# Patient Record
Sex: Male | Born: 1935 | Race: Black or African American | Hispanic: No | Marital: Married | Smoking: Never smoker
Health system: Southern US, Community
[De-identification: ages and names within clinical notes are randomized; demographics above are authoritative.]

## PROBLEM LIST (undated history)

## (undated) DIAGNOSIS — I10 Essential (primary) hypertension: Secondary | ICD-10-CM

## (undated) DIAGNOSIS — M109 Gout, unspecified: Secondary | ICD-10-CM

## (undated) DIAGNOSIS — Z789 Other specified health status: Secondary | ICD-10-CM

## (undated) DIAGNOSIS — E785 Hyperlipidemia, unspecified: Secondary | ICD-10-CM

## (undated) DIAGNOSIS — G4733 Obstructive sleep apnea (adult) (pediatric): Secondary | ICD-10-CM

## (undated) DIAGNOSIS — R7303 Prediabetes: Secondary | ICD-10-CM

## (undated) HISTORY — DX: Other specified health status: Z78.9

## (undated) HISTORY — DX: Gout, unspecified: M10.9

## (undated) HISTORY — PX: OTHER SURGICAL HISTORY: SHX169

## (undated) HISTORY — DX: Essential (primary) hypertension: I10

## (undated) HISTORY — DX: Prediabetes: R73.03

## (undated) HISTORY — DX: Hyperlipidemia, unspecified: E78.5

## (undated) HISTORY — PX: CATARACT EXTRACTION, BILATERAL: SHX1313

## (undated) HISTORY — DX: Obstructive sleep apnea (adult) (pediatric): G47.33

---

## 1998-03-02 HISTORY — PX: ROTATOR CUFF REPAIR: SHX139

## 1999-05-06 ENCOUNTER — Encounter: Admission: RE | Admit: 1999-05-06 | Discharge: 1999-05-21 | Payer: Self-pay | Admitting: Family Medicine

## 1999-09-09 ENCOUNTER — Encounter: Payer: Self-pay | Admitting: Surgery

## 1999-09-11 ENCOUNTER — Ambulatory Visit (HOSPITAL_COMMUNITY): Admission: RE | Admit: 1999-09-11 | Discharge: 1999-09-11 | Payer: Self-pay | Admitting: General Surgery

## 1999-09-11 ENCOUNTER — Encounter (INDEPENDENT_AMBULATORY_CARE_PROVIDER_SITE_OTHER): Payer: Self-pay | Admitting: Specialist

## 2005-03-02 HISTORY — PX: OTHER SURGICAL HISTORY: SHX169

## 2005-03-02 HISTORY — PX: CARPAL TUNNEL RELEASE: SHX101

## 2005-04-29 ENCOUNTER — Ambulatory Visit (HOSPITAL_COMMUNITY): Admission: RE | Admit: 2005-04-29 | Discharge: 2005-04-29 | Payer: Self-pay | Admitting: Internal Medicine

## 2005-06-16 ENCOUNTER — Ambulatory Visit (HOSPITAL_BASED_OUTPATIENT_CLINIC_OR_DEPARTMENT_OTHER): Admission: RE | Admit: 2005-06-16 | Discharge: 2005-06-16 | Payer: Self-pay | Admitting: Otolaryngology

## 2005-06-16 ENCOUNTER — Encounter (INDEPENDENT_AMBULATORY_CARE_PROVIDER_SITE_OTHER): Payer: Self-pay | Admitting: *Deleted

## 2005-10-09 ENCOUNTER — Ambulatory Visit (HOSPITAL_COMMUNITY): Admission: RE | Admit: 2005-10-09 | Discharge: 2005-10-09 | Payer: Self-pay | Admitting: Neurosurgery

## 2007-07-20 ENCOUNTER — Encounter: Admission: RE | Admit: 2007-07-20 | Discharge: 2007-07-20 | Payer: Self-pay | Admitting: Orthopedic Surgery

## 2007-09-05 ENCOUNTER — Ambulatory Visit: Payer: Self-pay | Admitting: Gastroenterology

## 2007-10-11 ENCOUNTER — Encounter: Payer: Self-pay | Admitting: Gastroenterology

## 2007-10-11 ENCOUNTER — Telehealth: Payer: Self-pay | Admitting: Gastroenterology

## 2007-10-12 ENCOUNTER — Telehealth: Payer: Self-pay | Admitting: Gastroenterology

## 2007-10-13 ENCOUNTER — Encounter: Payer: Self-pay | Admitting: Gastroenterology

## 2007-10-13 ENCOUNTER — Ambulatory Visit: Payer: Self-pay | Admitting: Gastroenterology

## 2007-10-13 LAB — HM COLONOSCOPY

## 2007-10-18 ENCOUNTER — Encounter: Payer: Self-pay | Admitting: Gastroenterology

## 2008-07-25 ENCOUNTER — Encounter: Admission: RE | Admit: 2008-07-25 | Discharge: 2008-07-25 | Payer: Self-pay | Admitting: Internal Medicine

## 2009-02-12 ENCOUNTER — Encounter: Admission: RE | Admit: 2009-02-12 | Discharge: 2009-02-12 | Payer: Self-pay | Admitting: Orthopedic Surgery

## 2010-07-18 NOTE — Op Note (Signed)
[facility]  Patient:    Jeremy Boone, Jeremy Boone                      MRN: [id number] Proc. Date: 09/11/99 Adm. Date:  [id number] Attending:  Sonda Primes CC:         Mardene Celeste. Lurene Shadow, M.D. (2)                           Operative Report  PREOPERATIVE DIAGNOSIS:  Lipoma, right flank.  POSTOPERATIVE DIAGNOSIS:  Lipoma, right flank.  OPERATION PERFORMED:  Excision, lipoma, right flank.  SURGEON:  Mardene Celeste. Lurene Shadow, M.D.  ASSISTANT:  Nurse.  ANESTHESIA:  General.  INDICATIONS FOR PROCEDURE:  The patient is a 76 year old with a large and enlarging lipoma just over the posterior iliac crest.  It has been enlarging. The patient has been unable to get his clothing fitting correctly.  On clinical measurement, the lipoma measured about 12 x 9 cm in greatest diameters.  He is brought to the operating room now for excision.  DESCRIPTION OF PROCEDURE:  Following the induction of anesthesia, the patient was positioned supinely and then moved to the lateral recumbent position with the right side up.  The right flank was prepped and draped to be included in the sterile operative field.  I made a transverse incision over the mass above the iliac crest, deepened this through the skin and subcutaneous tissue, carrying the dissection down to the capsule of the mass.  The mass was multilobulated and multiple interstices were dissected free, carrying the dissection down to the flank musculature.  All of this was removed in its entirety and forwarded for pathologic evaluation measuring approximately 15 x 12 cm.  Hemostasis was assured with electrocautery and clamps and ties of 2-0 silk.  Subcutaneous tissues were closed with interrupted 3-0 Vicryl sutures.  Skin closed with a 4-0 Monocryl suture and then reinforced with Steri-Strips.  A sterile dressing was applied.  Anesthetic reversed.  Patient removed from the operating room to the recovery room in stable  condition having tolerated the procedure well. DD:  09/11/99 TD:  09/11/99 Job: [id number]

## 2010-07-18 NOTE — Op Note (Signed)
NAMEJESSE, NOSBISCH             ACCOUNT NO.:  [id number]   MEDICAL RECORD NO.:  [id number]          PATIENT TYPE:  AMB   LOCATION:  SDS                          FACILITY:  [facility]   PHYSICIAN:  Danae Orleans. Venetia Maxon, M.D.  DATE OF BIRTH:  03/27/1934   DATE OF PROCEDURE:  10/09/2005  DATE OF DISCHARGE:                                 OPERATIVE REPORT   PREOPERATIVE DIAGNOSIS:  Left carpal tunnel syndrome.   POSTOPERATIVE DIAGNOSIS:  Left carpal tunnel syndrome.   PROCEDURE:  Left carpal tunnel release.   SURGEON:  Danae Orleans. Venetia Maxon, M.D.   ANESTHESIA:  IV sedation with local anesthetic.   ESTIMATED BLOOD LOSS:  Minimal.   COMPLICATIONS:  None.   DISPOSITION:  Recovery.   INDICATIONS:  Jeremy Boone is a 76 year old mason with cervical  radiculopathy also fairly profound left carpal tunnel syndrome documented by  electrodiagnostic studies.  It was elected to take him to surgery for left  carpal tunnel release.   PROCEDURE:  Mr. Fitzmaurice was brought to the operating room.  He was given  intravenous sedation, and then his left hand and forearm were then prepped  and draped in the usual sterile fashion with Betadine scrub and paint in a  sterile stockinette and extremity drape.  His skin was marked from the  distal wrist crease proximal to the palm approximately 3 cm, and this was  infiltrated with local lidocaine on line with the fourth ray.  Incision was  made, carried through subcutaneous fat to the flexor retinaculum which was  then incised.  The flexor red retinaculum was opened both distally and more  proximally along the volar wrist into the forearm.  There appeared to be  some hypertrophy of the ligament but also some suggestion of tenosynovitis  with tissue overlying the median nerve with some inflammatory tissue  overlying the nerve.  It was felt that the median nerve was well  decompressed at this point.  Hemostasis ensured with bipolar electrocautery.  Wound was irrigated  and then closed with interrupted 3-0 nylon vertical  mattress stitches.  Sterile occlusive dressing was placed with bacitracin,  Telfa, gauze fluff, Kerlix and  Kling wrap.  At the end of surgery, the patient felt that he had more  feeling in his fingers.  He was moving his hand well with good opposition.  He was taken to recovery in stable and satisfactory condition, having  tolerated the procedure well.  Counts were correct at the end of the case.      Danae Orleans. Venetia Maxon, M.D.  Electronically Signed     JDS/MEDQ  D:  10/09/2005  T:  10/09/2005  Job:  [id number]

## 2010-07-18 NOTE — Op Note (Signed)
NAMEDELMORE, SEAR             ACCOUNT NO.:  [id number]   MEDICAL RECORD NO.:  [id number]          PATIENT TYPE:  AMB   LOCATION:  DSC                          FACILITY:  [facility]   PHYSICIAN:  Christopher E. Ezzard Standing, M.D.DATE OF BIRTH:  1934-09-19   DATE OF PROCEDURE:  06/16/2005  DATE OF DISCHARGE:                                 OPERATIVE REPORT   PREOPERATIVE DIAGNOSIS:  Right tonsil mass.   POSTOPERATIVE DIAGNOSIS:  Right tonsil mass.   OPERATION:  Direct laryngoscopy with excision of right tonsil mass.   SURGEON:  Kristine Garbe. Ezzard Standing, M.D.   ANESTHESIA:  General endotracheal.   COMPLICATIONS:  None.   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  He was referred to the office for further evaluation of the base of tongue  mass and on examination patient has a polypoid mass arising from the medial  inferior aspect of the right tonsil.  He is taken to the operating room at  this time for direct laryngoscopy and excision of right tonsil mass.   DESCRIPTION OF PROCEDURE:  After adequate endotracheal anesthesia, a direct  laryngoscopy was performed.  There was a polypoid mass measuring  approximately 2 to 2.5 cm arising from the inferior medial aspect of the  right tonsil.  The left tonsil appeared benign, base of tongue was clear.  Vallecula, epiglottis were normal.  Vocal cords, A-E folds and piriform  sinuses were all normal to evaluation.  After direct laryngoscopy, a mouth  was used to expose the oropharynx.  The medial aspect of the right tonsil  was excised with cautery.  The stalk of the mass arose from the medial  aspect of the tonsil and this was sent to pathology.  The mass measured  approximately 2.5 cm in size, had a thin stalk attached to the medial aspect  of the right tonsil.  Hemostasis  was obtained with cautery.  This completed  the procedure.  Palacios was awoken from anesthesia and transferred to the  recovery room postop doing well.   DISPOSITION:  Mcgregor is discharged home later this morning on amoxicillin  suspension 400 mg b.i.d. for 1 week, Tylenol, Lortab Elixir 1 tbsp q.4h.  p.r.n. pain.  Will have him followup in my office in 1 week for recheck and  review pathology.           ______________________________  Kristine Garbe Ezzard Standing, M.D.     CEN/MEDQ  D:  06/16/2005  T:  06/16/2005  Job:  [id number]   cc:   [name], M.D.  Fax: [phone]

## 2013-03-06 DIAGNOSIS — E782 Mixed hyperlipidemia: Secondary | ICD-10-CM | POA: Insufficient documentation

## 2013-03-06 DIAGNOSIS — M109 Gout, unspecified: Secondary | ICD-10-CM | POA: Insufficient documentation

## 2013-03-06 DIAGNOSIS — M1000 Idiopathic gout, unspecified site: Secondary | ICD-10-CM | POA: Insufficient documentation

## 2013-03-06 DIAGNOSIS — R7309 Other abnormal glucose: Secondary | ICD-10-CM | POA: Insufficient documentation

## 2013-03-06 DIAGNOSIS — I10 Essential (primary) hypertension: Secondary | ICD-10-CM | POA: Insufficient documentation

## 2013-03-06 DIAGNOSIS — E785 Hyperlipidemia, unspecified: Secondary | ICD-10-CM

## 2013-03-07 ENCOUNTER — Ambulatory Visit (INDEPENDENT_AMBULATORY_CARE_PROVIDER_SITE_OTHER): Payer: Medicare Other | Admitting: Physician Assistant

## 2013-03-07 ENCOUNTER — Encounter: Payer: Self-pay | Admitting: Physician Assistant

## 2013-03-07 VITALS — BP 122/72 | HR 62 | Temp 98.1°F | Resp 16 | Wt 186.0 lb

## 2013-03-07 DIAGNOSIS — E785 Hyperlipidemia, unspecified: Secondary | ICD-10-CM

## 2013-03-07 DIAGNOSIS — I10 Essential (primary) hypertension: Secondary | ICD-10-CM

## 2013-03-07 DIAGNOSIS — Z79899 Other long term (current) drug therapy: Secondary | ICD-10-CM

## 2013-03-07 DIAGNOSIS — E782 Mixed hyperlipidemia: Secondary | ICD-10-CM

## 2013-03-07 DIAGNOSIS — Z23 Encounter for immunization: Secondary | ICD-10-CM

## 2013-03-07 DIAGNOSIS — M109 Gout, unspecified: Secondary | ICD-10-CM

## 2013-03-07 DIAGNOSIS — R7309 Other abnormal glucose: Secondary | ICD-10-CM

## 2013-03-07 DIAGNOSIS — R7303 Prediabetes: Secondary | ICD-10-CM

## 2013-03-07 MED ORDER — HYDROCHLOROTHIAZIDE 25 MG PO TABS: 25.0000 mg | ORAL_TABLET | Freq: Every day | ORAL | Status: DC

## 2013-03-07 MED ORDER — ATENOLOL 100 MG PO TABS: 100.0000 mg | ORAL_TABLET | Freq: Every day | ORAL | Status: DC

## 2013-03-07 NOTE — Patient Instructions (Signed)

## 2013-03-07 NOTE — Progress Notes (Signed)
HPI Patient presents for 3 month follow up with hypertension, hyperlipidemia, prediabetes and vitamin D. Patient's blood pressure has been controlled at home, today their BP is BP: 122/72 mmHg  Patient denies chest pain, shortness of breath, dizziness.  Patient's cholesterol is diet controlled. The cholesterol last visit was LDL 138, Trig 155.  The patient has been working on diet and exercise for prediabetes, and denies changes in vision, polys, and paresthesias. A1C 6.1.  Patient is on Vitamin D supplement.  Vitamin D 55.  Current Medications:  Current Outpatient Prescriptions on File Prior to Visit  Medication Sig Dispense Refill  . allopurinol (ZYLOPRIM) 300 MG tablet Take 300 mg by mouth daily. 1/2 tablet      . atenolol (TENORMIN) 100 MG tablet Take 100 mg by mouth daily.      . Cholecalciferol (VITAMIN D PO) Take 5,000 Int'l Units by mouth daily.      . clonazePAM (KLONOPIN) 2 MG tablet Take 2 mg by mouth daily. 1/2 to 1  tablet at bedtime      . Flaxseed, Linseed, (FLAX SEED OIL PO) Take by mouth daily.      . hydrochlorothiazide (HYDRODIURIL) 25 MG tablet Take 25 mg by mouth daily.      . Omega-3 Fatty Acids (FISH OIL PO) Take by mouth daily.      . Sildenafil Citrate (VIAGRA PO) Take by mouth as needed.       No current facility-administered medications on file prior to visit.   Medical History:  Past Medical History  Diagnosis Date  . Hyperlipidemia   . Hypertension   . Gout   . Prediabetes    Allergies:  Allergies  Allergen Reactions  . Ace Inhibitors Cough  . Lipitor [Atorvastatin]     Elevates CPK and Aldolase    ROS Constitutional: Denies fever, chills, headaches, insomnia, fatigue, night sweats Eyes: Denies redness, blurred vision, diplopia, discharge, itchy, watery eyes.  ENT: Denies congestion, post nasal drip, sore throat, earache, dental pain, Tinnitus, Vertigo, Sinus pain, snoring.  Cardio: Denies chest pain, palpitations, irregular heartbeat, dyspnea,  diaphoresis, orthopnea, PND, claudication, edema Respiratory: denies cough, shortness of breath, wheezing.  Gastrointestinal: + GERD worse with fatty foods and hot peppers Denies dysphagia, heartburn, AB pain/ cramps, N/V, diarrhea, constipation, hematemesis, melena, hematochezia,  hemorrhoids Genitourinary: Denies dysuria, frequency, urgency, nocturia, hesitancy, discharge, hematuria, flank pain Musculoskeletal: Denies myalgia, stiffness, pain, swelling and strain/sprain. Skin: Denies pruritis, rash, changing in skin lesion Neuro: Denies Weakness, tremor, incoordination, spasms, pain Psychiatric: Denies confusion, memory loss, sensory loss Endocrine: Denies change in weight, skin, hair change, nocturia Diabetic Polys, Denies visual blurring, hyper /hypo glycemic episodes, and paresthesia, Heme/Lymph: Denies Excessive bleeding, bruising, enlarged lymph nodes  Family history- Review and unchanged Social history- Review and unchanged Physical Exam: Filed Vitals:   03/07/13 1715  BP: 122/72  Pulse: 62  Temp: 98.1 F (36.7 C)  Resp: 16   Filed Weights   03/07/13 1715  Weight: 186 lb (84.369 kg)   General Appearance: Well nourished, in no apparent distress. Eyes: PERRLA, EOMs, conjunctiva no swelling or erythema Sinuses: No Frontal/maxillary tenderness ENT/Mouth: Ext aud canals clear, TMs without erythema, bulging. No erythema, swelling, or exudate on post pharynx.  Tonsils not swollen or erythematous. Hearing normal.  Neck: Supple, thyroid normal.  Respiratory: Respiratory effort normal, BS equal bilaterally without rales, rhonchi, wheezing or stridor.  Cardio: RRR with no MRGs. Brisk peripheral pulses without edema.  Abdomen: Soft, + BS.  Non tender, no guarding, rebound,  hernias, masses. Lymphatics: Non tender without lymphadenopathy.  Musculoskeletal: Full ROM, 5/5 strength, normal gait.  Skin: Warm, dry without rashes, lesions, ecchymosis.  Neuro: Cranial nerves intact. Normal  muscle tone, no cerebellar symptoms. Sensation intact.  Psych: Awake and oriented X 3, normal affect, Insight and Judgment appropriate.   Assessment and Plan:  Hypertension: Continue medication, monitor blood pressure at home.  Continue DASH diet. Cholesterol: Continue diet and exercise. Check cholesterol.  Pre-diabetes-Continue diet and exercise. Check A1C Vitamin D Def- check level and continue medications.  GERD - will give print out, if it gets worse get PPI/follow up in office.   Continue diet and meds as discussed. Further disposition pending results of labs.  Quentin Mullingollier, Stayce Delancy 5:16 PM

## 2013-03-08 LAB — HEMOGLOBIN A1C
Hgb A1c MFr Bld: 6.3 % — ABNORMAL HIGH (ref <5.7)
Mean Plasma Glucose: 134 mg/dL — ABNORMAL HIGH (ref <117)

## 2013-03-08 LAB — CBC WITH DIFFERENTIAL/PLATELET
BASOS ABS: 0.1 10*3/uL (ref 0.0–0.1)
BASOS PCT: 1 % (ref 0–1)
Eosinophils Absolute: 0.6 10*3/uL (ref 0.0–0.7)
Eosinophils Relative: 10 % — ABNORMAL HIGH (ref 0–5)
HEMATOCRIT: 44.1 % (ref 39.0–52.0)
HEMOGLOBIN: 15.1 g/dL (ref 13.0–17.0)
LYMPHS PCT: 45 % (ref 12–46)
Lymphs Abs: 2.5 10*3/uL (ref 0.7–4.0)
MCH: 30.6 pg (ref 26.0–34.0)
MCHC: 34.2 g/dL (ref 30.0–36.0)
MCV: 89.3 fL (ref 78.0–100.0)
MONO ABS: 0.6 10*3/uL (ref 0.1–1.0)
MONOS PCT: 11 % (ref 3–12)
NEUTROS ABS: 1.8 10*3/uL (ref 1.7–7.7)
NEUTROS PCT: 33 % — AB (ref 43–77)
Platelets: 254 10*3/uL (ref 150–400)
RBC: 4.94 MIL/uL (ref 4.22–5.81)
RDW: 14.2 % (ref 11.5–15.5)
WBC: 5.5 10*3/uL (ref 4.0–10.5)

## 2013-03-08 LAB — HEPATIC FUNCTION PANEL
ALK PHOS: 76 U/L (ref 39–117)
ALT: 13 U/L (ref 0–53)
AST: 25 U/L (ref 0–37)
Albumin: 4.3 g/dL (ref 3.5–5.2)
BILIRUBIN DIRECT: 0.1 mg/dL (ref 0.0–0.3)
BILIRUBIN INDIRECT: 0.3 mg/dL (ref 0.0–0.9)
BILIRUBIN TOTAL: 0.4 mg/dL (ref 0.3–1.2)
Total Protein: 7.3 g/dL (ref 6.0–8.3)

## 2013-03-08 LAB — BASIC METABOLIC PANEL WITH GFR
BUN: 22 mg/dL (ref 6–23)
CHLORIDE: 100 meq/L (ref 96–112)
CO2: 28 mEq/L (ref 19–32)
Calcium: 9.5 mg/dL (ref 8.4–10.5)
Creat: 0.97 mg/dL (ref 0.50–1.35)
GFR, EST AFRICAN AMERICAN: 87 mL/min
GFR, EST NON AFRICAN AMERICAN: 76 mL/min
Glucose, Bld: 93 mg/dL (ref 70–99)
POTASSIUM: 3.7 meq/L (ref 3.5–5.3)
SODIUM: 137 meq/L (ref 135–145)

## 2013-03-08 LAB — MAGNESIUM: MAGNESIUM: 2 mg/dL (ref 1.5–2.5)

## 2013-03-08 LAB — LIPID PANEL
CHOL/HDL RATIO: 3.8 ratio
Cholesterol: 251 mg/dL — ABNORMAL HIGH (ref 0–200)
HDL: 66 mg/dL (ref >39)
LDL CALC: 161 mg/dL — AB (ref 0–99)
TRIGLYCERIDES: 120 mg/dL (ref <150)
VLDL: 24 mg/dL (ref 0–40)

## 2013-03-08 LAB — TSH: TSH: 3.746 u[IU]/mL (ref 0.350–4.500)

## 2013-06-05 ENCOUNTER — Ambulatory Visit: Payer: Self-pay | Admitting: Emergency Medicine

## 2013-06-08 ENCOUNTER — Ambulatory Visit (INDEPENDENT_AMBULATORY_CARE_PROVIDER_SITE_OTHER): Payer: Medicare Other | Admitting: Emergency Medicine

## 2013-06-08 ENCOUNTER — Encounter: Payer: Self-pay | Admitting: Emergency Medicine

## 2013-06-08 VITALS — BP 126/68 | HR 66 | Temp 98.2°F | Resp 16 | Ht 71.5 in | Wt 185.0 lb

## 2013-06-08 DIAGNOSIS — Z789 Other specified health status: Secondary | ICD-10-CM

## 2013-06-08 DIAGNOSIS — E559 Vitamin D deficiency, unspecified: Secondary | ICD-10-CM

## 2013-06-08 DIAGNOSIS — Z0000 Encounter for general adult medical examination without abnormal findings: Secondary | ICD-10-CM

## 2013-06-08 DIAGNOSIS — R7309 Other abnormal glucose: Secondary | ICD-10-CM

## 2013-06-08 DIAGNOSIS — Z1331 Encounter for screening for depression: Secondary | ICD-10-CM

## 2013-06-08 DIAGNOSIS — R1310 Dysphagia, unspecified: Secondary | ICD-10-CM

## 2013-06-08 DIAGNOSIS — E782 Mixed hyperlipidemia: Secondary | ICD-10-CM

## 2013-06-08 DIAGNOSIS — I10 Essential (primary) hypertension: Secondary | ICD-10-CM

## 2013-06-08 LAB — CBC WITH DIFFERENTIAL/PLATELET
BASOS PCT: 1 % (ref 0–1)
Basophils Absolute: 0.1 10*3/uL (ref 0.0–0.1)
EOS ABS: 0.6 10*3/uL (ref 0.0–0.7)
Eosinophils Relative: 11 % — ABNORMAL HIGH (ref 0–5)
HCT: 42.1 % (ref 39.0–52.0)
Hemoglobin: 14.4 g/dL (ref 13.0–17.0)
LYMPHS ABS: 2.5 10*3/uL (ref 0.7–4.0)
Lymphocytes Relative: 48 % — ABNORMAL HIGH (ref 12–46)
MCH: 30.5 pg (ref 26.0–34.0)
MCHC: 34.2 g/dL (ref 30.0–36.0)
MCV: 89.2 fL (ref 78.0–100.0)
Monocytes Absolute: 0.5 10*3/uL (ref 0.1–1.0)
Monocytes Relative: 9 % (ref 3–12)
NEUTROS ABS: 1.6 10*3/uL — AB (ref 1.7–7.7)
NEUTROS PCT: 31 % — AB (ref 43–77)
PLATELETS: 251 10*3/uL (ref 150–400)
RBC: 4.72 MIL/uL (ref 4.22–5.81)
RDW: 14.3 % (ref 11.5–15.5)
WBC: 5.3 10*3/uL (ref 4.0–10.5)

## 2013-06-08 LAB — HEMOGLOBIN A1C
HEMOGLOBIN A1C: 6.1 % — AB (ref <5.7)
MEAN PLASMA GLUCOSE: 128 mg/dL — AB (ref <117)

## 2013-06-08 NOTE — Progress Notes (Signed)
Patient ID: Jeremy Boone, male   DOB: 12-23-1934, 79 y.o.   MRN: [id number] Subjective:  Jeremy JewsFitzroy Millar is a 79 y.o. male who presents for Medicare Annual Wellness Visit and 3 month follow up for HTN, hyperlipidemia, prediabetes, and vitamin D Def.  Date of last medicare wellness visit was is unknown.  He has noted mild increased difficulty with choking. He denies specific food triggers and notes feels like trouble is in upper esophagus. He always has hoarse voice and denies increase in reflux.   His blood pressure has been controlled at home, today their BP is BP: 126/68 mmHg He does workout. He denies chest pain, shortness of breath, dizziness.  He is not on cholesterol medication and denies myalgias. His cholesterol is not at goal. The cholesterol last visit was:   Lab Results  Component Value Date   CHOL 219* 06/08/2013   HDL 68 06/08/2013   LDLCALC 409130* 06/08/2013   TRIG 104 06/08/2013   CHOLHDL 3.2 06/08/2013   He has been working on diet and exercise for prediabetes, and denies polydipsia and polyuria. Last A1C in the office was:  Lab Results  Component Value Date   HGBA1C 6.1* 06/08/2013   Patient is on Vitamin D supplement.   No components found with this basename: VITD25OH     Names of Other Physician/Practitioners you currently use: Patient Care Team: Lucky CowboyWilliam McKeown, MD as PCP - General (Internal Medicine) Lamar Benesharles R Epes, MD as Consulting Physician (Ophthalmology) Drucilla SchmidtJames P Aplington, MD as Consulting Physician (Orthopedic Surgery) Louis Meckelobert D Kaplan, MD as Consulting Physician (Gastroenterology)  Medication Review: Current Outpatient Prescriptions on File Prior to Visit  Medication Sig Dispense Refill  . allopurinol (ZYLOPRIM) 300 MG tablet Take 300 mg by mouth daily. 1/2 tablet      . atenolol (TENORMIN) 100 MG tablet Take 1 tablet (100 mg total) by mouth daily.  90 tablet  1  . Cholecalciferol (VITAMIN D PO) Take 5,000 Int'l Units by mouth daily.      . clonazePAM (KLONOPIN)  2 MG tablet Take 2 mg by mouth daily. 1/2 to 1  tablet at bedtime      . Flaxseed, Linseed, (FLAX SEED OIL PO) Take by mouth daily.      . hydrochlorothiazide (HYDRODIURIL) 25 MG tablet Take 1 tablet (25 mg total) by mouth daily.  90 tablet  1  . Omega-3 Fatty Acids (FISH OIL PO) Take by mouth daily.      . Sildenafil Citrate (VIAGRA PO) Take by mouth as needed.       No current facility-administered medications on file prior to visit.   Allergies  Allergen Reactions  . Ace Inhibitors Cough  . Lipitor [Atorvastatin]     Elevates CPK and Aldolase    Current Problems (verified) Patient Active Problem List   Diagnosis Date Noted  . Hyperlipidemia   . Hypertension   . Gout   . Prediabetes     Screening Tests Health Maintenance  Topic Date Due  . Zostavax  03/18/1996  . Influenza Vaccine  09/30/2013  . Tetanus/tdap  03/02/2014  . Colonoscopy  10/12/2017  . Pneumococcal Polysaccharide Vaccine Age 77 And Over  Completed    Immunization History  Administered Date(s) Administered  . Influenza,inj,Quad PF,36+ Mos 03/07/2013  . Pneumococcal-Unspecified 03/03/2007  . Td 03/02/2004    Preventative care: Last colonoscopy: 8/13/09DUE 2019, polyps, diverticulosis CXR: 2007  Prior vaccinations: TD or Tdap: 2006 Influenza: 03/07/13 Pneumococcal: 2009 Shingles/Zostavax: Declines due to cost   History reviewed:  Past Medical History  Diagnosis Date  . Hyperlipidemia   . Hypertension   . Gout   . Prediabetes    Past Surgical History  Procedure Laterality Date  . Carpal tunnel release Left 2007  . Rotator cuff repair Left 2000  . Other surgical history  2007    Negative biopsy tonsil mass   History  Substance Use Topics  . Smoking status: Never Smoker   . Smokeless tobacco: Never Used  . Alcohol Use: Yes   Family History  Problem Relation Age of Onset  . Hypertension Mother   . Stroke Father   . Seizures Sister     Risk Factors: Tobacco History  Substance  Use Topics  . Smoking status: Never Smoker   . Smokeless tobacco: Never Used  . Alcohol Use: Yes   He does not smoke.  Patient is not a former smoker. Are there smokers in your home (other than you)?  No  Alcohol Current alcohol use: 2 liquor drinks per day(s)  Caffeine Current caffeine use: coffee 2 /day  Exercise Current exercise habits: Home exercise routine includes walking .5 hrs per day.  Current exercise: housecleaning, walking and yard work  Nutrition/Diet Current diet: in general, a "healthy" diet    Cardiac risk factors: advanced age (older than 34 for men, 87 for women), hypertension and male gender.  Depression Screen Nurse depression screen reviewed.  (Note: if answer to either of the following is "Yes", a more complete depression screening is indicated)   Q1: Over the past two weeks, have you felt down, depressed or hopeless? No  Q2: Over the past two weeks, have you felt little interest or pleasure in doing things? No  Have you lost interest or pleasure in daily life? No  Do you often feel hopeless? No  Do you cry easily over simple problems? No  Activities of Daily Living Nurse ADLs screen reviewed.  In your present state of health, do you have any difficulty performing the following activities?:  Driving? No Managing money?  No Feeding yourself? No Getting from bed to chair? No Climbing a flight of stairs? No Preparing food and eating?: No Bathing or showering? No Getting dressed: No Getting to the toilet? No Using the toilet:No Moving around from place to place: No In the past year have you fallen or had a near fall?:No   Are you sexually active?  Yes  Do you have more than one partner?  No  Vision Difficulties: No  Hearing Difficulties: No Do you often ask people to speak up or repeat themselves? No Do you experience ringing or noises in your ears? No Do you have difficulty understanding soft or whispered voices? No  Cognition  Do you  feel that you have a problem with memory?No  Do you often misplace items? No  Do you feel safe at home?  Yes  Advanced directives Does patient have a Health Care Power of Attorney? No Does patient have a Living Will? No   Objective:     Vision and hearing screens reviewed.   Blood pressure 126/68, pulse 66, temperature 98.2 F (36.8 C), temperature source Temporal, resp. rate 16, height 5' 11.5" (1.816 m), weight 185 lb (83.915 kg). Body mass index is 25.45 kg/(m^2).  General appearance: alert, no distress, WD/WN, male Cognitive Testing  Alert? Yes  Normal Appearance?Yes  Oriented to person? Yes  Place? Yes   Time? Yes  Recall of three objects?  Yes  Can perform simple calculations? Yes  Displays appropriate judgment?Yes  Can read the correct time from a watch face?Yes  HEENT: normocephalic, sclerae anicteric, TMs pearly, nares patent, no discharge or erythema, pharynx normal Oral cavity: MMM, no lesions Neck: supple, no lymphadenopathy, no thyromegaly, no masses Heart: RRR, normal S1, S2, no murmurs Lungs: CTA bilaterally, no wheezes, rhonchi, or rales Abdomen: +bs, soft, non tender, non distended, no masses, no hepatomegaly, no splenomegaly Musculoskeletal: nontender, no swelling, no obvious deformity Extremities: no edema, no cyanosis, no clubbing Pulses: 2+ symmetric, upper and lower extremities, normal cap refill Skin: Exposed area WNL Neurological: alert, oriented x 3, CN2-12 intact, strength normal upper extremities and lower extremities, sensation normal throughout, DTRs 2+ throughout, no cerebellar signs, gait normal Psychiatric: normal affect, behavior normal, pleasant   Assessment:  1.Medicare Wellness update- Update screening labs/ History/ Immunizations/ Testing as needed. Advised healthy diet, QD exercise, increase H20 and continue RX/ Vitamins AD.  2. 3 month F/U for HTN, Cholesterol, Pre-Dm, D. Deficient. Needs healthy diet, cardio QD and obtain healthy  weight. Check Labs, Check BP if >130/80 call office  3.Dysphagia- REF GI, follow GERD Diet, no ETOH   Plan:   During the course of the visit the patient was educated and counseled about appropriate screening and preventive services including:    Nutrition counseling   Screening recommendations, referrals: ALL FOLLOWING UP TO DATE OR DECLINES  Vaccinations: Tdap vaccine no  Influenza vaccine no Pneumococcal vaccine no Shingles vaccine no Hep B vaccine no  Nutrition assessed and recommended  Colonoscopy no Recommended yearly ophthalmology/optometry visit for glaucoma screening and checkup Recommended yearly dental visit for hygiene and checkup Advanced directives - yes  Conditions/risks identified: BMI: Discussed weight loss, diet, and increase physical activity.  Increase physical activity: AHA recommends 150 minutes of physical activity a week.  Medications reviewed Diabetes is at goal, ACE/ARB therapy: No, Reason not on Ace Inhibitor/ARB therapy:  IS not a diabetic Urinary Incontinence is not an issue: discussed non pharmacology and pharmacology options.  Fall risk: low- discussed PT, home fall assessment, medications.    Medicare Attestation I have personally reviewed: The patient's medical and social history Their use of alcohol, tobacco or illicit drugs Their current medications and supplements The patient's functional ability including ADLs,fall risks, home safety risks, cognitive, and hearing and visual impairment Diet and physical activities Evidence for depression or mood disorders  The patient's weight, height, BMI, and visual acuity have been recorded in the chart.  I have made referrals, counseling, and provided education to the patient based on review of the above and I have provided the patient with a written personalized care plan for preventive services.     Berenice Primas, PA-C   06/12/2013   CPT Z6109 first AWV CPT [id number] subsequent AWV

## 2013-06-08 NOTE — Patient Instructions (Signed)
Bad carbs also include fruit juice, alcohol, and sweet tea. These are empty calories that do not signal to your brain that you are full.   Please remember the good carbs are still carbs which convert into sugar. So please measure them out no more than 1/2-1 cup of rice, oatmeal, pasta, and beans.  Veggies are however free foods! Pile them on.   I like lean protein at every meal such as chicken, [place], pork chops, cottage cheese, etc. Just do not fry these meats and please center your meal around vegetable, the meats should be a side dish.   No all fruit is created equal. Please see the list below, the fruit at the bottom is higher in sugars than the fruit at the top   We want weight loss that will last so you should lose 1-2 pounds a week.  THAT IS IT! Please pick THREE things a month to change. Once it is a habit check off the item. Then pick another three items off the list to become habits.  If you are already doing a habit on the list GREAT!  Cross that item off! o Don't drink your calories. Ie, alcohol, soda, fruit juice, and sweet tea.  o Drink more water. Drink a glass when you feel hungry or before each meal.  o Eat breakfast - Complex carb and protein (likeDannon light and fit yogurt, oatmeal, fruit, eggs, [place] bacon). o Measure your cereal.  Eat no more than one cup a day. (ie [place]) o Eat an apple a day. o Add a vegetable a day. o Try a new vegetable a month. o Use Pam! Stop using oil or butter to cook. o Don't finish your plate or use smaller plates. o Share your dessert. o Eat sugar free Jello for dessert or frozen grapes. o Don't eat 2-3 hours before bed. o Switch to whole wheat bread, pasta, and brown rice. o Make healthier choices when you eat out. No fries! o Pick baked chicken, NOT fried. o Don't forget to SLOW DOWN when you eat. It is not going anywhere.  o Take the stairs. o Park far away in the parking lot o [employer] (or weights) for 10 minutes while  watching TV. o Walk at work for 10 minutes during break. o Walk outside 1 time a week with your friend, kids, dog, or significant other. o Start a walking group at church. o Walk the mall as much as you can tolerate.  o Keep a food diary. o Weigh yourself daily. o Walk for 15 minutes 3 days per week. o Cook at home more often and eat out less.  If life happens and you go back to old habits, it is okay.  Just start over. You can do it!   If you experience chest pain, get short of breath, or tired during the exercise, please stop immediately and inform your doctor.  Diet for Gastroesophageal Reflux Disease, Adult Reflux is when stomach acid flows up into the esophagus. The esophagus becomes irritated and sore (inflammation). When reflux happens often and is severe, it is called gastroesophageal reflux disease (GERD). What you eat can help ease any discomfort caused by GERD. FOODS OR DRINKS TO AVOID OR LIMIT  Coffee and black tea, with or without caffeine.  Bubbly (carbonated) drinks with caffeine or energy drinks.  Strong spices, such as pepper, cayenne pepper, curry, or chili powder.  Peppermint or spearmint.  Chocolate.  High-fat foods, such as meats, fried food,  oils, butter, or nuts.  Fruits and vegetables that cause discomfort. This includes citrus fruits and tomatoes.  Alcohol. If a certain food or drink irritates your GERD, avoid eating or drinking it. THINGS THAT MAY HELP GERD INCLUDE:  Eat meals slowly.  Eat 5 to 6 small meals a day, not 3 large meals.  Do not eat food for a certain amount of time if it causes discomfort.  Wait 3 hours after eating before lying down.  Keep the head of your bed raised 6 to 9 inches (15 23 centimeters). Put a foam wedge or blocks under the legs of the bed.  Stay active. Weight loss, if needed, may help ease your discomfort.  Wear loose-fitting clothing.  Do not smoke or chew tobacco. Document Released: 08/18/2011 Document  Reviewed: 08/18/2011 [facility] Patient Information 2014 [place], [place].

## 2013-06-09 LAB — HEPATIC FUNCTION PANEL
ALBUMIN: 3.8 g/dL (ref 3.5–5.2)
ALK PHOS: 74 U/L (ref 39–117)
ALT: 14 U/L (ref 0–53)
AST: 30 U/L (ref 0–37)
BILIRUBIN INDIRECT: 0.3 mg/dL (ref 0.2–1.2)
Bilirubin, Direct: 0.1 mg/dL (ref 0.0–0.3)
TOTAL PROTEIN: 7 g/dL (ref 6.0–8.3)
Total Bilirubin: 0.4 mg/dL (ref 0.2–1.2)

## 2013-06-09 LAB — BASIC METABOLIC PANEL WITH GFR
BUN: 17 mg/dL (ref 6–23)
CALCIUM: 9.3 mg/dL (ref 8.4–10.5)
CO2: 28 meq/L (ref 19–32)
Chloride: 97 mEq/L (ref 96–112)
Creat: 0.95 mg/dL (ref 0.50–1.35)
GFR, Est African American: 89 mL/min
GFR, Est Non African American: 77 mL/min
Glucose, Bld: 94 mg/dL (ref 70–99)
POTASSIUM: 3.6 meq/L (ref 3.5–5.3)
SODIUM: 135 meq/L (ref 135–145)

## 2013-06-09 LAB — MAGNESIUM: Magnesium: 1.8 mg/dL (ref 1.5–2.5)

## 2013-06-09 LAB — LIPID PANEL
Cholesterol: 219 mg/dL — ABNORMAL HIGH (ref 0–200)
HDL: 68 mg/dL (ref >39)
LDL CALC: 130 mg/dL — AB (ref 0–99)
Total CHOL/HDL Ratio: 3.2 Ratio
Triglycerides: 104 mg/dL (ref <150)
VLDL: 21 mg/dL (ref 0–40)

## 2013-06-09 LAB — VITAMIN D 25 HYDROXY (VIT D DEFICIENCY, FRACTURES): VIT D 25 HYDROXY: 100 ng/mL — AB (ref 30–89)

## 2013-06-09 LAB — INSULIN, FASTING: INSULIN FASTING, SERUM: 16 u[IU]/mL (ref 3–28)

## 2013-06-13 ENCOUNTER — Encounter: Payer: Self-pay | Admitting: Gastroenterology

## 2013-08-02 ENCOUNTER — Ambulatory Visit (INDEPENDENT_AMBULATORY_CARE_PROVIDER_SITE_OTHER): Payer: Medicare Other | Admitting: Gastroenterology

## 2013-08-02 ENCOUNTER — Encounter: Payer: Self-pay | Admitting: Gastroenterology

## 2013-08-02 VITALS — BP 122/70 | HR 64 | Ht 71.5 in | Wt 185.0 lb

## 2013-08-02 DIAGNOSIS — R1310 Dysphagia, unspecified: Secondary | ICD-10-CM

## 2013-08-02 DIAGNOSIS — Z86010 Personal history of colonic polyps: Secondary | ICD-10-CM

## 2013-08-02 DIAGNOSIS — K219 Gastro-esophageal reflux disease without esophagitis: Secondary | ICD-10-CM | POA: Insufficient documentation

## 2013-08-02 MED ORDER — OMEPRAZOLE 20 MG PO CPDR: 20.0000 mg | DELAYED_RELEASE_CAPSULE | Freq: Every day | ORAL | Status: DC

## 2013-08-02 NOTE — Assessment & Plan Note (Signed)
I will ask pathology to review the slides to determine whether the  polyp is a sessile serrated

## 2013-08-02 NOTE — Progress Notes (Signed)
_                                                                                                                History of Present Illness:  79 year old Afro-American male referred for evaluation of dysphagia.  He has intermittent dysphagia to solids.  With swallowing he often will cough.  He also has spontaneous coughing and he feels that he may get choked on his saliva.  When he bends over he has regurgitation of gastric contents and he complains of pyrosis.    Past Medical History  Diagnosis Date  . Hyperlipidemia   . Hypertension   . Gout   . Prediabetes    Past Surgical History  Procedure Laterality Date  . Carpal tunnel release Left 2007  . Rotator cuff repair Left 2000  . Other surgical history  2007    Negative biopsy tonsil mass   family history includes Hypertension in his mother; Seizures in his sister; Stroke in his father. Current Outpatient Prescriptions  Medication Sig Dispense Refill  . allopurinol (ZYLOPRIM) 300 MG tablet Take 300 mg by mouth daily. 1/2 tablet      . atenolol (TENORMIN) 100 MG tablet Take 1 tablet (100 mg total) by mouth daily.  90 tablet  1  . Cholecalciferol (VITAMIN D PO) Take 5,000 Int'l Units by mouth daily.      . clonazePAM (KLONOPIN) 2 MG tablet Take 2 mg by mouth daily. 1/2 to 1  tablet at bedtime      . Flaxseed, Linseed, (FLAX SEED OIL PO) Take by mouth daily.      Marland Kitchen gemfibrozil (LOPID) 600 MG tablet       . hydrochlorothiazide (HYDRODIURIL) 25 MG tablet Take 1 tablet (25 mg total) by mouth daily.  90 tablet  1  . Omega-3 Fatty Acids (FISH OIL PO) Take by mouth daily.      . Sildenafil Citrate (VIAGRA PO) Take by mouth as needed.       No current facility-administered medications for this visit.   Allergies as of 08/02/2013 - Review Complete 08/02/2013  Allergen Reaction Noted  . Ace inhibitors Cough 03/06/2013  . Lipitor [atorvastatin]  03/06/2013    reports that he has never smoked. He has never used smokeless  tobacco. He reports that he drinks alcohol. He reports that he does not use illicit drugs.     Review of Systems: Pertinent positive and negative review of systems were noted in the above HPI section. All other review of systems were otherwise negative.  Vital signs were reviewed in today's medical record Physical Exam: General: Well developed , well nourished, no acute distress Skin: anicteric Head: Normocephalic and atraumatic Eyes:  sclerae anicteric, EOMI Ears: Normal auditory acuity Mouth: No deformity or lesions Neck: Supple, no masses or thyromegaly Lungs: Clear throughout to auscultation Heart: Regular rate and rhythm; no murmurs, rubs or bruits Abdomen: Soft, non tender and non distended. No masses, hepatosplenomegaly or hernias noted. Normal Bowel sounds Rectal:deferred Musculoskeletal: Symmetrical with no gross  deformities  Skin: No lesions on visible extremities Pulses:  Normal pulses noted Extremities: No clubbing, cyanosis, edema or deformities noted Neurological: Alert oriented x 4, grossly nonfocal Cervical Nodes:  No significant cervical adenopathy Inguinal Nodes: No significant inguinal adenopathy Psychological:  Alert and cooperative. Normal mood and affect  See Assessment and Plan under Problem List

## 2013-08-02 NOTE — Patient Instructions (Signed)

## 2013-08-02 NOTE — Assessment & Plan Note (Signed)
Dysphagia may be do to an esophageal stricture.  A motility disorder must also be considered.  Recommendations #1 upper endoscopy with dilation as indicated #2 to consider swallowing study pending results of above

## 2013-08-02 NOTE — Assessment & Plan Note (Signed)
Plan to begin omeprazole 20 mg daily

## 2013-08-03 ENCOUNTER — Encounter: Payer: Self-pay | Admitting: Gastroenterology

## 2013-08-03 ENCOUNTER — Ambulatory Visit (AMBULATORY_SURGERY_CENTER): Payer: Medicare Other | Admitting: Gastroenterology

## 2013-08-03 VITALS — BP 123/64 | HR 46 | Temp 96.4°F | Resp 16 | Ht 71.5 in | Wt 185.0 lb

## 2013-08-03 DIAGNOSIS — K209 Esophagitis, unspecified without bleeding: Secondary | ICD-10-CM

## 2013-08-03 DIAGNOSIS — R1310 Dysphagia, unspecified: Secondary | ICD-10-CM

## 2013-08-03 MED ORDER — SODIUM CHLORIDE 0.9 % IV SOLN: 500.0000 mL | INTRAVENOUS | Status: DC

## 2013-08-03 NOTE — Patient Instructions (Signed)
Dr. Marzetta Board office will arrange a Barium Esophagram.  Continue your Omeprazole.   YOU HAD AN ENDOSCOPIC PROCEDURE TODAY AT THE [facility]: Refer to the procedure report that was given to you for any specific questions about what was found during the examination.  If the procedure report does not answer your questions, please call your gastroenterologist to clarify.  If you requested that your care partner not be given the details of your procedure findings, then the procedure report has been included in a sealed envelope for you to review at your convenience later.  YOU SHOULD EXPECT: Some feelings of bloating in the abdomen. Passage of more gas than usual.  Walking can help get rid of the air that was put into your GI tract during the procedure and reduce the bloating. If you had a lower endoscopy (such as a colonoscopy or flexible sigmoidoscopy) you may notice spotting of blood in your stool or on the toilet paper. If you underwent a bowel prep for your procedure, then you may not have a normal bowel movement for a few days.  DIET: Your first meal following the procedure should be a light meal and then it is ok to progress to your normal diet.  A half-sandwich or bowl of soup is an example of a good first meal.  Heavy or fried foods are harder to digest and may make you feel nauseous or bloated.  Likewise meals heavy in dairy and vegetables can cause extra gas to form and this can also increase the bloating.  Drink plenty of fluids but you should avoid alcoholic beverages for 24 hours.  ACTIVITY: Your care partner should take you home directly after the procedure.  You should plan to take it easy, moving slowly for the rest of the day.  You can resume normal activity the day after the procedure however you should NOT DRIVE or use heavy machinery for 24 hours (because of the sedation medicines used during the test).    SYMPTOMS TO REPORT IMMEDIATELY: A gastroenterologist can be reached  at any hour.  During normal business hours, 8:30 AM to 5:00 PM Monday through Friday, call [phone].  After hours and on weekends, please call the GI answering service at [phone] who will take a message and have the physician on call contact you.  Following upper endoscopy (EGD)  Vomiting of blood or coffee ground material  New chest pain or pain under the shoulder blades  Painful or persistently difficult swallowing  New shortness of breath  Fever of 100F or higher  Black, tarry-looking stools  FOLLOW UP: If any biopsies were taken you will be contacted by phone or by letter within the next 1-3 weeks.  Call your gastroenterologist if you have not heard about the biopsies in 3 weeks.  Our staff will call the home number listed on your records the next business day following your procedure to check on you and address any questions or concerns that you may have at that time regarding the information given to you following your procedure. This is a courtesy call and so if there is no answer at the home number and we have not heard from you through the emergency physician on call, we will assume that you have returned to your regular daily activities without incident.  SIGNATURES/CONFIDENTIALITY: You and/or your care partner have signed paperwork which will be entered into your electronic medical record.  These signatures attest to the fact that that the information above on  your After Visit Summary has been reviewed and is understood.  Full responsibility of the confidentiality of this discharge information lies with you and/or your care-partner. 

## 2013-08-03 NOTE — Progress Notes (Signed)
A/ox3, pleased with MAC, report to RN 

## 2013-08-03 NOTE — Op Note (Addendum)
[facility] [address] [place], [address]   ENDOSCOPY PROCEDURE REPORT  PATIENT: Jeremy Boone, Jeremy Boone  MR#: [id number] BIRTHDATE: 1934/06/16 , 79  yrs. old GENDER: Male ENDOSCOPIST: Louis Meckel, MD REFERRED BY:  [name], M.D. PROCEDURE DATE:  08/03/2013 PROCEDURE:  EGD, diagnostic ASA CLASS:     Class II INDICATIONS:  Dysphagia. MEDICATIONS: MAC sedation, administered by CRNA and propofol (Diprivan) 200mg  IV TOPICAL ANESTHETIC:  DESCRIPTION OF PROCEDURE: After the risks benefits and alternatives of the procedure were thoroughly explained, informed consent was obtained.  The [id number] endoscope was introduced through the mouth and advanced to the third portion of the duodenum. Without limitations.  The instrument was slowly withdrawn as the mucosa was fully examined.      The upper, middle and distal third of the esophagus were carefully inspected.  There was at least one erosion extending 2 cm proximally from the GE junction.. The esophagus may have been somewhat patulous. The z-line was well seen at the GEJ.  The endoscope was pushed into the fundus which was normal including a retroflexed view.  The antrum, gastric body, first and second part of the duodenum were unremarkable.  Retroflexed views revealed no abnormalities.     The scope was then withdrawn from the patient and the procedure completed.  COMPLICATIONS: There were no complications.  ENDOSCOPIC IMPRESSION: 1.  erosive esophagitis 2.  ? patulous esophagus)  RECOMMENDATIONS: My office will arrange for you to have a Barium Esophagram performed.  This is a radiology test to examine your esophagus. continue omeprazole  REPEAT EXAM:  eSigned:  Louis Meckel, MD 08/03/2013 4:33 PM Revised: 08/03/2013 4:33 PM  CC:  PATIENT NAME:  [name], [name] MR#: [id number]

## 2013-08-04 ENCOUNTER — Telehealth: Payer: Self-pay

## 2013-08-04 ENCOUNTER — Other Ambulatory Visit: Payer: Self-pay

## 2013-08-04 DIAGNOSIS — R1319 Other dysphagia: Secondary | ICD-10-CM

## 2013-08-04 NOTE — Telephone Encounter (Signed)
Invalid phone number.

## 2013-08-04 NOTE — Telephone Encounter (Signed)
Pt scheduled for Barium esophagram at [facility] 08/09/13@11 :30am. Pt to arrive there at 11:15am and be NPO after 8:30am. Pt aware of appt.

## 2013-08-08 NOTE — Telephone Encounter (Signed)
Attempt follow up call 08-04-2013. Contact number invalid.

## 2013-08-09 ENCOUNTER — Ambulatory Visit (HOSPITAL_COMMUNITY)
Admission: RE | Admit: 2013-08-09 | Discharge: 2013-08-09 | Disposition: A | Discharge status: Home / Self Care | Payer: Medicare Other | Source: Ambulatory Visit | Attending: Gastroenterology | Admitting: Gastroenterology

## 2013-08-09 DIAGNOSIS — K224 Dyskinesia of esophagus: Secondary | ICD-10-CM | POA: Insufficient documentation

## 2013-08-09 DIAGNOSIS — K449 Diaphragmatic hernia without obstruction or gangrene: Secondary | ICD-10-CM | POA: Insufficient documentation

## 2013-08-09 DIAGNOSIS — R1319 Other dysphagia: Secondary | ICD-10-CM

## 2013-08-09 DIAGNOSIS — K219 Gastro-esophageal reflux disease without esophagitis: Secondary | ICD-10-CM | POA: Insufficient documentation

## 2013-08-09 NOTE — Progress Notes (Signed)
Quick Note:  Please inform the patient that xray did not show significant abnormalities and to continue current plan of action ______

## 2013-08-27 ENCOUNTER — Other Ambulatory Visit: Payer: Self-pay | Admitting: Internal Medicine

## 2013-08-31 ENCOUNTER — Telehealth: Payer: Self-pay | Admitting: Gastroenterology

## 2013-08-31 NOTE — Telephone Encounter (Signed)
Pt states he thought he was told he had an infection in his esophagus. Pt questioning if he needs to be on an antibiotic. Dr. Arlyce DiceKaplan please advise.

## 2013-09-02 NOTE — Telephone Encounter (Signed)
He is mistaken.  No infection.  Needs f/u OV

## 2013-09-04 NOTE — Telephone Encounter (Signed)
Pt scheduled to see Dr. Arlyce DiceKaplan 11/07/13@2 :30pm. Pt aware of appt.

## 2013-09-14 ENCOUNTER — Ambulatory Visit (INDEPENDENT_AMBULATORY_CARE_PROVIDER_SITE_OTHER): Payer: Medicare Other | Admitting: Internal Medicine

## 2013-09-14 ENCOUNTER — Encounter: Payer: Self-pay | Admitting: Internal Medicine

## 2013-09-14 VITALS — BP 108/70 | HR 56 | Temp 97.7°F | Resp 16 | Ht 71.0 in | Wt 184.4 lb

## 2013-09-14 DIAGNOSIS — E559 Vitamin D deficiency, unspecified: Secondary | ICD-10-CM

## 2013-09-14 DIAGNOSIS — I10 Essential (primary) hypertension: Secondary | ICD-10-CM

## 2013-09-14 DIAGNOSIS — Z789 Other specified health status: Secondary | ICD-10-CM

## 2013-09-14 DIAGNOSIS — M109 Gout, unspecified: Secondary | ICD-10-CM

## 2013-09-14 DIAGNOSIS — R7303 Prediabetes: Secondary | ICD-10-CM

## 2013-09-14 DIAGNOSIS — Z1331 Encounter for screening for depression: Secondary | ICD-10-CM

## 2013-09-14 DIAGNOSIS — Z0000 Encounter for general adult medical examination without abnormal findings: Secondary | ICD-10-CM

## 2013-09-14 DIAGNOSIS — Z125 Encounter for screening for malignant neoplasm of prostate: Secondary | ICD-10-CM

## 2013-09-14 DIAGNOSIS — Z1212 Encounter for screening for malignant neoplasm of rectum: Secondary | ICD-10-CM

## 2013-09-14 DIAGNOSIS — E785 Hyperlipidemia, unspecified: Secondary | ICD-10-CM

## 2013-09-14 DIAGNOSIS — Z79899 Other long term (current) drug therapy: Secondary | ICD-10-CM | POA: Insufficient documentation

## 2013-09-14 LAB — CBC WITH DIFFERENTIAL/PLATELET
Basophils Absolute: 0.1 10*3/uL (ref 0.0–0.1)
Basophils Relative: 1 % (ref 0–1)
EOS PCT: 10 % — AB (ref 0–5)
Eosinophils Absolute: 0.6 10*3/uL (ref 0.0–0.7)
HEMATOCRIT: 42.6 % (ref 39.0–52.0)
HEMOGLOBIN: 14.5 g/dL (ref 13.0–17.0)
LYMPHS ABS: 2.8 10*3/uL (ref 0.7–4.0)
Lymphocytes Relative: 50 % — ABNORMAL HIGH (ref 12–46)
MCH: 30.6 pg (ref 26.0–34.0)
MCHC: 34 g/dL (ref 30.0–36.0)
MCV: 89.9 fL (ref 78.0–100.0)
MONO ABS: 0.5 10*3/uL (ref 0.1–1.0)
Monocytes Relative: 9 % (ref 3–12)
NEUTROS ABS: 1.7 10*3/uL (ref 1.7–7.7)
Neutrophils Relative %: 30 % — ABNORMAL LOW (ref 43–77)
Platelets: 233 10*3/uL (ref 150–400)
RBC: 4.74 MIL/uL (ref 4.22–5.81)
RDW: 14.1 % (ref 11.5–15.5)
WBC: 5.6 10*3/uL (ref 4.0–10.5)

## 2013-09-14 NOTE — Patient Instructions (Addendum)
Recommend the book "The END of DIETING" by Dr Nyra Market   and the book "The END of DIABETES " by Dr Monico Hoar  At [email or web address] - get book & Audio CD's      Being diabetic has a  300% increased risk for heart attack, stroke, cancer, and alzheimer- type vascular dementia. It is very important that you work harder with diet by avoiding all foods that are white except chicken & fish. Avoid white rice (brown & wild rice is OK), white potatoes (sweetpotatoes in moderation is OK), White bread or wheat bread or anything made out of white flour like bagels, donuts, rolls, buns, biscuits, cakes, pastries, cookies, pizza crust, and pasta (made from white flour & egg whites) - vegetarian pasta or spinach or wheat pasta is OK. Multigrain breads like Arnold's or Pepper[employer], or multigrain sandwich thins or flatbreads.  Diet, exercise and weight loss can reverse and cure diabetes in the early stages.  Diet, exercise and weight loss is very important in the control and prevention of complications of diabetes which affects every system in your body, ie. Brain - dementia/stroke, eyes - glaucoma/blindness, heart - heart attack/heart failure, kidneys - dialysis, stomach - gastric paralysis, intestines - malabsorption, nerves - severe painful neuritis, circulation - gangrene & loss of a leg(s), and finally cancer and Alzheimers.    I recommend avoid fried & greasy foods,  sweets/candy, white rice (brown or wild rice or Quinoa is OK), white potatoes (sweet potatoes are OK) - anything made from white flour - bagels, doughnuts, rolls, buns, biscuits,white and wheat breads, pizza crust and traditional pasta made of white flour & egg white(vegetarian pasta or spinach or wheat pasta is OK).  Multi-grain bread is OK - like multi-grain flat bread or sandwich thins. Avoid alcohol in excess. Exercise is also important.    Eat all the vegetables you want - avoid meat, especially red meat and dairy - especially cheese.   Cheese is the most concentrated form of trans-fats which is the worst thing to clog up our arteries. Veggie cheese is OK which can be found in the fresh produce section at [facility] or [employer] or [employer]   Preventative Care for Adults, Male       REGULAR HEALTH EXAMS:  A routine yearly physical is a good way to check in with your primary care provider about your health and preventive screening. It is also an opportunity to share updates about your health and any concerns you have, and receive a thorough all-over exam.   Most health insurance companies pay for at least some preventative services.  Check with your health plan for specific coverages.  WHAT PREVENTATIVE SERVICES DO MEN NEED?  Adult men should have their weight and blood pressure checked regularly.   Men age 70 and older should have their cholesterol levels checked regularly.  Beginning at age 10 and continuing to age 31, men should be screened for colorectal cancer.  Certain people should may need continued testing until age 49.  Other cancer screening may include exams for testicular and prostate cancer.  Updating vaccinations is part of preventative care.  Vaccinations help protect against diseases such as the flu.  Lab tests are generally done as part of preventative care to screen for anemia and blood disorders, to screen for problems with the kidneys and liver, to screen for bladder problems, to check blood sugar, and to check your cholesterol level.  Preventative services generally include counseling about diet, exercise,  avoiding tobacco, drugs, excessive alcohol consumption, and sexually transmitted infections.    GENERAL RECOMMENDATIONS FOR GOOD HEALTH:  Healthy diet:  Eat a variety of foods, including fruit, vegetables, animal or vegetable protein, such as meat, fish, chicken, and eggs, or beans, lentils, tofu, and grains, such as rice.  Drink plenty of water daily.  Decrease saturated fat in the  diet, avoid lots of red meat, processed foods, sweets, fast foods, and fried foods.  Exercise:  Aerobic exercise helps maintain good heart health. At least 30-40 minutes of moderate-intensity exercise is recommended. For example, a brisk walk that increases your heart rate and breathing. This should be done on most days of the week.   Find a type of exercise or a variety of exercises that you enjoy so that it becomes a part of your daily life.  Examples are running, walking, swimming, water aerobics, and biking.  For motivation and support, explore group exercise such as aerobic class, spin class, Zumba, Yoga,or  martial arts, etc.    Set exercise goals for yourself, such as a certain weight goal, walk or run in a race such as a 5k walk/run.  Speak to your primary care provider about exercise goals.  Disease prevention:  If you smoke or chew tobacco, find out from your caregiver how to quit. It can literally save your life, no matter how long you have been a tobacco user. If you do not use tobacco, never begin.   Maintain a healthy diet and normal weight. Increased weight leads to problems with blood pressure and diabetes.   The Body Mass Index or BMI is a way of measuring how much of your body is fat. Having a BMI above 27 increases the risk of heart disease, diabetes, hypertension, stroke and other problems related to obesity. Your caregiver can help determine your BMI and based on it develop an exercise and dietary program to help you achieve or maintain this important measurement at a healthful level.  High blood pressure causes heart and blood vessel problems.  Persistent high blood pressure should be treated with medicine if weight loss and exercise do not work.   Fat and cholesterol leaves deposits in your arteries that can block them. This causes heart disease and vessel disease elsewhere in your body.  If your cholesterol is found to be high, or if you have heart disease or certain other  medical conditions, then you may need to have your cholesterol monitored frequently and be treated with medication.   Ask if you should have a stress test if your history suggests this. A stress test is a test done on a treadmill that looks for heart disease. This test can find disease prior to there being a problem.  Avoid drinking alcohol in excess (more than two drinks per day).  Avoid use of street drugs. Do not share needles with anyone. Ask for professional help if you need assistance or instructions on stopping the use of alcohol, cigarettes, and/or drugs.  Brush your teeth twice a day with fluoride toothpaste, and floss once a day. Good oral hygiene prevents tooth decay and gum disease. The problems can be painful, unattractive, and can cause other health problems. Visit your dentist for a routine oral and dental check up and preventive care every 6-12 months.   Look at your skin regularly.  Use a mirror to look at your back. Notify your caregivers of changes in moles, especially if there are changes in shapes, colors, a size larger than  a pencil eraser, an irregular border, or development of new moles.  Safety:  Use seatbelts 100% of the time, whether driving or as a passenger.  Use safety devices such as hearing protection if you work in environments with loud noise or significant background noise.  Use safety glasses when doing any work that could send debris in to the eyes.  Use a helmet if you ride a bike or motorcycle.  Use appropriate safety gear for contact sports.  Talk to your caregiver about gun safety.  Use sunscreen with a SPF (or skin protection factor) of 15 or greater.  Lighter skinned people are at a greater risk of skin cancer. Don't forget to also wear sunglasses in order to protect your eyes from too much damaging sunlight. Damaging sunlight can accelerate cataract formation.   Practice safe sex. Use condoms. Condoms are used for birth control and to help reduce the spread  of sexually transmitted infections (or STIs).  Some of the STIs are gonorrhea (the clap), chlamydia, syphilis, trichomonas, herpes, HPV (human papilloma virus) and HIV (human immunodeficiency virus) which causes AIDS. The herpes, HIV and HPV are viral illnesses that have no cure. These can result in disability, cancer and death.   Keep carbon monoxide and smoke detectors in your home functioning at all times. Change the batteries every 6 months or use a model that plugs into the wall.   Vaccinations:  Stay up to date with your tetanus shots and other required immunizations. You should have a booster for tetanus every 10 years. Be sure to get your flu shot every year, since 5%-20% of the [place] population comes down with the flu. The flu vaccine changes each year, so being vaccinated once is not enough. Get your shot in the fall, before the flu season peaks.   Other vaccines to consider:  Pneumococcal vaccine to protect against certain types of pneumonia.  This is normally recommended for adults age 4 or older.  However, adults younger than 77 years old with certain underlying conditions such as diabetes, heart or lung disease should also receive the vaccine.  Shingles vaccine to protect against Varicella Zoster if you are older than age 36, or younger than 77 years old with certain underlying illness.  Hepatitis A vaccine to protect against a form of infection of the liver by a virus acquired from food.  Hepatitis B vaccine to protect against a form of infection of the liver by a virus acquired from blood or body fluids, particularly if you work in health care.  If you plan to travel internationally, check with your local health department for specific vaccination recommendations.  Cancer Screening:  Most routine colon cancer screening begins at the age of 39. On a yearly basis, doctors may provide special easy to use take-home tests to check for hidden blood in the stool. Sigmoidoscopy or  colonoscopy can detect the earliest forms of colon cancer and is life saving. These tests use a small camera at the end of a tube to directly examine the colon. Speak to your caregiver about this at age 28, when routine screening begins (and is repeated every 5 years unless early forms of pre-cancerous polyps or small growths are found).   At the age of 63 men usually start screening for prostate cancer every year. Screening may begin at a younger age for those with higher risk. Those at higher risk include African-Americans or having a family history of prostate cancer. There are two types of tests for  prostate cancer:   Prostate-specific antigen (PSA) testing. Recent studies raise questions about prostate cancer using PSA and you should discuss this with your caregiver.   Digital rectal exam (in which your doctor's lubricated and gloved finger feels for enlargement of the prostate through the anus).   Screening for testicular cancer.  Do a monthly exam of your testicles. Gently roll each testicle between your thumb and fingers, feeling for any abnormal lumps. The best time to do this is after a hot shower or bath when the tissues are looser. Notify your caregivers of any lumps, tenderness or changes in size or shape immediately.

## 2013-09-14 NOTE — Progress Notes (Signed)
Patient ID: Jeremy Boone, male   DOB: November 27, 1934, 79 y.o.   MRN: [id number]   Annual Screening Comprehensive Examination  This very nice 79 y.o.male presents for complete physical.  Patient has been followed for HTN,  Prediabetes, Hyperlipidemia, and Vitamin D Deficiency.   HTN predates since 1998. Patient's BP has been controlled at home.Today's BP: 108/70 mmHg. Patient denies any cardiac symptoms as chest pain, palpitations, shortness of breath, dizziness or ankle swelling.   Patient is allergic to statins and his  hyperlipidemia is not controlled with diet and medications. Patient denies myalgias or other medication SE's. Last Lipids in April were not at goal.  Lab Results  Component Value Date   CHOL 219* 06/08/2013   HDL 68 06/08/2013   LDLCALC 409* 06/08/2013   TRIG 104 06/08/2013   CHOLHDL 3.2 06/08/2013    Patient has prediabetes since 07/2009 with A1c 6.3% and last A1c was 6.1% in Apr 2015. Patient denies reactive hypoglycemic symptoms, visual blurring, diabetic polys or paresthesias.    Finally, patient has history of Vitamin D Deficiency of 25 in 2008 and last vitamin D was 100 in Apr 2015.   Medication Sig  . allopurinol (ZYLOPRIM) 300 MG tablet Take 300 mg by mouth daily. 1/2 tablet  . atenolol (TENORMIN) 100 MG tablet Take 1 tablet (100 mg total) by mouth daily.  . Cholecalciferol (VITAMIN D PO) Take 5,000 Int'l Units by mouth. Takes every other day  . clonazePAM (KLONOPIN) 2 MG tablet TAKE 1 TABLET BY MOUTH AT BEDTIME AS NEEDED FOR SLEEP  . Flaxseed, Linseed, (FLAX SEED OIL PO) Take by mouth 2 (two) times daily.   Marland Kitchen gemfibrozil (LOPID) 600 MG tablet Takes 1 daily  . hydrochlorothiazide (HYDRODIURIL) 25 MG tablet Take 1 tablet (25 mg total) by mouth daily.  . Omega-3 Fatty Acids (FISH OIL PO) Take by mouth 2 (two) times daily.   Marland Kitchen omeprazole (PRILOSEC) 20 MG capsule Take 1 capsule (20 mg total) by mouth daily.  . Sildenafil Citrate (VIAGRA PO) Take by mouth as needed.    Allergies  Allergen Reactions  . Ace Inhibitors Cough  . Lipitor [Atorvastatin]     Elevates CPK and Aldolase   Past Medical History  Diagnosis Date  . Hyperlipidemia   . Hypertension   . Gout   . Prediabetes    Past Surgical History  Procedure Laterality Date  . Carpal tunnel release Left 2007  . Rotator cuff repair Left 2000  . Other surgical history  2007    Negative biopsy tonsil mass   Family History  Problem Relation Age of Onset  . Hypertension Mother   . Stroke Father   . Seizures Sister    History   Social History  . Marital Status: Married    Spouse Name: N/A    Number of Children: N/A  . Years of Education: N/A   Occupational History  . Retired Corporate investment banker for [employer].   Social History Main Topics  . Smoking status: Never Smoker   . Smokeless tobacco: Never Used  . Alcohol Use: 7.5 oz/week    15 drink(s) per week  . Drug Use: No  . Sexual Activity: Not on file    ROS Constitutional: Denies fever, chills, weight loss/gain, headaches, insomnia, fatigue, night sweats or change in appetite. Eyes: Denies redness, blurred vision, diplopia, discharge, itchy or watery eyes.  ENT: Denies discharge, congestion, post nasal drip, epistaxis, sore throat, earache, hearing loss, dental pain, Tinnitus, Vertigo, Sinus pain or snoring.  Cardio: Denies chest pain, palpitations, irregular heartbeat, syncope, dyspnea, diaphoresis, orthopnea, PND, claudication or edema Respiratory: denies cough, dyspnea, DOE, pleurisy, hoarseness, laryngitis or wheezing.  Gastrointestinal: Denies dysphagia, heartburn, reflux, water brash, pain, cramps, nausea, vomiting, bloating, diarrhea, constipation, hematemesis, melena, hematochezia, jaundice or hemorrhoids Genitourinary: Denies dysuria, frequency, urgency, nocturia, hesitancy, discharge, hematuria or flank pain Musculoskeletal: Denies arthralgia, myalgia, stiffness, Jt. Swelling, pain, limp or strain/sprain.  Denies falls. Skin: Denies puritis, rash, hives, warts, acne, eczema or change in skin lesion Neuro: No weakness, tremor, incoordination, spasms, paresthesia or pain Psychiatric: Denies confusion, memory loss or sensory loss. Denies depression. Endocrine: Denies change in weight, skin, hair change, nocturia, and paresthesia, diabetic polys, visual blurring or hyper / hypo glycemic episodes.  Heme/Lymph: No excessive bleeding, bruising or enlarged lymph nodes.  Physical Exam  BP 108/70  Pulse 56  Temp(Src) 97.7 F (36.5 C) (Temporal)  Resp 16  Ht 5\' 11"  (1.803 m)  Wt 184 lb 6.4 oz (83.643 kg)  BMI 25.73 kg/m2  General Appearance: Well nourished, in no apparent distress. Eyes: PERRLA, EOMs, conjunctiva no swelling or erythema, normal fundi and vessels. Sinuses: No frontal/maxillary tenderness ENT/Mouth: EACs patent / TMs  nl. Nares clear without erythema, swelling, mucoid exudates. Oral hygiene is good. No erythema, swelling, or exudate. Tongue normal, non-obstructing. Tonsils not swollen or erythematous. Hearing normal.  Neck: Supple, thyroid normal. No bruits, nodes or JVD. Respiratory: Respiratory effort normal.  BS equal and clear bilateral without rales, rhonci, wheezing or stridor. Cardio: Heart sounds are normal with regular rate and rhythm and no murmurs, rubs or gallops. Peripheral pulses are normal and equal bilaterally without edema. No aortic or femoral bruits. Chest: symmetric with normal excursions and percussion.  Abdomen: Flat, soft, with bowl sounds. Nontender, no guarding, rebound, hernias, masses, or organomegaly.  Lymphatics: Non tender without lymphadenopathy.  Genitourinary: No hernias.Testes nl. DRE - prostate nl for age - smooth & firm w/o nodules. Musculoskeletal: Full ROM all peripheral extremities, joint stability, 5/5 strength, and normal gait. Skin: Warm and dry without rashes, lesions, cyanosis, clubbing or  ecchymosis.  Neuro: Cranial nerves intact,  reflexes equal bilaterally. Normal muscle tone, no cerebellar symptoms. Sensation intact.  Pysch: Awake and oriented X 3with normal affect, insight and judgment appropriate.   Assessment and Plan  1. Annual Screening Examination 2. Hypertension  3. Hyperlipidemia 4. Pre Diabetes 5. Vitamin D Deficiency 6. GERD  Continue prudent diet as discussed, weight control, BP monitoring, regular exercise, and medications as discussed.  Discussed med effects and SE's. Routine screening labs and tests as requested with regular follow-up as recommended.

## 2013-09-15 LAB — MICROALBUMIN / CREATININE URINE RATIO
Creatinine, Urine: 115.9 mg/dL
MICROALB/CREAT RATIO: 4.5 mg/g (ref 0.0–30.0)
Microalb, Ur: 0.52 mg/dL (ref 0.00–1.89)

## 2013-09-15 LAB — HEPATIC FUNCTION PANEL
ALK PHOS: 73 U/L (ref 39–117)
ALT: 14 U/L (ref 0–53)
AST: 28 U/L (ref 0–37)
Albumin: 4.2 g/dL (ref 3.5–5.2)
BILIRUBIN TOTAL: 0.3 mg/dL (ref 0.2–1.2)
Bilirubin, Direct: 0.1 mg/dL (ref 0.0–0.3)
Indirect Bilirubin: 0.2 mg/dL (ref 0.2–1.2)
TOTAL PROTEIN: 7.3 g/dL (ref 6.0–8.3)

## 2013-09-15 LAB — MAGNESIUM: Magnesium: 2.1 mg/dL (ref 1.5–2.5)

## 2013-09-15 LAB — HEMOGLOBIN A1C
Hgb A1c MFr Bld: 6.1 % — ABNORMAL HIGH (ref <5.7)
Mean Plasma Glucose: 128 mg/dL — ABNORMAL HIGH (ref <117)

## 2013-09-15 LAB — BASIC METABOLIC PANEL WITH GFR
BUN: 14 mg/dL (ref 6–23)
CHLORIDE: 97 meq/L (ref 96–112)
CO2: 27 meq/L (ref 19–32)
CREATININE: 0.96 mg/dL (ref 0.50–1.35)
Calcium: 9.2 mg/dL (ref 8.4–10.5)
GFR, Est African American: 88 mL/min
GFR, Est Non African American: 76 mL/min
Glucose, Bld: 74 mg/dL (ref 70–99)
POTASSIUM: 3.7 meq/L (ref 3.5–5.3)
Sodium: 137 mEq/L (ref 135–145)

## 2013-09-15 LAB — URINALYSIS, MICROSCOPIC ONLY
Bacteria, UA: NONE SEEN
Casts: NONE SEEN
Crystals: NONE SEEN
SQUAMOUS EPITHELIAL / LPF: NONE SEEN

## 2013-09-15 LAB — PSA: PSA: 2.23 ng/mL (ref <=4.00)

## 2013-09-15 LAB — VITAMIN D 25 HYDROXY (VIT D DEFICIENCY, FRACTURES): Vit D, 25-Hydroxy: 74 ng/mL (ref 30–89)

## 2013-09-15 LAB — LIPID PANEL
Cholesterol: 236 mg/dL — ABNORMAL HIGH (ref 0–200)
HDL: 65 mg/dL (ref >39)
LDL CALC: 146 mg/dL — AB (ref 0–99)
TRIGLYCERIDES: 123 mg/dL (ref <150)
Total CHOL/HDL Ratio: 3.6 Ratio
VLDL: 25 mg/dL (ref 0–40)

## 2013-09-15 LAB — INSULIN, FASTING: Insulin fasting, serum: 8 u[IU]/mL (ref 3–28)

## 2013-09-15 LAB — TSH: TSH: 2.186 u[IU]/mL (ref 0.350–4.500)

## 2013-09-15 LAB — URIC ACID: Uric Acid, Serum: 6.3 mg/dL (ref 4.0–7.8)

## 2013-09-18 ENCOUNTER — Other Ambulatory Visit: Payer: Self-pay | Admitting: Physician Assistant

## 2013-11-07 ENCOUNTER — Ambulatory Visit: Payer: Medicare Other | Admitting: Gastroenterology

## 2013-11-15 ENCOUNTER — Other Ambulatory Visit: Payer: Self-pay | Admitting: Internal Medicine

## 2013-12-15 ENCOUNTER — Other Ambulatory Visit: Payer: Self-pay

## 2013-12-18 ENCOUNTER — Ambulatory Visit (INDEPENDENT_AMBULATORY_CARE_PROVIDER_SITE_OTHER): Payer: Medicare Other | Admitting: Physician Assistant

## 2013-12-18 VITALS — BP 122/70 | HR 56 | Temp 97.7°F | Resp 16 | Ht 71.0 in | Wt 182.0 lb

## 2013-12-18 DIAGNOSIS — E785 Hyperlipidemia, unspecified: Secondary | ICD-10-CM

## 2013-12-18 DIAGNOSIS — Z79899 Other long term (current) drug therapy: Secondary | ICD-10-CM

## 2013-12-18 DIAGNOSIS — R7309 Other abnormal glucose: Secondary | ICD-10-CM

## 2013-12-18 DIAGNOSIS — I10 Essential (primary) hypertension: Secondary | ICD-10-CM

## 2013-12-18 DIAGNOSIS — Z23 Encounter for immunization: Secondary | ICD-10-CM

## 2013-12-18 DIAGNOSIS — E559 Vitamin D deficiency, unspecified: Secondary | ICD-10-CM

## 2013-12-18 DIAGNOSIS — R7303 Prediabetes: Secondary | ICD-10-CM

## 2013-12-18 LAB — CBC WITH DIFFERENTIAL/PLATELET
BASOS ABS: 0.1 10*3/uL (ref 0.0–0.1)
BASOS PCT: 1 % (ref 0–1)
Eosinophils Absolute: 0.8 10*3/uL — ABNORMAL HIGH (ref 0.0–0.7)
Eosinophils Relative: 14 % — ABNORMAL HIGH (ref 0–5)
HCT: 44.3 % (ref 39.0–52.0)
Hemoglobin: 15.3 g/dL (ref 13.0–17.0)
Lymphocytes Relative: 46 % (ref 12–46)
Lymphs Abs: 2.5 10*3/uL (ref 0.7–4.0)
MCH: 31 pg (ref 26.0–34.0)
MCHC: 34.5 g/dL (ref 30.0–36.0)
MCV: 89.9 fL (ref 78.0–100.0)
MONO ABS: 0.5 10*3/uL (ref 0.1–1.0)
Monocytes Relative: 9 % (ref 3–12)
NEUTROS ABS: 1.6 10*3/uL — AB (ref 1.7–7.7)
Neutrophils Relative %: 30 % — ABNORMAL LOW (ref 43–77)
Platelets: 267 10*3/uL (ref 150–400)
RBC: 4.93 MIL/uL (ref 4.22–5.81)
RDW: 14.1 % (ref 11.5–15.5)
WBC: 5.4 10*3/uL (ref 4.0–10.5)

## 2013-12-18 NOTE — Progress Notes (Signed)
Assessment and Plan:  Hypertension: Continue medication, monitor blood pressure at home. Continue DASH diet.  Reminder to go to the ER if any CP, SOB, nausea, dizziness, severe HA, changes vision/speech, left arm numbness and tingling, and jaw pain. Cholesterol: Continue diet and exercise. Check cholesterol.  Pre-diabetes-Continue diet and exercise. Check A1C Vitamin D Def- check level and continue medications.  Nodule- abnormal- left elbow, needs removal- need prevnar then too  Continue diet and meds as discussed. Further disposition pending results of labs.  HPI 79 y.o. male  presents for 3 month follow up with hypertension, hyperlipidemia, prediabetes and vitamin D.  His blood pressure has been controlled at home, today their BP is BP: 122/70 mmHg He does workout. He denies chest pain, shortness of breath, dizziness.  He is on cholesterol medication and denies myalgias. His cholesterol is not at goal. The cholesterol last visit was:   Lab Results  Component Value Date   CHOL 236* 09/14/2013   HDL 65 09/14/2013   LDLCALC 146* 09/14/2013   TRIG 123 09/14/2013   CHOLHDL 3.6 09/14/2013   He has been working on diet and exercise for prediabetes, and denies nausea, paresthesia of the feet and polydipsia. Last A1C in the office was:  Lab Results  Component Value Date   HGBA1C 6.1* 09/14/2013  Patient is on Vitamin D supplement.   Lab Results  Component Value Date   VD25OH 74 09/14/2013     He has a nodule on left lateral elbow that is pruritic for 2-3 months.    Current Medications:  Current Outpatient Prescriptions on File Prior to Visit  Medication Sig Dispense Refill  . allopurinol (ZYLOPRIM) 300 MG tablet Take 300 mg by mouth daily. 1/2 tablet      . atenolol (TENORMIN) 100 MG tablet TAKE 1 TABLET (100 MG TOTAL) BY MOUTH DAILY.  90 tablet  1  . Cholecalciferol (VITAMIN D PO) Take 5,000 Int'l Units by mouth. Takes every other day      . clonazePAM (KLONOPIN) 2 MG tablet TAKE 1 TABLET  AT BEDTIME AS NEEDED FOR SLEEP  30 tablet  3  . Flaxseed, Linseed, (FLAX SEED OIL PO) Take by mouth 2 (two) times daily.       Marland Kitchen. gemfibrozil (LOPID) 600 MG tablet Takes 1 daily      . hydrochlorothiazide (HYDRODIURIL) 25 MG tablet Take 1 tablet (25 mg total) by mouth daily.  90 tablet  1  . Omega-3 Fatty Acids (FISH OIL PO) Take by mouth 2 (two) times daily.       Marland Kitchen. omeprazole (PRILOSEC) 20 MG capsule Take 1 capsule (20 mg total) by mouth daily.  30 capsule  3  . Sildenafil Citrate (VIAGRA PO) Take by mouth as needed.       No current facility-administered medications on file prior to visit.   Medical History:  Past Medical History  Diagnosis Date  . Hyperlipidemia   . Hypertension   . Gout   . Prediabetes    Allergies:  Allergies  Allergen Reactions  . Ace Inhibitors Cough  . Lipitor [Atorvastatin]     Elevates CPK and Aldolase     Review of Systems: [X]  = complains of  [ ]  = denies  General: Fatigue [ ]  Fever [ ]  Chills [ ]  Weakness [ ]   Insomnia [ ]  Eyes: Redness [ ]  Blurred vision [ ]  Diplopia [ ]   ENT: Congestion [ ]  Sinus Pain [ ]  Post Nasal Drip [ ]  Sore Throat [ ]   Earache [ ]   Cardiac: Chest pain/pressure [ ]  SOB [ ]  Orthopnea [ ]   Palpitations [ ]   Paroxysmal nocturnal dyspnea[ ]  Claudication [ ]  Edema [ ]   Pulmonary: Cough [ ]  Wheezing[ ]   SOB [ ]   Snoring [ ]   GI: Nausea [ ]  Vomiting[ ]  Dysphagia[ ]  Heartburn[ ]  Abdominal pain [ ]  Constipation [ ] ; Diarrhea [ ] ; BRBPR [ ]  Melena[ ]  GU: Hematuria[ ]  Dysuria [ ]  Nocturia[ ]  Urgency [ ]   Hesitancy [ ]  Discharge [ ]  Neuro: Headaches[ ]  Vertigo[ ]  Paresthesias[ ]  Spasm [ ]  Speech changes [ ]  Incoordination [ ]   Ortho: Arthritis [ ]  Joint pain [ ]  Muscle pain [ ]  Joint swelling [ ]  Back Pain [ ]  Skin:  Rash [ ]   Pruritis [ ]  Change in skin lesion [x ]  Psych: Depression[ ]  Anxiety[ ]  Confusion [ ]  Memory loss [ ]   Heme/Lypmh: Bleeding [ ]  Bruising [ ]  Enlarged lymph nodes [ ]   Endocrine: Visual blurring [ ]  Paresthesia [  ] Polyuria [ ]  Polydypsea [ ]    Heat/cold intolerance [ ]  Hypoglycemia [ ]   Family history- Review and unchanged Social history- Review and unchanged Physical Exam: BP 122/70  Pulse 56  Temp(Src) 97.7 F (36.5 C)  Resp 16  Ht 5\' 11"  (1.803 m)  Wt 182 lb (82.555 kg)  BMI 25.40 kg/m2 Wt Readings from Last 3 Encounters:  12/18/13 182 lb (82.555 kg)  09/14/13 184 lb 6.4 oz (83.643 kg)  08/03/13 185 lb (83.915 kg)   General Appearance: Well nourished, in no apparent distress. Eyes: PERRLA, EOMs, conjunctiva no swelling or erythema Sinuses: No Frontal/maxillary tenderness ENT/Mouth: Ext aud canals clear, TMs without erythema, bulging. No erythema, swelling, or exudate on post pharynx.  Tonsils not swollen or erythematous. Hearing normal.  Neck: Supple, thyroid normal.  Respiratory: Respiratory effort normal, BS equal bilaterally without rales, rhonchi, wheezing or stridor.  Cardio: RRR with no MRGs. Brisk peripheral pulses without edema.  Abdomen: Soft, + BS.  Non tender, no guarding, rebound, hernias, masses. Lymphatics: Non tender without lymphadenopathy.  Musculoskeletal: Full ROM, 5/5 strength, normal gait.  Skin: Warm, dry without rashes, lesions, ecchymosis. Left lateral elbow with 3x4 mm scaly nodule Neuro: Cranial nerves intact. Normal muscle tone, no cerebellar symptoms. Sensation intact.  Psych: Awake and oriented X 3, normal affect, Insight and Judgment appropriate.    Quentin Mullingollier, Henli Hey, PA-C 3:23 PM [facility]

## 2013-12-18 NOTE — Patient Instructions (Signed)

## 2013-12-19 LAB — HEPATIC FUNCTION PANEL
ALK PHOS: 72 U/L (ref 39–117)
ALT: 12 U/L (ref 0–53)
AST: 25 U/L (ref 0–37)
Albumin: 4.3 g/dL (ref 3.5–5.2)
BILIRUBIN DIRECT: 0.1 mg/dL (ref 0.0–0.3)
BILIRUBIN INDIRECT: 0.2 mg/dL (ref 0.2–1.2)
BILIRUBIN TOTAL: 0.3 mg/dL (ref 0.2–1.2)
Total Protein: 7.7 g/dL (ref 6.0–8.3)

## 2013-12-19 LAB — LIPID PANEL
Cholesterol: 233 mg/dL — ABNORMAL HIGH (ref 0–200)
HDL: 73 mg/dL (ref >39)
LDL Cholesterol: 130 mg/dL — ABNORMAL HIGH (ref 0–99)
Total CHOL/HDL Ratio: 3.2 Ratio
Triglycerides: 151 mg/dL — ABNORMAL HIGH (ref <150)
VLDL: 30 mg/dL (ref 0–40)

## 2013-12-19 LAB — HEMOGLOBIN A1C
Hgb A1c MFr Bld: 6.3 % — ABNORMAL HIGH (ref <5.7)
Mean Plasma Glucose: 134 mg/dL — ABNORMAL HIGH (ref <117)

## 2013-12-19 LAB — BASIC METABOLIC PANEL WITH GFR
BUN: 14 mg/dL (ref 6–23)
CALCIUM: 9.7 mg/dL (ref 8.4–10.5)
CO2: 31 mEq/L (ref 19–32)
Chloride: 95 mEq/L — ABNORMAL LOW (ref 96–112)
Creat: 0.91 mg/dL (ref 0.50–1.35)
GFR, EST NON AFRICAN AMERICAN: 81 mL/min
Glucose, Bld: 88 mg/dL (ref 70–99)
Potassium: 4.2 mEq/L (ref 3.5–5.3)
Sodium: 137 mEq/L (ref 135–145)

## 2013-12-19 LAB — VITAMIN D 25 HYDROXY (VIT D DEFICIENCY, FRACTURES): VIT D 25 HYDROXY: 72 ng/mL (ref 30–89)

## 2013-12-19 LAB — INSULIN, FASTING: Insulin fasting, serum: 5.1 u[IU]/mL (ref 2.0–19.6)

## 2013-12-19 LAB — MAGNESIUM: MAGNESIUM: 2 mg/dL (ref 1.5–2.5)

## 2013-12-19 LAB — TSH: TSH: 2.8 u[IU]/mL (ref 0.350–4.500)

## 2014-01-28 ENCOUNTER — Other Ambulatory Visit: Payer: Self-pay | Admitting: Gastroenterology

## 2014-02-01 ENCOUNTER — Other Ambulatory Visit: Payer: Self-pay

## 2014-02-01 MED ORDER — HYDROCHLOROTHIAZIDE 25 MG PO TABS: 25.0000 mg | ORAL_TABLET | Freq: Every day | ORAL | Status: DC

## 2014-02-26 ENCOUNTER — Other Ambulatory Visit: Payer: Self-pay | Admitting: Internal Medicine

## 2014-02-26 DIAGNOSIS — G4700 Insomnia, unspecified: Secondary | ICD-10-CM

## 2014-02-26 DIAGNOSIS — N5201 Erectile dysfunction due to arterial insufficiency: Secondary | ICD-10-CM

## 2014-02-26 DIAGNOSIS — K219 Gastro-esophageal reflux disease without esophagitis: Secondary | ICD-10-CM

## 2014-02-26 MED ORDER — SILDENAFIL CITRATE 20 MG PO TABS: 20.0000 mg | ORAL_TABLET | Freq: Three times a day (TID) | ORAL | Status: DC

## 2014-02-26 MED ORDER — CLONAZEPAM 2 MG PO TABS: ORAL_TABLET | ORAL | Status: DC

## 2014-02-26 MED ORDER — OMEPRAZOLE 40 MG PO CPDR: 40.0000 mg | DELAYED_RELEASE_CAPSULE | Freq: Every day | ORAL | Status: DC

## 2014-02-27 ENCOUNTER — Other Ambulatory Visit: Payer: Self-pay | Admitting: *Deleted

## 2014-02-27 MED ORDER — ALLOPURINOL 300 MG PO TABS: 300.0000 mg | ORAL_TABLET | Freq: Every day | ORAL | Status: DC

## 2014-02-27 MED ORDER — HYDROCHLOROTHIAZIDE 25 MG PO TABS: 25.0000 mg | ORAL_TABLET | Freq: Every day | ORAL | Status: DC

## 2014-02-27 MED ORDER — ATENOLOL 100 MG PO TABS: ORAL_TABLET | ORAL | Status: DC

## 2014-02-27 NOTE — Progress Notes (Signed)
Called into pharmacy

## 2014-03-22 ENCOUNTER — Ambulatory Visit: Payer: Self-pay | Admitting: Internal Medicine

## 2014-03-23 ENCOUNTER — Ambulatory Visit: Payer: Self-pay | Admitting: Physician Assistant

## 2014-03-28 ENCOUNTER — Ambulatory Visit (INDEPENDENT_AMBULATORY_CARE_PROVIDER_SITE_OTHER): Payer: Medicare Other | Admitting: Physician Assistant

## 2014-03-28 ENCOUNTER — Encounter: Payer: Self-pay | Admitting: Physician Assistant

## 2014-03-28 ENCOUNTER — Other Ambulatory Visit: Payer: Self-pay | Admitting: Physician Assistant

## 2014-03-28 VITALS — BP 110/68 | HR 60 | Temp 97.9°F | Resp 16 | Ht 71.0 in | Wt 179.0 lb

## 2014-03-28 DIAGNOSIS — R6889 Other general symptoms and signs: Secondary | ICD-10-CM | POA: Diagnosis not present

## 2014-03-28 DIAGNOSIS — E785 Hyperlipidemia, unspecified: Secondary | ICD-10-CM

## 2014-03-28 DIAGNOSIS — R1310 Dysphagia, unspecified: Secondary | ICD-10-CM

## 2014-03-28 DIAGNOSIS — E291 Testicular hypofunction: Secondary | ICD-10-CM | POA: Diagnosis not present

## 2014-03-28 DIAGNOSIS — Z1331 Encounter for screening for depression: Secondary | ICD-10-CM

## 2014-03-28 DIAGNOSIS — A749 Chlamydial infection, unspecified: Secondary | ICD-10-CM

## 2014-03-28 DIAGNOSIS — Z0001 Encounter for general adult medical examination with abnormal findings: Secondary | ICD-10-CM

## 2014-03-28 DIAGNOSIS — R300 Dysuria: Secondary | ICD-10-CM | POA: Diagnosis not present

## 2014-03-28 DIAGNOSIS — I10 Essential (primary) hypertension: Secondary | ICD-10-CM | POA: Diagnosis not present

## 2014-03-28 DIAGNOSIS — Z202 Contact with and (suspected) exposure to infections with a predominantly sexual mode of transmission: Secondary | ICD-10-CM

## 2014-03-28 DIAGNOSIS — R7309 Other abnormal glucose: Secondary | ICD-10-CM

## 2014-03-28 DIAGNOSIS — G4733 Obstructive sleep apnea (adult) (pediatric): Secondary | ICD-10-CM | POA: Diagnosis not present

## 2014-03-28 DIAGNOSIS — M109 Gout, unspecified: Secondary | ICD-10-CM

## 2014-03-28 DIAGNOSIS — K2100 Gastro-esophageal reflux disease with esophagitis, without bleeding: Secondary | ICD-10-CM

## 2014-03-28 DIAGNOSIS — Z79899 Other long term (current) drug therapy: Secondary | ICD-10-CM

## 2014-03-28 DIAGNOSIS — Z86010 Personal history of colonic polyps: Secondary | ICD-10-CM

## 2014-03-28 DIAGNOSIS — Z23 Encounter for immunization: Secondary | ICD-10-CM

## 2014-03-28 DIAGNOSIS — E559 Vitamin D deficiency, unspecified: Secondary | ICD-10-CM

## 2014-03-28 DIAGNOSIS — R7303 Prediabetes: Secondary | ICD-10-CM

## 2014-03-28 DIAGNOSIS — N5201 Erectile dysfunction due to arterial insufficiency: Secondary | ICD-10-CM

## 2014-03-28 LAB — CBC WITH DIFFERENTIAL/PLATELET
BASOS PCT: 1 % (ref 0–1)
Basophils Absolute: 0.1 10*3/uL (ref 0.0–0.1)
EOS ABS: 0.6 10*3/uL (ref 0.0–0.7)
Eosinophils Relative: 10 % — ABNORMAL HIGH (ref 0–5)
HEMATOCRIT: 44.8 % (ref 39.0–52.0)
Hemoglobin: 15.2 g/dL (ref 13.0–17.0)
LYMPHS ABS: 3.1 10*3/uL (ref 0.7–4.0)
Lymphocytes Relative: 53 % — ABNORMAL HIGH (ref 12–46)
MCH: 30.7 pg (ref 26.0–34.0)
MCHC: 33.9 g/dL (ref 30.0–36.0)
MCV: 90.5 fL (ref 78.0–100.0)
MPV: 9.7 fL (ref 8.6–12.4)
Monocytes Absolute: 0.5 10*3/uL (ref 0.1–1.0)
Monocytes Relative: 8 % (ref 3–12)
NEUTROS PCT: 28 % — AB (ref 43–77)
Neutro Abs: 1.6 10*3/uL — ABNORMAL LOW (ref 1.7–7.7)
Platelets: 244 10*3/uL (ref 150–400)
RBC: 4.95 MIL/uL (ref 4.22–5.81)
RDW: 13.7 % (ref 11.5–15.5)
WBC: 5.8 10*3/uL (ref 4.0–10.5)

## 2014-03-28 MED ORDER — AZITHROMYCIN 250 MG PO TABS: ORAL_TABLET | ORAL | Status: DC

## 2014-03-28 MED ORDER — CEFTRIAXONE SODIUM 250 MG IJ SOLR: 250.0000 mg | Freq: Once | INTRAMUSCULAR | Status: DC

## 2014-03-28 MED ORDER — CEFTRIAXONE SODIUM 1 G IJ SOLR
1.0000 g | Freq: Once | INTRAMUSCULAR | Status: AC
  Administered 2014-03-28: 1 g via INTRAMUSCULAR

## 2014-03-28 MED ORDER — SILDENAFIL CITRATE 20 MG PO TABS
ORAL_TABLET | ORAL | Status: DC
Start: 2014-03-28 — End: 2016-06-22

## 2014-03-28 NOTE — Patient Instructions (Addendum)
Bad carbs also include fruit juice, alcohol, and sweet tea. These are empty calories that do not signal to your brain that you are full.   Please remember the good carbs are still carbs which convert into sugar. So please measure them out no more than 1/2-1 cup of rice, oatmeal, pasta, and beans  Veggies are however free foods! Pile them on.   Not all fruit is created equal. Please see the list below, the fruit at the bottom is higher in sugars than the fruit at the top. Please avoid all dried fruits.      Alcohol Use Disorder Alcohol use disorder is a mental disorder. It is not a one-time incident of heavy drinking. Alcohol use disorder is the excessive and uncontrollable use of alcohol over time that leads to problems with functioning in one or more areas of daily living. People with this disorder risk harming themselves and others when they drink to excess. Alcohol use disorder also can cause other mental disorders, such as mood and anxiety disorders, and serious physical problems. People with alcohol use disorder often misuse other drugs.  Alcohol use disorder is common and widespread. Some people with this disorder drink alcohol to cope with or escape from negative life events. Others drink to relieve chronic pain or symptoms of mental illness. People with a family history of alcohol use disorder are at higher risk of losing control and using alcohol to excess.  SYMPTOMS  Signs and symptoms of alcohol use disorder may include the following:  5. Consumption ofalcohol inlarger amounts or over a longer period of time than intended. 6. Multiple unsuccessful attempts to cutdown or control alcohol use.  7. A great deal of time spent obtaining alcohol, using alcohol, or recovering from the effects of alcohol (hangover). 8. A strong desire or urge to use alcohol (cravings).  9. Continued use of alcohol despite problems at work, school, or home because of alcohol use.  10. Continued use  of alcohol despite problems in relationships because of alcohol use. 11. Continued use of alcohol in situations when it is physically hazardous, such as driving a car. 12. Continued use of alcohol despite awareness of a physical or psychological problem that is likely related to alcohol use. Physical problems related to alcohol use can involve the brain, heart, liver, stomach, and intestines. Psychological problems related to alcohol use include intoxication, depression, anxiety, psychosis, delirium, and dementia.  13. The need for increased amounts of alcohol to achieve the same desired effect, or a decreased effect from the consumption of the same amount of alcohol (tolerance). 14. Withdrawal symptoms upon reducing or stopping alcohol use, or alcohol use to reduce or avoid withdrawal symptoms. Withdrawal symptoms include: 1. Racing heart. 2. Hand tremor. 3. Difficulty sleeping. 4. Nausea. 5. Vomiting. 6. Hallucinations. 7. Restlessness. 8. Seizures. DIAGNOSIS Alcohol use disorder is diagnosed through an assessment by your health care provider. Your health care provider may start by asking three or four questions to screen for excessive or problematic alcohol use. To confirm a diagnosis of alcohol use disorder, at least two symptoms must be present within a 14-month period. The severity of alcohol use disorder depends on the number of symptoms:  Mild--two or three.  Moderate--four or five.  Severe--six or more. Your health care provider may perform a physical exam or use results from lab tests to see if you have physical problems resulting from alcohol use. Your health care provider may refer you to a mental health professional for  evaluation. TREATMENT  Some people with alcohol use disorder are able to reduce their alcohol use to low-risk levels. Some people with alcohol use disorder need to quit drinking alcohol. When necessary, mental health professionals with specialized training in  substance use treatment can help. Your health care provider can help you decide how severe your alcohol use disorder is and what type of treatment you need. The following forms of treatment are available:   Detoxification. Detoxification involves the use of prescription medicines to prevent alcohol withdrawal symptoms in the first week after quitting. This is important for people with a history of symptoms of withdrawal and for heavy drinkers who are likely to have withdrawal symptoms. Alcohol withdrawal can be dangerous and, in severe cases, cause death. Detoxification is usually provided in a hospital or in-patient substance use treatment facility.  Counseling or talk therapy. Talk therapy is provided by substance use treatment counselors. It addresses the reasons people use alcohol and ways to keep them from drinking again. The goals of talk therapy are to help people with alcohol use disorder find healthy activities and ways to cope with life stress, to identify and avoid triggers for alcohol use, and to handle cravings, which can cause relapse.  Medicines.Different medicines can help treat alcohol use disorder through the following actions:  Decrease alcohol cravings.  Decrease the positive reward response felt from alcohol use.  Produce an uncomfortable physical reaction when alcohol is used (aversion therapy).  Support groups. Support groups are run by people who have quit drinking. They provide emotional support, advice, and guidance. These forms of treatment are often combined. Some people with alcohol use disorder benefit from intensive combination treatment provided by specialized substance use treatment centers. Both inpatient and outpatient treatment programs are available. Document Released: 03/26/2004 Document Revised: 07/03/2013 Document Reviewed: 05/26/2012 [facility] Patient Information 2015 [place], [place]. This information is not intended to replace advice given to you by your  health care provider. Make sure you discuss any questions you have with your health care provider.

## 2014-03-28 NOTE — Progress Notes (Signed)
MEDICARE ANNUAL WELLNESS VISIT AND FOLLOW UP Assessment:   1. Essential hypertension - continue medications, DASH diet, exercise and monitor at home. Call if greater than 130/80.  - CBC with Differential/Platelet - BASIC METABOLIC PANEL WITH GFR - Hepatic function panel - TSH  2. Dysphagia Continue GERD med  3. Prediabetes Discussed general issues about diabetes pathophysiology and management., Educational material distributed., Suggested low cholesterol diet., Encouraged aerobic exercise., Discussed foot care., Reminded to get yearly retinal exam. - Hemoglobin A1c - HM DIABETES FOOT EXAM  4. Hyperlipidemia -continue medications, check lipids, decrease fatty foods, increase activity.  - Lipid panel  5. 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  7. History of colonic polyps Due 2019  8. Vitamin D deficiency - Vit D  25 hydroxy (rtn osteoporosis monitoring)  9. Encounter for long-term (current) use of medications - Magnesium  10. Exposure to STD Discussion about safe sex and check for other STDS - RPR - HIV antibody - HSV(herpes simplex vrs) 1+2 ab-IgG - GC/chlamydia probe amp, urine - cefTRIAXone (ROCEPHIN) injection 250 mg; Inject 250 mg into the muscle once.  11. Chlamydia Discussion about safe sex and will check other STDS - azithromycin (ZITHROMAX) 250 MG tablet; Take 4 pills by mouth once  Dispense: 4 tablet; Refill: 0 - cefTRIAXone (ROCEPHIN) injection 250 mg; Inject 250 mg into the muscle once.  12. Need for prophylactic vaccination against Streptococcus pneumoniae (pneumococcus) - Pneumococcal conjugate vaccine 13-valent IM   Plan:   During the course of the visit the patient was educated and counseled about appropriate  screening and preventive services including:    Pneumococcal vaccine   Influenza vaccine  Td vaccine  Screening electrocardiogram  Colorectal cancer screening  Diabetes screening  Glaucoma screening  Nutrition counseling   Screening recommendations, referrals: Vaccinations: Please see documentation below and orders this visit.  Nutrition assessed and recommended  Colonoscopy due 2019 Recommended yearly ophthalmology/optometry visit for glaucoma screening and checkup Recommended yearly dental visit for hygiene and checkup Advanced directives - requested  Conditions/risks identified: BMI: Discussed weight loss, diet, and increase physical activity.  Increase physical activity: AHA recommends 150 minutes of physical activity a week.  Medications reviewed Diabetes is at goal, ACE/ARB therapy: No, Reason not on Ace Inhibitor/ARB therapy:  PreDM only Urinary Incontinence is not an issue: discussed non pharmacology and pharmacology options.  Fall risk: low- discussed PT, home fall assessment, medications.    Subjective:  Jeremy Boone is a 80 y.o. male who presents for Medicare Annual Wellness Visit and 3 month follow up for HTN, hyperlipidemia, prediabetes, and vitamin D Def.  Date of last medicare wellness visit was 06/08/2013.  His blood pressure has been controlled at home, today their BP is BP: 110/68 mmHg He does workout. He denies chest pain, shortness of breath, dizziness.  He is on cholesterol medication, lopid 600 once daily due to intolerance of statins and denies myalgias. His cholesterol is not at goal. The cholesterol last visit was:   Lab Results  Component Value Date   CHOL 233* 12/18/2013   HDL 73 12/18/2013   LDLCALC 130* 12/18/2013   TRIG 151* 12/18/2013   CHOLHDL 3.2 12/18/2013  He has been working on diet and exercise for prediabetes, and denies paresthesia of the feet, polydipsia, polyuria and visual disturbances. Last A1C in the office was:  Lab  Results  Component Value Date   HGBA1C 6.3* 12/18/2013  Patient is on Vitamin D supplement.   Lab Results  Component Value Date   VD25OH 72 12/18/2013   He has ED and uses viagra PRN, he states that his sexual partner was diagnosed with  Patient is on allopurinol for gout and does not report a recent flare.  He is on klonopin as needed for insomnia.  He does not smoke but admits to drinking alcohol on a regular basis, he had dysphagia last year, saw Dr. Arlyce Dice had EG esophagus and is now on prilosec for GERD which helps.     Names of Other Physician/Practitioners you currently use: 1. [facility] here for primary care Patient Care Team: [name], MD as PCP - General (Internal Medicine) Lamar Benes, MD as Consulting Physician (Ophthalmology)- Overdue Drucilla Schmidt, MD as Consulting Physician (Orthopedic Surgery) Louis Meckel, MD as Consulting Physician (Gastroenterology)  Medication Review: Current Outpatient Prescriptions on File Prior to Visit  Medication Sig Dispense Refill  . allopurinol (ZYLOPRIM) 300 MG tablet Take 1 tablet (300 mg total) by mouth daily. 90 tablet 2  . atenolol (TENORMIN) 100 MG tablet TAKE 1 TABLET (100 MG TOTAL) BY MOUTH DAILY. 90 tablet 2  . Cholecalciferol (VITAMIN D PO) Take 5,000 Int'l Units by mouth. Takes every other day    . clonazePAM (KLONOPIN) 2 MG tablet Take 1/2 to 1 tablet at bedtime as need for sleep 30 tablet 5  . Flaxseed, Linseed, (FLAX SEED OIL PO) Take by mouth 2 (two) times daily.     Marland Kitchen gemfibrozil (LOPID) 600 MG tablet Takes 1 daily    . hydrochlorothiazide (HYDRODIURIL) 25 MG tablet Take 1 tablet (25 mg total) by mouth daily. 100 tablet 2  . Omega-3 Fatty Acids (FISH OIL PO) Take by mouth 2 (two) times daily.     Marland Kitchen omeprazole (PRILOSEC) 40 MG capsule Take 1 capsule (40 mg total) by mouth daily. For acid indigestion & reflux 90 capsule 99  . sildenafil (REVATIO) 20 MG tablet Take 1  tablet (20 mg total) by mouth 3 (three) times daily. 10 tablet 0   No current facility-administered medications on file prior to visit.    Current Problems (verified) Patient Active Problem List   Diagnosis Date Noted  . Vitamin D deficiency 09/14/2013  . Encounter for long-term (current) use of medications 09/14/2013  . Dysphagia 08/02/2013  . History of colonic polyps 08/02/2013  . Esophageal reflux 08/02/2013  . Hyperlipidemia   . Hypertension   . Gout   . Prediabetes     Screening Tests Health Maintenance  Topic Date Due  . ZOSTAVAX  03/18/1996  . TETANUS/TDAP  03/02/2014  . INFLUENZA VACCINE  10/01/2014  . COLONOSCOPY  10/12/2017  . PNEUMOCOCCAL POLYSACCHARIDE VACCINE AGE 72 AND OVER  Completed    Immunization History  Administered Date(s) Administered  . Influenza, High Dose Seasonal PF 12/18/2013  . Influenza,inj,Quad PF,36+ Mos 03/07/2013  . Pneumococcal-Unspecified 03/03/2007  . Td 03/02/2004    Preventative care: Last colonoscopy: 10/13/07 DUE 2019, polyps, diverticulosis EGD: 2015 Dr. Arlyce Dice CXR: 2007 CT head 2010 CT lumbar 2009  Prior vaccinations: TD or Tdap: 2006 due next year Influenza: 03/07/13 Pneumococcal: 2009 Prevnar 13: DUE Shingles/Zostavax: Declines due to cost   Past Surgical History  Procedure Laterality Date  . Carpal tunnel release Left 2007  . Rotator cuff repair Left 2000  . Other surgical history  2007    Negative  biopsy tonsil mass   Family History  Problem Relation Age of Onset  . Hypertension Mother   . Stroke Father   . Seizures Sister     History reviewed: allergies, current medications, past family history, past medical history, past social history, past surgical history and problem list   Risk Factors: Tobacco History  Substance Use Topics  . Smoking status: Never Smoker   . Smokeless tobacco: Never Used  . Alcohol Use: 7.5 oz/week    15 drink(s) per week   He does not smoke.  Patient is not a former  smoker. Are there smokers in your home (other than you)?  No  Alcohol Current alcohol use: 2 liquor drinks per day(s)  Caffeine Current caffeine use: coffee 2 /day  Exercise Current exercise: housecleaning, walking and yard work  Nutrition/Diet Current diet: in general, a "healthy" diet    Cardiac risk factors: advanced age (older than 66 for men, 77 for women), dyslipidemia, family history of premature cardiovascular disease, hypertension, male gender and sedentary lifestyle.  Depression Screen (Note: if answer to either of the following is "Yes", a more complete depression screening is indicated)   Q1: Over the past two weeks, have you felt down, depressed or hopeless? No  Q2: Over the past two weeks, have you felt little interest or pleasure in doing things? No  Have you lost interest or pleasure in daily life? No  Do you often feel hopeless? No  Do you cry easily over simple problems? No  Activities of Daily Living In your present state of health, do you have any difficulty performing the following activities?:  Driving? No Managing money?  No Feeding yourself? No Getting from bed to chair? No Climbing a flight of stairs? No Preparing food and eating?: No Bathing or showering? No Getting dressed: No Getting to the toilet? No Using the toilet:No Moving around from place to place: No In the past year have you fallen or had a near fall?:No   Are you sexually active?  Yes  Do you have more than one partner?  No  Vision Difficulties: No  Hearing Difficulties: No Do you often ask people to speak up or repeat themselves? No Do you experience ringing or noises in your ears? Yes Do you have difficulty understanding soft or whispered voices? No  Cognition  Do you feel that you have a problem with memory?No  Do you often misplace items? No  Do you feel safe at home?  Yes  Advanced directives Does patient have a Health Care Power of Attorney? No Does patient have a  Living Will? No   Objective:   Blood pressure 110/68, pulse 60, temperature 97.9 F (36.6 C), resp. rate 16, height 5\' 11"  (1.803 m), weight 179 lb (81.194 kg). Body mass index is 24.98 kg/(m^2).  General appearance: alert, no distress, WD/WN, male Cognitive Testing  Alert? Yes  Normal Appearance?Yes  Oriented to person? Yes  Place? Yes   Time? Yes  Recall of three objects?  Yes  Can perform simple calculations? Yes  Displays appropriate judgment?Yes  Can read the correct time from a watch face?Yes  HEENT: normocephalic, sclerae anicteric, TMs pearly, nares patent, no discharge or erythema, pharynx normal Oral cavity: MMM, no lesions Neck: supple, no lymphadenopathy, no thyromegaly, no masses Heart: RRR, normal S1, S2, no murmurs Lungs: CTA bilaterally, no wheezes, rhonchi, or rales Abdomen: +bs, soft, non tender, non distended, no masses, no hepatomegaly, no splenomegaly Musculoskeletal: nontender, no swelling, no obvious deformity Extremities:  no edema, no cyanosis, no clubbing Pulses: 2+ symmetric, upper and lower extremities, normal cap refill Neurological: alert, oriented x 3, CN2-12 intact, strength normal upper extremities and lower extremities, sensation normal throughout, DTRs 2+ throughout, no cerebellar signs, gait normal Psychiatric: normal affect, behavior normal, pleasant   Medicare Attestation I have personally reviewed: The patient's medical and social history Their use of alcohol, tobacco or illicit drugs Their current medications and supplements The patient's functional ability including ADLs,fall risks, home safety risks, cognitive, and hearing and visual impairment Diet and physical activities Evidence for depression or mood disorders  The patient's weight, height, BMI, and visual acuity have been recorded in the chart.  I have made referrals, counseling, and provided education to the patient based on review of the above and I have provided the patient with  a written personalized care plan for preventive services.     Quentin MullingCollier, Nyelli Samara, PA-C   03/28/2014

## 2014-03-29 LAB — BASIC METABOLIC PANEL WITH GFR
BUN: 15 mg/dL (ref 6–23)
CALCIUM: 9.3 mg/dL (ref 8.4–10.5)
CO2: 30 mEq/L (ref 19–32)
Chloride: 96 mEq/L (ref 96–112)
Creat: 1 mg/dL (ref 0.50–1.35)
GFR, Est African American: 83 mL/min
GFR, Est Non African American: 72 mL/min
Glucose, Bld: 74 mg/dL (ref 70–99)
POTASSIUM: 3.9 meq/L (ref 3.5–5.3)
Sodium: 137 mEq/L (ref 135–145)

## 2014-03-29 LAB — HEPATIC FUNCTION PANEL
ALT: 11 U/L (ref 0–53)
AST: 24 U/L (ref 0–37)
Albumin: 4.2 g/dL (ref 3.5–5.2)
Alkaline Phosphatase: 68 U/L (ref 39–117)
BILIRUBIN INDIRECT: 0.3 mg/dL (ref 0.2–1.2)
Bilirubin, Direct: 0.1 mg/dL (ref 0.0–0.3)
Total Bilirubin: 0.4 mg/dL (ref 0.2–1.2)
Total Protein: 7.3 g/dL (ref 6.0–8.3)

## 2014-03-29 LAB — HSV(HERPES SIMPLEX VRS) I + II AB-IGG
HSV 1 GLYCOPROTEIN G AB, IGG: 7.33 IV — AB
HSV 2 GLYCOPROTEIN G AB, IGG: 6.81 IV — AB

## 2014-03-29 LAB — LIPID PANEL
Cholesterol: 218 mg/dL — ABNORMAL HIGH (ref 0–200)
HDL: 62 mg/dL (ref >39)
LDL Cholesterol: 138 mg/dL — ABNORMAL HIGH (ref 0–99)
Total CHOL/HDL Ratio: 3.5 Ratio
Triglycerides: 92 mg/dL (ref <150)
VLDL: 18 mg/dL (ref 0–40)

## 2014-03-29 LAB — TSH: TSH: 2.801 u[IU]/mL (ref 0.350–4.500)

## 2014-03-29 LAB — HIV ANTIBODY (ROUTINE TESTING W REFLEX): HIV 1&2 Ab, 4th Generation: NONREACTIVE

## 2014-03-29 LAB — RPR

## 2014-03-29 LAB — HEMOGLOBIN A1C
Hgb A1c MFr Bld: 6.2 % — ABNORMAL HIGH (ref <5.7)
MEAN PLASMA GLUCOSE: 131 mg/dL — AB (ref <117)

## 2014-03-29 LAB — TESTOSTERONE: TESTOSTERONE: 202 ng/dL — AB (ref 300–890)

## 2014-03-29 LAB — GC/CHLAMYDIA PROBE AMP, URINE
Chlamydia, Swab/Urine, PCR: NEGATIVE
GC PROBE AMP, URINE: NEGATIVE

## 2014-03-29 LAB — MAGNESIUM: Magnesium: 2 mg/dL (ref 1.5–2.5)

## 2014-03-29 LAB — VITAMIN D 25 HYDROXY (VIT D DEFICIENCY, FRACTURES): VIT D 25 HYDROXY: 50 ng/mL (ref 30–100)

## 2014-03-30 DIAGNOSIS — G4733 Obstructive sleep apnea (adult) (pediatric): Secondary | ICD-10-CM | POA: Diagnosis not present

## 2014-04-01 ENCOUNTER — Other Ambulatory Visit: Payer: Self-pay | Admitting: Internal Medicine

## 2014-04-23 ENCOUNTER — Other Ambulatory Visit: Payer: Self-pay | Admitting: Internal Medicine

## 2014-05-09 DIAGNOSIS — Z8669 Personal history of other diseases of the nervous system and sense organs: Secondary | ICD-10-CM | POA: Diagnosis not present

## 2014-05-15 DIAGNOSIS — H1132 Conjunctival hemorrhage, left eye: Secondary | ICD-10-CM | POA: Diagnosis not present

## 2014-05-16 ENCOUNTER — Encounter: Payer: Self-pay | Admitting: Physician Assistant

## 2014-05-29 DIAGNOSIS — H1132 Conjunctival hemorrhage, left eye: Secondary | ICD-10-CM | POA: Diagnosis not present

## 2014-06-27 ENCOUNTER — Ambulatory Visit (INDEPENDENT_AMBULATORY_CARE_PROVIDER_SITE_OTHER): Payer: Medicare Other | Admitting: Internal Medicine

## 2014-06-27 ENCOUNTER — Other Ambulatory Visit: Payer: Self-pay | Admitting: Internal Medicine

## 2014-06-27 ENCOUNTER — Encounter: Payer: Self-pay | Admitting: Internal Medicine

## 2014-06-27 VITALS — BP 110/74 | HR 64 | Temp 97.5°F | Resp 16 | Ht 71.0 in | Wt 180.2 lb

## 2014-06-27 DIAGNOSIS — I10 Essential (primary) hypertension: Secondary | ICD-10-CM | POA: Diagnosis not present

## 2014-06-27 DIAGNOSIS — E559 Vitamin D deficiency, unspecified: Secondary | ICD-10-CM

## 2014-06-27 DIAGNOSIS — M109 Gout, unspecified: Secondary | ICD-10-CM | POA: Diagnosis not present

## 2014-06-27 DIAGNOSIS — R7309 Other abnormal glucose: Secondary | ICD-10-CM | POA: Diagnosis not present

## 2014-06-27 DIAGNOSIS — E785 Hyperlipidemia, unspecified: Secondary | ICD-10-CM

## 2014-06-27 DIAGNOSIS — Z79899 Other long term (current) drug therapy: Secondary | ICD-10-CM

## 2014-06-27 DIAGNOSIS — R7303 Prediabetes: Secondary | ICD-10-CM

## 2014-06-27 LAB — LIPID PANEL
Cholesterol: 219 mg/dL — ABNORMAL HIGH (ref 0–200)
HDL: 68 mg/dL (ref >=40)
LDL Cholesterol: 127 mg/dL — ABNORMAL HIGH (ref 0–99)
TRIGLYCERIDES: 119 mg/dL (ref <150)
Total CHOL/HDL Ratio: 3.2 Ratio
VLDL: 24 mg/dL (ref 0–40)

## 2014-06-27 LAB — HEPATIC FUNCTION PANEL
ALT: 12 U/L (ref 0–53)
AST: 25 U/L (ref 0–37)
Albumin: 4.1 g/dL (ref 3.5–5.2)
Alkaline Phosphatase: 74 U/L (ref 39–117)
BILIRUBIN DIRECT: 0.1 mg/dL (ref 0.0–0.3)
BILIRUBIN INDIRECT: 0.3 mg/dL (ref 0.2–1.2)
BILIRUBIN TOTAL: 0.4 mg/dL (ref 0.2–1.2)
Total Protein: 7.4 g/dL (ref 6.0–8.3)

## 2014-06-27 LAB — CBC WITH DIFFERENTIAL/PLATELET
BASOS ABS: 0.1 10*3/uL (ref 0.0–0.1)
Basophils Relative: 1 % (ref 0–1)
Eosinophils Absolute: 0.5 10*3/uL (ref 0.0–0.7)
Eosinophils Relative: 10 % — ABNORMAL HIGH (ref 0–5)
HCT: 43.1 % (ref 39.0–52.0)
Hemoglobin: 14.5 g/dL (ref 13.0–17.0)
LYMPHS PCT: 43 % (ref 12–46)
Lymphs Abs: 2.2 10*3/uL (ref 0.7–4.0)
MCH: 30.5 pg (ref 26.0–34.0)
MCHC: 33.6 g/dL (ref 30.0–36.0)
MCV: 90.5 fL (ref 78.0–100.0)
MPV: 9.4 fL (ref 8.6–12.4)
Monocytes Absolute: 0.5 10*3/uL (ref 0.1–1.0)
Monocytes Relative: 9 % (ref 3–12)
NEUTROS ABS: 1.9 10*3/uL (ref 1.7–7.7)
NEUTROS PCT: 37 % — AB (ref 43–77)
PLATELETS: 258 10*3/uL (ref 150–400)
RBC: 4.76 MIL/uL (ref 4.22–5.81)
RDW: 13.9 % (ref 11.5–15.5)
WBC: 5.1 10*3/uL (ref 4.0–10.5)

## 2014-06-27 LAB — BASIC METABOLIC PANEL WITH GFR
BUN: 18 mg/dL (ref 6–23)
CO2: 29 meq/L (ref 19–32)
Calcium: 9.4 mg/dL (ref 8.4–10.5)
Chloride: 97 mEq/L (ref 96–112)
Creat: 1.07 mg/dL (ref 0.50–1.35)
GFR, EST NON AFRICAN AMERICAN: 66 mL/min
GFR, Est African American: 76 mL/min
Glucose, Bld: 108 mg/dL — ABNORMAL HIGH (ref 70–99)
Potassium: 3.6 mEq/L (ref 3.5–5.3)
SODIUM: 136 meq/L (ref 135–145)

## 2014-06-27 LAB — URIC ACID: Uric Acid, Serum: 5 mg/dL (ref 4.0–7.8)

## 2014-06-27 LAB — MAGNESIUM: MAGNESIUM: 2.1 mg/dL (ref 1.5–2.5)

## 2014-06-27 NOTE — Progress Notes (Signed)
Patient ID: Jeremy Boone, male   DOB: [birth year], 80 y.o.   MRN: [id number]   This very nice 80 y.o. presents for 3 month follow up with Hypertension, Hyperlipidemia, Pre-Diabetes and Vitamin D Deficiency. Patient also has OSA and only uses his CPAP sporadically.    Patient is treated for HTN & BP has been controlled at home. Today's BP: 110/74 mmHg. Patient has had no complaints of any cardiac type chest pain, palpitations, dyspnea/orthopnea/PND, dizziness, claudication, or dependent edema.   Hyperlipidemia is controlled with diet & meds. Patient denies myalgias or other med SE's. Last Lipids were not at goal  Chol Total 218; HDL 62; LDL 138; Trig  92 on 03/28/2014.   Also, the patient has history of PreDiabetes with A1c 6.3% in 2011and has had no symptoms of reactive hypoglycemia, diabetic polys, paresthesias or visual blurring.  Last A1c was  6.2% on  03/28/2014.   Further, the patient also has history of Vitamin D Deficiency and supplements vitamin D without any suspected side-effects. Last vitamin D was  50 on 03/28/2014.     Medication Sig  . allopurinol (ZYLOPRIM) 300 MG tablet TAKE 1 TABLET BY MOUTH EVERY DAY TO PREVENT GOUT  . atenolol (TENORMIN) 100 MG tablet TAKE 1 TABLET (100 MG TOTAL) BY MOUTH DAILY.  Marland Kitchen. Flaxseed, Linseed, (FLAX SEED OIL PO) Take by mouth 2 (two) times daily.   Marland Kitchen. gemfibrozil (LOPID) 600 MG tablet TAKE 1 TABLET BY MOUTH TWICE A DAY  . hydrochlorothiazide (HYDRODIURIL) 25 MG tablet Take 1 tablet (25 mg total) by mouth daily.  . Omega-3 Fatty Acids (FISH OIL PO) Take by mouth 2 (two) times daily.   Marland Kitchen. omeprazole (PRILOSEC) 40 MG capsule Take 1 capsule (40 mg total) by mouth daily. For acid indigestion & reflux  . sildenafil (REVATIO) 20 MG tablet 1/2 -1 tablet as needed once daily prior to erection  . clonazePAM (KLONOPIN) 2 MG tablet Take 1/2 to 1 tablet at bedtime as need for sleep  . Cholecalciferol (VITAMIN D PO) Take 5,000 Int'l Units by mouth. Takes every other day   . atenolol (TENORMIN) 100 MG tablet TAKE 1 TABLET (100 MG TOTAL) BY MOUTH DAILY.  Marland Kitchen. azithromycin (ZITHROMAX) 250 MG tablet Take 4 pills by mouth once   Allergies  Allergen Reactions  . Ace Inhibitors Cough  . Lipitor [Atorvastatin]     Elevates CPK and Aldolase   PMHx:   Past Medical History  Diagnosis Date  . Hyperlipidemia   . Hypertension   . Gout   . Prediabetes    Immunization History  Administered Date(s) Administered  . Influenza, High Dose Seasonal PF 12/18/2013  . Influenza,inj,Quad PF,36+ Mos 03/07/2013  . Pneumococcal Conjugate-13 03/28/2014  . Pneumococcal-Unspecified 03/03/2007  . Td 03/02/2004   Past Surgical History  Procedure Laterality Date  . Carpal tunnel release Left 2007  . Rotator cuff repair Left 2000  . Other surgical history  2007    Negative biopsy tonsil mass   FHx:    Reviewed / unchanged  SHx:    Reviewed / unchanged  Systems Review:  Constitutional: Denies fever, chills, wt changes, headaches, insomnia, fatigue, night sweats, change in appetite. Eyes: Denies redness, blurred vision, diplopia, discharge, itchy, watery eyes.  ENT: Denies discharge, congestion, post nasal drip, epistaxis, sore throat, earache, hearing loss, dental pain, tinnitus, vertigo, sinus pain, snoring.  CV: Denies chest pain, palpitations, irregular heartbeat, syncope, dyspnea, diaphoresis, orthopnea, PND, claudication or edema. Respiratory: denies cough, dyspnea, DOE, pleurisy, hoarseness, laryngitis, wheezing.  Gastrointestinal: Denies dysphagia, odynophagia, heartburn, reflux, water brash, abdominal pain or cramps, nausea, vomiting, bloating, diarrhea, constipation, hematemesis, melena, hematochezia  or hemorrhoids. Genitourinary: Denies dysuria, frequency, urgency, nocturia, hesitancy, discharge, hematuria or flank pain. Musculoskeletal: Denies arthralgias, myalgias, stiffness, jt. swelling, pain, limping or strain/sprain.  Skin: Denies pruritus, rash, hives, warts,  acne, eczema or change in skin lesion(s). Neuro: No weakness, tremor, incoordination, spasms, paresthesia or pain. Psychiatric: Denies confusion, memory loss or sensory loss. Endo: Denies change in weight, skin or hair change.  Heme/Lymph: No excessive bleeding, bruising or enlarged lymph nodes.  Physical Exam  BP 110/74   Pulse 64  Temp  97.5 F   Resp 16  Ht    Wt 180 lb 3.2 oz     BMI 25.14   Appears well nourished and in no distress. Eyes: PERRLA, EOMs, conjunctiva no swelling or erythema. Sinuses: No frontal/maxillary tenderness ENT/Mouth: EAC's clear, TM's nl w/o erythema, bulging. Nares clear w/o erythema, swelling, exudates. Oropharynx clear without erythema or exudates. Oral hygiene is good. Tongue normal, non obstructing. Hearing intact.  Neck: Supple. Thyroid nl. Car 2+/2+ without bruits, nodes or JVD. Chest: Respirations nl with BS clear & equal w/o rales, rhonchi, wheezing or stridor.  Cor: Heart sounds normal w/ regular rate and rhythm without sig. murmurs, gallops, clicks, or rubs. Peripheral pulses normal and equal  without edema.  Abdomen: Soft & bowel sounds normal. Non-tender w/o guarding, rebound, hernias, masses, or organomegaly.  Lymphatics: Unremarkable.  Musculoskeletal: Full ROM all peripheral extremities, joint stability, 5/5 strength, and normal gait.  Skin: Warm, dry without exposed rashes, lesions or ecchymosis apparent.  Neuro: Cranial nerves intact, reflexes equal bilaterally. Sensory-motor testing grossly intact. Tendon reflexes grossly intact.  Pysch: Alert & oriented x 3.  Insight and judgement nl & appropriate. No ideations.  Assessment and Plan:  1. Essential hypertension  - TSH  2. Hyperlipidemia  - Lipid panel  3. Prediabetes  - Hemoglobin A1c - Insulin, random  4. Vitamin D deficiency  - Vit D  25 hydroxy  5. Gout   - Uric acid  6. Encounter for long-term (current) use of medications  - CBC with  Differential/Platelet - BASIC METABOLIC PANEL WITH GFR - Hepatic function panel - Magnesium   Recommended regular exercise, BP monitoring, weight control, and discussed med and SE's. Recommended labs to assess and monitor clinical status. Further disposition pending results of labs. Over 30 minutes of exam, counseling, chart review was performed

## 2014-06-27 NOTE — Patient Instructions (Signed)

## 2014-06-28 LAB — VITAMIN D 25 HYDROXY (VIT D DEFICIENCY, FRACTURES): Vit D, 25-Hydroxy: 32 ng/mL (ref 30–100)

## 2014-06-28 LAB — HEMOGLOBIN A1C
Hgb A1c MFr Bld: 6.4 % — ABNORMAL HIGH (ref <5.7)
Mean Plasma Glucose: 137 mg/dL — ABNORMAL HIGH (ref <117)

## 2014-06-28 LAB — TSH: TSH: 2.473 u[IU]/mL (ref 0.350–4.500)

## 2014-06-28 LAB — INSULIN, RANDOM: Insulin: 26.4 u[IU]/mL — ABNORMAL HIGH (ref 2.0–19.6)

## 2014-07-01 ENCOUNTER — Encounter: Payer: Self-pay | Admitting: Gastroenterology

## 2014-07-07 ENCOUNTER — Other Ambulatory Visit: Payer: Self-pay | Admitting: Internal Medicine

## 2014-09-24 ENCOUNTER — Ambulatory Visit (INDEPENDENT_AMBULATORY_CARE_PROVIDER_SITE_OTHER): Payer: Medicare Other | Admitting: Internal Medicine

## 2014-09-24 ENCOUNTER — Encounter: Payer: Self-pay | Admitting: Internal Medicine

## 2014-09-24 VITALS — BP 136/82 | HR 72 | Temp 97.7°F | Resp 16 | Ht 71.5 in | Wt 179.2 lb

## 2014-09-24 DIAGNOSIS — Z79899 Other long term (current) drug therapy: Secondary | ICD-10-CM

## 2014-09-24 DIAGNOSIS — M79672 Pain in left foot: Secondary | ICD-10-CM

## 2014-09-24 DIAGNOSIS — Z0000 Encounter for general adult medical examination without abnormal findings: Secondary | ICD-10-CM | POA: Diagnosis not present

## 2014-09-24 DIAGNOSIS — E785 Hyperlipidemia, unspecified: Secondary | ICD-10-CM

## 2014-09-24 DIAGNOSIS — M1000 Idiopathic gout, unspecified site: Secondary | ICD-10-CM

## 2014-09-24 DIAGNOSIS — Z125 Encounter for screening for malignant neoplasm of prostate: Secondary | ICD-10-CM

## 2014-09-24 DIAGNOSIS — I10 Essential (primary) hypertension: Secondary | ICD-10-CM | POA: Diagnosis not present

## 2014-09-24 DIAGNOSIS — Z1331 Encounter for screening for depression: Secondary | ICD-10-CM

## 2014-09-24 DIAGNOSIS — E559 Vitamin D deficiency, unspecified: Secondary | ICD-10-CM

## 2014-09-24 DIAGNOSIS — Z1212 Encounter for screening for malignant neoplasm of rectum: Secondary | ICD-10-CM

## 2014-09-24 DIAGNOSIS — Z6825 Body mass index (BMI) 25.0-25.9, adult: Secondary | ICD-10-CM

## 2014-09-24 DIAGNOSIS — M109 Gout, unspecified: Secondary | ICD-10-CM | POA: Diagnosis not present

## 2014-09-24 DIAGNOSIS — R7309 Other abnormal glucose: Secondary | ICD-10-CM | POA: Diagnosis not present

## 2014-09-24 DIAGNOSIS — K219 Gastro-esophageal reflux disease without esophagitis: Secondary | ICD-10-CM

## 2014-09-24 DIAGNOSIS — Z9181 History of falling: Secondary | ICD-10-CM

## 2014-09-24 DIAGNOSIS — R7303 Prediabetes: Secondary | ICD-10-CM

## 2014-09-24 NOTE — Patient Instructions (Addendum)

## 2014-09-24 NOTE — Progress Notes (Signed)
Patient ID: Jeremy Boone, male   DOB: August 31, 1934, 80 y.o.   MRN: [id number]   Comprehensive Examination  This very nice 80 y.o. Sep BM presents for complete physical.  Patient has been followed for HTN, Prediabetes, Hyperlipidemia, and Vitamin D Deficiency.   HTN predates since 1998. Patient's BP has been controlled at home.Today's BP: 136/82 mmHg. Patient denies any cardiac symptoms as chest pain, palpitations, shortness of breath, dizziness or ankle swelling.   Patient's hyperlipidemia is not controlled with diet and medications (Patient is statin intolerant with elevated CPK's). Patient denies myalgias or other medication SE's on Gemfibrozil. Last lipids were not at goal - Cholesterol 219; HDL 68; LDL 127*; Triglycerides 119 on 06/27/2014.   Patient has prediabetes since 2011 with A1c 6.3%  and patient denies reactive hypoglycemic symptoms, visual blurring, diabetic polys or paresthesias. Last A1c was  6.4% on 06/27/2014.       Finally, patient has history of Vitamin D Deficiency of 25 in 2008 and last vitamin D was  32 on 4/27/201.      Medication Sig  . allopurinol (ZYLOPRIM) 300 MG tablet TAKE 1 TABLET BY MOUTH EVERY DAY TO PREVENT GOUT  . atenolol (TENORMIN) 100 MG tablet TAKE 1 TABLET (100 MG TOTAL) BY MOUTH DAILY.  Marland Kitchen Cholecalciferol (VITAMIN D PO) Take 5,000 Int'l Units by mouth. Takes every other day  . clonazePAM (KLONOPIN) 2 MG tablet TAKE 1 TABLET BY MOUTH AT BEDTIME AS NEEDED FOR SLEEP  . Flaxseed, Linseed, (FLAX SEED OIL PO) Take by mouth 2 (two) times daily.   Marland Kitchen gemfibrozil (LOPID) 600 MG tablet TAKE 1 TABLET BY MOUTH TWICE A DAY  . hydrochlorothiazide (HYDRODIURIL) 25 MG tablet Take 1 tablet (25 mg total) by mouth daily.  . Omega-3 Fatty Acids (FISH OIL PO) Take by mouth 2 (two) times daily.   Marland Kitchen omeprazole (PRILOSEC) 40 MG capsule Take 1 capsule (40 mg total) by mouth daily. For acid indigestion & reflux  . sildenafil (REVATIO) 20 MG tablet 1/2 -1 tablet as needed once daily  prior to erection   Allergies  Allergen Reactions  . Ace Inhibitors Cough  . Lipitor [Atorvastatin]     Elevates CPK and Aldolase   Past Medical History  Diagnosis Date  . Hyperlipidemia   . Hypertension   . Gout   . Prediabetes    Health Maintenance  Topic Date Due  . ZOSTAVAX  03/18/1996  . TETANUS/TDAP  03/02/2014  . INFLUENZA VACCINE  10/01/2014  . COLONOSCOPY  10/12/2017  . PNA vac Low Risk Adult  Completed   Immunization History  Administered Date(s) Administered  . Influenza, High Dose Seasonal PF 12/18/2013  . Influenza,inj,Quad PF,36+ Mos 03/07/2013  . Pneumococcal Conjugate-13 03/28/2014  . Pneumococcal-Unspecified 03/03/2007  . Td 03/02/2004   Past Surgical History  Procedure Laterality Date  . Carpal tunnel release Left 2007  . Rotator cuff repair Left 2000  . Other surgical history  2007    Negative biopsy tonsil mass   Family History  Problem Relation Age of Onset  . Hypertension Mother   . Stroke Father   . Seizures Sister    History   Social History  . Marital Status: Married    Spouse Name: N/A  . Number of Children: N/A  . Years of Education: N/A   Occupational History  . Not on file.   Social History Main Topics  . Smoking status: Never Smoker   . Smokeless tobacco: Never Used  . Alcohol Use: 7.5 oz/week  15 drink(s) per week  . Drug Use: No  . Sexual Activity: Not on file   Other Topics Concern  . Not on file   Social History Narrative    ROS Constitutional: Denies fever, chills, weight loss/gain, headaches, insomnia,  night sweats or change in appetite. Does c/o fatigue. Eyes: Denies redness, blurred vision, diplopia, discharge, itchy or watery eyes.  ENT: Denies discharge, congestion, post nasal drip, epistaxis, sore throat, earache, hearing loss, dental pain, Tinnitus, Vertigo, Sinus pain or snoring.  Cardio: Denies chest pain, palpitations, irregular heartbeat, syncope, dyspnea, diaphoresis, orthopnea, PND, claudication  or edema Respiratory: denies cough, dyspnea, DOE, pleurisy, hoarseness, laryngitis or wheezing.  Gastrointestinal: Denies dysphagia, heartburn, reflux, water brash, pain, cramps, nausea, vomiting, bloating, diarrhea, constipation, hematemesis, melena, hematochezia, jaundice or hemorrhoids Genitourinary: Denies dysuria, frequency, urgency, nocturia, hesitancy, discharge, hematuria or flank pain Musculoskeletal: Denies arthralgia, myalgia, stiffness, Jt. Swelling, pain, limp or strain/sprain. Denies Falls. Skin: Denies puritis, rash, hives, warts, acne, eczema or change in skin lesion Neuro: No weakness, tremor, incoordination, spasms, paresthesia or pain Psychiatric: Denies confusion, memory loss or sensory loss. Denies Depression. Endocrine: Denies change in weight, skin, hair change, nocturia, and paresthesia, diabetic polys, visual blurring or hyper / hypo glycemic episodes.  Heme/Lymph: No excessive bleeding, bruising or enlarged lymph nodes.  Physical Exam  BP 136/82   Pulse 72  Temp 97.7 F  Resp 16  Ht 5' 11.5"  Wt 179 lb 3.2 oz     BMI 24.65  General Appearance: Well nourished, in no apparent distress. Eyes: PERRLA, EOMs, conjunctiva no swelling or erythema, normal fundi and vessels. Sinuses: No frontal/maxillary tenderness ENT/Mouth: EACs patent / TMs  nl. Nares clear without erythema, swelling, mucoid exudates. Oral hygiene is good. No erythema, swelling, or exudate. Tongue normal, non-obstructing. Tonsils not swollen or erythematous. Hearing normal.  Neck: Supple, thyroid normal. No bruits, nodes or JVD. Respiratory: Respiratory effort normal.  BS equal and clear bilateral without rales, rhonci, wheezing or stridor. Cardio: Heart sounds are normal with regular rate and rhythm and no murmurs, rubs or gallops. Peripheral pulses are normal and equal bilaterally without edema. No aortic or femoral bruits. Chest: symmetric with normal excursions and percussion.  Abdomen: Flat, soft,  with bowel sounds. Nontender, no guarding, rebound, hernias, masses, or organomegaly.  Lymphatics: Non tender without lymphadenopathy.  Genitourinary: No hernias.Testes nl. DRE - prostate nl for age - smooth & firm w/o nodules. Musculoskeletal: Full ROM all peripheral extremities, joint stability, 5/5 strength, and normal gait. Skin: Warm and dry without rashes, lesions, cyanosis, clubbing or  ecchymosis. (=) trigger point at left heel with overlying callus (? Plantar wart).  Neuro: Cranial nerves intact, reflexes equal bilaterally. Normal muscle tone, no cerebellar symptoms. Sensation intact.  Pysch: Awake and oriented X 3 with normal affect, insight and judgment appropriate.   Assessment and Plan  1. Essential hypertension  - TSH - Microalbumin / creatinine urine ratio - EKG 12-Lead - [place], RETROPERITNL ABD,  LTD  2. Hyperlipidemia  - Lipid panel  3. Prediabetes  - Hemoglobin A1c - Insulin, random  4. Vitamin D deficiency  - Vit D  25 hydroxy   5. Idiopathic gout  - Uric acid  6. Gastroesophageal reflux disease   7. Screening for rectal cancer  - POC Hemoccult Bld/Stl   8. Prostate cancer screening  - PSA  9. Depression screen   10. At low risk for fall   11. BMI 25.0-25.9,adult   12. Encounter for long-term (current) use of medications  -  CBC with Differential/Platelet - BASIC METABOLIC PANEL WITH GFR - Hepatic function panel - Magnesium - Urine Microscopic  13. Left Heel Pain   - ? Heel spur vs Plantar Wart  - Podiatry referral    Continue prudent diet as discussed, weight control, BP monitoring, regular exercise, and medications as discussed.  Discussed med effects and SE's. Routine screening labs and tests as requested with regular follow-up as recommended.  Over 40 minutes of exam, counseling &  chart review was performed

## 2014-09-25 LAB — CBC WITH DIFFERENTIAL/PLATELET
BASOS PCT: 1 % (ref 0–1)
Basophils Absolute: 0.1 10*3/uL (ref 0.0–0.1)
EOS PCT: 9 % — AB (ref 0–5)
Eosinophils Absolute: 0.5 10*3/uL (ref 0.0–0.7)
HEMATOCRIT: 44.7 % (ref 39.0–52.0)
Hemoglobin: 15 g/dL (ref 13.0–17.0)
LYMPHS ABS: 2.9 10*3/uL (ref 0.7–4.0)
LYMPHS PCT: 53 % — AB (ref 12–46)
MCH: 30.6 pg (ref 26.0–34.0)
MCHC: 33.6 g/dL (ref 30.0–36.0)
MCV: 91.2 fL (ref 78.0–100.0)
MONO ABS: 0.6 10*3/uL (ref 0.1–1.0)
MONOS PCT: 10 % (ref 3–12)
MPV: 8.9 fL (ref 8.6–12.4)
Neutro Abs: 1.5 10*3/uL — ABNORMAL LOW (ref 1.7–7.7)
Neutrophils Relative %: 27 % — ABNORMAL LOW (ref 43–77)
PLATELETS: 238 10*3/uL (ref 150–400)
RBC: 4.9 MIL/uL (ref 4.22–5.81)
RDW: 14.3 % (ref 11.5–15.5)
WBC: 5.5 10*3/uL (ref 4.0–10.5)

## 2014-09-25 LAB — LIPID PANEL
Cholesterol: 239 mg/dL — ABNORMAL HIGH (ref 125–200)
HDL: 69 mg/dL (ref >=40)
LDL CALC: 143 mg/dL — AB (ref <130)
Total CHOL/HDL Ratio: 3.5 Ratio (ref <=5.0)
Triglycerides: 134 mg/dL (ref <150)
VLDL: 27 mg/dL (ref <30)

## 2014-09-25 LAB — MICROALBUMIN / CREATININE URINE RATIO
Creatinine, Urine: 80.9 mg/dL
MICROALB/CREAT RATIO: 3.7 mg/g (ref 0.0–30.0)
Microalb, Ur: 0.3 mg/dL (ref <2.0)

## 2014-09-25 LAB — URINALYSIS, MICROSCOPIC ONLY
BACTERIA UA: NONE SEEN
CASTS: NONE SEEN
Crystals: NONE SEEN
SQUAMOUS EPITHELIAL / LPF: NONE SEEN

## 2014-09-25 LAB — BASIC METABOLIC PANEL WITH GFR
BUN: 16 mg/dL (ref 7–25)
CALCIUM: 9.6 mg/dL (ref 8.6–10.3)
CHLORIDE: 97 mmol/L — AB (ref 98–110)
CO2: 29 mmol/L (ref 20–31)
Creat: 0.97 mg/dL (ref 0.70–1.18)
GFR, EST AFRICAN AMERICAN: 86 mL/min (ref >=60)
GFR, Est Non African American: 74 mL/min (ref >=60)
Glucose, Bld: 66 mg/dL (ref 65–99)
POTASSIUM: 4 mmol/L (ref 3.5–5.3)
SODIUM: 137 mmol/L (ref 135–146)

## 2014-09-25 LAB — HEPATIC FUNCTION PANEL
ALT: 12 U/L (ref 9–46)
AST: 26 U/L (ref 10–35)
Albumin: 4.3 g/dL (ref 3.6–5.1)
Alkaline Phosphatase: 75 U/L (ref 40–115)
BILIRUBIN INDIRECT: 0.3 mg/dL (ref 0.2–1.2)
BILIRUBIN TOTAL: 0.4 mg/dL (ref 0.2–1.2)
Bilirubin, Direct: 0.1 mg/dL (ref <=0.2)
TOTAL PROTEIN: 7.9 g/dL (ref 6.1–8.1)

## 2014-09-25 LAB — HEMOGLOBIN A1C
HEMOGLOBIN A1C: 6.2 % — AB (ref <5.7)
MEAN PLASMA GLUCOSE: 131 mg/dL — AB (ref <117)

## 2014-09-25 LAB — VITAMIN D 25 HYDROXY (VIT D DEFICIENCY, FRACTURES): Vit D, 25-Hydroxy: 60 ng/mL (ref 30–100)

## 2014-09-25 LAB — INSULIN, RANDOM: Insulin: 2.9 u[IU]/mL (ref 2.0–19.6)

## 2014-09-25 LAB — TSH: TSH: 4.224 u[IU]/mL (ref 0.350–4.500)

## 2014-09-25 LAB — MAGNESIUM: Magnesium: 2.3 mg/dL (ref 1.5–2.5)

## 2014-09-25 LAB — PSA: PSA: 2.56 ng/mL (ref <=4.00)

## 2014-09-25 LAB — URIC ACID: URIC ACID, SERUM: 5 mg/dL (ref 4.0–7.8)

## 2014-10-03 ENCOUNTER — Other Ambulatory Visit: Payer: Self-pay | Admitting: Internal Medicine

## 2014-10-10 ENCOUNTER — Ambulatory Visit (INDEPENDENT_AMBULATORY_CARE_PROVIDER_SITE_OTHER): Payer: Medicare Other | Admitting: Podiatry

## 2014-10-10 ENCOUNTER — Encounter: Payer: Self-pay | Admitting: Podiatry

## 2014-10-10 VITALS — BP 134/85 | HR 59 | Resp 16 | Ht 71.5 in | Wt 183.0 lb

## 2014-10-10 DIAGNOSIS — Q828 Other specified congenital malformations of skin: Secondary | ICD-10-CM | POA: Diagnosis not present

## 2014-10-10 DIAGNOSIS — M722 Plantar fascial fibromatosis: Secondary | ICD-10-CM

## 2014-10-10 MED ORDER — TRIAMCINOLONE ACETONIDE 10 MG/ML IJ SUSP
10.0000 mg | Freq: Once | INTRAMUSCULAR | Status: AC
  Administered 2014-10-10: 10 mg

## 2014-10-10 NOTE — Progress Notes (Signed)
   Subjective:    Patient ID: Jeremy Boone, male    DOB: 20-May-1934, 80 y.o.   MRN: [id number]  HPI Patient presents with bilateral callouses, heel, lateral side. Pt stated, "right foot hurts when drives". This has been going on for the past 2 years .  Review of Systems  HENT: Positive for tinnitus.   Eyes: Positive for visual disturbance.  Genitourinary: Positive for urgency.  Musculoskeletal: Positive for myalgias and back pain.  All other systems reviewed and are negative.      Objective:   Physical Exam        Assessment & Plan:

## 2014-10-10 NOTE — Progress Notes (Signed)
Subjective:     Patient ID: Jeremy Boone, male   DOB: March 22, 1934, 80 y.o.   MRN: [id number]  HPI patient states I've had pain in the bottom of my left heel in the outside of my right heel with lesion formation and states that it has been bothering him for several years   Review of Systems  All other systems reviewed and are negative.      Objective:   Physical Exam  Constitutional: He is oriented to person, place, and time.  Cardiovascular: Intact distal pulses.   Musculoskeletal: Normal range of motion.  Neurological: He is oriented to person, place, and time.  Skin: Skin is warm and dry.  Nursing note and vitals reviewed.  neurovascular status intact muscle strength was found to be within normal limits and range of motion was adequate. Patient's noted to have dry skin and is noted to have moderate depression of the arch with inflammation plantar heel left with keratotic lesion on the outside of the right heel and the plantar lateral aspect of the left heel that are tender when pressed. Good digital perfusion is noted and patient is well oriented 3     Assessment:     Planter fasciitis left with inflammation along with porokeratotic type lesion bilateral    Plan:     H&P and x-rays reviewed and today I injected the plantar fascial left 3 Milligan Kenalog 5 mg Xylocaine and debrided lesions on both feet. Patient tolerated well and will reappoint to recheck

## 2014-10-25 ENCOUNTER — Ambulatory Visit: Payer: Medicare Other

## 2014-10-25 ENCOUNTER — Ambulatory Visit (INDEPENDENT_AMBULATORY_CARE_PROVIDER_SITE_OTHER): Payer: Medicare Other | Admitting: Family Medicine

## 2014-10-25 ENCOUNTER — Ambulatory Visit (INDEPENDENT_AMBULATORY_CARE_PROVIDER_SITE_OTHER): Payer: Medicare Other

## 2014-10-25 VITALS — BP 124/62 | HR 55 | Temp 98.2°F | Resp 18 | Ht 71.5 in | Wt 176.0 lb

## 2014-10-25 DIAGNOSIS — Z23 Encounter for immunization: Secondary | ICD-10-CM

## 2014-10-25 DIAGNOSIS — S60410A Abrasion of right index finger, initial encounter: Secondary | ICD-10-CM | POA: Diagnosis not present

## 2014-10-25 DIAGNOSIS — S60419A Abrasion of unspecified finger, initial encounter: Secondary | ICD-10-CM

## 2014-10-25 DIAGNOSIS — S61219A Laceration without foreign body of unspecified finger without damage to nail, initial encounter: Secondary | ICD-10-CM

## 2014-10-25 MED ORDER — MUPIROCIN 2 % EX OINT: 1.0000 "application " | TOPICAL_OINTMENT | Freq: Two times a day (BID) | CUTANEOUS | Status: DC

## 2014-10-25 NOTE — Patient Instructions (Signed)
Apply ointment twice a day until healed. Keep covered while out and about. Can leave uncovered at home. You can get wet. Return with problems.

## 2014-10-25 NOTE — Progress Notes (Signed)
[facility] [address], [place] [address] [phone]  Date:  10/25/2014   Name:  Jeremy Boone   DOB:  June 14, 1934   MRN:  [id number]  PCP:  Nadean Corwin, MD    Chief Complaint: Laceration   History of Present Illness:  This is a 80 y.o. male with PMH HTN, HLD, prediabetes, gout who is presenting with laceration to right index finger. Occurred 1.5 hours ago while using a table saw. He states "I saw the bone". He cleaned the wound and is wearing a simple bandaid now with no evidence of active bleeding. He has decreased ROM of the finger. Last tdap 2006. He denies paresthesias.  Review of Systems:  Review of Systems See HPI  Patient Active Problem List   Diagnosis Date Noted  . Medicare annual wellness visit, subsequent 09/24/2014  . Vitamin D deficiency 09/14/2013  . Encounter for long-term (current) use of medications 09/14/2013  . History of colonic polyps 08/02/2013  . Esophageal reflux 08/02/2013  . Hyperlipidemia   . Hypertension   . Gout   . Prediabetes     Prior to Admission medications   Medication Sig Start Date End Date Taking? Authorizing Provider  allopurinol (ZYLOPRIM) 300 MG tablet TAKE 1 TABLET BY MOUTH EVERY DAY TO PREVENT GOUT 04/23/14  Yes [name], MD  atenolol (TENORMIN) 100 MG tablet TAKE 1 TABLET (100 MG TOTAL) BY MOUTH DAILY. 10/03/14  Yes Quentin Mulling, PA-C  Cholecalciferol (VITAMIN D PO) Take 5,000 Int'l Units by mouth. Takes every other day   Yes Historical Provider, MD  clonazePAM (KLONOPIN) 2 MG tablet TAKE 1 TABLET BY MOUTH AT BEDTIME AS NEEDED FOR SLEEP 06/27/14 12/27/14 Yes [name], MD  Flaxseed, Linseed, (FLAX SEED OIL PO) Take by mouth 2 (two) times daily.    Yes Historical Provider, MD  hydrochlorothiazide (HYDRODIURIL) 25 MG tablet Take 1 tablet (25 mg total) by mouth daily. 02/27/14  Yes [name], MD  Omega-3 Fatty Acids (FISH OIL PO) Take by mouth 2 (two) times daily.    Yes  Historical Provider, MD  omeprazole (PRILOSEC) 40 MG capsule Take 1 capsule (40 mg total) by mouth daily. For acid indigestion & reflux 02/26/14  Yes [name], MD  sildenafil (REVATIO) 20 MG tablet 1/2 -1 tablet as needed once daily prior to erection 03/28/14  Yes Quentin Mulling, PA-C           Allergies  Allergen Reactions  . Ace Inhibitors Cough  . Lipitor [Atorvastatin]     Elevates CPK and Aldolase    Past Surgical History  Procedure Laterality Date  . Carpal tunnel release Left 2007  . Rotator cuff repair Left 2000  . Other surgical history  2007    Negative biopsy tonsil mass    Social History  Substance Use Topics  . Smoking status: Never Smoker   . Smokeless tobacco: Never Used  . Alcohol Use: 7.5 oz/week    15 drink(s) per week    Family History  Problem Relation Age of Onset  . Hypertension Mother   . Stroke Father   . Seizures Sister     Medication list has been reviewed and updated.  Physical Examination:  Physical Exam  Constitutional: He is oriented to person, place, and time. He appears well-developed and well-nourished. No distress.  HENT:  Head: Normocephalic and atraumatic.  Right Ear: Hearing normal.  Left Ear: Hearing normal.  Nose: Nose normal.  Eyes: Conjunctivae and lids are normal. Right  eye exhibits no discharge. Left eye exhibits no discharge. No scleral icterus.  Cardiovascular: Normal rate, regular rhythm and normal pulses.   Pulmonary/Chest: Effort normal. No respiratory distress.  Neurological: He is alert and oriented to person, place, and time. No sensory deficit.  Skin: Skin is warm and dry.  2 cm skin abrasion on right dorsal index finger just distal to IP joint. No significant bony tenderness except over laceration site. Very small amount bleeding present. Deeper structures are not visible, only cut to level of dermis. Pt unwilling to fully flex finger, full strength intact. Radial pulses 2+ bilaterally. Cap refill intact.   Psychiatric: He has a normal mood and affect. His speech is normal and behavior is normal. Thought content normal.   BP 124/62 mmHg  Pulse 55  Temp(Src) 98.2 F (36.8 C) (Oral)  Resp 18  Ht 5' 11.5" (1.816 m)  Wt 176 lb (79.833 kg)  BMI 24.21 kg/m2  SpO2 97%  [facility] reading (PRIMARY) by  Dr. Conley Rolls: degenerative changes, otherwise negative.  Assessment and Plan:  1. Finger abrasion, initial encounter 2. Need for Tdap  Radiograph negative. Wound did not require repair. Tdap given. Wound dressed with mupirocin applied. Prescribed mupirocin to apply BID until healed. Discussed wound care. Return with further problems. - DG Finger Index Right; Future - mupirocin ointment (BACTROBAN) 2 %; Apply 1 application topically 2 (two) times daily.  Dispense: 22 g; Refill: 1 - Tdap vaccine greater than or equal to 7yo IM   Roswell Miners. Dyke Brackett, MHS [facility]  10/25/2014

## 2014-10-27 NOTE — Progress Notes (Signed)
Agree with A/P. Dr Jewelle Whitner 

## 2014-11-12 ENCOUNTER — Other Ambulatory Visit: Payer: Self-pay | Admitting: Internal Medicine

## 2014-11-29 ENCOUNTER — Other Ambulatory Visit: Payer: Self-pay | Admitting: Internal Medicine

## 2014-12-06 DIAGNOSIS — G4733 Obstructive sleep apnea (adult) (pediatric): Secondary | ICD-10-CM | POA: Diagnosis not present

## 2014-12-06 DIAGNOSIS — G4730 Sleep apnea, unspecified: Secondary | ICD-10-CM | POA: Diagnosis not present

## 2014-12-27 ENCOUNTER — Ambulatory Visit: Payer: Self-pay | Admitting: Internal Medicine

## 2014-12-28 ENCOUNTER — Other Ambulatory Visit: Payer: Self-pay | Admitting: Physician Assistant

## 2015-01-04 ENCOUNTER — Encounter: Payer: Self-pay | Admitting: Internal Medicine

## 2015-01-22 ENCOUNTER — Ambulatory Visit: Payer: Self-pay | Admitting: Internal Medicine

## 2015-02-10 ENCOUNTER — Other Ambulatory Visit: Payer: Self-pay | Admitting: Internal Medicine

## 2015-03-09 ENCOUNTER — Other Ambulatory Visit: Payer: Self-pay | Admitting: Internal Medicine

## 2015-03-30 ENCOUNTER — Other Ambulatory Visit: Payer: Self-pay | Admitting: Internal Medicine

## 2015-03-30 MED ORDER — HYDROCHLOROTHIAZIDE 25 MG PO TABS: ORAL_TABLET | ORAL | Status: DC

## 2015-03-30 MED ORDER — OMEPRAZOLE 40 MG PO CPDR: DELAYED_RELEASE_CAPSULE | ORAL | Status: DC

## 2015-04-01 NOTE — Addendum Note (Signed)
Addended by: Quentin Mulling R on: 04/01/2015 08:36 AM   Modules accepted: Orders

## 2015-04-03 ENCOUNTER — Ambulatory Visit: Payer: Self-pay | Admitting: Internal Medicine

## 2015-04-14 ENCOUNTER — Encounter: Payer: Self-pay | Admitting: Internal Medicine

## 2015-04-18 ENCOUNTER — Encounter: Payer: Self-pay | Admitting: Internal Medicine

## 2015-04-18 ENCOUNTER — Ambulatory Visit (INDEPENDENT_AMBULATORY_CARE_PROVIDER_SITE_OTHER): Payer: Medicare Other | Admitting: Internal Medicine

## 2015-04-18 VITALS — BP 114/80 | HR 56 | Temp 97.3°F | Resp 16 | Ht 71.0 in | Wt 179.4 lb

## 2015-04-18 DIAGNOSIS — M1000 Idiopathic gout, unspecified site: Secondary | ICD-10-CM

## 2015-04-18 DIAGNOSIS — R7303 Prediabetes: Secondary | ICD-10-CM | POA: Diagnosis not present

## 2015-04-18 DIAGNOSIS — E559 Vitamin D deficiency, unspecified: Secondary | ICD-10-CM

## 2015-04-18 DIAGNOSIS — M109 Gout, unspecified: Secondary | ICD-10-CM | POA: Diagnosis not present

## 2015-04-18 DIAGNOSIS — R7309 Other abnormal glucose: Secondary | ICD-10-CM | POA: Diagnosis not present

## 2015-04-18 DIAGNOSIS — I10 Essential (primary) hypertension: Secondary | ICD-10-CM | POA: Diagnosis not present

## 2015-04-18 DIAGNOSIS — Z79899 Other long term (current) drug therapy: Secondary | ICD-10-CM

## 2015-04-18 DIAGNOSIS — E785 Hyperlipidemia, unspecified: Secondary | ICD-10-CM | POA: Diagnosis not present

## 2015-04-18 DIAGNOSIS — K219 Gastro-esophageal reflux disease without esophagitis: Secondary | ICD-10-CM | POA: Diagnosis not present

## 2015-04-18 DIAGNOSIS — G4733 Obstructive sleep apnea (adult) (pediatric): Secondary | ICD-10-CM | POA: Diagnosis not present

## 2015-04-18 DIAGNOSIS — Z9989 Dependence on other enabling machines and devices: Secondary | ICD-10-CM

## 2015-04-18 NOTE — Patient Instructions (Signed)

## 2015-04-19 DIAGNOSIS — G4733 Obstructive sleep apnea (adult) (pediatric): Secondary | ICD-10-CM | POA: Insufficient documentation

## 2015-04-19 DIAGNOSIS — Z9989 Dependence on other enabling machines and devices: Secondary | ICD-10-CM

## 2015-04-19 LAB — BASIC METABOLIC PANEL WITH GFR
BUN: 18 mg/dL (ref 7–25)
CHLORIDE: 94 mmol/L — AB (ref 98–110)
CO2: 27 mmol/L (ref 20–31)
Calcium: 9.4 mg/dL (ref 8.6–10.3)
Creat: 0.96 mg/dL (ref 0.70–1.18)
GFR, EST AFRICAN AMERICAN: 87 mL/min (ref >=60)
GFR, EST NON AFRICAN AMERICAN: 75 mL/min (ref >=60)
Glucose, Bld: 74 mg/dL (ref 65–99)
POTASSIUM: 3.8 mmol/L (ref 3.5–5.3)
SODIUM: 134 mmol/L — AB (ref 135–146)

## 2015-04-19 LAB — HEPATIC FUNCTION PANEL
ALK PHOS: 82 U/L (ref 40–115)
ALT: 15 U/L (ref 9–46)
AST: 32 U/L (ref 10–35)
Albumin: 4.2 g/dL (ref 3.6–5.1)
BILIRUBIN DIRECT: 0.1 mg/dL (ref <=0.2)
BILIRUBIN INDIRECT: 0.3 mg/dL (ref 0.2–1.2)
BILIRUBIN TOTAL: 0.4 mg/dL (ref 0.2–1.2)
TOTAL PROTEIN: 7.5 g/dL (ref 6.1–8.1)

## 2015-04-19 LAB — LIPID PANEL
Cholesterol: 225 mg/dL — ABNORMAL HIGH (ref 125–200)
HDL: 73 mg/dL (ref >=40)
LDL CALC: 133 mg/dL — AB (ref <130)
TRIGLYCERIDES: 96 mg/dL (ref <150)
Total CHOL/HDL Ratio: 3.1 Ratio (ref <=5.0)
VLDL: 19 mg/dL (ref <30)

## 2015-04-19 LAB — CBC WITH DIFFERENTIAL/PLATELET
BASOS ABS: 0.1 10*3/uL (ref 0.0–0.1)
BASOS PCT: 1 % (ref 0–1)
EOS ABS: 0.8 10*3/uL — AB (ref 0.0–0.7)
Eosinophils Relative: 16 % — ABNORMAL HIGH (ref 0–5)
HCT: 43.8 % (ref 39.0–52.0)
Hemoglobin: 14.6 g/dL (ref 13.0–17.0)
LYMPHS ABS: 2.6 10*3/uL (ref 0.7–4.0)
Lymphocytes Relative: 49 % — ABNORMAL HIGH (ref 12–46)
MCH: 30 pg (ref 26.0–34.0)
MCHC: 33.3 g/dL (ref 30.0–36.0)
MCV: 90.1 fL (ref 78.0–100.0)
MPV: 9.3 fL (ref 8.6–12.4)
Monocytes Absolute: 0.5 10*3/uL (ref 0.1–1.0)
Monocytes Relative: 10 % (ref 3–12)
NEUTROS PCT: 24 % — AB (ref 43–77)
Neutro Abs: 1.3 10*3/uL — ABNORMAL LOW (ref 1.7–7.7)
PLATELETS: 254 10*3/uL (ref 150–400)
RBC: 4.86 MIL/uL (ref 4.22–5.81)
RDW: 14 % (ref 11.5–15.5)
WBC: 5.3 10*3/uL (ref 4.0–10.5)

## 2015-04-19 LAB — MAGNESIUM: Magnesium: 1.9 mg/dL (ref 1.5–2.5)

## 2015-04-19 LAB — VITAMIN D 25 HYDROXY (VIT D DEFICIENCY, FRACTURES): Vit D, 25-Hydroxy: 60 ng/mL (ref 30–100)

## 2015-04-19 LAB — TSH: TSH: 4.26 mIU/L (ref 0.40–4.50)

## 2015-04-19 LAB — HEMOGLOBIN A1C
Hgb A1c MFr Bld: 6.2 % — ABNORMAL HIGH (ref <5.7)
MEAN PLASMA GLUCOSE: 131 mg/dL — AB (ref <117)

## 2015-04-19 LAB — URIC ACID: Uric Acid, Serum: 4.6 mg/dL (ref 4.0–7.8)

## 2015-04-19 LAB — INSULIN, RANDOM: Insulin: 4.1 u[IU]/mL (ref 2.0–19.6)

## 2015-04-19 NOTE — Progress Notes (Signed)
Patient ID: Jeremy Boone, male   DOB: 1934/05/06, 81 y.o.   MRN: [id number]   This very nice 81 y.o. MBM from [place] who presents for follow up with Hypertension, Hyperlipidemia, Pre-Diabetes and Vitamin D Deficiency. Patient also has OSA on CPAP.   Patient is treated for HTN since 1995 & BP has been controlled at home. Today's BP: 114/80 mmHg. Patient has had no complaints of any cardiac type chest pain, palpitations, dyspnea/orthopnea/PND, dizziness, claudication, or dependent edema.   Hyperlipidemia is not controlled with diet & meds (Statin intolerant) . Patient denies myalgias or other med SE's. Current Lipids are not at goal with Cholesterol 225*; HDL 73; LDL 133*; and Triglycerides 96.    Also, the patient has history of PreDiabetes with A1c 6.3% in 2011 and he has had no symptoms of reactive hypoglycemia, diabetic polys, paresthesias or visual blurring.  Current A1c is still not at goal with 6.2%.   Further, the patient also has history of Vitamin D Deficiency of "25" in 2008 and supplements vitamin D without any suspected side-effects.Current vitamin D is  60.   Medication Sig  . allopurinol  300 MG  TAKE 1 TABLET BY MOUTH EVERY DAY TO PREVENT GOUT  . TENORMIN 100 MG TAKE 1 TABLET BY MOUTH DAILY  . VITAMIN D  Take 5,000 Int'l Units by mouth. Takes every other day  . gemfibrozil600 MG  TAKE 1 TABLET BY MOUTH TWICE A DAY  . hctz 25 MG tablet Take 1 tablet daily for BP & swelling  . omeprazole  40 MG  Take 1 capsule daily for acid indigestion & acid reflux  . sildenafil  20 MG  1/2 -1 tablet as needed once daily prior to erection  . KLONOPIN 2 MG  TAKE 1 TABLET BY MOUTH AT BEDTIME AS NEEDED FOR SLEEP  . FLAX SEED OIL Take by mouth 2 (two) times daily.   . mupirocin ointment2 % Apply 1 application topically 2 (two) times daily.  . Omega-3 FISH OIL  Take by mouth 2 (two) times daily.    Allergies  Allergen Reactions  . Ace Inhibitors Cough  . Lipitor [Atorvastatin]     Elevates  CPK and Aldolase   PMHx:   Past Medical History  Diagnosis Date  . Hyperlipidemia   . Hypertension   . Gout   . Prediabetes    Immunization History  Administered Date(s) Administered  . Influenza, High Dose Seasonal PF 12/18/2013  . Influenza,inj,Quad PF,36+ Mos 03/07/2013  . Pneumococcal Conjugate-13 03/28/2014  . Pneumococcal-Unspecified 03/03/2007  . Td 03/02/2004  . Tdap 10/25/2014   Past Surgical History  Procedure Laterality Date  . Carpal tunnel release Left 2007  . Rotator cuff repair Left 2000  . Other surgical history  2007    Negative biopsy tonsil mass   FHx:    Reviewed / unchanged  SHx:    Reviewed / unchanged  Systems Review:  Constitutional: Denies fever, chills, wt changes, headaches, insomnia, fatigue, night sweats, change in appetite. Eyes: Denies redness, blurred vision, diplopia, discharge, itchy, watery eyes.  ENT: Denies discharge, congestion, post nasal drip, epistaxis, sore throat, earache, hearing loss, dental pain, tinnitus, vertigo, sinus pain, snoring.  CV: Denies chest pain, palpitations, irregular heartbeat, syncope, dyspnea, diaphoresis, orthopnea, PND, claudication or edema. Respiratory: denies cough, dyspnea, DOE, pleurisy, hoarseness, laryngitis, wheezing.  Gastrointestinal: Denies dysphagia, odynophagia, heartburn, reflux, water brash, abdominal pain or cramps, nausea, vomiting, bloating, diarrhea, constipation, hematemesis, melena, hematochezia  or hemorrhoids. Genitourinary: Denies dysuria, frequency, urgency, nocturia,  hesitancy, discharge, hematuria or flank pain. Musculoskeletal: Denies arthralgias, myalgias, stiffness, jt. swelling, pain, limping or strain/sprain.  Skin: Denies pruritus, rash, hives, warts, acne, eczema or change in skin lesion(s). Neuro: No weakness, tremor, incoordination, spasms, paresthesia or pain. Psychiatric: Denies confusion, memory loss or sensory loss. Endo: Denies change in weight, skin or hair change.   Heme/Lymph: No excessive bleeding, bruising or enlarged lymph nodes.  Physical Exam  BP 114/80 mmHg  Pulse 56  Temp(Src) 97.3 F (36.3 C)  Resp 16  Ht  (1.803 m)  Wt 179 lb 6.4 oz (81.375 kg)  BMI 25.03 kg/m2  Appears well nourished and in no distress. Eyes: PERRLA, EOMs, conjunctiva no swelling or erythema. Sinuses: No frontal/maxillary tenderness ENT/Mouth: EAC's clear, TM's nl w/o erythema, bulging. Nares clear w/o erythema, swelling, exudates. Oropharynx clear without erythema or exudates. Oral hygiene is good. Tongue normal, non obstructing. Hearing intact.  Neck: Supple. Thyroid nl. Car 2+/2+ without bruits, nodes or JVD. Chest: Respirations nl with BS clear & equal w/o rales, rhonchi, wheezing or stridor.  Cor: Heart sounds normal w/ regular rate and rhythm without sig. murmurs, gallops, clicks, or rubs. Peripheral pulses normal and equal  without edema.  Abdomen: Soft & bowel sounds normal. Non-tender w/o guarding, rebound, hernias, masses, or organomegaly.  Lymphatics: Unremarkable.  Musculoskeletal: Full ROM all peripheral extremities, joint stability, 5/5 strength, and normal gait.  Skin: Warm, dry without exposed rashes, lesions or ecchymosis apparent.  Neuro: Cranial nerves intact, reflexes equal bilaterally. Sensory-motor testing grossly intact. Tendon reflexes grossly intact.  Pysch: Alert & oriented x 3.  Insight and judgement nl & appropriate. No ideations.  Assessment and Plan:  1. Essential hypertension  - TSH  2. Hyperlipidemia  - Lipid panel - TSH  3. Prediabetes  - Hemoglobin A1c - Insulin, random  4. Vitamin D deficiency  - VITAMIN D 25 Hydroxy   5. Idiopathic gout  - Uric acid  6. Gastroesophageal reflux disease   7. Medication management  - CBC with Differential/Platelet - BASIC METABOLIC PANEL WITH GFR - Hepatic function panel - Magnesium   Recommended regular exercise, BP monitoring, weight control, and discussed med and  SE's. Recommended labs to assess and monitor clinical status. Further disposition pending results of labs. Over 30 minutes of exam, counseling, chart review was performed

## 2015-05-10 ENCOUNTER — Encounter: Payer: Self-pay | Admitting: Internal Medicine

## 2015-05-23 DIAGNOSIS — Z79899 Other long term (current) drug therapy: Secondary | ICD-10-CM | POA: Diagnosis not present

## 2015-05-25 ENCOUNTER — Other Ambulatory Visit: Payer: Self-pay | Admitting: Internal Medicine

## 2015-06-18 ENCOUNTER — Other Ambulatory Visit: Payer: Self-pay | Admitting: *Deleted

## 2015-06-18 MED ORDER — HYDROCHLOROTHIAZIDE 25 MG PO TABS: ORAL_TABLET | ORAL | Status: DC

## 2015-06-18 MED ORDER — ALLOPURINOL 300 MG PO TABS: ORAL_TABLET | ORAL | Status: DC

## 2015-06-18 MED ORDER — GEMFIBROZIL 600 MG PO TABS: 600.0000 mg | ORAL_TABLET | Freq: Two times a day (BID) | ORAL | Status: DC

## 2015-06-18 MED ORDER — ATENOLOL 100 MG PO TABS: 100.0000 mg | ORAL_TABLET | Freq: Every day | ORAL | Status: DC

## 2015-06-18 MED ORDER — OMEPRAZOLE 40 MG PO CPDR: DELAYED_RELEASE_CAPSULE | ORAL | Status: DC

## 2015-06-18 MED ORDER — CLONAZEPAM 2 MG PO TABS: 2.0000 mg | ORAL_TABLET | Freq: Every evening | ORAL | Status: DC | PRN

## 2015-07-22 ENCOUNTER — Ambulatory Visit (INDEPENDENT_AMBULATORY_CARE_PROVIDER_SITE_OTHER): Payer: Medicare Other | Admitting: Physician Assistant

## 2015-07-22 ENCOUNTER — Encounter: Payer: Self-pay | Admitting: Physician Assistant

## 2015-07-22 VITALS — BP 120/76 | HR 61 | Temp 97.7°F | Resp 16 | Ht 71.0 in | Wt 176.2 lb

## 2015-07-22 DIAGNOSIS — I10 Essential (primary) hypertension: Secondary | ICD-10-CM

## 2015-07-22 DIAGNOSIS — R6889 Other general symptoms and signs: Secondary | ICD-10-CM | POA: Diagnosis not present

## 2015-07-22 DIAGNOSIS — Z9989 Dependence on other enabling machines and devices: Secondary | ICD-10-CM

## 2015-07-22 DIAGNOSIS — Z86010 Personal history of colonic polyps: Secondary | ICD-10-CM | POA: Diagnosis not present

## 2015-07-22 DIAGNOSIS — Z0001 Encounter for general adult medical examination with abnormal findings: Secondary | ICD-10-CM | POA: Diagnosis not present

## 2015-07-22 DIAGNOSIS — E785 Hyperlipidemia, unspecified: Secondary | ICD-10-CM

## 2015-07-22 DIAGNOSIS — M1000 Idiopathic gout, unspecified site: Secondary | ICD-10-CM | POA: Diagnosis not present

## 2015-07-22 DIAGNOSIS — E559 Vitamin D deficiency, unspecified: Secondary | ICD-10-CM

## 2015-07-22 DIAGNOSIS — R7303 Prediabetes: Secondary | ICD-10-CM

## 2015-07-22 DIAGNOSIS — R7309 Other abnormal glucose: Secondary | ICD-10-CM | POA: Diagnosis not present

## 2015-07-22 DIAGNOSIS — Z136 Encounter for screening for cardiovascular disorders: Secondary | ICD-10-CM

## 2015-07-22 DIAGNOSIS — R0600 Dyspnea, unspecified: Secondary | ICD-10-CM

## 2015-07-22 DIAGNOSIS — M545 Low back pain, unspecified: Secondary | ICD-10-CM

## 2015-07-22 DIAGNOSIS — Z79899 Other long term (current) drug therapy: Secondary | ICD-10-CM

## 2015-07-22 DIAGNOSIS — K219 Gastro-esophageal reflux disease without esophagitis: Secondary | ICD-10-CM | POA: Diagnosis not present

## 2015-07-22 DIAGNOSIS — G4733 Obstructive sleep apnea (adult) (pediatric): Secondary | ICD-10-CM | POA: Diagnosis not present

## 2015-07-22 DIAGNOSIS — Z0000 Encounter for general adult medical examination without abnormal findings: Secondary | ICD-10-CM

## 2015-07-22 MED ORDER — MELOXICAM 15 MG PO TABS: ORAL_TABLET | ORAL | Status: DC

## 2015-07-22 NOTE — Progress Notes (Signed)
MEDICARE ANNUAL WELLNESS VISIT AND FOLLOW UP Assessment:   1. Essential hypertension - continue medications, DASH diet, exercise and monitor at home. Call if greater than 130/80.  - CBC with Differential/Platelet - BASIC METABOLIC PANEL WITH GFR - Hepatic function panel - TSH  2. Dyspnea Check EKG, CXR, troponin/CK, check labs to rule out anemia, may be GERD related as well, will continue prilosec, if worsening CP, SOB, N/V, dizziness, etc will go to ER  3. Prediabetes Discussed general issues about diabetes pathophysiology and management., Educational material distributed., Suggested low cholesterol diet., Encouraged aerobic exercise., Discussed foot care., Reminded to get yearly retinal exam. - Hemoglobin A1c - HM DIABETES FOOT EXAM  4. Hyperlipidemia -continue medications, check lipids, decrease fatty foods, increase activity.  - Lipid panel  5. 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  7. History of colonic polyps Due 2019  8. Vitamin D deficiency - Vit D  25 hydroxy (rtn osteoporosis monitoring)  9. Encounter for long-term (current) use of medications - Magnesium  10. Back pain Mobic to take PRN, exercises given, follow up with ortho    Plan:   During the course of the visit the patient was educated and counseled about appropriate screening and preventive services including:    Pneumococcal vaccine   Influenza vaccine  Td vaccine  Screening electrocardiogram  Colorectal cancer screening  Diabetes screening  Glaucoma screening  Nutrition counseling    Conditions/risks identified: BMI: Discussed weight loss, diet, and increase physical activity.  Increase physical activity: AHA recommends 150 minutes of physical activity  a week.  Medications reviewed Diabetes is at goal, ACE/ARB therapy: No, Reason not on Ace Inhibitor/ARB therapy:  PreDM only Urinary Incontinence is not an issue: discussed non pharmacology and pharmacology options.  Fall risk: low- discussed PT, home fall assessment, medications.    Subjective:  Jeremy Boone is a 81 y.o. male who presents for Medicare Annual Wellness Visit and 3 month follow up for HTN, hyperlipidemia, prediabetes, and vitamin D Def.  Date of last medicare wellness visit was 2016  His blood pressure has been controlled at home, today their BP is BP: 120/76 mmHg He does workout. He denies chest pain, , dizziness. He has some shortness of breath with exertion x 2 months which is new, no CP, dizziness, nausea.  He is on cholesterol medication, lopid 600 once daily due to intolerance of statins and denies myalgias. His cholesterol is not at goal. The cholesterol last visit was:   Lab Results  Component Value Date   CHOL 225* 04/18/2015   HDL 73 04/18/2015   LDLCALC 133* 04/18/2015   TRIG 96 04/18/2015   CHOLHDL 3.1 04/18/2015  He has been working on diet and exercise for prediabetes, and denies paresthesia of the feet, polydipsia, polyuria and visual disturbances. Last A1C in the office was:  Lab Results  Component Value Date   HGBA1C 6.2* 04/18/2015  Patient is on Vitamin D supplement.   Lab Results  Component Value Date   VD25OH 60 04/18/2015   He has ED and uses viagra PRN. Patient is on allopurinol for gout and does not report a recent flare.  He is on klonopin as needed for insomnia.  He does not smoke but admits to drinking alcohol on a regular basis, he had dysphagia last year, saw Dr. Arlyce Dice had EG esophagus  and is now on prilosec for GERD which helps.  He will have intermittent right lower back pain, worse in the morning after he has been in bed for a while, will have occ pain down legs but does not pay attention to him. He has not tried anything for the  pain. He has some hesitancy and dribbling with urination but denies burning/blood/fever, chills.   Names of Other Physician/Practitioners you currently use: 1. [facility] here for primary care Patient Care Team: Lucky CowboyWilliam McKeown, MD as PCP - General (Internal Medicine) Lamar Benesharles R Epes, MD as Consulting Physician (Ophthalmology)-  2015 last visit Drucilla SchmidtJames P Aplington, MD as Consulting Physician (Orthopedic Surgery) Louis Meckelobert D Kaplan, MD as Consulting Physician (Gastroenterology)  Medication Review: Current Outpatient Prescriptions on File Prior to Visit  Medication Sig Dispense Refill  . allopurinol (ZYLOPRIM) 300 MG tablet TAKE 1 TABLET BY MOUTH EVERY DAY TO PREVENT GOUT 90 tablet 1  . atenolol (TENORMIN) 100 MG tablet Take 1 tablet (100 mg total) by mouth daily. 90 tablet 1  . Cholecalciferol (VITAMIN D PO) Take 5,000 Int'l Units by mouth. Takes every other day    . clonazePAM (KLONOPIN) 2 MG tablet Take 1 tablet (2 mg total) by mouth at bedtime as needed. for sleep 90 tablet 1  . gemfibrozil (LOPID) 600 MG tablet Take 1 tablet (600 mg total) by mouth 2 (two) times daily. 180 tablet 1  . hydrochlorothiazide (HYDRODIURIL) 25 MG tablet TAKE 1 TABLET (25 MG TOTAL) BY MOUTH DAILY. 90 tablet 1  . omeprazole (PRILOSEC) 40 MG capsule Take 1 capsule daily for acid indigestion & acid reflux 90 capsule 1  . sildenafil (REVATIO) 20 MG tablet 1/2 -1 tablet as needed once daily prior to erection 10 tablet 1   No current facility-administered medications on file prior to visit.    Current Problems (verified) Patient Active Problem List   Diagnosis Date Noted  . OSA on CPAP 04/19/2015  . Medicare annual wellness visit, subsequent 09/24/2014  . Vitamin D deficiency 09/14/2013  . Medication management 09/14/2013  . History of colonic polyps 08/02/2013  . GERD  08/02/2013  . Hyperlipidemia   . Hypertension   . Gout   . Prediabetes     Screening  Tests Immunization History  Administered Date(s) Administered  . Influenza, High Dose Seasonal PF 12/18/2013  . Influenza,inj,Quad PF,36+ Mos 03/07/2013  . Pneumococcal Conjugate-13 03/28/2014  . Pneumococcal-Unspecified 03/03/2007  . Td 03/02/2004  . Tdap 10/25/2014    Preventative care: Last colonoscopy: 10/13/07 DUE 2019, polyps, diverticulosis EGD: 2015 Dr. Arlyce DiceKaplan CXR: 2007 CT head 2010 CT lumbar 2009  Prior vaccinations: TD or Tdap: 2016 Influenza: 2015 Pneumococcal: 2009 Prevnar 13: 2016 Shingles/Zostavax: Declines due to cost   Past Surgical History  Procedure Laterality Date  . Carpal tunnel release Left 2007  . Rotator cuff repair Left 2000  . Other surgical history  2007    Negative biopsy tonsil mass   Family History  Problem Relation Age of Onset  . Hypertension Mother   . Stroke Father   . Seizures Sister     History reviewed: allergies, current medications, past family history, past medical history, past social history, past surgical history and problem list   Risk Factors: Tobacco Social History  Substance Use Topics  . Smoking status: Never Smoker   . Smokeless tobacco: Never Used  . Alcohol Use: 7.5 oz/week    15 drink(s) per week   He does not smoke.  Patient is  not a former smoker. Are there smokers in your home (other than you)?  No  Alcohol Current alcohol use: 2 liquor drinks per day(s)  Caffeine Current caffeine use: coffee 2 /day  Exercise Current exercise: housecleaning, walking and yard work  Nutrition/Diet Current diet: in general, a "healthy" diet    Cardiac risk factors: advanced age (older than 37 for men, 35 for women), dyslipidemia, family history of premature cardiovascular disease, hypertension, male gender and sedentary lifestyle.  Depression Screen (Note: if answer to either of the following is "Yes", a more complete depression screening is indicated)   Q1: Over the past two weeks, have you felt down, depressed  or hopeless? No  Q2: Over the past two weeks, have you felt little interest or pleasure in doing things? No  Have you lost interest or pleasure in daily life? No  Do you often feel hopeless? No  Do you cry easily over simple problems? No  Activities of Daily Living In your present state of health, do you have any difficulty performing the following activities?:  Driving? No Managing money?  No Feeding yourself? No Getting from bed to chair? No Climbing a flight of stairs? No Preparing food and eating?: No Bathing or showering? No Getting dressed: No Getting to the toilet? No Using the toilet:No Moving around from place to place: No In the past year have you fallen or had a near fall?:No  Vision Difficulties: No  Hearing Difficulties: No Do you often ask people to speak up or repeat themselves? No Do you experience ringing or noises in your ears? Yes Do you have difficulty understanding soft or whispered voices? No  Cognition  Do you feel that you have a problem with memory?No  Do you often misplace items? No  Do you feel safe at home?  Yes  Advanced directives Does patient have a Health Care Power of Attorney? Yes Does patient have a Living Will? Yes   Objective:   Blood pressure 120/76, pulse 61, temperature 97.7 F (36.5 C), temperature source Temporal, resp. rate 16, height 5\' 11"  (1.803 m), weight 176 lb 3.2 oz (79.924 kg), SpO2 98 %. Body mass index is 24.59 kg/(m^2).  General appearance: alert, no distress, WD/WN, male Cognitive Testing  Alert? Yes  Normal Appearance?Yes  Oriented to person? Yes  Place? Yes   Time? Yes  Recall of three objects?  Yes  Can perform simple calculations? Yes  Displays appropriate judgment?Yes  Can read the correct time from a watch face?Yes  HEENT: normocephalic, sclerae anicteric, TMs pearly, nares patent, no discharge or erythema, pharynx normal Oral cavity: MMM, no lesions Neck: supple, no lymphadenopathy, no thyromegaly,  no masses Heart: RRR, normal S1, S2, no murmurs Lungs: CTA bilaterally, no wheezes, rhonchi, or rales Abdomen: +bs, soft, non tender, non distended, no masses, no hepatomegaly, no splenomegaly Musculoskeletal: nontender, no swelling, no obvious deformity Extremities: no edema, no cyanosis, no clubbing Pulses: 2+ symmetric, upper and lower extremities, normal cap refill Neurological: alert, oriented x 3, CN2-12 intact, strength normal upper extremities and lower extremities, sensation normal throughout, DTRs 2+ throughout, no cerebellar signs, gait normal Psychiatric: normal affect, behavior normal, pleasant   Medicare Attestation I have personally reviewed: The patient's medical and social history Their use of alcohol, tobacco or illicit drugs Their current medications and supplements The patient's functional ability including ADLs,fall risks, home safety risks, cognitive, and hearing and visual impairment Diet and physical activities Evidence for depression or mood disorders  The patient's weight, height, BMI,  and visual acuity have been recorded in the chart.  I have made referrals, counseling, and provided education to the patient based on review of the above and I have provided the patient with a written personalized care plan for preventive services.     Quentin Mulling, PA-C   07/22/2015

## 2015-07-23 ENCOUNTER — Encounter: Payer: Self-pay | Admitting: Physician Assistant

## 2015-07-23 LAB — BASIC METABOLIC PANEL WITH GFR
BUN: 16 mg/dL (ref 7–25)
CALCIUM: 9.1 mg/dL (ref 8.6–10.3)
CO2: 29 mmol/L (ref 20–31)
Chloride: 100 mmol/L (ref 98–110)
Creat: 0.95 mg/dL (ref 0.70–1.18)
GFR, EST AFRICAN AMERICAN: 88 mL/min (ref >=60)
GFR, EST NON AFRICAN AMERICAN: 76 mL/min (ref >=60)
GLUCOSE: 78 mg/dL (ref 65–99)
POTASSIUM: 4 mmol/L (ref 3.5–5.3)
SODIUM: 139 mmol/L (ref 135–146)

## 2015-07-23 LAB — HEPATIC FUNCTION PANEL
ALK PHOS: 78 U/L (ref 40–115)
ALT: 10 U/L (ref 9–46)
AST: 24 U/L (ref 10–35)
Albumin: 4.1 g/dL (ref 3.6–5.1)
BILIRUBIN DIRECT: 0.1 mg/dL (ref <=0.2)
BILIRUBIN INDIRECT: 0.2 mg/dL (ref 0.2–1.2)
BILIRUBIN TOTAL: 0.3 mg/dL (ref 0.2–1.2)
Total Protein: 7.5 g/dL (ref 6.1–8.1)

## 2015-07-23 LAB — CBC WITH DIFFERENTIAL/PLATELET
BASOS PCT: 1 %
Basophils Absolute: 58 cells/uL (ref 0–200)
EOS PCT: 11 %
Eosinophils Absolute: 638 cells/uL — ABNORMAL HIGH (ref 15–500)
HEMATOCRIT: 43.1 % (ref 38.5–50.0)
Hemoglobin: 14.5 g/dL (ref 13.2–17.1)
LYMPHS ABS: 2668 {cells}/uL (ref 850–3900)
LYMPHS PCT: 46 %
MCH: 30.7 pg (ref 27.0–33.0)
MCHC: 33.6 g/dL (ref 32.0–36.0)
MCV: 91.1 fL (ref 80.0–100.0)
MONO ABS: 522 {cells}/uL (ref 200–950)
MPV: 9.2 fL (ref 7.5–12.5)
Monocytes Relative: 9 %
NEUTROS PCT: 33 %
Neutro Abs: 1914 cells/uL (ref 1500–7800)
Platelets: 241 10*3/uL (ref 140–400)
RBC: 4.73 MIL/uL (ref 4.20–5.80)
RDW: 13.8 % (ref 11.0–15.0)
WBC: 5.8 10*3/uL (ref 3.8–10.8)

## 2015-07-23 LAB — TSH: TSH: 4.4 mIU/L (ref 0.40–4.50)

## 2015-07-23 LAB — LIPID PANEL
CHOLESTEROL: 217 mg/dL — AB (ref 125–200)
HDL: 71 mg/dL (ref >=40)
LDL CALC: 130 mg/dL — AB (ref <130)
TRIGLYCERIDES: 78 mg/dL (ref <150)
Total CHOL/HDL Ratio: 3.1 Ratio (ref <=5.0)
VLDL: 16 mg/dL (ref <30)

## 2015-07-23 LAB — VITAMIN D 25 HYDROXY (VIT D DEFICIENCY, FRACTURES): Vit D, 25-Hydroxy: 71 ng/mL (ref 30–100)

## 2015-07-23 LAB — CK TOTAL AND CKMB (NOT AT ARMC)
CK, MB: 8.5 ng/mL — ABNORMAL HIGH (ref 0.0–5.0)
Relative Index: 2.5 (ref 0.0–4.0)
Total CK: 346 U/L — ABNORMAL HIGH (ref 7–232)

## 2015-07-23 LAB — HEMOGLOBIN A1C
Hgb A1c MFr Bld: 6.3 % — ABNORMAL HIGH (ref <5.7)
MEAN PLASMA GLUCOSE: 134 mg/dL

## 2015-07-23 LAB — MAGNESIUM: Magnesium: 2 mg/dL (ref 1.5–2.5)

## 2015-07-23 LAB — TROPONIN I: Troponin I: <0.01 ng/mL (ref <=0.05)

## 2015-07-23 NOTE — Addendum Note (Signed)
Addended by: Quentin MullingOLLIER, Gustavia Carie R on: 07/23/2015 12:15 PM   Modules accepted: Orders

## 2015-07-30 ENCOUNTER — Encounter: Payer: Self-pay | Admitting: Cardiology

## 2015-07-30 ENCOUNTER — Encounter (INDEPENDENT_AMBULATORY_CARE_PROVIDER_SITE_OTHER): Payer: Medicare Other

## 2015-07-30 ENCOUNTER — Ambulatory Visit (INDEPENDENT_AMBULATORY_CARE_PROVIDER_SITE_OTHER): Payer: Medicare Other | Admitting: Cardiology

## 2015-07-30 ENCOUNTER — Ambulatory Visit: Payer: Self-pay | Admitting: Physician Assistant

## 2015-07-30 VITALS — BP 115/76 | HR 66 | Ht 72.0 in | Wt 179.0 lb

## 2015-07-30 DIAGNOSIS — I499 Cardiac arrhythmia, unspecified: Secondary | ICD-10-CM | POA: Diagnosis not present

## 2015-07-30 DIAGNOSIS — I10 Essential (primary) hypertension: Secondary | ICD-10-CM | POA: Diagnosis not present

## 2015-07-30 DIAGNOSIS — G4733 Obstructive sleep apnea (adult) (pediatric): Secondary | ICD-10-CM | POA: Diagnosis not present

## 2015-07-30 DIAGNOSIS — R0600 Dyspnea, unspecified: Secondary | ICD-10-CM | POA: Diagnosis not present

## 2015-07-30 DIAGNOSIS — R002 Palpitations: Secondary | ICD-10-CM | POA: Diagnosis not present

## 2015-07-30 NOTE — Progress Notes (Signed)
PCP: Alesia Richards, MD  Clinic Note: Chief Complaint  Patient presents with  . New Patient (Initial Visit)    SOB; randomly. Pt states no other Sx.    HPI: Jeremy Boone is a 81 y.o. male with a PMH below who presents today for Cardiology Consultation For episodes of funny heart beats with shortness of breath.  He is a native of [place].  Jeremy Boone was seen by his PCP PA on May 22 and noted having episodes of irregular heartbeats and dyspnea. After initial evaluation, the patient is now referred for cardiology evaluation  Recent Hospitalizations: None  Studies Reviewed: None. Lab reviewed below.  Interval History: Patient is a very pleasant gentleman who has a history of hypertension and hyperlipidemia as well as "prediabetes ", but no cardiac history to speak of. He is not fully sure that of why he is here, but suggested something about a stress test. He thought that this meant that he was stressed. Basically his major complaint is having relatively frequent episodes of feeling that his heart just is beating funny, maybe a little bit fast and forceful. He can just feel it, versus infiltrate in the morning this morning. It lasted about 15 minutes. It doesn't happen every single day, but may happen a couple times per week. It is associated with a little bit of shortness of breath and dizziness, but really not any syncope or near syncope type symptoms. Other than the dyspnea associated with it, he denies any chest tightness or pressure. There is no necessary association with any particular activity, in fact it usually happens when he wakes up in the morning. He may notice it if he gets out of bed at night to go the bathroom and then comes back. Nothing really has made it any better or worse. He recollects distant history of maybe one near syncopal episode about a year or 2 ago that was maybe more associated with him being sick and not eating well. He has not had any TIA or  murmurs fugax symptoms. No sig. Near syncopal type symptoms. No heart phase and are PND, orthopnea or edema.  No claudication.  ROS: A comprehensive was performed. Review of Systems  Constitutional: Negative for malaise/fatigue.  HENT: Negative for congestion and nosebleeds.   Respiratory: Positive for shortness of breath.   Cardiovascular: Positive for palpitations. Negative for claudication.  Gastrointestinal: Positive for heartburn. Negative for blood in stool and melena.  Genitourinary: Negative for hematuria.  Musculoskeletal: Negative.  Negative for joint pain.  Neurological: Negative for headaches.  Endo/Heme/Allergies: Does not bruise/bleed easily.  Psychiatric/Behavioral: The patient is nervous/anxious (When he has these spells of irregular heartbeat).     Past Medical History  Diagnosis Date  . Hyperlipidemia   . Hypertension   . Gout   . Prediabetes     Past Surgical History  Procedure Laterality Date  . Carpal tunnel release Left 2007  . Rotator cuff repair Left 2000  . Other surgical history  2007    Negative biopsy tonsil mass    Prior to Admission medications   Medication Sig Start Date End Date Taking? Authorizing Provider  allopurinol (ZYLOPRIM) 300 MG tablet TAKE 1 TABLET BY MOUTH EVERY DAY TO PREVENT GOUT 06/18/15  Yes Unk Pinto, MD  atenolol (TENORMIN) 100 MG tablet Take 1 tablet (100 mg total) by mouth daily. 06/18/15  Yes Unk Pinto, MD  Cholecalciferol (VITAMIN D PO) Take 5,000 Int'l Units by mouth. Takes every other day   Yes Historical  Provider, MD  clonazePAM (KLONOPIN) 2 MG tablet Take 1 tablet (2 mg total) by mouth at bedtime as needed. for sleep 06/18/15 12/18/15 Yes Unk Pinto, MD  gemfibrozil (LOPID) 600 MG tablet Take 1 tablet (600 mg total) by mouth 2 (two) times daily. 06/18/15  Yes Unk Pinto, MD  hydrochlorothiazide (HYDRODIURIL) 25 MG tablet TAKE 1 TABLET (25 MG TOTAL) BY MOUTH DAILY. 06/18/15  Yes Unk Pinto, MD    meloxicam (MOBIC) 15 MG tablet Take one daily with food for 2 weeks, can take with tylenol, can not take with aleve, iburpofen, then as needed daily for pain 07/22/15  Yes Vicie Mutters, PA-C  omeprazole (PRILOSEC) 40 MG capsule Take 1 capsule daily for acid indigestion & acid reflux 06/18/15  Yes Unk Pinto, MD  sildenafil (REVATIO) 20 MG tablet 1/2 -1 tablet as needed once daily prior to erection 03/28/14  Yes Vicie Mutters, PA-C   Allergies  Allergen Reactions  . Ace Inhibitors Cough  . Lipitor [Atorvastatin]     Elevates CPK and Aldolase    Social History   Social History  . Marital Status: Married    Spouse Name: N/A  . Number of Children: N/A  . Years of Education: N/A   Social History Main Topics  . Smoking status: Never Smoker   . Smokeless tobacco: Never Used  . Alcohol Use: 7.5 oz/week    15 drink(s) per week  . Drug Use: No  . Sexual Activity: Not Asked   Other Topics Concern  . None   Social History Narrative   family history includes Hypertension in his mother; Seizures in his sister; Stroke in his father.   Wt Readings from Last 3 Encounters:  07/30/15 179 lb (81.194 kg)  07/22/15 176 lb 3.2 oz (79.924 kg)  04/18/15 179 lb 6.4 oz (81.375 kg)    PHYSICAL EXAM BP 115/76 mmHg  Pulse 66  Ht 6' (1.829 m)  Wt 179 lb (81.194 kg)  BMI 24.27 kg/m2 General appearance: alert, cooperative, appears stated age, no distress and Healthy-appearing. Well-nourished and well-groomed. HEENT: [place]/AT, EOMI, MMM, anicteric sclera Neck: no adenopathy, no carotid bruit and no JVD Lungs: CTA B, normal percussion bilaterally and non-labored Heart: regular rate and rhythm, S1 & S2 normal, no murmur, click, rub or gallop; nondisplaced PMI Abdomen: soft, non-tender; bowel sounds normal; no masses,  no organomegaly; no HJR Extremities: extremities normal, atraumatic, no cyanosis or edema  Pulses: 2+ and symmetric;  Skin: mobility and turgor normal, no evidence of bleeding or  bruising and no lesions noted  Neurologic: Mental status: Alert, oriented, thought content appropriate Cranial nerves: normal (II-XII grossly intact)    Adult ECG Report  Rate: 61 ;  Rhythm: normal sinus rhythm and 1 AVB (216). Borderline criteria for LVH. Otherwise nonspecific ST and T-wave changes.; normal axis, intervals and durations.  Narrative Interpretation: Mildly abnormal EKG; no significant change from most recent EKG.   Other studies Reviewed: Additional studies/ records that were reviewed today include:  Recent Labs:     Chemistry      Component Value Date/Time   NA 139 07/22/2015 1622   K 4.0 07/22/2015 1622   CL 100 07/22/2015 1622   CO2 29 07/22/2015 1622   BUN 16 07/22/2015 1622   CREATININE 0.95 07/22/2015 1622      Component Value Date/Time   CALCIUM 9.1 07/22/2015 1622   ALKPHOS 78 07/22/2015 1622   AST 24 07/22/2015 1622   ALT 10 07/22/2015 1622   BILITOT 0.3 07/22/2015  1622     Lab Results  Component Value Date   CKTOTAL 346* 07/22/2015   CKMB 8.5* 07/22/2015   TROPONINI <0.01 07/22/2015   Lab Results  Component Value Date   CHOL 217* 07/22/2015   HDL 71 07/22/2015   LDLCALC 130* 07/22/2015   TRIG 78 07/22/2015   CHOLHDL 3.1 07/22/2015     ASSESSMENT / PLAN: Problem List Items Addressed This Visit    Paroxysmal dyspnea (Chronic)    He has episodes of dyspnea that seemed to be related to his arrhythmia sensation. Really describe PND or orthopnea to suggest heart failure. We are checking a 2-D echocardiogram to exclude any diastolic dysfunction or structural abnormality which could contribute to his dyspnea with palpitations. As he doesn't seem to have any exertional symptoms, I don't think this sounds ischemic, and therefore for now will hold off on ischemic evaluation pending initial event monitor and echocardiogram findings.      Relevant Orders   Cardiac event monitor   Palpitations - Primary    This goes along with irregular heartbeat.  But with the description of palpitations, he is on a high-dose of atenolol and noted that these symptoms occur more frequently intensely when misses a dose. We may need to potentially evaluate to convert to beta blocker.      Relevant Orders   Cardiac event monitor   Irregular heart beat (Chronic)    She is not a very good historian, and doesn't really describe his symptoms very well. I think we need to capture an episode on a monitor. Since he is not having a definitely, I think pronated with 30 day monitor. I will check a 2-D echocardiogram to exclude any structural laterality       Relevant Orders   Cardiac event monitor   Essential hypertension (Chronic)    Stable on beta blocker and HCTZ.         Current medicines are reviewed at length with the patient today. (+/- concerns) None. He had questions about what troponin met. This was answered. The following changes have been made: none   Studies Ordered:   Orders Placed This Encounter  Procedures  . Cardiac event monitor   ROV in 6-8 weeks with Dr Ellyn Hack to follow up for monitor  Total of 30 minutes was spent directly with patient. Greater than 50% of the time was spent with direct consultation and decision making as to the appropriate course of action.    Glenetta Hew, M.D., M.S. Interventional Cardiologist   Pager # [phone] Phone # [phone] [address] [place], [place] [address]

## 2015-07-30 NOTE — Patient Instructions (Addendum)
Your physician has recommended that you wear an event monitor for 30 days. Event monitors are medical devices that record the heart's electrical activity. Doctors most often us these monitors to diagnose arrhythmias. Arrhythmias are problems with the speed or rhythm of the heartbeat. The monitor is a small, portable device. You can wear one while you do your normal daily activities. This is usually used to diagnose what is causing palpitations/syncope (passing out).  No changes with medications   Your physician recommends that you schedule a follow-up appointment in 6-8 weeks with Dr Herbie BaltimoreHARDING to follow up for monitor  Please follow instruction int monitor box   Cardiac Event Monitoring A cardiac event monitor is a small recording device used to help detect abnormal heart rhythms (arrhythmias). The monitor is used to record heart rhythm when noticeable symptoms such as the following occur:  Fast heartbeats (palpitations), such as heart racing or fluttering.  Dizziness.  Fainting or light-headedness.  Unexplained weakness. The monitor is wired to two electrodes placed on your chest. Electrodes are flat, sticky disks that attach to your skin. The monitor can be worn for up to 30 days. You will wear the monitor at all times, except when bathing.  HOW TO USE YOUR CARDIAC EVENT MONITOR A technician will prepare your chest for the electrode placement. The technician will show you how to place the electrodes, how to work the monitor, and how to replace the batteries. Take time to practice using the monitor before you leave the office. Make sure you understand how to send the information from the monitor to your health care provider. This requires a telephone with a landline, not a cell phone. You need to:  Wear your monitor at all times, except when you are in water:  Do not get the monitor wet.  Take the monitor off when bathing. Do not swim or use a hot tub with it on.  Keep your skin clean.  Do not put body lotion or moisturizer on your chest.  Change the electrodes daily or any time they stop sticking to your skin. You might need to use tape to keep them on.  It is possible that your skin under the electrodes could become irritated. To keep this from happening, try to put the electrodes in slightly different places on your chest. However, they must remain in the area under your left breast and in the upper right section of your chest.  Make sure the monitor is safely clipped to your clothing or in a location close to your body that your health care provider recommends.  Press the button to record when you feel symptoms of heart trouble, such as dizziness, weakness, light-headedness, palpitations, thumping, shortness of breath, unexplained weakness, or a fluttering or racing heart. The monitor is always on and records what happened slightly before you pressed the button, so do not worry about being too late to get good information.  Keep a diary of your activities, such as walking, doing chores, and taking medicine. It is especially important to note what you were doing when you pushed the button to record your symptoms. This will help your health care provider determine what might be contributing to your symptoms. The information stored in your monitor will be reviewed by your health care provider alongside your diary entries.  Send the recorded information as recommended by your health care provider. It is important to understand that it will take some time for your health care provider to process the results.  Change the batteries as recommended by your health care provider. SEEK IMMEDIATE MEDICAL CARE IF:   You have chest pain.  You have extreme difficulty breathing or shortness of breath.  You develop a very fast heartbeat that persists.  You develop dizziness that does not go away.  You faint or constantly feel you are about to faint.   This information is not intended  to replace advice given to you by your health care provider. Make sure you discuss any questions you have with your health care provider.no   Document Released: 11/26/2007 Document Revised: 03/09/2014 Document Reviewed: 08/15/2012 Elsevier Interactive Patient Education [employer]

## 2015-07-31 ENCOUNTER — Encounter: Payer: Self-pay | Admitting: Cardiology

## 2015-07-31 DIAGNOSIS — R002 Palpitations: Secondary | ICD-10-CM | POA: Insufficient documentation

## 2015-07-31 DIAGNOSIS — R0600 Dyspnea, unspecified: Secondary | ICD-10-CM | POA: Insufficient documentation

## 2015-07-31 NOTE — Assessment & Plan Note (Signed)
Stable on beta blocker and HCTZ.

## 2015-07-31 NOTE — Assessment & Plan Note (Signed)
She is not a very good historian, and doesn't really describe his symptoms very well. I think we need to capture an episode on a monitor. Since he is not having a definitely, I think pronated with 30 day monitor. I will check a 2-D echocardiogram to exclude any structural laterality

## 2015-07-31 NOTE — Assessment & Plan Note (Signed)
He has episodes of dyspnea that seemed to be related to his arrhythmia sensation. Really describe PND or orthopnea to suggest heart failure. We are checking a 2-D echocardiogram to exclude any diastolic dysfunction or structural abnormality which could contribute to his dyspnea with palpitations. As he doesn't seem to have any exertional symptoms, I don't think this sounds ischemic, and therefore for now will hold off on ischemic evaluation pending initial event monitor and echocardiogram findings.

## 2015-07-31 NOTE — Assessment & Plan Note (Signed)
This goes along with irregular heartbeat. But with the description of palpitations, he is on a high-dose of atenolol and noted that these symptoms occur more frequently intensely when misses a dose. We may need to potentially evaluate to convert to beta blocker.

## 2015-08-07 NOTE — Addendum Note (Signed)
Addended by: Evans LanceSTOVER, Glynda Soliday W on: 08/07/2015 08:35 AM   Modules accepted: Orders

## 2015-08-19 ENCOUNTER — Other Ambulatory Visit: Payer: Self-pay | Admitting: Physician Assistant

## 2015-08-28 DIAGNOSIS — R0600 Dyspnea, unspecified: Secondary | ICD-10-CM | POA: Diagnosis not present

## 2015-08-28 DIAGNOSIS — I499 Cardiac arrhythmia, unspecified: Secondary | ICD-10-CM | POA: Diagnosis not present

## 2015-08-28 DIAGNOSIS — R002 Palpitations: Secondary | ICD-10-CM

## 2015-10-03 ENCOUNTER — Ambulatory Visit (INDEPENDENT_AMBULATORY_CARE_PROVIDER_SITE_OTHER): Payer: Medicare Other | Admitting: Cardiology

## 2015-10-03 ENCOUNTER — Encounter: Payer: Self-pay | Admitting: Cardiology

## 2015-10-03 DIAGNOSIS — I10 Essential (primary) hypertension: Secondary | ICD-10-CM | POA: Diagnosis not present

## 2015-10-03 DIAGNOSIS — R0600 Dyspnea, unspecified: Secondary | ICD-10-CM | POA: Diagnosis not present

## 2015-10-03 DIAGNOSIS — I499 Cardiac arrhythmia, unspecified: Secondary | ICD-10-CM | POA: Diagnosis not present

## 2015-10-03 NOTE — Progress Notes (Signed)
PCP: Nadean Corwin, MD  Clinic Note: Chief Complaint  Patient presents with  . Follow-up    paing and cramping in legs , pt states no other Sx    HPI: Jeremy Boone is a 81 y.o. male with a PMH below who presents today for Cardiology Consultation For episodes of funny heart beats with shortness of breath.  He is a native of [place]. Patient is a very pleasant gentleman who has a history of hypertension and hyperlipidemia as well as "prediabetes ", but no cardiac history to speak of. He was not fully sure that of why he is here, but suggested something about a stress test. I did not seem to think there is any necessity for a stress test.  [name] was seen For his initial consultation on May 30, my intention was for him to have an echocardiogram and an event monitor. Somehow the echo did not get ordered. This was to look for structural other maladies, although was not of vital importance.  Recent Hospitalizations: None  Studies Reviewed: None. Lab reviewed below ? Echo - not done Event Monitor:  Jun 2017   The patient's monitoring period was 07/30/2015 - 08/28/2015. Baseline sample showed Sinus Rhythm with a heart rate of 74.2 bpm.  There were 0 critical, 0 serious, and 5 stable events that occurred. The report analysis of the critical, serious, stable and manually triggered events are listed below.  No notable arrhythmias. Minimal PVCs, no PACs.    Interval History: Mr. Rahil presents today really only mentions is that he had some left leg pain today that made it hard for [place]. It was somewhere in his groin and he can't nestle he tell me where. Otherwise maybe gets a little short of breath when he overexerts but really has not had any symptoms of resting or exertional chest pain/pressure. May be a little short of breath he walks up a hill course steps quickly. No recurrent episodes of syncope or near syncope. He really doesn't complain that he may palpitations.  We reviewed the results of his at monitor which did not show any arrhythmias, PVCs or PACs.  He has not had any TIA or murmurs fugax symptoms. No Near syncopal type symptoms. No heart phase and are PND, orthopnea or edema.  No claudication.  ROS: A comprehensive was performed. Review of Systems  Constitutional: Negative for malaise/fatigue.  HENT: Negative for congestion and nosebleeds.   Respiratory: Negative for shortness of breath.   Cardiovascular: Negative for claudication.  Gastrointestinal: Positive for heartburn. Negative for blood in stool and melena.  Genitourinary: Negative for hematuria.  Musculoskeletal: Negative.  Negative for joint pain.  Neurological: Negative for headaches.  Endo/Heme/Allergies: Does not bruise/bleed easily.  Psychiatric/Behavioral: Nervous/anxious: When he has these spells of irregular heartbeat.     Past Medical History:  Diagnosis Date  . Gout   . Hyperlipidemia   . Hypertension   . Prediabetes     Past Surgical History:  Procedure Laterality Date  . CARPAL TUNNEL RELEASE Left 2007  . OTHER SURGICAL HISTORY  2007   Negative biopsy tonsil mass  . ROTATOR CUFF REPAIR Left 2000    Prior to Admission medications   Medication Sig Start Date End Date Taking? Authorizing Provider  allopurinol (ZYLOPRIM) 300 MG tablet TAKE 1 TABLET BY MOUTH EVERY DAY TO PREVENT GOUT 06/18/15  Yes [name], MD  atenolol (TENORMIN) 100 MG tablet Take 1 tablet (100 mg total) by mouth daily. 06/18/15  Yes [name], MD  Cholecalciferol (VITAMIN D PO) Take 5,000 Int'l Units by mouth. Takes every other day   Yes Historical Provider, MD  clonazePAM (KLONOPIN) 2 MG tablet Take 1 tablet (2 mg total) by mouth at bedtime as needed. for sleep 06/18/15 12/18/15 Yes [name], MD  gemfibrozil (LOPID) 600 MG tablet Take 1 tablet (600 mg total) by mouth 2 (two) times daily. 06/18/15  Yes [name], MD  hydrochlorothiazide (HYDRODIURIL) 25 MG tablet TAKE 1  TABLET (25 MG TOTAL) BY MOUTH DAILY. 06/18/15  Yes [name], MD  meloxicam (MOBIC) 15 MG tablet Take one daily with food for 2 weeks, can take with tylenol, can not take with aleve, iburpofen, then as needed daily for pain 07/22/15  Yes Quentin Mulling, PA-C  omeprazole (PRILOSEC) 40 MG capsule Take 1 capsule daily for acid indigestion & acid reflux 06/18/15  Yes [name], MD  sildenafil (REVATIO) 20 MG tablet 1/2 -1 tablet as needed once daily prior to erection 03/28/14  Yes Quentin Mulling, PA-C   Allergies  Allergen Reactions  . Ace Inhibitors Cough  . Lipitor [Atorvastatin]     Elevates CPK and Aldolase    Social History   Social History  . Marital status: Married    Spouse name: N/A  . Number of children: N/A  . Years of education: N/A   Social History Main Topics  . Smoking status: Never Smoker  . Smokeless tobacco: Never Used  . Alcohol use 7.5 oz/week    15 drink(s) per week  . Drug use: No  . Sexual activity: Not Asked   Other Topics Concern  . None   Social History Narrative  . None   family history includes Hypertension in his mother; Seizures in his sister; Stroke in his father.   Wt Readings from Last 3 Encounters:  10/03/15 176 lb 6.4 oz (80 kg)  07/30/15 179 lb (81.2 kg)  07/22/15 176 lb 3.2 oz (79.9 kg)    PHYSICAL EXAM BP 121/69   Pulse (!) 56   Ht 5' 11.5" (1.816 m)   Wt 176 lb 6.4 oz (80 kg)   BMI 24.26 kg/m  General appearance: alert, cooperative, appears stated age, no distress and Healthy-appearing. Well-nourished and well-groomed. HEENT: /AT, EOMI, MMM, anicteric sclera Lungs: CTA B, normal percussion bilaterally and non-labored Heart: regular rate and rhythm, S1 & S2 normal, no murmur, click, rub or gallop; nondisplaced PMI Abdomen: soft, non-tender; bowel sounds normal; no masses,  no organomegaly; no HJR Extremities: extremities normal, atraumatic, no cyanosis or edema  Pulses: 2+ and symmetric;  Neurologic/Psychiatric:  Mental status: Alert, oriented, thought content appropriate    Adult ECG Report - not checked   Other studies Reviewed: Additional studies/ records that were reviewed today include:  Recent Labs:     Chemistry      Component Value Date/Time   NA 139 07/22/2015 1622   K 4.0 07/22/2015 1622   CL 100 07/22/2015 1622   CO2 29 07/22/2015 1622   BUN 16 07/22/2015 1622   CREATININE 0.95 07/22/2015 1622      Component Value Date/Time   CALCIUM 9.1 07/22/2015 1622   ALKPHOS 78 07/22/2015 1622   AST 24 07/22/2015 1622   ALT 10 07/22/2015 1622   BILITOT 0.3 07/22/2015 1622     Lab Results  Component Value Date   CKTOTAL 346 (H) 07/22/2015   CKMB 8.5 (H) 07/22/2015   TROPONINI <0.01 07/22/2015   Lab Results  Component Value Date   CHOL 217 (H) 07/22/2015  HDL 71 07/22/2015   LDLCALC 130 (H) 07/22/2015   TRIG 78 07/22/2015   CHOLHDL 3.1 07/22/2015     ASSESSMENT / PLAN: Problem List Items Addressed This Visit    Paroxysmal dyspnea (Chronic)    Again, he had brief episodes of feeling short of breath during palpitation spells, these don't seem to be happening anymore. If things recur, I think we could then consider more evaluation with echo plus or minus GXT.  Now refer him back to his PCP unless symptoms recur.      Irregular heart beat (Chronic)    I really did not understand exactly what the symptoms were that had him referred. He has not really had any significant palpitations that he can tell me about since his last visit. He didn't have any symptoms when he was a monitor. He was obviously low but concerned when he was last year, but now he does not seem to be as concerned.       Essential hypertension (Chronic)    Well-controlled on beta blocker and HCTZ. If he does have palpitations, could consider increasing beta blocker, but his heart rate is very slow to begin with -- I think if it is simply just refill episodes, they're probably benign. We can reassess if they  return.       Other Visit Diagnoses   None.     Current medicines are reviewed at length with the patient today. (+/- concerns) None. H The following changes have been made: none   Studies Ordered:   No orders of the defined types were placed in this encounter.  ROV In 6 months, or As needed if symptoms recur. - We can reassess symptoms. No recurrence, will refer back to PCP.    Bryan Lemma, M.D., M.S. Interventional Cardiologist   Pager # [phone] Phone # [phone] [address] [place], [place] [address]

## 2015-10-03 NOTE — Patient Instructions (Signed)
Your physician wants you to follow-up in: 6 MONTHS WITH DR HARDING You will receive a reminder letter in the mail two months in advance. If you don't receive a letter, please call our office to schedule the follow-up appointment.   If you need a refill on your cardiac medications before your next appointment, please call your pharmacy.  

## 2015-10-05 ENCOUNTER — Encounter: Payer: Self-pay | Admitting: Cardiology

## 2015-10-05 NOTE — Assessment & Plan Note (Signed)
Well-controlled on beta blocker and HCTZ. If he does have palpitations, could consider increasing beta blocker, but his heart rate is very slow to begin with -- I think if it is simply just refill episodes, they're probably benign. We can reassess if they return.

## 2015-10-05 NOTE — Assessment & Plan Note (Signed)
I really did not understand exactly what the symptoms were that had him referred. He has not really had any significant palpitations that he can tell me about since his last visit. He didn't have any symptoms when he was a monitor. He was obviously low but concerned when he was last year, but now he does not seem to be as concerned.

## 2015-10-05 NOTE — Assessment & Plan Note (Signed)
Again, he had brief episodes of feeling short of breath during palpitation spells, these don't seem to be happening anymore. If things recur, I think we could then consider more evaluation with echo plus or minus GXT.  Now refer him back to his PCP unless symptoms recur.

## 2015-10-09 ENCOUNTER — Encounter: Payer: Self-pay | Admitting: Internal Medicine

## 2015-10-28 ENCOUNTER — Ambulatory Visit (INDEPENDENT_AMBULATORY_CARE_PROVIDER_SITE_OTHER): Payer: Medicare Other | Admitting: Internal Medicine

## 2015-10-28 ENCOUNTER — Encounter: Payer: Self-pay | Admitting: Internal Medicine

## 2015-10-28 VITALS — BP 106/70 | HR 64 | Temp 97.3°F | Resp 16 | Ht 70.5 in | Wt 177.0 lb

## 2015-10-28 DIAGNOSIS — R7303 Prediabetes: Secondary | ICD-10-CM

## 2015-10-28 DIAGNOSIS — R6889 Other general symptoms and signs: Secondary | ICD-10-CM | POA: Diagnosis not present

## 2015-10-28 DIAGNOSIS — Z125 Encounter for screening for malignant neoplasm of prostate: Secondary | ICD-10-CM

## 2015-10-28 DIAGNOSIS — M1000 Idiopathic gout, unspecified site: Secondary | ICD-10-CM

## 2015-10-28 DIAGNOSIS — G4733 Obstructive sleep apnea (adult) (pediatric): Secondary | ICD-10-CM

## 2015-10-28 DIAGNOSIS — I10 Essential (primary) hypertension: Secondary | ICD-10-CM | POA: Diagnosis not present

## 2015-10-28 DIAGNOSIS — Z23 Encounter for immunization: Secondary | ICD-10-CM | POA: Diagnosis not present

## 2015-10-28 DIAGNOSIS — Z0000 Encounter for general adult medical examination without abnormal findings: Secondary | ICD-10-CM | POA: Diagnosis not present

## 2015-10-28 DIAGNOSIS — K219 Gastro-esophageal reflux disease without esophagitis: Secondary | ICD-10-CM

## 2015-10-28 DIAGNOSIS — E559 Vitamin D deficiency, unspecified: Secondary | ICD-10-CM | POA: Diagnosis not present

## 2015-10-28 DIAGNOSIS — Z1211 Encounter for screening for malignant neoplasm of colon: Secondary | ICD-10-CM

## 2015-10-28 DIAGNOSIS — E785 Hyperlipidemia, unspecified: Secondary | ICD-10-CM

## 2015-10-28 DIAGNOSIS — Z136 Encounter for screening for cardiovascular disorders: Secondary | ICD-10-CM | POA: Diagnosis not present

## 2015-10-28 DIAGNOSIS — Z9989 Dependence on other enabling machines and devices: Secondary | ICD-10-CM

## 2015-10-28 DIAGNOSIS — Z0001 Encounter for general adult medical examination with abnormal findings: Secondary | ICD-10-CM

## 2015-10-28 DIAGNOSIS — Z79899 Other long term (current) drug therapy: Secondary | ICD-10-CM

## 2015-10-28 LAB — LIPID PANEL
CHOL/HDL RATIO: 3.1 ratio (ref <=5.0)
CHOLESTEROL: 236 mg/dL — AB (ref 125–200)
HDL: 76 mg/dL (ref >=40)
LDL Cholesterol: 139 mg/dL — ABNORMAL HIGH (ref <130)
Triglycerides: 104 mg/dL (ref <150)
VLDL: 21 mg/dL (ref <30)

## 2015-10-28 LAB — BASIC METABOLIC PANEL WITH GFR
BUN: 15 mg/dL (ref 7–25)
CALCIUM: 9.4 mg/dL (ref 8.6–10.3)
CHLORIDE: 99 mmol/L (ref 98–110)
CO2: 28 mmol/L (ref 20–31)
CREATININE: 0.96 mg/dL (ref 0.70–1.18)
GFR, Est African American: 87 mL/min (ref >=60)
GFR, Est Non African American: 75 mL/min (ref >=60)
GLUCOSE: 84 mg/dL (ref 65–99)
Potassium: 4 mmol/L (ref 3.5–5.3)
Sodium: 139 mmol/L (ref 135–146)

## 2015-10-28 LAB — HEPATIC FUNCTION PANEL
ALT: 11 U/L (ref 9–46)
AST: 24 U/L (ref 10–35)
Albumin: 4.3 g/dL (ref 3.6–5.1)
Alkaline Phosphatase: 75 U/L (ref 40–115)
Bilirubin, Direct: 0.1 mg/dL (ref <=0.2)
Indirect Bilirubin: 0.2 mg/dL (ref 0.2–1.2)
TOTAL PROTEIN: 7.5 g/dL (ref 6.1–8.1)
Total Bilirubin: 0.3 mg/dL (ref 0.2–1.2)

## 2015-10-28 LAB — CBC WITH DIFFERENTIAL/PLATELET
BASOS ABS: 51 {cells}/uL (ref 0–200)
Basophils Relative: 1 %
EOS ABS: 612 {cells}/uL — AB (ref 15–500)
EOS PCT: 12 %
HEMATOCRIT: 43.5 % (ref 38.5–50.0)
HEMOGLOBIN: 14.6 g/dL (ref 13.2–17.1)
LYMPHS ABS: 2397 {cells}/uL (ref 850–3900)
Lymphocytes Relative: 47 %
MCH: 30.8 pg (ref 27.0–33.0)
MCHC: 33.6 g/dL (ref 32.0–36.0)
MCV: 91.8 fL (ref 80.0–100.0)
MONO ABS: 459 {cells}/uL (ref 200–950)
MPV: 9.5 fL (ref 7.5–12.5)
Monocytes Relative: 9 %
NEUTROS PCT: 31 %
Neutro Abs: 1581 cells/uL (ref 1500–7800)
Platelets: 247 10*3/uL (ref 140–400)
RBC: 4.74 MIL/uL (ref 4.20–5.80)
RDW: 14 % (ref 11.0–15.0)
WBC: 5.1 10*3/uL (ref 3.8–10.8)

## 2015-10-28 LAB — URIC ACID: URIC ACID, SERUM: 5.3 mg/dL (ref 4.0–8.0)

## 2015-10-28 LAB — MAGNESIUM: MAGNESIUM: 2 mg/dL (ref 1.5–2.5)

## 2015-10-28 LAB — TSH: TSH: 3.39 mIU/L (ref 0.40–4.50)

## 2015-10-28 NOTE — Patient Instructions (Signed)

## 2015-10-28 NOTE — Progress Notes (Signed)
[facility]   Jeremy Boone, M.D.    Dyanne Carrel. Steffanie Dunn, P.A.-C      Terri Piedra, P.A.-C   [facility]                [address]                [place], [place]. [address] Telephone [phone] Telefax [phone]  Annual  Screening/Preventative Visit  And  Comprehensive Evaluation & Examination     This very nice 81y.o.MBM presents for a Wellness/Preventative Visit & comprehensive evaluation and management of multiple medical co-morbidities.  Patient has been followed for HTN, T2_NIDDM  Prediabetes, Hyperlipidemia and Vitamin D Deficiency.  Patient also has OSA and is on CPAP  (10cm) which he uses very sporadically and inconsistently as he reports no perceived benefit from using. He also has hx/o Gout which is asymptomatic on treatment .     HTN predates since 1998. Patient's BP has been controlled at home.Today's BP is 106/70. Patient denies any cardiac symptoms as chest pain, palpitations, shortness of breath, dizziness or ankle swelling.     Patient's hyperlipidemia is not controlled with diet and patient is intolerant to Statins, but ins on Gemfibrozil. Patient denies myalgias or other medication SE's.   Last lipids were not at goal:  Lab Results  Component Value Date   CHOL 217 (H) 07/22/2015   HDL 71 07/22/2015   LDLCALC 130 (H) 07/22/2015   TRIG 78 07/22/2015   CHOLHDL 3.1 07/22/2015      Patient has prediabetes with A1c 6.3%  since 2011 and patient denies reactive hypoglycemic symptoms, visual blurring, diabetic polys or paresthesias. Last A1c was not at goal:  Lab Results  Component Value Date   HGBA1C 6.3 (H) 07/22/2015       Finally, patient has history of Vitamin D Deficiency of "25" in 2008 and last vitamin D was at goal:  Lab Results  Component Value Date   VD25OH 71 07/22/2015   Current Outpatient Prescriptions on File Prior to Visit  Medication Sig  . allopurinol  300 MG  TAKE 1 TAB  EVERY DAY   . atenolol  100 MG Take 1 tab daily.  Marland Kitchen VITAMIN D  Take 5,000  Units  . clonazePAM  2 MG  Take tablet at bedtime - usu takes 1/4 tab  . gemfibrozil  600 MG Take 1 tab 2 times daily.  . hydrochlorothiazide  25 MG  TAKE 1 TAB DAILY.  Marland Kitchen omeprazole  40 MG  Take 1 cap daily   . sildenafil  20 MG 1/2 -1 tablet as needed once daily   Allergies  Allergen Reactions  . Ace Inhibitors Cough  . Lipitor [Atorvastatin]     Elevates CPK and Aldolase   Past Medical History:  Diagnosis Date  . Gout   . Hyperlipidemia   . Hypertension   . Prediabetes    Health Maintenance  Topic Date Due  . INFLUENZA VACCINE  10/01/2015  . ZOSTAVAX  01/16/2017 (Originally 03/18/1996)  . TETANUS/TDAP  10/24/2024  . PNA vac Low Risk Adult  Completed   Immunization History  Administered Date(s) Administered  . Influenza, High Dose Seasonal PF 12/18/2013  . Influenza,inj,Quad PF,36+ Mos 03/07/2013  . Pneumococcal Conjugate-13 03/28/2014  . Pneumococcal-Unspecified 03/03/2007  . Td 03/02/2004  . Tdap 10/25/2014   Past Surgical History:  Procedure Laterality Date  . CARPAL TUNNEL RELEASE Left 2007  . OTHER SURGICAL HISTORY  2007  Negative biopsy tonsil mass  . ROTATOR CUFF REPAIR Left 2000   Family History  Problem Relation Age of Onset  . Hypertension Mother   . Stroke Father   . Seizures Sister    Social History   Social History  . Marital status: Married    Spouse name: N/A  . Number of children: N/A  . Years of education: N/A   Occupational History  . Retired Scientist, [employer] - immigrant from [place]   Social History Main Topics  . Smoking status: Never Smoker  . Smokeless tobacco: Never Used  . Alcohol use 7.5 oz/week    15 drink(s) per week  . Drug use: No  . Sexual activity: Not on file    ROS Constitutional: Denies fever, chills, weight loss/gain, headaches, insomnia,  night sweats or change in appetite. Does c/o fatigue. Eyes: Denies redness, blurred vision, diplopia,  discharge, itchy or watery eyes.  ENT: Denies discharge, congestion, post nasal drip, epistaxis, sore throat, earache, hearing loss, dental pain, Tinnitus, Vertigo, Sinus pain or snoring.  Cardio: Denies chest pain, palpitations, irregular heartbeat, syncope, dyspnea, diaphoresis, orthopnea, PND, claudication or edema Respiratory: denies cough, dyspnea, DOE, pleurisy, hoarseness, laryngitis or wheezing.  Gastrointestinal: Denies dysphagia, heartburn, reflux, water brash, pain, cramps, nausea, vomiting, bloating, diarrhea, constipation, hematemesis, melena, hematochezia, jaundice or hemorrhoids Genitourinary: Denies dysuria, frequency, urgency, nocturia, hesitancy, discharge, hematuria or flank pain Musculoskeletal: Denies arthralgia, myalgia, stiffness, Jt. Swelling, pain, limp or strain/sprain. Denies Falls. Skin: Denies puritis, rash, hives, warts, acne, eczema or change in skin lesion Neuro: No weakness, tremor, incoordination, spasms, paresthesia or pain Psychiatric: Denies confusion, memory loss or sensory loss. Denies Depression. Endocrine: Denies change in weight, skin, hair change, nocturia, and paresthesia, diabetic polys, visual blurring or hyper / hypo glycemic episodes.  Heme/Lymph: No excessive bleeding, bruising or enlarged lymph nodes.  Physical Exam  BP 106/70   Pulse 64   Temp 97.3 F (36.3 C)   Resp 16   Ht 5' 10.5" (1.791 m)   Wt 177 lb (80.3 kg)   BMI 25.04 kg/m   General Appearance: Well nourished, in no apparent distress.  Eyes: PERRLA, EOMs, conjunctiva no swelling or erythema, normal fundi and vessels. Sinuses: No frontal/maxillary tenderness ENT/Mouth: EACs patent / TMs  nl. Nares clear without erythema, swelling, mucoid exudates. Oral hygiene is good. No erythema, swelling, or exudate. Tongue normal, non-obstructing. Tonsils not swollen or erythematous. Hearing normal.  Neck: Supple, thyroid normal. No bruits, nodes or JVD. Respiratory: Respiratory effort  normal.  BS equal and clear bilateral without rales, rhonci, wheezing or stridor. Cardio: Heart sounds are normal with regular rate and rhythm and no murmurs, rubs or gallops. Peripheral pulses are normal and equal bilaterally without edema. No aortic or femoral bruits. Chest: symmetric with normal excursions and percussion.  Abdomen: Soft, with Nl bowel sounds. Nontender, no guarding, rebound, hernias, masses, or organomegaly.  Lymphatics: Non tender without lymphadenopathy.  Genitourinary: No hernias.Testes nl. DRE - prostate nl for age - smooth & firm w/o nodules. Musculoskeletal: Full ROM all peripheral extremities, joint stability, 5/5 strength, and normal gait. Skin: Warm and dry without rashes, lesions, cyanosis, clubbing or  ecchymosis.  Neuro: Cranial nerves intact, reflexes equal bilaterally. Normal muscle tone, no cerebellar symptoms. Sensation intact.  Pysch: Alert and oriented X 3 with normal affect, insight and judgment appropriate.   Assessment and Plan  1. Annual Preventative/Screening Exam   - Microalbumin / creatinine urine ratio - EKG 12-Lead - US, RETROPERITNL ABD,  LTD - POC  Hemoccult Bld/Stl  - Urinalysis, Routine w reflex microscopic  - PSA - Uric acid - CBC with Differential/Platelet - BASIC METABOLIC PANEL WITH GFR - Hepatic function panel - Magnesium - Lipid panel - TSH - Hemoglobin A1c - Insulin, random - VITAMIN D 25 Hydroxy   2. Essential hypertension  - Microalbumin / creatinine urine ratio - EKG 12-Lead - [place], RETROPERITNL ABD,  LTD - TSH  3. Hyperlipidemia  - EKG 12-Lead - [place], RETROPERITNL ABD,  LTD - Lipid panel - TSH  4. Prediabetes  - EKG 12-Lead - [place], RETROPERITNL ABD,  LTD - Hemoglobin A1c - Insulin, random  5. Vitamin D deficiency  - VITAMIN D 25 Hydroxy   6. OSA on CPAP   7. Idiopathic gout  - Uric acid  8. Gastroesophageal reflux disease   9. Special screening for malignant neoplasms, colon  - POC Hemoccult  Bld/Stl   10. Prostate cancer screening  - PSA  11. Screening for AAA (aortic abdominal aneurysm)  - [place], RETROPERITNL ABD,  LTD  12. Screening for ischemic heart disease  - EKG 12-Lead  13. Medication management  - Urinalysis, Routine w reflex microscopic  - CBC with Differential/Platelet - BASIC METABOLIC PANEL WITH GFR - Hepatic function panel - Magnesium  14. Need for prophylactic vaccination and inoculation against influenza  - Flu vaccine HIGH DOSE PF (Fluzone High dose)      Continue prudent diet as discussed, weight control, BP monitoring, regular exercise, and medications as discussed.  Discussed med effects and SE's. Routine screening labs and tests as requested with regular follow-up as recommended. Over 40 minutes of exam, counseling, chart review and high complex critical decision making was performed

## 2015-10-29 LAB — URINALYSIS, ROUTINE W REFLEX MICROSCOPIC
BILIRUBIN URINE: NEGATIVE
Glucose, UA: NEGATIVE
KETONES UR: NEGATIVE
Leukocytes, UA: NEGATIVE
NITRITE: NEGATIVE
PH: 7 (ref 5.0–8.0)
Protein, ur: NEGATIVE
Specific Gravity, Urine: 1.017 (ref 1.001–1.035)

## 2015-10-29 LAB — URINALYSIS, MICROSCOPIC ONLY
BACTERIA UA: NONE SEEN [HPF]
CRYSTALS: NONE SEEN [HPF]
Casts: NONE SEEN [LPF]
SQUAMOUS EPITHELIAL / LPF: NONE SEEN [HPF] (ref <=5)
WBC UA: NONE SEEN WBC/HPF (ref <=5)
Yeast: NONE SEEN [HPF]

## 2015-10-29 LAB — MICROALBUMIN / CREATININE URINE RATIO
CREATININE, URINE: 135 mg/dL (ref 20–370)
MICROALB UR: 0.3 mg/dL
Microalb Creat Ratio: 2 mcg/mg creat (ref <30)

## 2015-10-29 LAB — PSA: PSA: 3.3 ng/mL (ref <=4.0)

## 2015-10-29 LAB — HEMOGLOBIN A1C
HEMOGLOBIN A1C: 6 % — AB (ref <5.7)
Mean Plasma Glucose: 126 mg/dL

## 2015-10-29 LAB — INSULIN, RANDOM: Insulin: 4.1 u[IU]/mL (ref 2.0–19.6)

## 2015-10-29 LAB — VITAMIN D 25 HYDROXY (VIT D DEFICIENCY, FRACTURES): VIT D 25 HYDROXY: 68 ng/mL (ref 30–100)

## 2015-11-30 ENCOUNTER — Other Ambulatory Visit: Payer: Self-pay | Admitting: Internal Medicine

## 2016-01-03 ENCOUNTER — Other Ambulatory Visit: Payer: Self-pay | Admitting: Internal Medicine

## 2016-01-15 IMAGING — RF DG ESOPHAGUS
9 of 13 series · 15 of 24 positions shown · non-contrast
Comparison: None.

CLINICAL DATA: Dysphasia

EXAM:
ESOPHOGRAM / BARIUM SWALLOW / BARIUM TABLET STUDY
TECHNIQUE: Combined double contrast and single contrast examination performed
using effervescent crystals, thick barium liquid, and thin barium
liquid. The patient was observed with fluoroscopy swallowing a 13mm
barium sulphate tablet.
FLUOROSCOPY TIME:  One Min and 53 seconds

[Series 1: run · 7 of 30 slices shown (1 of 9)]
[im 1/30]
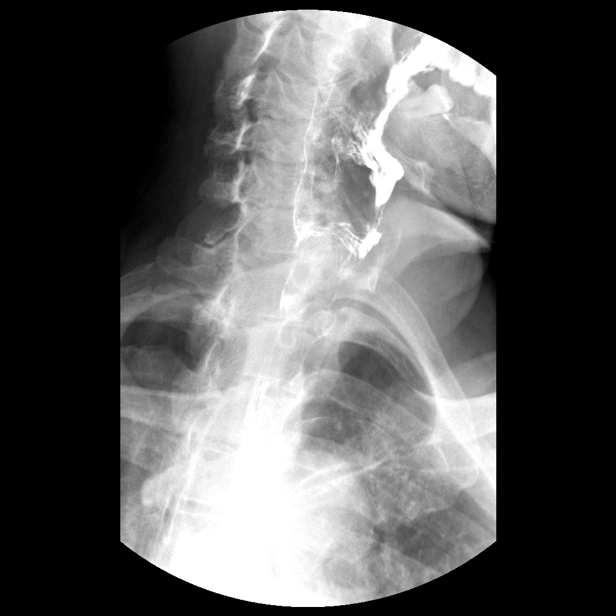
[im 6/30]
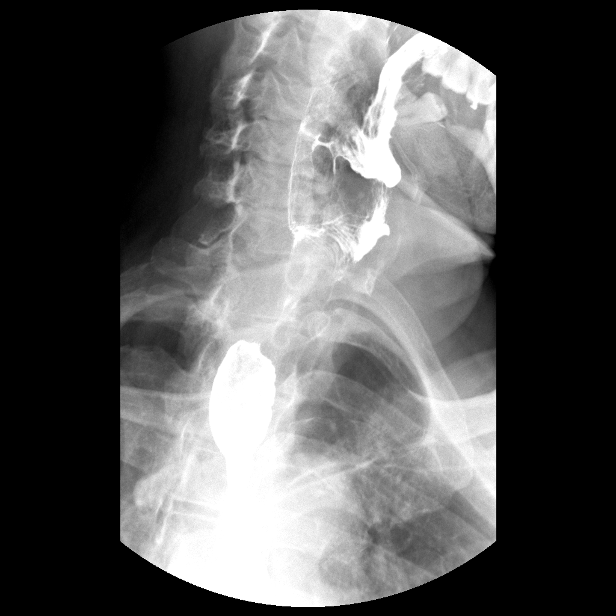
[im 11/30]
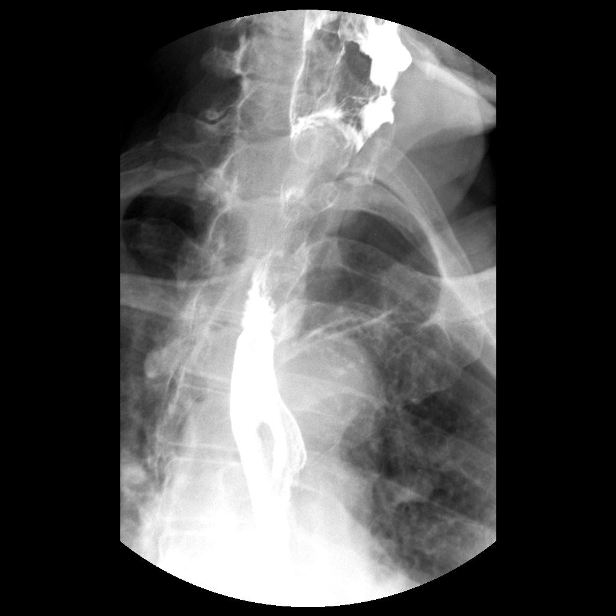
[im 14/30]
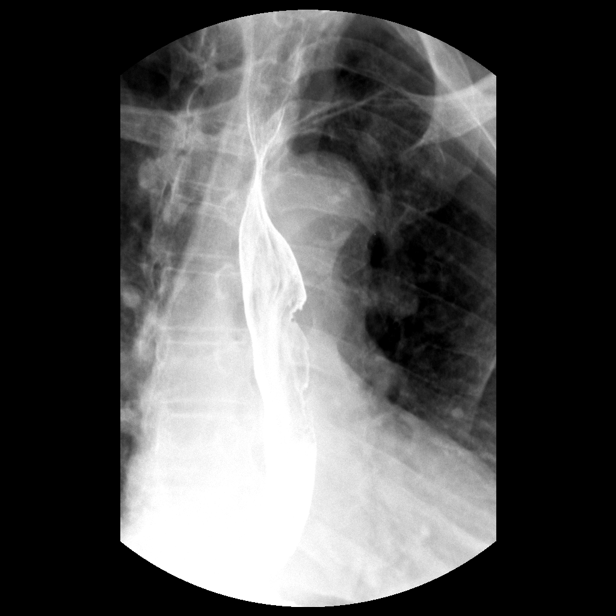
[im 19/30]
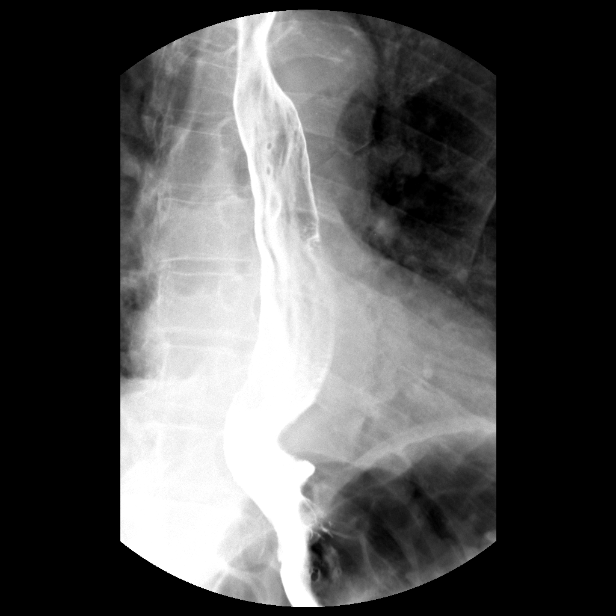
[im 22/30]
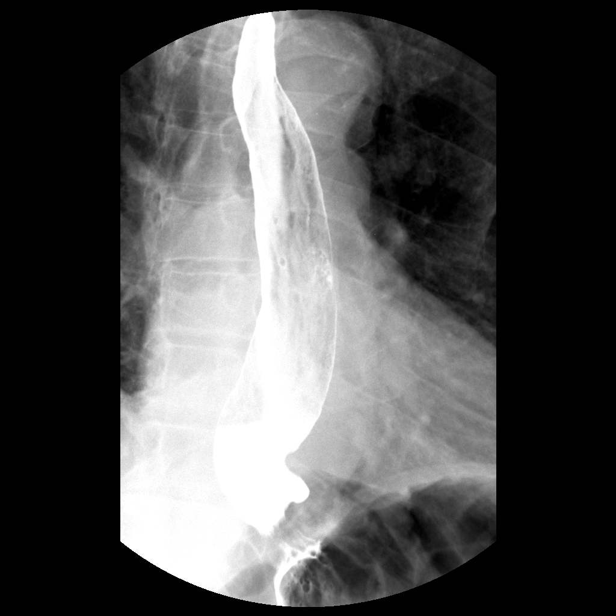
[im 27/30]
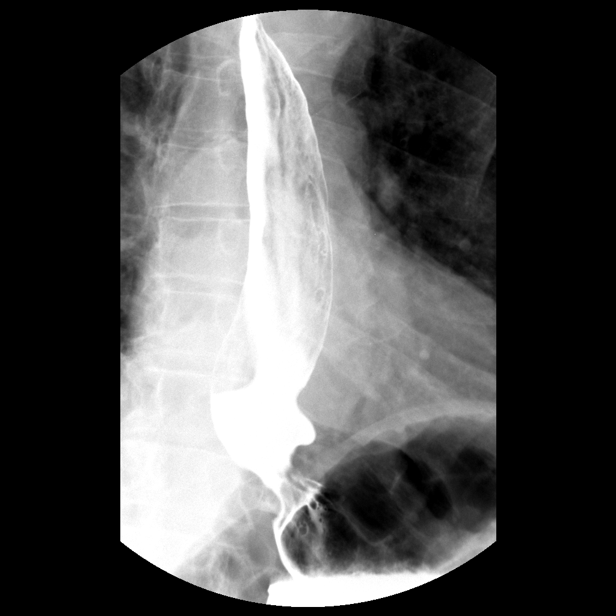

[Series 2: run · 1 of 1 slices shown (2 of 9)]
[im 1/1]
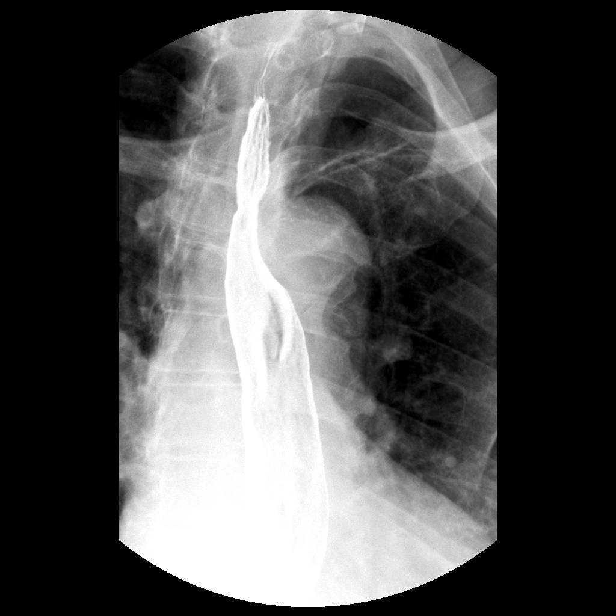

[Series 3: run · 1 of 1 slices shown (3 of 9)]
[im 1/1]
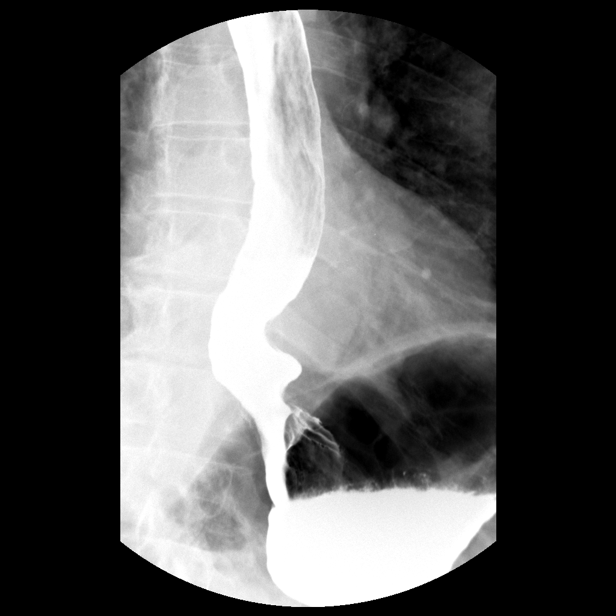

[Series 5: run · 1 of 1 slices shown (4 of 9)]
[im 1/1]
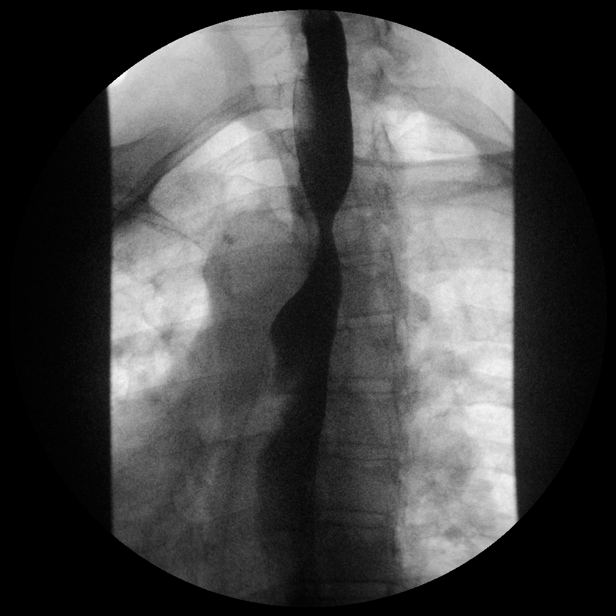

[Series 6: run · 1 of 1 slices shown (5 of 9)]
[im 1/1]
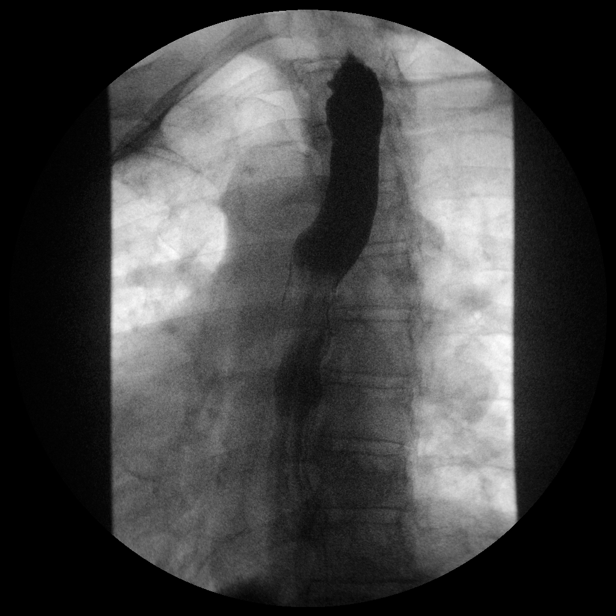

[Series 8: run · 1 of 1 slices shown (6 of 9)]
[im 1/1]
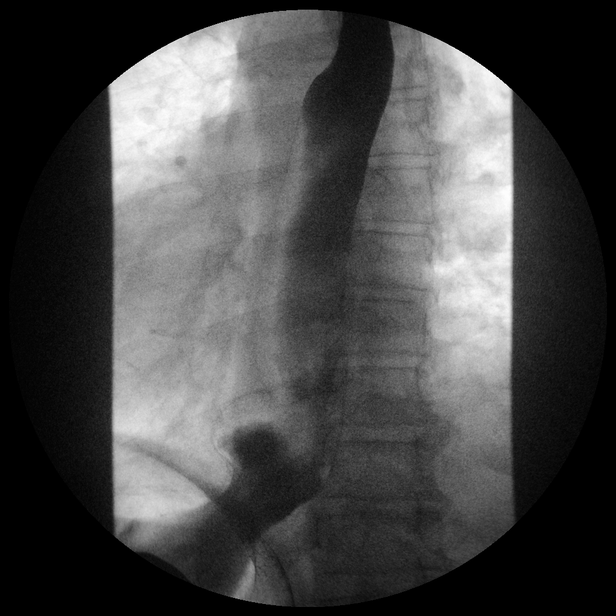

[Series 10: run · 1 of 1 slices shown (7 of 9)]
[im 1/1]
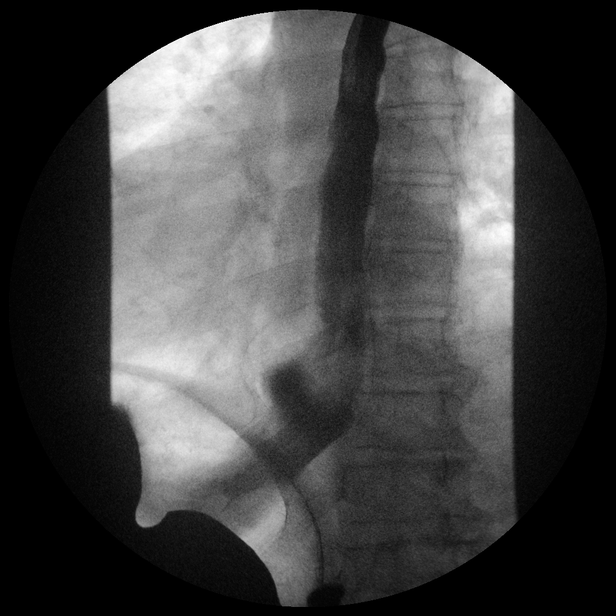

[Series 11: run · 1 of 1 slices shown (8 of 9)]
[im 1/1]
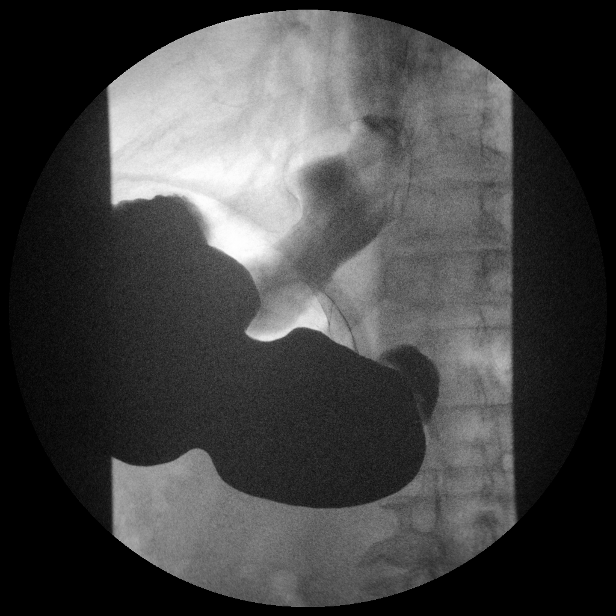

[Series 13: run · 1 of 1 slices shown (9 of 9)]
[im 1/1]
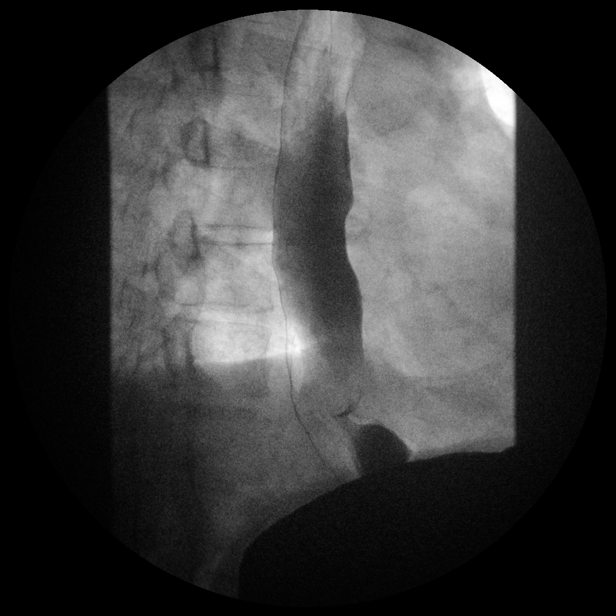

[15 of 24 positions shown; findings below may reference images not displayed]

FINDINGS: Relatively patulous esophagus with mild decrease in motility.
Negative for stricture or mass. No mucosal edema or ulceration
identified.

Small hiatal hernia.  Moderate gastroesophageal reflux.

Barium tablet passed readily into the stomach without delay.
IMPRESSION: Hiatal hernia with gastroesophageal reflux. Mild esophageal
dysmotility.

## 2016-01-29 ENCOUNTER — Ambulatory Visit (INDEPENDENT_AMBULATORY_CARE_PROVIDER_SITE_OTHER): Payer: Medicare Other | Admitting: Physician Assistant

## 2016-01-29 ENCOUNTER — Encounter: Payer: Self-pay | Admitting: Physician Assistant

## 2016-01-29 VITALS — BP 118/74 | HR 76 | Resp 16 | Ht 70.5 in | Wt 184.0 lb

## 2016-01-29 DIAGNOSIS — E785 Hyperlipidemia, unspecified: Secondary | ICD-10-CM | POA: Diagnosis not present

## 2016-01-29 DIAGNOSIS — Z79899 Other long term (current) drug therapy: Secondary | ICD-10-CM

## 2016-01-29 DIAGNOSIS — I10 Essential (primary) hypertension: Secondary | ICD-10-CM

## 2016-01-29 DIAGNOSIS — R7303 Prediabetes: Secondary | ICD-10-CM

## 2016-01-29 DIAGNOSIS — E559 Vitamin D deficiency, unspecified: Secondary | ICD-10-CM

## 2016-01-29 DIAGNOSIS — M1000 Idiopathic gout, unspecified site: Secondary | ICD-10-CM | POA: Diagnosis not present

## 2016-01-29 DIAGNOSIS — Z9989 Dependence on other enabling machines and devices: Secondary | ICD-10-CM

## 2016-01-29 DIAGNOSIS — G4733 Obstructive sleep apnea (adult) (pediatric): Secondary | ICD-10-CM

## 2016-01-29 LAB — CBC WITH DIFFERENTIAL/PLATELET
BASOS ABS: 51 {cells}/uL (ref 0–200)
Basophils Relative: 1 %
EOS ABS: 612 {cells}/uL — AB (ref 15–500)
Eosinophils Relative: 12 %
HCT: 44.1 % (ref 38.5–50.0)
Hemoglobin: 14.4 g/dL (ref 13.2–17.1)
LYMPHS PCT: 46 %
Lymphs Abs: 2346 cells/uL (ref 850–3900)
MCH: 30.6 pg (ref 27.0–33.0)
MCHC: 32.7 g/dL (ref 32.0–36.0)
MCV: 93.6 fL (ref 80.0–100.0)
MONOS PCT: 9 %
MPV: 9.7 fL (ref 7.5–12.5)
Monocytes Absolute: 459 cells/uL (ref 200–950)
NEUTROS ABS: 1632 {cells}/uL (ref 1500–7800)
Neutrophils Relative %: 32 %
PLATELETS: 239 10*3/uL (ref 140–400)
RBC: 4.71 MIL/uL (ref 4.20–5.80)
RDW: 14.1 % (ref 11.0–15.0)
WBC: 5.1 10*3/uL (ref 3.8–10.8)

## 2016-01-29 LAB — HEMOGLOBIN A1C
HEMOGLOBIN A1C: 5.8 % — AB (ref <5.7)
MEAN PLASMA GLUCOSE: 120 mg/dL

## 2016-01-29 LAB — TSH: TSH: 3.13 m[IU]/L (ref 0.40–4.50)

## 2016-01-29 NOTE — Progress Notes (Signed)
Assessment and Plan:   Hypertension -Continue medication, monitor blood pressure at home. Continue DASH diet.  Reminder to go to the ER if any CP, SOB, nausea, dizziness, severe HA, changes vision/speech, left arm numbness and tingling and jaw pain.  Cholesterol -Continue diet and exercise. Check cholesterol.    Prediabetes  -Continue diet and exercise. Check A1C  Vitamin D Def - check level and continue medications.   Continue diet and meds as discussed. Further disposition pending results of labs. Over 30 minutes of exam, counseling, chart review, and critical decision making was performed  Future Appointments Date Time Provider Department Center  04/30/2016 3:30 PM Lucky CowboyWilliam McKeown, MD [facility] None  12/10/2016 2:00 PM Lucky CowboyWilliam McKeown, MD [facility] None     HPI 81 y.o. male  presents for 3 month follow up on hypertension, cholesterol, prediabetes, and vitamin D deficiency.   His blood pressure has been controlled at home, today their BP is BP: 118/74  He does not workout. He denies chest pain, shortness of breath, dizziness. He sporadically wears his CPAP, discussed compliance.   He is not on cholesterol medication, he is on lopid due to intolerance of statins and denies myalgias. His cholesterol is not at goal. The cholesterol last visit was:   Lab Results  Component Value Date   CHOL 236 (H) 10/28/2015   HDL 76 10/28/2015   LDLCALC 139 (H) 10/28/2015   TRIG 104 10/28/2015   CHOLHDL 3.1 10/28/2015    He has been working on diet and exercise for prediabetes, and denies paresthesia of the feet, polydipsia, polyuria and visual disturbances. Last A1C in the office was:  Lab Results  Component Value Date   HGBA1C 6.0 (H) 10/28/2015   Patient is on Vitamin D supplement.   Lab Results  Component Value Date   VD25OH 68 10/28/2015       Current Medications:  Current Outpatient Prescriptions on File Prior to Visit  Medication Sig Dispense Refill  . allopurinol  (ZYLOPRIM) 300 MG tablet TAKE 1 TABLET BY MOUTH  EVERY DAY TO PREVENT GOUT 90 tablet 0  . atenolol (TENORMIN) 100 MG tablet Take 1 tablet (100 mg total) by mouth daily. 90 tablet 1  . Cholecalciferol (VITAMIN D PO) Take 5,000 Int'l Units by mouth. Takes every other day    . gemfibrozil (LOPID) 600 MG tablet TAKE 1 TABLET BY MOUTH TWO  TIMES DAILY 180 tablet 0  . hydrochlorothiazide (HYDRODIURIL) 25 MG tablet TAKE 1 TABLET BY MOUTH  DAILY 90 tablet 0  . meloxicam (MOBIC) 15 MG tablet TAKE 1 TABLET DAILY WITH   FOOD FOR 2 WEEKS THEN DAILY AS NEEDED FOR PAIN (CAN   TAKE TYLENOL BUT CANNOT  TAKE ALEVE OR IBUPROFEN) 90 tablet 0  . omeprazole (PRILOSEC) 40 MG capsule TAKE 1 CAPSULE DAILY FOR ACID INDIGESTION & ACID REFLUX 90 capsule 1  . sildenafil (REVATIO) 20 MG tablet 1/2 -1 tablet as needed once daily prior to erection 10 tablet 1  . clonazePAM (KLONOPIN) 2 MG tablet Take 1 tablet (2 mg total) by mouth at bedtime as needed. for sleep 90 tablet 1   No current facility-administered medications on file prior to visit.    Medical History:  Past Medical History:  Diagnosis Date  . Gout   . Hyperlipidemia   . Hypertension   . Prediabetes    Allergies:  Allergies  Allergen Reactions  . Ace Inhibitors Cough  . Lipitor [Atorvastatin]     Elevates CPK and Aldolase  Review of Systems:  Review of Systems  Constitutional: Positive for malaise/fatigue. Negative for chills and fever.  HENT: Negative.   Eyes: Negative.   Respiratory: Negative.   Cardiovascular: Negative.   Gastrointestinal: Negative.   Genitourinary: Negative.   Musculoskeletal: Negative.   Skin: Negative.   Neurological: Negative.  Negative for weakness.  Endo/Heme/Allergies: Negative.   Psychiatric/Behavioral: Negative.     Family history- Review and unchanged Social history- Review and unchanged Physical Exam: BP 118/74   Pulse 76   Resp 16   Ht 5' 10.5" (1.791 m)   Wt 184 lb (83.5 kg)   SpO2 96%   BMI 26.03  kg/m  Wt Readings from Last 3 Encounters:  01/29/16 184 lb (83.5 kg)  10/28/15 177 lb (80.3 kg)  10/03/15 176 lb 6.4 oz (80 kg)   General Appearance: Well nourished, in no apparent distress. Eyes: PERRLA, EOMs, conjunctiva no swelling or erythema Sinuses: No Frontal/maxillary tenderness ENT/Mouth: Ext aud canals clear, TMs without erythema, bulging. No erythema, swelling, or exudate on post pharynx.  Tonsils not swollen or erythematous. Hearing normal.  Neck: Supple, thyroid normal.  Respiratory: Respiratory effort normal, BS equal bilaterally without rales, rhonchi, wheezing or stridor.  Cardio: RRR with no MRGs. Brisk peripheral pulses without edema.  Abdomen: Soft, + BS,  Non tender, no guarding, rebound, hernias, masses. Lymphatics: Non tender without lymphadenopathy.  Musculoskeletal: Full ROM, 5/5 strength, antalgic gait Skin: Warm, dry without rashes, lesions, ecchymosis.  Neuro: Cranial nerves intact. Normal muscle tone, no cerebellar symptoms. Psych: Awake and oriented X 3, normal affect, Insight and Judgment appropriate.    Quentin MullingAmanda Zuleyma Scharf, PA-C 3:47 PM [facility]

## 2016-01-29 NOTE — Patient Instructions (Addendum)
DRINK AT LEAST 64 OZ OF WATER A DAY Get on CPAP NIGHTLY  Fatigue Introduction Fatigue is feeling tired all of the time, a lack of energy, or a lack of motivation. Occasional or mild fatigue is often a normal response to activity or life in general. However, long-lasting (chronic) or extreme fatigue may indicate an underlying medical condition. Follow these instructions at home: Watch your fatigue for any changes. The following actions may help to lessen any discomfort you are feeling:  Talk to your health care provider about how much sleep you need each night. Try to get the required amount every night.  Take medicines only as directed by your health care provider.  Eat a healthy and nutritious diet. Ask your health care provider if you need help changing your diet.  Drink enough fluid to keep your urine clear or pale yellow.  Practice ways of relaxing, such as yoga, meditation, massage therapy, or acupuncture.  Exercise regularly.  Change situations that cause you stress. Try to keep your work and personal routine reasonable.  Do not abuse illegal drugs.  Limit alcohol intake to no more than 1 drink per day for nonpregnant women and 2 drinks per day for men. One drink equals 12 ounces of beer, 5 ounces of wine, or 1 ounces of hard liquor.  Take a multivitamin, if directed by your health care provider. Contact a health care provider if:  Your fatigue does not get better.  You have a fever.  You have unintentional weight loss or gain.  You have headaches.  You have difficulty:  Falling asleep.  Sleeping throughout the night.  You feel angry, guilty, anxious, or sad.  You are unable to have a bowel movement (constipation).  You skin is dry.  Your legs or another part of your body is swollen. Get help right away if:  You feel confused.  Your vision is blurry.  You feel faint or pass out.  You have a severe headache.  You have severe abdominal, pelvic, or  back pain.  You have chest pain, shortness of breath, or an irregular or fast heartbeat.  You are unable to urinate or you urinate less than normal.  You develop abnormal bleeding, such as bleeding from the rectum, vagina, nose, lungs, or nipples.  You vomit blood.  You have thoughts about harming yourself or committing suicide.  You are worried that you might harm someone else. This information is not intended to replace advice given to you by your health care provider. Make sure you discuss any questions you have with your health care provider. Document Released: 12/14/2006 Document Revised: 07/25/2015 Document Reviewed: 06/20/2013  2017 Elsevier  I think it is possible that you have sleep apnea. It can cause interrupted sleep, headaches, frequent awakenings, fatigue, dry mouth, fast/slow heart beats, memory issues, anxiety/depression, swelling, numbness tingling hands/feet, weight gain, shortness of breath, and the list goes on. Sleep apnea needs to be ruled out because if it is left untreated it does eventually lead to abnormal heart beats, lung failure or heart failure as well as increasing the risk of heart attack and stroke. There are masks you can wear OR a mouth piece that I can give you information about. Often times though people feel MUCH better after getting treatment.   Sleep Apnea  Sleep apnea is a sleep disorder characterized by abnormal pauses in breathing while you sleep. When your breathing pauses, the level of oxygen in your blood decreases. This causes you to move out  of deep sleep and into light sleep. As a result, your quality of sleep is poor, and the system that carries your blood throughout your body (cardiovascular system) experiences stress. If sleep apnea remains untreated, the following conditions can develop:  High blood pressure (hypertension).  Coronary artery disease.  Inability to achieve or maintain an erection (impotence).  Impairment of your thought  process (cognitive dysfunction). There are three types of sleep apnea: 1. Obstructive sleep apnea--Pauses in breathing during sleep because of a blocked airway. 2. Central sleep apnea--Pauses in breathing during sleep because the area of the brain that controls your breathing does not send the correct signals to the muscles that control breathing. 3. Mixed sleep apnea--A combination of both obstructive and central sleep apnea.  RISK FACTORS The following risk factors can increase your risk of developing sleep apnea:  Being overweight.  Smoking.  Having narrow passages in your nose and throat.  Being of older age.  Being male.  Alcohol use.  Sedative and tranquilizer use.  Ethnicity. Among individuals younger than 35 years, African Americans are at increased risk of sleep apnea. SYMPTOMS   Difficulty staying asleep.  Daytime sleepiness and fatigue.  Loss of energy.  Irritability.  Loud, heavy snoring.  Morning headaches.  Trouble concentrating.  Forgetfulness.  Decreased interest in sex. DIAGNOSIS  In order to diagnose sleep apnea, your caregiver will perform a physical examination. Your caregiver may suggest that you take a home sleep test. Your caregiver may also recommend that you spend the night in a sleep lab. In the sleep lab, several monitors record information about your heart, lungs, and brain while you sleep. Your leg and arm movements and blood oxygen level are also recorded. TREATMENT The following actions may help to resolve mild sleep apnea:  Sleeping on your side.   Using a decongestant if you have nasal congestion.   Avoiding the use of depressants, including alcohol, sedatives, and narcotics.   Losing weight and modifying your diet if you are overweight. There also are devices and treatments to help open your airway:  Oral appliances. These are custom-made mouthpieces that shift your lower jaw forward and slightly open your bite. This opens  your airway.  Devices that create positive airway pressure. This positive pressure "splints" your airway open to help you breathe better during sleep. The following devices create positive airway pressure:  Continuous positive airway pressure (CPAP) device. The CPAP device creates a continuous level of air pressure with an air pump. The air is delivered to your airway through a mask while you sleep. This continuous pressure keeps your airway open.  Nasal expiratory positive airway pressure (EPAP) device. The EPAP device creates positive air pressure as you exhale. The device consists of single-use valves, which are inserted into each nostril and held in place by adhesive. The valves create very little resistance when you inhale but create much more resistance when you exhale. That increased resistance creates the positive airway pressure. This positive pressure while you exhale keeps your airway open, making it easier to breath when you inhale again.  Bilevel positive airway pressure (BPAP) device. The BPAP device is used mainly in patients with central sleep apnea. This device is similar to the CPAP device because it also uses an air pump to deliver continuous air pressure through a mask. However, with the BPAP machine, the pressure is set at two different levels. The pressure when you exhale is lower than the pressure when you inhale.  Surgery. Typically, surgery is  only done if you cannot comply with less invasive treatments or if the less invasive treatments do not improve your condition. Surgery involves removing excess tissue in your airway to create a wider passage way. Document Released: 02/06/2002 Document Revised: 06/13/2012 Document Reviewed: 06/25/2011 [facility]ExitCare Patient Information 2015 [employer] [place]. This information is not intended to replace advice given to you by your health care provider. Make sure you discuss any questions you have with your health care provider.

## 2016-01-30 LAB — LIPID PANEL
Cholesterol: 226 mg/dL — ABNORMAL HIGH (ref <200)
HDL: 64 mg/dL (ref >40)
LDL Cholesterol: 133 mg/dL — ABNORMAL HIGH (ref <100)
Total CHOL/HDL Ratio: 3.5 Ratio (ref <5.0)
Triglycerides: 145 mg/dL (ref <150)
VLDL: 29 mg/dL (ref <30)

## 2016-01-30 LAB — HEPATIC FUNCTION PANEL
ALBUMIN: 4 g/dL (ref 3.6–5.1)
ALT: 13 U/L (ref 9–46)
AST: 29 U/L (ref 10–35)
Alkaline Phosphatase: 78 U/L (ref 40–115)
BILIRUBIN TOTAL: 0.4 mg/dL (ref 0.2–1.2)
Bilirubin, Direct: 0.1 mg/dL (ref <=0.2)
Indirect Bilirubin: 0.3 mg/dL (ref 0.2–1.2)
Total Protein: 7.4 g/dL (ref 6.1–8.1)

## 2016-01-30 LAB — BASIC METABOLIC PANEL WITH GFR
BUN: 18 mg/dL (ref 7–25)
CHLORIDE: 101 mmol/L (ref 98–110)
CO2: 24 mmol/L (ref 20–31)
CREATININE: 1.14 mg/dL (ref 0.70–1.18)
Calcium: 9.1 mg/dL (ref 8.6–10.3)
GFR, Est African American: 70 mL/min (ref >=60)
GFR, Est Non African American: 61 mL/min (ref >=60)
Glucose, Bld: 108 mg/dL — ABNORMAL HIGH (ref 65–99)
Potassium: 3.7 mmol/L (ref 3.5–5.3)
Sodium: 137 mmol/L (ref 135–146)

## 2016-01-30 LAB — MAGNESIUM: MAGNESIUM: 1.9 mg/dL (ref 1.5–2.5)

## 2016-01-30 LAB — VITAMIN D 25 HYDROXY (VIT D DEFICIENCY, FRACTURES): VIT D 25 HYDROXY: 76 ng/mL (ref 30–100)

## 2016-02-13 ENCOUNTER — Other Ambulatory Visit: Payer: Self-pay | Admitting: Internal Medicine

## 2016-03-11 ENCOUNTER — Other Ambulatory Visit: Payer: Self-pay | Admitting: Physician Assistant

## 2016-03-11 MED ORDER — CLONAZEPAM 2 MG PO TABS: 2.0000 mg | ORAL_TABLET | Freq: Every evening | ORAL | 1 refills | Status: DC | PRN

## 2016-03-17 ENCOUNTER — Ambulatory Visit (INDEPENDENT_AMBULATORY_CARE_PROVIDER_SITE_OTHER): Payer: Medicare Other | Admitting: Cardiology

## 2016-03-17 ENCOUNTER — Encounter: Payer: Self-pay | Admitting: Cardiology

## 2016-03-17 DIAGNOSIS — I10 Essential (primary) hypertension: Secondary | ICD-10-CM

## 2016-03-17 DIAGNOSIS — R002 Palpitations: Secondary | ICD-10-CM | POA: Diagnosis not present

## 2016-03-17 DIAGNOSIS — R0600 Dyspnea, unspecified: Secondary | ICD-10-CM | POA: Diagnosis not present

## 2016-03-17 DIAGNOSIS — E782 Mixed hyperlipidemia: Secondary | ICD-10-CM | POA: Diagnosis not present

## 2016-03-17 NOTE — Progress Notes (Signed)
PCP: Nadean CorwinMCKEOWN,WILLIAM Naijah Lacek, MD  Clinic Note: Chief Complaint  Patient presents with  . Follow-up  . Shortness of Breath    due to exertion.   . Dizziness    when BP was low due to water pill.Primary cut dose in half. Pt has been dong better since medication change.     HPI: Jeremy Boone is a 82 y.o. male with a PMH below who presents today for Cardiology Consultation For episodes of funny heart beats with shortness of breath.  He [place] native of [place]. Patient is a very pleasant gentleman who has a history of hypertension and hyperlipidemia as well as "prediabetes ", but no cardiac history to speak of.   Jeremy Boone was Last seen in August 2017 - he was doing relatively well without any major symptoms. A little bit of exertional dyspnea and nothing significant. He talked about the possibility of doing a stress test to evaluate exertional dyspnea if it worsened. His regular heartbeats were stable. Since I last saw him, his PCP has reduced his HCTZ dose. He has actually started just taking it Monday Wednesday and Friday as opposed to taking a half dose daily.  Recent Hospitalizations: None  Studies Reviewed: None  Interval History: Jeremy Boone presents today without any major complaints. He still has some exertional dyspnea if he overdoes it. Basically he says "if he tries to do anything too much "he will get some short of breath, but is not something that bothers him very much. He has a little bit of fatigue, but also indicates that he doesn't do much exertional activity anyway. No PND or orthopnea. No resting or exertional chest tightness/pressure. As far as his palpitations, he really has minimal complaints now.  No recurrent episodes of syncope or near syncope. He   He has not had any TIA or murmurs fugax symptoms. No Near syncopal type symptoms. No heart phase and are PND, orthopnea or edema.  No claudication.  ROS: A comprehensive was performed. Review of Systems    Constitutional: Negative for malaise/fatigue.  HENT: Negative for congestion and nosebleeds.   Respiratory: Negative for cough and shortness of breath.   Cardiovascular: Negative for claudication.       Per history of present illness  Gastrointestinal: Positive for heartburn. Negative for blood in stool and melena.  Genitourinary: Negative for hematuria.  Musculoskeletal: Negative.  Negative for joint pain.  Neurological: Negative for headaches.  Endo/Heme/Allergies: Does not bruise/bleed easily.  Psychiatric/Behavioral: The patient is not nervous/anxious.     Past Medical History:  Diagnosis Date  . Gout   . Hyperlipidemia   . Hypertension   . Prediabetes     Past Surgical History:  Procedure Laterality Date  . Cardiac Event Monitor  May-June 2017   Sinus rhythm with average heart rate 74.2 - no notable arrhythmias. Minimal PVCs. No PACs.  . CARPAL TUNNEL RELEASE Left 2007  . OTHER SURGICAL HISTORY  2007   Negative biopsy tonsil mass  . ROTATOR CUFF REPAIR Left 2000   Current Meds  Medication Sig  . allopurinol (ZYLOPRIM) 300 MG tablet TAKE 1 TABLET BY MOUTH  EVERY DAY TO PREVENT GOUT  . atenolol (TENORMIN) 100 MG tablet Take 1 tablet (100 mg total) by mouth daily.  Marland Kitchen. atenolol (TENORMIN) 100 MG tablet TAKE 1 TABLET BY MOUTH DAILY  . Cholecalciferol (VITAMIN D PO) Take 5,000 Int'l Units by mouth. Takes every other day  . clonazePAM (KLONOPIN) 2 MG tablet Take 1 tablet (2 mg total)  by mouth at bedtime as needed. for sleep  . gemfibrozil (LOPID) 600 MG tablet TAKE 1 TABLET BY MOUTH TWO  TIMES DAILY  . hydrochlorothiazide (HYDRODIURIL) 25 MG tablet TAKE 1 TABLET BY MOUTH  DAILY  . omeprazole (PRILOSEC) 40 MG capsule TAKE 1 CAPSULE DAILY FOR ACID INDIGESTION & ACID REFLUX  . sildenafil (REVATIO) 20 MG tablet 1/2 -1 tablet as needed once daily prior to erection    Allergies  Allergen Reactions  . Ace Inhibitors Cough  . Lipitor [Atorvastatin]     Elevates CPK and Aldolase     Social History   Social History  . Marital status: Married    Spouse name: N/A  . Number of children: N/A  . Years of education: N/A   Social History Main Topics  . Smoking status: Never Smoker  . Smokeless tobacco: Never Used  . Alcohol use 7.5 oz/week    15 drink(s) per week  . Drug use: No  . Sexual activity: Not Asked   Other Topics Concern  . None   Social History Narrative  . None   family history includes Hypertension in his mother; Seizures in his sister; Stroke in his father.   Wt Readings from Last 3 Encounters:  03/17/16 82.8 kg (182 lb 9.6 oz)  01/29/16 83.5 kg (184 lb)  10/28/15 80.3 kg (177 lb)    PHYSICAL EXAM BP 121/77   Pulse 68   Ht 6' (1.829 m)   Wt 82.8 kg (182 lb 9.6 oz)   BMI 24.77 kg/m  General appearance: alert, cooperative, appears stated age, no distress and Healthy-appearing. Well-nourished and well-groomed. HEENT: /AT, EOMI, MMM, anicteric sclera Lungs: CTA B, normal percussion bilaterally and non-labored Heart: regular rate and rhythm, S1 & S2 normal, no murmur, click, rub or gallop; nondisplaced PMI Abdomen: soft, non-tender; bowel sounds normal; no masses,  no organomegaly; no HJR Extremities: extremities normal, atraumatic, no cyanosis or edema  Pulses: 2+ and symmetric;  Neurologic/Psychiatric: Mental status: Alert, oriented, thought content appropriate    Adult ECG Report - not checked   Other studies Reviewed: Additional studies/ records that were reviewed today include:  Recent Labs:   Lab Results  Component Value Date   CREATININE 1.14 01/29/2016   BUN 18 01/29/2016   NA 137 01/29/2016   K 3.7 01/29/2016   CL 101 01/29/2016   CO2 24 01/29/2016    Lab Results  Component Value Date   CHOL 226 (H) 01/29/2016   HDL 64 01/29/2016   LDLCALC 133 (H) 01/29/2016   TRIG 145 01/29/2016   CHOLHDL 3.5 01/29/2016     ASSESSMENT / PLAN: Problem List Items Addressed This Visit    Essential hypertension (Chronic)     Well-controlled on current dose of beta blocker. He is actually taking HCTZ slightly differently - taking it every other day. Otherwise, his blood pressure looks good. Managed by his PCP      Hyperlipidemia (Chronic)    Managed by PCP. He is on gemfibrozil with relatively well-controlled triglycerides.      Paroxysmal dyspnea (Chronic)    He has a proximal exertional dyspnea, but really nothing that has bothered him very much. I talked about considering a stress test, and he did not seem convinced that this was a good idea. He did not do the echocardiogram that was previously ordered. This point since he is not overly concerned, we will not pursue further evaluation  I recommended he return on an as-needed basis if his symptoms worsen.  Palpitations    Likely related to PVCs noted on monitor. He is on high-dose beta blocker. I wonder if some of his exertional dyspnea and fatigue is related to high-dose beta blocker with some chronotropic incompetence. This would be one thing to consider the future if his symptoms recur.         Current medicines are reviewed at length with the patient today. (+/- concerns) None.  The following changes have been made: none  Patient Instructions  NO CHANGES TO MEDICATIONS   CALL OFFICE IF SHORTNESS OF BREATH OR SHORTNESS OF BREATH WITH ACTIVITY WORSENS.    Your physician recommends that you schedule a follow-up appointment as needed.    Studies Ordered:   No orders of the defined types were placed in this encounter.    Bryan Lemma, M.D., M.S. Interventional Cardiologist   Pager # [phone] Phone # [phone] [address] [place], [place] [address]

## 2016-03-17 NOTE — Patient Instructions (Addendum)
NO CHANGES TO MEDICATIONS   CALL OFFICE IF SHORTNESS OF BREATH OR SHORTNESS OF BREATH WITH ACTIVITY WORSENS.    Your physician recommends that you schedule a follow-up appointment as needed.

## 2016-03-19 ENCOUNTER — Encounter: Payer: Self-pay | Admitting: Cardiology

## 2016-03-19 NOTE — Assessment & Plan Note (Signed)
Managed by PCP. He is on gemfibrozil with relatively well-controlled triglycerides.

## 2016-03-19 NOTE — Assessment & Plan Note (Signed)
He has a proximal exertional dyspnea, but really nothing that has bothered him very much. I talked about considering a stress test, and he did not seem convinced that this was a good idea. He did not do the echocardiogram that was previously ordered. This point since he is not overly concerned, we will not pursue further evaluation  I recommended he return on an as-needed basis if his symptoms worsen.

## 2016-03-19 NOTE — Assessment & Plan Note (Signed)
Well-controlled on current dose of beta blocker. He is actually taking HCTZ slightly differently - taking it every other day. Otherwise, his blood pressure looks good. Managed by his PCP

## 2016-03-19 NOTE — Assessment & Plan Note (Signed)
Likely related to PVCs noted on monitor. He is on high-dose beta blocker. I wonder if some of his exertional dyspnea and fatigue is related to high-dose beta blocker with some chronotropic incompetence. This would be one thing to consider the future if his symptoms recur.

## 2016-04-28 ENCOUNTER — Other Ambulatory Visit: Payer: Self-pay | Admitting: Internal Medicine

## 2016-04-30 ENCOUNTER — Ambulatory Visit: Payer: Self-pay | Admitting: Internal Medicine

## 2016-05-14 ENCOUNTER — Ambulatory Visit: Payer: Self-pay | Admitting: Internal Medicine

## 2016-05-14 NOTE — Progress Notes (Signed)
NO SHOW

## 2016-05-14 NOTE — Patient Instructions (Signed)

## 2016-05-25 DIAGNOSIS — H1132 Conjunctival hemorrhage, left eye: Secondary | ICD-10-CM | POA: Diagnosis not present

## 2016-05-25 DIAGNOSIS — E119 Type 2 diabetes mellitus without complications: Secondary | ICD-10-CM | POA: Diagnosis not present

## 2016-05-25 DIAGNOSIS — H25813 Combined forms of age-related cataract, bilateral: Secondary | ICD-10-CM | POA: Diagnosis not present

## 2016-05-25 DIAGNOSIS — Z8669 Personal history of other diseases of the nervous system and sense organs: Secondary | ICD-10-CM | POA: Diagnosis not present

## 2016-05-26 ENCOUNTER — Ambulatory Visit (INDEPENDENT_AMBULATORY_CARE_PROVIDER_SITE_OTHER): Payer: Medicare Other | Admitting: Internal Medicine

## 2016-05-26 ENCOUNTER — Encounter: Payer: Self-pay | Admitting: Internal Medicine

## 2016-05-26 VITALS — BP 118/80 | HR 64 | Temp 97.7°F | Resp 16 | Ht 70.5 in | Wt 184.2 lb

## 2016-05-26 DIAGNOSIS — K219 Gastro-esophageal reflux disease without esophagitis: Secondary | ICD-10-CM

## 2016-05-26 DIAGNOSIS — R7303 Prediabetes: Secondary | ICD-10-CM | POA: Diagnosis not present

## 2016-05-26 DIAGNOSIS — Z9989 Dependence on other enabling machines and devices: Secondary | ICD-10-CM | POA: Diagnosis not present

## 2016-05-26 DIAGNOSIS — G4733 Obstructive sleep apnea (adult) (pediatric): Secondary | ICD-10-CM

## 2016-05-26 DIAGNOSIS — Z79899 Other long term (current) drug therapy: Secondary | ICD-10-CM

## 2016-05-26 DIAGNOSIS — E559 Vitamin D deficiency, unspecified: Secondary | ICD-10-CM

## 2016-05-26 DIAGNOSIS — M1000 Idiopathic gout, unspecified site: Secondary | ICD-10-CM | POA: Diagnosis not present

## 2016-05-26 DIAGNOSIS — I10 Essential (primary) hypertension: Secondary | ICD-10-CM

## 2016-05-26 DIAGNOSIS — E785 Hyperlipidemia, unspecified: Secondary | ICD-10-CM | POA: Diagnosis not present

## 2016-05-26 LAB — CBC WITH DIFFERENTIAL/PLATELET
BASOS ABS: 54 {cells}/uL (ref 0–200)
BASOS PCT: 1 %
Eosinophils Absolute: 594 cells/uL — ABNORMAL HIGH (ref 15–500)
Eosinophils Relative: 11 %
HEMATOCRIT: 45.5 % (ref 38.5–50.0)
Hemoglobin: 15.1 g/dL (ref 13.2–17.1)
LYMPHS PCT: 46 %
Lymphs Abs: 2484 cells/uL (ref 850–3900)
MCH: 30.8 pg (ref 27.0–33.0)
MCHC: 33.2 g/dL (ref 32.0–36.0)
MCV: 92.7 fL (ref 80.0–100.0)
MPV: 9.5 fL (ref 7.5–12.5)
Monocytes Absolute: 432 cells/uL (ref 200–950)
Monocytes Relative: 8 %
NEUTROS PCT: 34 %
Neutro Abs: 1836 cells/uL (ref 1500–7800)
Platelets: 253 10*3/uL (ref 140–400)
RBC: 4.91 MIL/uL (ref 4.20–5.80)
RDW: 13.9 % (ref 11.0–15.0)
WBC: 5.4 10*3/uL (ref 3.8–10.8)

## 2016-05-26 NOTE — Patient Instructions (Signed)

## 2016-05-26 NOTE — Progress Notes (Signed)
This very nice 82 y.o. MBM from [place] presents for 6 month follow up with Hypertension, Hyperlipidemia, Pre-Diabetes and Vitamin D Deficiency. Patient also has Gout controlled on Allopurinol. Like he has hx/o GERD controlled with Omeprazole. Other problem is OSA on CPAP with improved restorative sleep and less day time sleepiness.     Patient is treated for HTN (1998)  & BP has been controlled at home. Today's BP is at goal - 118/80. Patient has had no complaints of any cardiac type chest pain, palpitations, dyspnea/orthopnea/PND, dizziness, claudication, or dependent edema.     Patient is Statin Intolerant and his Hyperlipidemia is not controlled with diet & Gemfibrozil. Patient denies myalgias or other med SE's. Last Lipids were not at goal: Lab Results  Component Value Date   CHOL 226 (H) 01/29/2016   HDL 64 01/29/2016   LDLCALC 133 (H) 01/29/2016   TRIG 145 01/29/2016   CHOLHDL 3.5 01/29/2016      Also, the patient has history of PreDiabetes (A1c 6.3% in 2011) and has had no symptoms of reactive hypoglycemia, diabetic polys, paresthesias or visual blurring.  Last A1c was near goal: Lab Results  Component Value Date   HGBA1C 5.8 (H) 01/29/2016      Further, the patient also has history of Vitamin D Deficiency ("25" in 2008) and supplements vitamin D without any suspected side-effects. Last vitamin D was at goal:  Lab Results  Component Value Date   VD25OH 76 01/29/2016   Current Outpatient Prescriptions on File Prior to Visit  Medication Sig  . allopurinol  300 MG tablet TAKE 1 TAB  EVERY DAY TO PREVENT GOUT  . atenolol  100 MG tablet Take 1 tab daily.  Marland Kitchen. VITAMIN D PO  Alternates 4000 units and 6000 unit every other day.  . clonazePAM  2 MG tablet Take 1 tab at bedtime as needed  . gemfibrozil 600 MG tablet TAKE 1 TAB TWO  TIMES DAILY  . hctz 25 MG tablet TAKE 1 TAB  DAILY  . omeprazole  40 MG capsule TAKE 1 CAPSULE DAILY  . sildenafil  20 MG tablet 1/2 -1 tablet as  needed   Allergies  Allergen Reactions  . Ace Inhibitors Cough  . Lipitor [Atorvastatin] Elevates CPK and Aldolase   PMHx:   Past Medical History:  Diagnosis Date  . Gout   . Hyperlipidemia   . Hypertension   . Prediabetes    Immunization History  Administered Date(s) Administered  . Influenza, High Dose Seasonal PF 12/18/2013, 10/28/2015  . Influenza,inj,Quad PF,36+ Mos 03/07/2013  . Pneumococcal Conjugate-13 03/28/2014  . Pneumococcal-Unspecified 03/03/2007  . Td 03/02/2004  . Tdap 10/25/2014   Past Surgical History:  Procedure Laterality Date  . Cardiac Event Monitor  May-June 2017   Sinus rhythm with average heart rate 74.2 - no notable arrhythmias. Minimal PVCs. No PACs.  . CARPAL TUNNEL RELEASE Left 2007  . OTHER SURGICAL HISTORY  2007   Negative biopsy tonsil mass  . ROTATOR CUFF REPAIR Left 2000   FHx:    Reviewed / unchanged  SHx:    Reviewed / unchanged  Systems Review:  Constitutional: Denies fever, chills, wt changes, headaches, insomnia, fatigue, night sweats, change in appetite. Eyes: Denies redness, blurred vision, diplopia, discharge, itchy, watery eyes.  ENT: Denies discharge, congestion, post nasal drip, epistaxis, sore throat, earache, hearing loss, dental pain, tinnitus, vertigo, sinus pain, snoring.  CV: Denies chest pain, palpitations, irregular heartbeat, syncope, dyspnea, diaphoresis, orthopnea, PND, claudication or  edema. Respiratory: denies cough, dyspnea, DOE, pleurisy, hoarseness, laryngitis, wheezing.  Gastrointestinal: Denies dysphagia, odynophagia, heartburn, reflux, water brash, abdominal pain or cramps, nausea, vomiting, bloating, diarrhea, constipation, hematemesis, melena, hematochezia  or hemorrhoids. Genitourinary: Denies dysuria, frequency, urgency, nocturia, hesitancy, discharge, hematuria or flank pain. Musculoskeletal: Denies arthralgias, myalgias, stiffness, jt. swelling, pain, limping or strain/sprain.  Skin: Denies pruritus,  rash, hives, warts, acne, eczema or change in skin lesion(s). Neuro: No weakness, tremor, incoordination, spasms, paresthesia or pain. Psychiatric: Denies confusion, memory loss or sensory loss. Endo: Denies change in weight, skin or hair change.  Heme/Lymph: No excessive bleeding, bruising or enlarged lymph nodes.  Physical Exam  BP 118/80   Pulse 64   Temp 97.7 F (36.5 C)   Resp 16   Ht 5' 10.5" (1.791 m)   Wt 184 lb 3.2 oz (83.6 kg)   BMI 26.06 kg/m   Appears well nourished and in no distress.  Eyes: PERRLA, EOMs, conjunctiva no swelling or erythema. Sinuses: No frontal/maxillary tenderness ENT/Mouth: EAC's clear, TM's nl w/o erythema, bulging. Nares clear w/o erythema, swelling, exudates. Oropharynx clear without erythema or exudates. Oral hygiene is good. Tongue normal, non obstructing. Hearing intact.  Neck: Supple. Thyroid nl. Car 2+/2+ without bruits, nodes or JVD. Chest: Respirations nl with BS clear & equal w/o rales, rhonchi, wheezing or stridor.  Cor: Heart sounds normal w/ regular rate and rhythm without sig. murmurs, gallops, clicks, or rubs. Peripheral pulses normal and equal  without edema.  Abdomen: Soft & bowel sounds normal. Non-tender w/o guarding, rebound, hernias, masses, or organomegaly.  Lymphatics: Unremarkable.  Musculoskeletal: Full ROM all peripheral extremities, joint stability, 5/5 strength, and normal gait.  Skin: Warm, dry without exposed rashes, lesions or ecchymosis apparent.  Neuro: Cranial nerves intact, reflexes equal bilaterally. Sensory-motor testing grossly intact. Tendon reflexes grossly intact.  Pysch: Alert & oriented x 3.  Insight and judgement nl & appropriate. No ideations.  Assessment and Plan:  1. Essential hypertension  - Continue medication, monitor blood pressure at home.  - Continue DASH diet. Reminder to go to the ER if any CP,  SOB, nausea, dizziness, severe HA, changes vision/speech,  left arm numbness and tingling and  jaw pain.  - CBC with Differential/Platelet - BASIC METABOLIC PANEL WITH GFR - Magnesium - TSH  2. Hyperlipidemia, unspecified hyperlipidemia type  - Continue diet/meds, exercise,& lifestyle modifications.  - Continue monitor periodic cholesterol/liver & renal functions   - Hepatic function panel - Lipid panel - TSH  3. Prediabetes  - Continue diet, exercise, lifestyle modifications.  - Monitor appropriate labs.  - Hemoglobin A1c - Insulin, random  4. Vitamin D deficiency  - Continue supplementation.  - VITAMIN D 25 Hydroxy   5. Idiopathic gout  - Uric acid  6. Gastroesophageal reflux disease   7. OSA on CPAP   8. Medication management  - CBC with Differential/Platelet - BASIC METABOLIC PANEL WITH GFR - Hepatic function panel - Magnesium - Lipid panel - TSH - Hemoglobin A1c - Insulin, random - VITAMIN D 25 Hydroxy - Uric acid      Recommended regular exercise, BP monitoring, weight control, and discussed med and SE's. Recommended labs to assess and monitor clinical status. Further disposition pending results of labs. Over 30 minutes of exam, counseling, chart review was performed

## 2016-05-27 LAB — HEPATIC FUNCTION PANEL
ALBUMIN: 4.1 g/dL (ref 3.6–5.1)
ALK PHOS: 87 U/L (ref 40–115)
ALT: 12 U/L (ref 9–46)
AST: 23 U/L (ref 10–35)
Bilirubin, Direct: 0.1 mg/dL (ref <=0.2)
Indirect Bilirubin: 0.4 mg/dL (ref 0.2–1.2)
TOTAL PROTEIN: 7.7 g/dL (ref 6.1–8.1)
Total Bilirubin: 0.5 mg/dL (ref 0.2–1.2)

## 2016-05-27 LAB — LIPID PANEL
Cholesterol: 255 mg/dL — ABNORMAL HIGH (ref <200)
HDL: 72 mg/dL (ref >40)
LDL CALC: 153 mg/dL — AB (ref <100)
Total CHOL/HDL Ratio: 3.5 Ratio (ref <5.0)
Triglycerides: 148 mg/dL (ref <150)
VLDL: 30 mg/dL (ref <30)

## 2016-05-27 LAB — VITAMIN D 25 HYDROXY (VIT D DEFICIENCY, FRACTURES): Vit D, 25-Hydroxy: 62 ng/mL (ref 30–100)

## 2016-05-27 LAB — BASIC METABOLIC PANEL WITH GFR
BUN: 13 mg/dL (ref 7–25)
CO2: 28 mmol/L (ref 20–31)
CREATININE: 1.01 mg/dL (ref 0.70–1.11)
Calcium: 9.6 mg/dL (ref 8.6–10.3)
Chloride: 94 mmol/L — ABNORMAL LOW (ref 98–110)
GFR, Est African American: 81 mL/min (ref >=60)
GFR, Est Non African American: 70 mL/min (ref >=60)
GLUCOSE: 123 mg/dL — AB (ref 65–99)
POTASSIUM: 4.1 mmol/L (ref 3.5–5.3)
Sodium: 134 mmol/L — ABNORMAL LOW (ref 135–146)

## 2016-05-27 LAB — TSH: TSH: 1.96 m[IU]/L (ref 0.40–4.50)

## 2016-05-27 LAB — INSULIN, RANDOM: INSULIN: 42.5 u[IU]/mL — AB (ref 2.0–19.6)

## 2016-05-27 LAB — URIC ACID: Uric Acid, Serum: 4.8 mg/dL (ref 4.0–8.0)

## 2016-05-27 LAB — MAGNESIUM: Magnesium: 1.9 mg/dL (ref 1.5–2.5)

## 2016-05-27 LAB — HEMOGLOBIN A1C
HEMOGLOBIN A1C: 5.7 % — AB (ref <5.7)
MEAN PLASMA GLUCOSE: 117 mg/dL

## 2016-06-22 ENCOUNTER — Other Ambulatory Visit: Payer: Self-pay | Admitting: *Deleted

## 2016-06-22 DIAGNOSIS — N5201 Erectile dysfunction due to arterial insufficiency: Secondary | ICD-10-CM

## 2016-06-22 MED ORDER — SILDENAFIL CITRATE 20 MG PO TABS: ORAL_TABLET | ORAL | 3 refills | Status: DC

## 2016-07-14 DIAGNOSIS — G4733 Obstructive sleep apnea (adult) (pediatric): Secondary | ICD-10-CM | POA: Diagnosis not present

## 2016-08-03 ENCOUNTER — Other Ambulatory Visit: Payer: Self-pay | Admitting: Internal Medicine

## 2016-08-24 ENCOUNTER — Ambulatory Visit (INDEPENDENT_AMBULATORY_CARE_PROVIDER_SITE_OTHER): Payer: Medicare Other | Admitting: Physician Assistant

## 2016-08-24 ENCOUNTER — Encounter: Payer: Self-pay | Admitting: Physician Assistant

## 2016-08-24 VITALS — BP 120/82 | HR 72 | Temp 97.7°F | Resp 16 | Ht 70.5 in | Wt 182.4 lb

## 2016-08-24 DIAGNOSIS — I10 Essential (primary) hypertension: Secondary | ICD-10-CM | POA: Diagnosis not present

## 2016-08-24 DIAGNOSIS — K219 Gastro-esophageal reflux disease without esophagitis: Secondary | ICD-10-CM | POA: Diagnosis not present

## 2016-08-24 DIAGNOSIS — E782 Mixed hyperlipidemia: Secondary | ICD-10-CM | POA: Diagnosis not present

## 2016-08-24 DIAGNOSIS — R7303 Prediabetes: Secondary | ICD-10-CM | POA: Diagnosis not present

## 2016-08-24 DIAGNOSIS — M1000 Idiopathic gout, unspecified site: Secondary | ICD-10-CM

## 2016-08-24 DIAGNOSIS — Z860100 Personal history of colon polyps, unspecified: Secondary | ICD-10-CM

## 2016-08-24 DIAGNOSIS — Z0001 Encounter for general adult medical examination with abnormal findings: Secondary | ICD-10-CM

## 2016-08-24 DIAGNOSIS — Z79899 Other long term (current) drug therapy: Secondary | ICD-10-CM

## 2016-08-24 DIAGNOSIS — E559 Vitamin D deficiency, unspecified: Secondary | ICD-10-CM | POA: Diagnosis not present

## 2016-08-24 DIAGNOSIS — Z23 Encounter for immunization: Secondary | ICD-10-CM | POA: Diagnosis not present

## 2016-08-24 DIAGNOSIS — Z0000 Encounter for general adult medical examination without abnormal findings: Secondary | ICD-10-CM

## 2016-08-24 DIAGNOSIS — G4733 Obstructive sleep apnea (adult) (pediatric): Secondary | ICD-10-CM

## 2016-08-24 DIAGNOSIS — Z9989 Dependence on other enabling machines and devices: Secondary | ICD-10-CM

## 2016-08-24 LAB — CBC WITH DIFFERENTIAL/PLATELET
BASOS ABS: 51 {cells}/uL (ref 0–200)
Basophils Relative: 1 %
Eosinophils Absolute: 714 cells/uL — ABNORMAL HIGH (ref 15–500)
Eosinophils Relative: 14 %
HEMATOCRIT: 45.2 % (ref 38.5–50.0)
HEMOGLOBIN: 14.7 g/dL (ref 13.2–17.1)
Lymphocytes Relative: 54 %
Lymphs Abs: 2754 cells/uL (ref 850–3900)
MCH: 30.2 pg (ref 27.0–33.0)
MCHC: 32.5 g/dL (ref 32.0–36.0)
MCV: 93 fL (ref 80.0–100.0)
MONO ABS: 459 {cells}/uL (ref 200–950)
MPV: 9.9 fL (ref 7.5–12.5)
Monocytes Relative: 9 %
Neutro Abs: 1122 cells/uL — ABNORMAL LOW (ref 1500–7800)
Neutrophils Relative %: 22 %
Platelets: 249 10*3/uL (ref 140–400)
RBC: 4.86 MIL/uL (ref 4.20–5.80)
RDW: 14.4 % (ref 11.0–15.0)
WBC: 5.1 10*3/uL (ref 3.8–10.8)

## 2016-08-24 NOTE — Progress Notes (Signed)
MEDICARE ANNUAL WELLNESS VISIT AND FOLLOW UP Assessment:    Essential hypertension - continue medications, DASH diet, exercise and monitor at home. Call if greater than 130/80.  -     CBC with Differential/Platelet -     BASIC METABOLIC PANEL WITH GFR -     Hepatic function panel -     TSH  Mixed hyperlipidemia -continue medications, check lipids, decrease fatty foods, increase activity.  -     Lipid panel  Prediabetes Discussed general issues about diabetes pathophysiology and management., Educational material distributed., Suggested low cholesterol diet., Encouraged aerobic exercise., Discussed foot care., Reminded to get yearly retinal exam. -     Hemoglobin A1c  OSA on CPAP Continue CPAP, helps with sleep  Gastroesophageal reflux disease, esophagitis presence not specified Continue PPI/H2 blocker, diet discussed  Idiopathic gout, unspecified chronicity, unspecified site Gout- recheck Uric acid as needed, Diet discussed, continue medications. -     Uric acid  History of colonic polyps UTD  Medication management -     Magnesium  Vitamin D deficiency Continue supplement  Medicare annual wellness visit, subsequent    Future Appointments Date Time Provider Department Center  12/10/2016 2:00 PM Lucky CowboyMcKeown, William, MD [facility] None     Plan:   During the course of the visit the patient was educated and counseled about appropriate screening and preventive services including:    Pneumococcal vaccine   Influenza vaccine  Td vaccine  Screening electrocardiogram  Colorectal cancer screening  Diabetes screening  Glaucoma screening  Nutrition counseling    Subjective:  Jeremy JewsFitzroy Job is a AA 82 y.o. male who presents for Medicare Annual Wellness Visit and 3 month follow up for HTN, hyperlipidemia, prediabetes, and vitamin D Def.   His blood pressure has been controlled at home, today their BP is BP: 120/82 He does workout. He denies chest pain, ,  dizziness.  He is on cholesterol medication, lopid 600 once daily due to intolerance of statins and denies myalgias. His cholesterol is not at goal. The cholesterol last visit was:   Lab Results  Component Value Date   CHOL 255 (H) 05/26/2016   HDL 72 05/26/2016   LDLCALC 153 (H) 05/26/2016   TRIG 148 05/26/2016   CHOLHDL 3.5 05/26/2016  He has been working on diet and exercise for prediabetes, and denies paresthesia of the feet, polydipsia, polyuria and visual disturbances. Last A1C in the office was:  Lab Results  Component Value Date   HGBA1C 5.7 (H) 05/26/2016  Patient is on Vitamin D supplement.   Lab Results  Component Value Date   VD25OH 5062 05/26/2016   He has ED and uses viagra PRN. Patient is on allopurinol for gout and does not report a recent flare.  He is on klonopin as needed for insomnia.  BMI is Body mass index is 25.8 kg/m., he is working on diet and exercise. Wt Readings from Last 3 Encounters:  08/24/16 182 lb 6.4 oz (82.7 kg)  05/26/16 184 lb 3.2 oz (83.6 kg)  03/17/16 182 lb 9.6 oz (82.8 kg)     Names of Other Physician/Practitioners you currently use: 1. [facility] here for primary care Patient Care Team: Lucky CowboyWilliam McKeown, MD as PCP - General (Internal Medicine) Lamar Benesharles R Epes, MD as Consulting Physician (Ophthalmology)-  May 2018 last visit Drucilla SchmidtJames P Aplington, MD as Consulting Physician (Orthopedic Surgery) Louis Meckelobert D Kaplan, MD as Consulting Physician (Gastroenterology)  Medication Review: Current Outpatient Prescriptions on File Prior to Visit  Medication Sig Dispense Refill  . allopurinol (ZYLOPRIM) 300 MG tablet TAKE 1 TABLET BY MOUTH  EVERY DAY TO PREVENT GOUT 90 tablet 1  . atenolol (TENORMIN) 100 MG tablet TAKE 1 TABLET BY MOUTH  DAILY 90 tablet 1  . Cholecalciferol (VITAMIN D PO) Take 2,000 Int'l Units by mouth. Alternates 4000 units and 6000 unit every other day.    . clonazePAM (KLONOPIN) 2 MG tablet Take 1  tablet (2 mg total) by mouth at bedtime as needed. for sleep 90 tablet 1  . gemfibrozil (LOPID) 600 MG tablet TAKE 1 TABLET BY MOUTH TWO  TIMES DAILY 180 tablet 1  . hydrochlorothiazide (HYDRODIURIL) 25 MG tablet TAKE 1 TABLET BY MOUTH  DAILY 90 tablet 1  . omeprazole (PRILOSEC) 40 MG capsule TAKE 1 CAPSULE DAILY FOR  ACID INDIGESTION AND ACID  REFLUX 90 capsule 1  . sildenafil (REVATIO) 20 MG tablet Take 2 to 5 tablets as needed once daily prior to erection 90 tablet 3   No current facility-administered medications on file prior to visit.     Current Problems (verified) Patient Active Problem List   Diagnosis Date Noted  . Encounter for general adult medical examination with abnormal findings 10/28/2015  . Palpitations 07/31/2015  . OSA on CPAP 04/19/2015  . Medicare annual wellness visit, subsequent 09/24/2014  . Vitamin D deficiency 09/14/2013  . Medication management 09/14/2013  . History of colonic polyps 08/02/2013  . GERD  08/02/2013  . Hyperlipidemia   . Essential hypertension   . Gout   . Prediabetes     Screening Tests Immunization History  Administered Date(s) Administered  . Influenza, High Dose Seasonal PF 12/18/2013, 10/28/2015  . Influenza,inj,Quad PF,36+ Mos 03/07/2013  . Pneumococcal Conjugate-13 03/28/2014  . Pneumococcal-Unspecified 03/03/2007  . Td 03/02/2004  . Tdap 10/25/2014    Preventative care: Last colonoscopy: 10/13/07 DUE 2019, polyps, diverticulosis EGD: 2015 Dr. Arlyce Dice CXR: 2007 CT head 2010 CT lumbar 2009  Prior vaccinations: TD or Tdap: 2016 Influenza: 2017 Pneumococcal: 2009 Prevnar 13: 2016 Shingles/Zostavax: getting today  History reviewed: allergies, current medications, past family history, past medical history, past social history, past surgical history and problem list  Allergies Allergies  Allergen Reactions  . Ace Inhibitors Cough  . Lipitor [Atorvastatin]     Elevates CPK and Aldolase    SURGICAL HISTORY He  has a  past surgical history that includes Carpal tunnel release (Left, 2007); Rotator cuff repair (Left, 2000); Other surgical history (2007); and Cardiac Event Monitor (May-June 2017). FAMILY HISTORY His family history includes Hypertension in his mother; Seizures in his sister; Stroke in his father. SOCIAL HISTORY He  reports that he has never smoked. He has never used smokeless tobacco. He reports that he drinks about 7.5 oz of alcohol per week . He reports that he does not use drugs.  MEDICARE WELLNESS OBJECTIVES: Physical activity: Current Exercise Habits: The patient does not participate in regular exercise at present Cardiac risk factors: Cardiac Risk Factors include: advanced age (>59men, >22 women);dyslipidemia;hypertension;sedentary lifestyle;male gender Depression/mood screen:   Depression screen [facility] 2/9 08/24/2016  Decreased Interest 0  Down, Depressed, Hopeless 0  PHQ - 2 Score 0    ADLs:  In your present state of health, do you have any difficulty performing the following activities: 08/24/2016 05/26/2016  Hearing? N N  Vision? N N  Difficulty concentrating or making decisions? N N  Walking or climbing stairs? N N  Dressing or bathing? N N  Doing errands, shopping? N N  Preparing Food and eating ? N -  Using the Toilet? N -  In the past six months, have you accidently leaked urine? N -  Do you have problems with loss of bowel control? N -  Managing your Medications? N -  Managing your Finances? N -  Housekeeping or managing your Housekeeping? N -  Some recent data might be hidden     Cognitive Testing  Alert? Yes  Normal Appearance?Yes  Oriented to person? Yes  Place? Yes   Time? Yes  Recall of three objects?  Yes  Can perform simple calculations? Yes  Displays appropriate judgment?Yes  Can read the correct time from a watch face?Yes  EOL planning: Does Patient Have a Medical Advance Directive?: No Would patient like information on creating a medical advance  directive?: Yes (ED - Information included in AVS)   Objective:   Blood pressure 120/82, pulse 72, temperature 97.7 F (36.5 C), resp. rate 16, height 5' 10.5" (1.791 m), weight 182 lb 6.4 oz (82.7 kg), SpO2 95 %. Body mass index is 25.8 kg/m.  General appearance: alert, no distress, WD/WN, male HEENT: normocephalic, sclerae anicteric, TMs pearly, nares patent, no discharge or erythema, pharynx normal Oral cavity: MMM, no lesions Neck: supple, no lymphadenopathy, no thyromegaly, no masses Heart: RRR, normal S1, S2, no murmurs Lungs: CTA bilaterally, no wheezes, rhonchi, or rales Abdomen: +bs, soft, non tender, non distended, no masses, no hepatomegaly, no splenomegaly Musculoskeletal: nontender, no swelling, no obvious deformity Extremities: no edema, no cyanosis, no clubbing Pulses: 2+ symmetric, upper and lower extremities, normal cap refill Neurological: very slight right mouth twitch, no cog wheeling, alert, oriented x 3, CN2-12 intact, strength normal upper extremities and lower extremities, sensation normal throughout, DTRs 2+ throughout, no cerebellar signs, gait normal Psychiatric: normal affect, behavior normal, pleasant   Medicare Attestation I have personally reviewed: The patient's medical and social history Their use of alcohol, tobacco or illicit drugs Their current medications and supplements The patient's functional ability including ADLs,fall risks, home safety risks, cognitive, and hearing and visual impairment Diet and physical activities Evidence for depression or mood disorders  The patient's weight, height, BMI, and visual acuity have been recorded in the chart.  I have made referrals, counseling, and provided education to the patient based on review of the above and I have provided the patient with a written personalized care plan for preventive services.     Quentin Mulling, PA-C   08/24/2016

## 2016-08-25 ENCOUNTER — Encounter: Payer: Self-pay | Admitting: Physician Assistant

## 2016-08-25 LAB — LIPID PANEL
CHOL/HDL RATIO: 3.5 ratio (ref <5.0)
CHOLESTEROL: 229 mg/dL — AB (ref <200)
HDL: 65 mg/dL (ref >40)
LDL CALC: 141 mg/dL — AB (ref <100)
Triglycerides: 115 mg/dL (ref <150)
VLDL: 23 mg/dL (ref <30)

## 2016-08-25 LAB — BASIC METABOLIC PANEL WITH GFR
BUN: 17 mg/dL (ref 7–25)
CALCIUM: 9.4 mg/dL (ref 8.6–10.3)
CHLORIDE: 102 mmol/L (ref 98–110)
CO2: 20 mmol/L (ref 20–31)
Creat: 1.05 mg/dL (ref 0.70–1.11)
GFR, EST NON AFRICAN AMERICAN: 67 mL/min (ref >=60)
GFR, Est African American: 77 mL/min (ref >=60)
GLUCOSE: 96 mg/dL (ref 65–99)
Potassium: 3.9 mmol/L (ref 3.5–5.3)
Sodium: 138 mmol/L (ref 135–146)

## 2016-08-25 LAB — URIC ACID: Uric Acid, Serum: 4.7 mg/dL (ref 4.0–8.0)

## 2016-08-25 LAB — HEMOGLOBIN A1C
HEMOGLOBIN A1C: 5.8 % — AB (ref <5.7)
MEAN PLASMA GLUCOSE: 120 mg/dL

## 2016-08-25 LAB — HEPATIC FUNCTION PANEL
ALK PHOS: 82 U/L (ref 40–115)
ALT: 11 U/L (ref 9–46)
AST: 26 U/L (ref 10–35)
Albumin: 4.3 g/dL (ref 3.6–5.1)
BILIRUBIN INDIRECT: 0.2 mg/dL (ref 0.2–1.2)
Bilirubin, Direct: 0.1 mg/dL (ref <=0.2)
Total Bilirubin: 0.3 mg/dL (ref 0.2–1.2)
Total Protein: 7.8 g/dL (ref 6.1–8.1)

## 2016-08-25 LAB — MAGNESIUM: Magnesium: 2 mg/dL (ref 1.5–2.5)

## 2016-08-25 LAB — TSH: TSH: 3.71 mIU/L (ref 0.40–4.50)

## 2016-08-26 DIAGNOSIS — M1000 Idiopathic gout, unspecified site: Secondary | ICD-10-CM | POA: Diagnosis not present

## 2016-08-26 DIAGNOSIS — Z23 Encounter for immunization: Secondary | ICD-10-CM | POA: Diagnosis not present

## 2016-08-26 DIAGNOSIS — I10 Essential (primary) hypertension: Secondary | ICD-10-CM | POA: Diagnosis not present

## 2016-08-26 NOTE — Addendum Note (Signed)
Addended by: Rodney BoozeUFF, Bodey Frizell D on: 08/26/2016 02:19 PM   Modules accepted: Orders

## 2016-09-17 ENCOUNTER — Encounter: Payer: Self-pay | Admitting: Physician Assistant

## 2016-09-17 DIAGNOSIS — H04129 Dry eye syndrome of unspecified lacrimal gland: Secondary | ICD-10-CM | POA: Diagnosis not present

## 2016-09-23 ENCOUNTER — Other Ambulatory Visit: Payer: Self-pay | Admitting: Physician Assistant

## 2016-09-23 MED ORDER — CLONAZEPAM 2 MG PO TABS
2.0000 mg | ORAL_TABLET | Freq: Every evening | ORAL | 1 refills | Status: DC | PRN
Start: 2016-09-23 — End: 2017-05-26

## 2016-11-11 ENCOUNTER — Other Ambulatory Visit: Payer: Self-pay | Admitting: Internal Medicine

## 2016-12-10 ENCOUNTER — Ambulatory Visit (INDEPENDENT_AMBULATORY_CARE_PROVIDER_SITE_OTHER): Payer: Medicare Other | Admitting: Internal Medicine

## 2016-12-10 VITALS — BP 126/82 | HR 60 | Temp 97.6°F | Resp 18 | Ht 70.5 in | Wt 186.0 lb

## 2016-12-10 DIAGNOSIS — E559 Vitamin D deficiency, unspecified: Secondary | ICD-10-CM

## 2016-12-10 DIAGNOSIS — Z79899 Other long term (current) drug therapy: Secondary | ICD-10-CM | POA: Diagnosis not present

## 2016-12-10 DIAGNOSIS — M1000 Idiopathic gout, unspecified site: Secondary | ICD-10-CM

## 2016-12-10 DIAGNOSIS — Z23 Encounter for immunization: Secondary | ICD-10-CM

## 2016-12-10 DIAGNOSIS — Z1212 Encounter for screening for malignant neoplasm of rectum: Secondary | ICD-10-CM

## 2016-12-10 DIAGNOSIS — Z9989 Dependence on other enabling machines and devices: Secondary | ICD-10-CM

## 2016-12-10 DIAGNOSIS — Z125 Encounter for screening for malignant neoplasm of prostate: Secondary | ICD-10-CM

## 2016-12-10 DIAGNOSIS — G4733 Obstructive sleep apnea (adult) (pediatric): Secondary | ICD-10-CM

## 2016-12-10 DIAGNOSIS — E782 Mixed hyperlipidemia: Secondary | ICD-10-CM

## 2016-12-10 DIAGNOSIS — I10 Essential (primary) hypertension: Secondary | ICD-10-CM

## 2016-12-10 DIAGNOSIS — Z136 Encounter for screening for cardiovascular disorders: Secondary | ICD-10-CM

## 2016-12-10 DIAGNOSIS — Z1211 Encounter for screening for malignant neoplasm of colon: Secondary | ICD-10-CM

## 2016-12-10 DIAGNOSIS — Z0000 Encounter for general adult medical examination without abnormal findings: Secondary | ICD-10-CM | POA: Diagnosis not present

## 2016-12-10 DIAGNOSIS — Z0001 Encounter for general adult medical examination with abnormal findings: Secondary | ICD-10-CM

## 2016-12-10 DIAGNOSIS — R7303 Prediabetes: Secondary | ICD-10-CM | POA: Diagnosis not present

## 2016-12-10 DIAGNOSIS — K219 Gastro-esophageal reflux disease without esophagitis: Secondary | ICD-10-CM

## 2016-12-10 NOTE — Progress Notes (Signed)
[facility]   [name], M.D.     Dyanne Carrel. Steffanie Dunn, P.A.-C Judd Gaudier, DNP [facility]                [address]                [place], [place]. [address] Telephone [phone] Telefax [phone] Annual  Screening/Preventative Visit  & Comprehensive Evaluation & Examination     This very nice 82 y.o. MBM from [place]  presents for a Screening/Preventative Visit & comprehensive evaluation and management of multiple medical co-morbidities.  Patient has been followed for HTN, Prediabetes, Hyperlipidemia and Vitamin D Deficiency. Patient is on CPAP for OSA with improved restorative sleep. Patient's Gout is quiescent on Allopurinol and his GERD likewise is asymptomatic on Prilosec.      HTN predates since 1998. Patient's BP has been controlled at home.  Today's BP is at goal - 126/82. Patient denies any cardiac symptoms as chest pain, palpitations, shortness of breath, dizziness or ankle swelling.     Patient's hyperlipidemia is not controlled with diet & Lopid as he has hx/o Statin Intolerance. Patient denies myalgias or other medication SE's. Last lipids were not at goal  Lab Results  Component Value Date   CHOL 229 (H) 08/24/2016   HDL 65 08/24/2016   LDLCALC 141 (H) 08/24/2016   TRIG 115 08/24/2016   CHOLHDL 3.5 08/24/2016      Patient has prediabetes (A1c 6.3% / 2011)  and patient denies reactive hypoglycemic symptoms, visual blurring, diabetic polys or paresthesias. Last A1c was near goal: Lab Results  Component Value Date   HGBA1C 5.8 (H) 08/24/2016       Finally, patient has history of Vitamin D Deficiency ("25" / 2008)   and last vitamin D was at goal: Lab Results  Component Value Date   VD25OH 62 05/26/2016   Current Outpatient Prescriptions on File Prior to Visit  Medication Sig  . allopurinol (ZYLOPRIM) 300 MG tablet TAKE 1 TABLET BY MOUTH  EVERY DAY TO PREVENT GOUT  . atenolol  (TENORMIN) 100 MG tablet TAKE 1 TABLET BY MOUTH  DAILY  . Cholecalciferol (VITAMIN D PO) Take 2,000 Int'l Units by mouth. Alternates 4000 units and 6000 unit every other day.  . clonazePAM (KLONOPIN) 2 MG tablet Take 1 tablet (2 mg total) by mouth at bedtime as needed. for sleep  . gemfibrozil (LOPID) 600 MG tablet TAKE 1 TABLET BY MOUTH TWO  TIMES DAILY  . hydrochlorothiazide (HYDRODIURIL) 25 MG tablet TAKE 1 TABLET BY MOUTH  DAILY  . omeprazole (PRILOSEC) 40 MG capsule TAKE 1 CAPSULE DAILY FOR  ACID INDIGESTION AND ACID  REFLUX  . sildenafil (REVATIO) 20 MG tablet Take 2 to 5 tablets as needed once daily prior to erection   No current facility-administered medications on file prior to visit.    Allergies  Allergen Reactions  . Ace Inhibitors Cough  . Lipitor [Atorvastatin]     Elevates CPK and Aldolase   Past Medical History:  Diagnosis Date  . Gout   . Hyperlipidemia   . Hypertension   . Prediabetes    Health Maintenance  Topic Date Due  . INFLUENZA VACCINE  09/30/2016  . TETANUS/TDAP  10/24/2024  . PNA vac Low Risk Adult  Completed   Immunization History  Administered Date(s) Administered  . Influenza, High Dose Seasonal PF 12/18/2013, 10/28/2015  . Influenza,inj,Quad PF,6+ Mos 03/07/2013  . Pneumococcal Conjugate-13 03/28/2014  .  Pneumococcal-Unspecified 03/03/2007  . Td 03/02/2004  . Tdap 10/25/2014  . Zoster 08/26/2016   Past Surgical History:  Procedure Laterality Date  . Cardiac Event Monitor  May-June 2017   Sinus rhythm with average heart rate 74.2 - no notable arrhythmias. Minimal PVCs. No PACs.  . CARPAL TUNNEL RELEASE Left 2007  . OTHER SURGICAL HISTORY  2007   Negative biopsy tonsil mass  . ROTATOR CUFF REPAIR Left 2000   Family History  Problem Relation Age of Onset  . Hypertension Mother   . Stroke Father   . Seizures Sister    Social History   Social History  . Marital status: Married    Spouse name: N/A  . Number of children: N/A  . Years  of education: N/A   Occupational History  . Not on file.   Social History Main Topics  . Smoking status: Never Smoker  . Smokeless tobacco: Never Used  . Alcohol use 7.5 oz/week    15 drink(s) per week  . Drug use: No  . Sexual activity: Not on file   Other Topics Concern  . Not on file   Social History Narrative  . No narrative on file    ROS Constitutional: Denies fever, chills, weight loss/gain, headaches, insomnia,  night sweats or change in appetite. Does c/o fatigue. Eyes: Denies redness, blurred vision, diplopia, discharge, itchy or watery eyes.  ENT: Denies discharge, congestion, post nasal drip, epistaxis, sore throat, earache, hearing loss, dental pain, Tinnitus, Vertigo, Sinus pain or snoring.  Cardio: Denies chest pain, palpitations, irregular heartbeat, syncope, dyspnea, diaphoresis, orthopnea, PND, claudication or edema Respiratory: denies cough, dyspnea, DOE, pleurisy, hoarseness, laryngitis or wheezing.  Gastrointestinal: Denies dysphagia, heartburn, reflux, water brash, pain, cramps, nausea, vomiting, bloating, diarrhea, constipation, hematemesis, melena, hematochezia, jaundice or hemorrhoids Genitourinary: Denies dysuria, frequency, urgency, nocturia, hesitancy, discharge, hematuria or flank pain Musculoskeletal: Denies arthralgia, myalgia, stiffness, Jt. Swelling, pain, limp or strain/sprain. Denies Falls. Skin: Denies puritis, rash, hives, warts, acne, eczema or change in skin lesion Neuro: No weakness, tremor, incoordination, spasms, paresthesia or pain Psychiatric: Denies confusion, memory loss or sensory loss. Denies Depression. Endocrine: Denies change in weight, skin, hair change, nocturia, and paresthesia, diabetic polys, visual blurring or hyper / hypo glycemic episodes.  Heme/Lymph: No excessive bleeding, bruising or enlarged lymph nodes.  Physical Exam  BP 126/82   Pulse 60   Temp 97.6 F (36.4 C)   Resp 18   Ht 5' 10.5" (1.791 m)   Wt 186 lb  (84.4 kg)   BMI 26.31 kg/m   General Appearance: Well nourished and well groomed and in no apparent distress.  Eyes: PERRLA, EOMs, conjunctiva no swelling or erythema, normal fundi and vessels. Sinuses: No frontal/maxillary tenderness ENT/Mouth: EACs patent / TMs  nl. Nares clear without erythema, swelling, mucoid exudates. Oral hygiene is good. No erythema, swelling, or exudate. Tongue normal, non-obstructing. Tonsils not swollen or erythematous. Hearing normal.  Neck: Supple, thyroid normal. No bruits, nodes or JVD. Respiratory: Respiratory effort normal.  BS equal and clear bilateral without rales, rhonci, wheezing or stridor. Cardio: Heart sounds are normal with regular rate and rhythm and no murmurs, rubs or gallops. Peripheral pulses are normal and equal bilaterally without edema. No aortic or femoral bruits. Chest: symmetric with normal excursions and percussion.  Abdomen: Soft, with Nl bowel sounds. Nontender, no guarding, rebound, hernias, masses, or organomegaly.  Lymphatics: Non tender without lymphadenopathy.  Genitourinary: No hernias.Testes nl. DRE - prostate nl for age - smooth & firm w/o  nodules. Musculoskeletal: Full ROM all peripheral extremities, joint stability, 5/5 strength, and normal gait. Skin: Warm and dry without rashes, lesions, cyanosis, clubbing or  ecchymosis.  Neuro: Cranial nerves intact, reflexes equal bilaterally. Normal muscle tone, no cerebellar symptoms. Sensation intact.  Pysch: Alert and oriented X 3 with normal affect, insight and judgment appropriate.   Assessment and Plan  1. Annual Preventative/Screening Exam   2. Essential hypertension  - EKG 12-Lead - [place], RETROPERITNL ABD,  LTD - Urinalysis, Routine w reflex microscopic - Microalbumin / creatinine urine ratio - CBC with Differential/Platelet - BASIC METABOLIC PANEL WITH GFR - Magnesium - TSH  3. Mixed hyperlipidemia  - EKG 12-Lead - [place], RETROPERITNL ABD,  LTD - Hepatic function  panel - Lipid panel - TSH  4. Prediabetes  - EKG 12-Lead - [place], RETROPERITNL ABD,  LTD - Hemoglobin A1c - Insulin, random  5. Vitamin D deficiency  - VITAMIN D 25 Hydroxy   6. Idiopathic gout, unspecified chronicity, unspecified site  - Uric acid  7. Gastroesophageal reflux disease, esophagitis presence not specified  - POC Hemoccult Bld/Stl   8. OSA on CPAP   9. Screening for colorectal cancer  - POC Hemoccult Bld/Stl   10. Prostate cancer screening  - PSA  11. Screening for ischemic heart disease  - EKG 12-Lead  12. Screening for AAA (aortic abdominal aneurysm)  - [place], RETROPERITNL ABD,  LTD  13. Medication management  - Urinalysis, Routine w reflex microscopic - Microalbumin / creatinine urine ratio - Uric acid - CBC with Differential/Platelet - BASIC METABOLIC PANEL WITH GFR - Hepatic function panel - Magnesium - Lipid panel - TSH - Hemoglobin A1c - Insulin, random - VITAMIN D 25 Hydroxy   14. Need for immunization against influenza  - Flu vaccine HIGH DOSE PF (Fluzone High dose)        Patient was counseled in prudent diet, weight control to achieve/maintain BMI less than 25, BP monitoring, regular exercise and medications as discussed.  Discussed med effects and SE's. Routine screening labs and tests as requested with regular follow-up as recommended. Over 40 minutes of exam, counseling, chart review and high complex critical decision making was performed

## 2016-12-10 NOTE — Patient Instructions (Signed)

## 2016-12-11 ENCOUNTER — Encounter: Payer: Self-pay | Admitting: Internal Medicine

## 2016-12-11 LAB — BASIC METABOLIC PANEL WITH GFR
BUN: 16 mg/dL (ref 7–25)
CALCIUM: 9.7 mg/dL (ref 8.6–10.3)
CO2: 27 mmol/L (ref 20–32)
CREATININE: 1.05 mg/dL (ref 0.70–1.11)
Chloride: 97 mmol/L — ABNORMAL LOW (ref 98–110)
GFR, EST NON AFRICAN AMERICAN: 67 mL/min/{1.73_m2} (ref >=60)
GFR, Est African American: 77 mL/min/{1.73_m2} (ref >=60)
GLUCOSE: 94 mg/dL (ref 65–99)
Potassium: 4.5 mmol/L (ref 3.5–5.3)
SODIUM: 135 mmol/L (ref 135–146)

## 2016-12-11 LAB — URINALYSIS, ROUTINE W REFLEX MICROSCOPIC
BACTERIA UA: NONE SEEN /HPF
Bilirubin Urine: NEGATIVE
Glucose, UA: NEGATIVE
Hyaline Cast: NONE SEEN /LPF
KETONES UR: NEGATIVE
LEUKOCYTES UA: NEGATIVE
Nitrite: NEGATIVE
PROTEIN: NEGATIVE
RBC / HPF: NONE SEEN /HPF (ref 0–2)
SPECIFIC GRAVITY, URINE: 1.009 (ref 1.001–1.03)
SQUAMOUS EPITHELIAL / LPF: NONE SEEN /HPF (ref <=5)
WBC, UA: NONE SEEN /HPF (ref 0–5)
pH: 6.5 (ref 5.0–8.0)

## 2016-12-11 LAB — HEPATIC FUNCTION PANEL
AG RATIO: 1.2 (calc) (ref 1.0–2.5)
ALBUMIN MSPROF: 4.4 g/dL (ref 3.6–5.1)
ALKALINE PHOSPHATASE (APISO): 84 U/L (ref 40–115)
ALT: 11 U/L (ref 9–46)
AST: 25 U/L (ref 10–35)
BILIRUBIN TOTAL: 0.4 mg/dL (ref 0.2–1.2)
Bilirubin, Direct: 0.1 mg/dL (ref 0.0–0.2)
GLOBULIN: 3.7 g/dL (ref 1.9–3.7)
Indirect Bilirubin: 0.3 mg/dL (calc) (ref 0.2–1.2)
TOTAL PROTEIN: 8.1 g/dL (ref 6.1–8.1)

## 2016-12-11 LAB — HEMOGLOBIN A1C
EAG (MMOL/L): 6.5 (calc)
HEMOGLOBIN A1C: 5.7 %{Hb} — AB (ref <5.7)
MEAN PLASMA GLUCOSE: 117 (calc)

## 2016-12-11 LAB — MAGNESIUM: Magnesium: 1.9 mg/dL (ref 1.5–2.5)

## 2016-12-11 LAB — LIPID PANEL
CHOL/HDL RATIO: 2.6 (calc) (ref <5.0)
CHOLESTEROL: 245 mg/dL — AB (ref <200)
HDL: 95 mg/dL (ref >40)
LDL Cholesterol (Calc): 133 mg/dL (calc) — ABNORMAL HIGH
NON-HDL CHOLESTEROL (CALC): 150 mg/dL — AB (ref <130)
TRIGLYCERIDES: 77 mg/dL (ref <150)

## 2016-12-11 LAB — CBC WITH DIFFERENTIAL/PLATELET
BASOS ABS: 92 {cells}/uL (ref 0–200)
BASOS PCT: 1.4 %
EOS PCT: 10 %
Eosinophils Absolute: 660 cells/uL — ABNORMAL HIGH (ref 15–500)
HEMATOCRIT: 45.7 % (ref 38.5–50.0)
HEMOGLOBIN: 15.3 g/dL (ref 13.2–17.1)
LYMPHS ABS: 2554 {cells}/uL (ref 850–3900)
MCH: 30.2 pg (ref 27.0–33.0)
MCHC: 33.5 g/dL (ref 32.0–36.0)
MCV: 90.3 fL (ref 80.0–100.0)
MONOS PCT: 10.4 %
MPV: 10 fL (ref 7.5–12.5)
NEUTROS ABS: 2607 {cells}/uL (ref 1500–7800)
Neutrophils Relative %: 39.5 %
Platelets: 266 10*3/uL (ref 140–400)
RBC: 5.06 10*6/uL (ref 4.20–5.80)
RDW: 13.3 % (ref 11.0–15.0)
Total Lymphocyte: 38.7 %
WBC mixed population: 686 cells/uL (ref 200–950)
WBC: 6.6 10*3/uL (ref 3.8–10.8)

## 2016-12-11 LAB — MICROALBUMIN / CREATININE URINE RATIO
CREATININE, URINE: 53 mg/dL (ref 20–320)
Microalb Creat Ratio: 9 mcg/mg creat (ref <30)
Microalb, Ur: 0.5 mg/dL

## 2016-12-11 LAB — VITAMIN D 25 HYDROXY (VIT D DEFICIENCY, FRACTURES): Vit D, 25-Hydroxy: 69 ng/mL (ref 30–100)

## 2016-12-11 LAB — INSULIN, RANDOM: Insulin: 3.9 u[IU]/mL (ref 2.0–19.6)

## 2016-12-11 LAB — PSA: PSA: 3.6 ng/mL (ref <=4.0)

## 2016-12-11 LAB — TSH: TSH: 3.1 m[IU]/L (ref 0.40–4.50)

## 2016-12-11 LAB — URIC ACID: URIC ACID, SERUM: 4 mg/dL (ref 4.0–8.0)

## 2016-12-19 ENCOUNTER — Encounter: Payer: Self-pay | Admitting: Internal Medicine

## 2017-01-03 ENCOUNTER — Encounter: Payer: Self-pay | Admitting: Internal Medicine

## 2017-01-27 ENCOUNTER — Other Ambulatory Visit: Payer: Self-pay | Admitting: Physician Assistant

## 2017-03-15 NOTE — Progress Notes (Signed)
FOLLOW UP  Assessment and Plan:   Hypertension Well controlled with current medications  Monitor blood pressure at home; patient to call if consistently greater than 130/80 Continue DASH diet.   Reminder to go to the ER if any CP, SOB, nausea, dizziness, severe HA, changes vision/speech, left arm numbness and tingling and jaw pain.  Cholesterol Currently above goal; statin intolerant in past; imrproving with lifestyle and gemfibrozil Continue low cholesterol diet and exercise.  Check lipid panel.   Prediabetes Recent A1C tending down Continue diet and exercise.  Perform daily foot/skin check, notify office of any concerning changes.  Check A1C  Vitamin D Def At goal at last visit; continue supplementation to maintain goal of 70-100 Defer Vit D level  GERD Symptoms well managed without breakthrough Will try to get off PPI given info for taper and zantac sent in Discussed diet, avoiding triggers and other lifestyle changes  Gout Continue allopurinol Diet discussed Check uric acid as needed  Right hip pain Take mobic 1 tab as needed at night for intermittent hip pain Avoid sleeping on affected extremity Follow up if symptoms not improving in 1-3 months, consider xray and orthopedics  Continue diet and meds as discussed. Further disposition pending results of labs. Discussed med's effects and SE's.   Over 30 minutes of exam, counseling, chart review, and critical decision making was performed.   Future Appointments  Date Time Provider Department Center  06/16/2017  3:30 PM Lucky CowboyMcKeown, William, MD [facility] None  01/06/2018  2:00 PM Lucky CowboyMcKeown, William, MD [facility] None    ----------------------------------------------------------------------------------------------------------------------  HPI 83 y.o. male  presents for 3 month follow up on hypertension, cholesterol, prediabetes, GERD, gout, weight and vitamin D deficiency. He reports intermittent R hip pain at night that  awakens him. He has been taking aleve on occasion for this which reportedly helps but doesn't last through the night. Denies numbness/tingling/lower back pain, weakness of extremity.   BMI is Body mass index is 26.17 kg/m., he has been working on diet and exercise, thought he states he has only done very mild changes, needs to do more.  Wt Readings from Last 3 Encounters:  03/16/17 185 lb (83.9 kg)  12/10/16 186 lb (84.4 kg)  08/24/16 182 lb 6.4 oz (82.7 kg)   he has a diagnosis of GERD which is currently managed by prilosec 40 mg daily.  he reports symptoms is currently well controlled, and denies breakthrough reflux, burning in chest, hoarseness or cough.    His blood pressure has been controlled at home, today their BP is BP: 128/78  He does workout. He denies chest pain, shortness of breath, dizziness.   He is on cholesterol medication and denies myalgias. His cholesterol is not at goal. The cholesterol last visit was:   Lab Results  Component Value Date   CHOL 245 (H) 12/10/2016   HDL 95 12/10/2016   LDLCALC 141 (H) 08/24/2016   TRIG 77 12/10/2016   CHOLHDL 2.6 12/10/2016    He has been working on diet and exercise for prediabetes, and denies increased appetite, nausea, paresthesia of the feet, polydipsia, polyuria, visual disturbances and vomiting. Last A1C in the office was:  Lab Results  Component Value Date   HGBA1C 5.7 (H) 12/10/2016   Patient is on Vitamin D supplement and at goal at the last visit:    Lab Results  Component Value Date   VD25OH 69 12/10/2016     Patient is on allopurinol for gout and does not report a recent flare.  Lab Results  Component Value Date   LABURIC 4.0 12/10/2016    Current Medications:  Current Outpatient Medications on File Prior to Visit  Medication Sig  . allopurinol (ZYLOPRIM) 300 MG tablet TAKE 1 TABLET BY MOUTH  EVERY DAY TO PREVENT GOUT  . atenolol (TENORMIN) 100 MG tablet TAKE 1 TABLET BY MOUTH  DAILY  . Cholecalciferol  (VITAMIN D PO) Take 2,000 Int'l Units by mouth. Alternates 4000 units and 6000 unit every other day.  . clonazePAM (KLONOPIN) 2 MG tablet Take 1 tablet (2 mg total) by mouth at bedtime as needed. for sleep  . gemfibrozil (LOPID) 600 MG tablet TAKE 1 TABLET BY MOUTH TWO  TIMES DAILY  . hydrochlorothiazide (HYDRODIURIL) 25 MG tablet TAKE 1 TABLET BY MOUTH  DAILY  . omeprazole (PRILOSEC) 40 MG capsule TAKE 1 CAPSULE DAILY FOR  ACID INDIGESTION AND ACID  REFLUX  . sildenafil (REVATIO) 20 MG tablet Take 2 to 5 tablets as needed once daily prior to erection   No current facility-administered medications on file prior to visit.      Allergies:  Allergies  Allergen Reactions  . Ace Inhibitors Cough  . Lipitor [Atorvastatin]     Elevates CPK and Aldolase     Medical History:  Past Medical History:  Diagnosis Date  . Gout   . Hyperlipidemia   . Hypertension   . Prediabetes    Family history- Reviewed and unchanged Social history- Reviewed and unchanged   Review of Systems:  Review of Systems  Constitutional: Negative for malaise/fatigue and weight loss.  HENT: Negative for hearing loss and tinnitus.   Eyes: Negative for blurred vision and double vision.  Respiratory: Negative for cough, shortness of breath and wheezing.   Cardiovascular: Negative for chest pain, palpitations, orthopnea, claudication and leg swelling.  Gastrointestinal: Negative for abdominal pain, blood in stool, constipation, diarrhea, heartburn, melena, nausea and vomiting.  Genitourinary: Negative.   Musculoskeletal: Positive for joint pain (Right hip pain intermittent at night). Negative for myalgias.  Skin: Negative for rash.  Neurological: Negative for dizziness, tingling, sensory change, weakness and headaches.  Endo/Heme/Allergies: Negative for polydipsia.  Psychiatric/Behavioral: Negative.   All other systems reviewed and are negative.   Physical Exam: BP 128/78   Pulse 63   Temp (!) 97.5 F (36.4  C)   Ht 5' 10.5" (1.791 m)   Wt 185 lb (83.9 kg)   SpO2 95%   BMI 26.17 kg/m  Wt Readings from Last 3 Encounters:  03/16/17 185 lb (83.9 kg)  12/10/16 186 lb (84.4 kg)  08/24/16 182 lb 6.4 oz (82.7 kg)   General Appearance: Well nourished, in no apparent distress. Eyes: PERRLA, EOMs, conjunctiva no swelling or erythema Sinuses: No Frontal/maxillary tenderness ENT/Mouth: Ext aud canals clear, TMs without erythema, bulging. No erythema, swelling, or exudate on post pharynx.  Tonsils not swollen or erythematous. Hearing normal.  Neck: Supple, thyroid normal.  Respiratory: Respiratory effort normal, BS equal bilaterally without rales, rhonchi, wheezing or stridor.  Cardio: RRR with no MRGs. Brisk peripheral pulses without edema.  Abdomen: Soft, + BS.  Non tender, no guarding, rebound, hernias, masses. Lymphatics: Non tender without lymphadenopathy.  Musculoskeletal: Full ROM, 5/5 strength, Normal gait Skin: Warm, dry without rashes, lesions, ecchymosis.  Neuro: Cranial nerves intact. No cerebellar symptoms.  Psych: Awake and oriented X 3, normal affect, Insight and Judgment appropriate.    Dan Maker, NP 3:38 PM [facility]

## 2017-03-16 ENCOUNTER — Ambulatory Visit (INDEPENDENT_AMBULATORY_CARE_PROVIDER_SITE_OTHER): Payer: Medicare Other | Admitting: Adult Health

## 2017-03-16 ENCOUNTER — Encounter: Payer: Self-pay | Admitting: Adult Health

## 2017-03-16 VITALS — BP 128/78 | HR 63 | Temp 97.5°F | Ht 70.5 in | Wt 185.0 lb

## 2017-03-16 DIAGNOSIS — Z79899 Other long term (current) drug therapy: Secondary | ICD-10-CM | POA: Diagnosis not present

## 2017-03-16 DIAGNOSIS — K219 Gastro-esophageal reflux disease without esophagitis: Secondary | ICD-10-CM

## 2017-03-16 DIAGNOSIS — E559 Vitamin D deficiency, unspecified: Secondary | ICD-10-CM | POA: Diagnosis not present

## 2017-03-16 DIAGNOSIS — E782 Mixed hyperlipidemia: Secondary | ICD-10-CM

## 2017-03-16 DIAGNOSIS — I10 Essential (primary) hypertension: Secondary | ICD-10-CM | POA: Diagnosis not present

## 2017-03-16 DIAGNOSIS — M1000 Idiopathic gout, unspecified site: Secondary | ICD-10-CM

## 2017-03-16 DIAGNOSIS — R7303 Prediabetes: Secondary | ICD-10-CM

## 2017-03-16 MED ORDER — MELOXICAM 15 MG PO TABS: ORAL_TABLET | ORAL | 0 refills | Status: DC

## 2017-03-16 MED ORDER — RANITIDINE HCL 150 MG PO TABS: 150.0000 mg | ORAL_TABLET | Freq: Two times a day (BID) | ORAL | 2 refills | Status: DC

## 2017-03-16 NOTE — Patient Instructions (Signed)
Can take 1 tab mobic daily as needed for hip pain - can take with tylenol, do not take with ibuprofen/aleve/motrin     Fat and Cholesterol Restricted Diet Getting too much fat and cholesterol in your diet may cause health problems. Following this diet helps keep your fat and cholesterol at normal levels. This can keep you from getting sick. What types of fat should I choose?  Choose monosaturated and polyunsaturated fats. These are found in foods such as olive oil, canola oil, flaxseeds, walnuts, almonds, and seeds.  Eat more omega-3 fats. Good choices include salmon, mackerel, sardines, tuna, flaxseed oil, and ground flaxseeds.  Limit saturated fats. These are in animal products such as meats, butter, and cream. They can also be in plant products such as palm oil, palm kernel oil, and coconut oil.  Avoid foods with partially hydrogenated oils in them. These contain trans fats. Examples of foods that have trans fats are stick margarine, some tub margarines, cookies, crackers, and other baked goods. What general guidelines do I need to follow?  Check food labels. Look for the words "trans fat" and "saturated fat."  When preparing a meal: ? Fill half of your plate with vegetables and green salads. ? Fill one fourth of your plate with whole grains. Look for the word "whole" as the first word in the ingredient list. ? Fill one fourth of your plate with lean protein foods.  Eat more foods that have fiber, like apples, carrots, beans, peas, and barley.  Eat more home-cooked foods. Eat less at restaurants and buffets.  Limit or avoid alcohol.  Limit foods high in starch and sugar.  Limit fried foods.  Cook foods without frying them. Baking, boiling, grilling, and broiling are all great options.  Lose weight if you are overweight. Losing even a small amount of weight can help your overall health. It can also help prevent diseases such as diabetes and heart disease. What foods can I  eat? Grains Whole grains, such as whole wheat or whole grain breads, crackers, cereals, and pasta. Unsweetened oatmeal, bulgur, barley, quinoa, or brown rice. Corn or whole wheat flour tortillas. Vegetables Fresh or frozen vegetables (raw, steamed, roasted, or grilled). Green salads. Fruits All fresh, canned (in natural juice), or frozen fruits. Meat and Other Protein Products Ground beef (85% or leaner), grass-fed beef, or beef trimmed of fat. Skinless chicken or [place]. Ground chicken or [place]. Pork trimmed of fat. All fish and seafood. Eggs. Dried beans, peas, or lentils. Unsalted nuts or seeds. Unsalted canned or dry beans. Dairy Low-fat dairy products, such as skim or 1% milk, 2% or reduced-fat cheeses, low-fat ricotta or cottage cheese, or plain low-fat yogurt. Fats and Oils Tub margarines without trans fats. Light or reduced-fat mayonnaise and salad dressings. Avocado. Olive, canola, sesame, or safflower oils. Natural peanut or almond butter (choose ones without added sugar and oil). The items listed above may not be a complete list of recommended foods or beverages. Contact your dietitian for more options. What foods are not recommended? Grains White bread. White pasta. White rice. Cornbread. Bagels, pastries, and croissants. Crackers that contain trans fat. Vegetables White potatoes. Corn. Creamed or fried vegetables. Vegetables in a cheese sauce. Fruits Dried fruits. Canned fruit in light or heavy syrup. Fruit juice. Meat and Other Protein Products Fatty cuts of meat. Ribs, chicken wings, bacon, sausage, bologna, salami, chitterlings, fatback, hot dogs, bratwurst, and packaged luncheon meats. Liver and organ meats. Dairy Whole or 2% milk, cream, half-and-half, and cream cheese.  Whole milk cheeses. Whole-fat or sweetened yogurt. Full-fat cheeses. Nondairy creamers and whipped toppings. Processed cheese, cheese spreads, or cheese curds. Sweets and Desserts Corn syrup, sugars,  honey, and molasses. Candy. Jam and jelly. Syrup. Sweetened cereals. Cookies, pies, cakes, donuts, muffins, and ice cream. Fats and Oils Butter, stick margarine, lard, shortening, ghee, or bacon fat. Coconut, palm kernel, or palm oils. Beverages Alcohol. Sweetened drinks (such as sodas, lemonade, and fruit drinks or punches). The items listed above may not be a complete list of foods and beverages to avoid. Contact your dietitian for more information. This information is not intended to replace advice given to you by your health care provider. Make sure you discuss any questions you have with your health care provider. Document Released: 08/18/2011 Document Revised: 10/24/2015 Document Reviewed: 05/18/2013 Elsevier Interactive Patient Education  [employer].

## 2017-03-17 LAB — LIPID PANEL
CHOL/HDL RATIO: 2.7 (calc) (ref <5.0)
CHOLESTEROL: 229 mg/dL — AB (ref <200)
HDL: 84 mg/dL (ref >40)
LDL CHOLESTEROL (CALC): 127 mg/dL — AB
Non-HDL Cholesterol (Calc): 145 mg/dL (calc) — ABNORMAL HIGH (ref <130)
TRIGLYCERIDES: 79 mg/dL (ref <150)

## 2017-03-17 LAB — BASIC METABOLIC PANEL WITH GFR
BUN: 17 mg/dL (ref 7–25)
CO2: 30 mmol/L (ref 20–32)
Calcium: 9.4 mg/dL (ref 8.6–10.3)
Chloride: 98 mmol/L (ref 98–110)
Creat: 1 mg/dL (ref 0.70–1.11)
GFR, EST AFRICAN AMERICAN: 82 mL/min/{1.73_m2} (ref >=60)
GFR, EST NON AFRICAN AMERICAN: 71 mL/min/{1.73_m2} (ref >=60)
Glucose, Bld: 89 mg/dL (ref 65–99)
Potassium: 4.2 mmol/L (ref 3.5–5.3)
Sodium: 137 mmol/L (ref 135–146)

## 2017-03-17 LAB — CBC WITH DIFFERENTIAL/PLATELET
BASOS PCT: 1.2 %
Basophils Absolute: 60 cells/uL (ref 0–200)
Eosinophils Absolute: 470 cells/uL (ref 15–500)
Eosinophils Relative: 9.4 %
HEMATOCRIT: 41.5 % (ref 38.5–50.0)
Hemoglobin: 14.2 g/dL (ref 13.2–17.1)
LYMPHS ABS: 2465 {cells}/uL (ref 850–3900)
MCH: 30.5 pg (ref 27.0–33.0)
MCHC: 34.2 g/dL (ref 32.0–36.0)
MCV: 89.2 fL (ref 80.0–100.0)
MPV: 10.3 fL (ref 7.5–12.5)
Monocytes Relative: 10.4 %
Neutro Abs: 1485 cells/uL — ABNORMAL LOW (ref 1500–7800)
Neutrophils Relative %: 29.7 %
Platelets: 269 10*3/uL (ref 140–400)
RBC: 4.65 10*6/uL (ref 4.20–5.80)
RDW: 13.2 % (ref 11.0–15.0)
Total Lymphocyte: 49.3 %
WBC mixed population: 520 cells/uL (ref 200–950)
WBC: 5 10*3/uL (ref 3.8–10.8)

## 2017-03-17 LAB — HEMOGLOBIN A1C
HEMOGLOBIN A1C: 6 %{Hb} — AB (ref <5.7)
Mean Plasma Glucose: 126 (calc)
eAG (mmol/L): 7 (calc)

## 2017-03-17 LAB — HEPATIC FUNCTION PANEL
AG Ratio: 1.3 (calc) (ref 1.0–2.5)
ALBUMIN MSPROF: 4.3 g/dL (ref 3.6–5.1)
ALT: 13 U/L (ref 9–46)
AST: 26 U/L (ref 10–35)
Alkaline phosphatase (APISO): 77 U/L (ref 40–115)
BILIRUBIN DIRECT: 0.1 mg/dL (ref 0.0–0.2)
BILIRUBIN TOTAL: 0.3 mg/dL (ref 0.2–1.2)
GLOBULIN: 3.3 g/dL (ref 1.9–3.7)
Indirect Bilirubin: 0.2 mg/dL (calc) (ref 0.2–1.2)
Total Protein: 7.6 g/dL (ref 6.1–8.1)

## 2017-03-17 LAB — TSH: TSH: 2.21 mIU/L (ref 0.40–4.50)

## 2017-03-30 DIAGNOSIS — G4733 Obstructive sleep apnea (adult) (pediatric): Secondary | ICD-10-CM | POA: Diagnosis not present

## 2017-04-07 ENCOUNTER — Telehealth: Payer: Self-pay | Admitting: Internal Medicine

## 2017-04-07 NOTE — Telephone Encounter (Signed)
Patient called to request new CPAP equipment. Stated that [facility] recommended new equipment. No record of sleep study, compliance or benefit, Advised to call Carilyn GoodpastureJennifer Willard, [facility] [address], [place], [place] [address].  Call: [phone] She Did Sleep Eval 03-05-16, w/ no further records in Epic

## 2017-04-21 DIAGNOSIS — G4733 Obstructive sleep apnea (adult) (pediatric): Secondary | ICD-10-CM | POA: Diagnosis not present

## 2017-05-04 ENCOUNTER — Other Ambulatory Visit: Payer: Self-pay | Admitting: Adult Health

## 2017-05-18 DIAGNOSIS — G4733 Obstructive sleep apnea (adult) (pediatric): Secondary | ICD-10-CM | POA: Diagnosis not present

## 2017-05-20 ENCOUNTER — Other Ambulatory Visit: Payer: Self-pay | Admitting: Internal Medicine

## 2017-05-26 ENCOUNTER — Other Ambulatory Visit: Payer: Self-pay | Admitting: Internal Medicine

## 2017-05-26 ENCOUNTER — Other Ambulatory Visit: Payer: Self-pay | Admitting: *Deleted

## 2017-05-26 MED ORDER — RANITIDINE HCL 150 MG PO TABS: 150.0000 mg | ORAL_TABLET | Freq: Two times a day (BID) | ORAL | 2 refills | Status: DC

## 2017-05-26 MED ORDER — CLONAZEPAM 2 MG PO TABS: ORAL_TABLET | ORAL | 0 refills | Status: DC

## 2017-05-27 DIAGNOSIS — G4733 Obstructive sleep apnea (adult) (pediatric): Secondary | ICD-10-CM | POA: Diagnosis not present

## 2017-05-27 DIAGNOSIS — H25813 Combined forms of age-related cataract, bilateral: Secondary | ICD-10-CM | POA: Diagnosis not present

## 2017-05-27 DIAGNOSIS — H40003 Preglaucoma, unspecified, bilateral: Secondary | ICD-10-CM | POA: Diagnosis not present

## 2017-06-10 ENCOUNTER — Encounter: Payer: Self-pay | Admitting: Adult Health

## 2017-06-10 ENCOUNTER — Ambulatory Visit (INDEPENDENT_AMBULATORY_CARE_PROVIDER_SITE_OTHER): Payer: Medicare Other | Admitting: Adult Health

## 2017-06-10 VITALS — BP 110/70 | HR 57 | Temp 97.3°F | Ht 70.5 in | Wt 188.0 lb

## 2017-06-10 DIAGNOSIS — J029 Acute pharyngitis, unspecified: Secondary | ICD-10-CM | POA: Diagnosis not present

## 2017-06-10 MED ORDER — AZITHROMYCIN 250 MG PO TABS: ORAL_TABLET | ORAL | 1 refills | Status: AC

## 2017-06-10 MED ORDER — PREDNISONE 20 MG PO TABS: ORAL_TABLET | ORAL | 0 refills | Status: DC

## 2017-06-10 MED ORDER — PROMETHAZINE-DM 6.25-15 MG/5ML PO SYRP: 5.0000 mL | ORAL_SOLUTION | Freq: Four times a day (QID) | ORAL | 1 refills | Status: DC | PRN

## 2017-06-10 NOTE — Patient Instructions (Addendum)
May consider starting on daily allergra/fexofenadine (generic)    HOW TO TREAT VIRAL COUGH AND COLD SYMPTOMS:  -Symptoms usually last at least 1 week with the worst symptoms being around day 4.  - colds usually start with a sore throat and end with a cough, and the cough can take 2 weeks to get better.  -No antibiotics are needed for colds, flu, sore throats, cough, bronchitis UNLESS symptoms are longer than 7 days OR if you are getting better then get drastically worse.  -There are a lot of combination medications (Dayquil, Nyquil, Vicks 44, tyelnol cold and sinus, ETC). Please look at the ingredients on the back so that you are treating the correct symptoms and not doubling up on medications/ingredients.    Medicines you can use  Nasal congestion  Little Remedies saline spray (aerosol/mist)- can try this, it is in the kids section - pseudoephedrine (Sudafed)- behind the counter, do not use if you have high blood pressure, medicine that have -D in them.  - phenylephrine (Sudafed PE) -Dextormethorphan + chlorpheniramine (Coridcidin HBP)- okay if you have high blood pressure -Oxymetazoline (Afrin) nasal spray- LIMIT to 3 days -Saline nasal spray -Neti pot (used distilled or bottled water)  Ear pain/congestion  -pseudoephedrine (sudafed) - Nasonex/flonase nasal spray  Fever  -Acetaminophen (Tyelnol) -Ibuprofen (Advil, motrin, aleve)  Sore Throat  -Acetaminophen (Tyelnol) -Ibuprofen (Advil, motrin, aleve) -Drink a lot of water -Gargle with salt water - Rest your voice (don't talk) -Throat sprays -Cough drops  Body Aches  -Acetaminophen (Tyelnol) -Ibuprofen (Advil, motrin, aleve)  Headache  -Acetaminophen (Tyelnol) -Ibuprofen (Advil, motrin, aleve) - Exedrin, Exedrin Migraine  Allergy symptoms (cough, sneeze, runny nose, itchy eyes) -Claritin or loratadine cheapest but likely the weakest  -Zyrtec or certizine at night because it can make you sleepy -The strongest is  allegra or fexafinadine  Cheapest at [employer], [employer], [employer]  Cough  -Dextromethorphan (Delsym)- medicine that has DM in it -Guafenesin (Mucinex/Robitussin) - cough drops - drink lots of water  Chest Congestion  -Guafenesin (Mucinex/Robitussin)  Red Itchy Eyes  - Naphcon-A  Upset Stomach  - Bland diet (nothing spicy, greasy, fried, and high acid foods like tomatoes, oranges, berries) -OKAY- cereal, bread, soup, crackers, rice -Eat smaller more frequent meals -reduce caffeine, no alcohol -Loperamide (Imodium-AD) if diarrhea -Prevacid for heart burn  General health when sick  -Hydration -wash your hands frequently -keep surfaces clean -change pillow cases and sheets often -Get fresh air but do not exercise strenuously -Vitamin D, double up on it - Vitamin C -Zinc

## 2017-06-10 NOTE — Progress Notes (Signed)
Assessment and Plan:  Jeremy Boone was seen today for cough and sore throat.  Diagnoses and all orders for this visit:  Acute pharyngitis, unspecified etiology Likely viral or secondary to allergic rhinitis- Discussed the importance of avoiding unnecessary antibiotic therapy. Suggested symptomatic OTC remedies. Nasal saline spray for congestion. Nasal steroids, allergy pill, oral steroids Follow up as needed. -     predniSONE (DELTASONE) 20 MG tablet; 2 tablets daily for 3 days, 1 tablet daily for 4 days. -     promethazine-dextromethorphan (PROMETHAZINE-DM) 6.25-15 MG/5ML syrup; Take 5 mLs by mouth 4 (four) times daily as needed for cough.  Fill and take if symptoms not improving in 2-3 days or if develops productive cough or fever: -     azithromycin (ZITHROMAX) 250 MG tablet; Take 2 tablets (500 mg) on  Day 1,  followed by 1 tablet (250 mg) once daily on Days 2 through 5.  Further disposition pending results of labs. Discussed med's effects and SE's.   Over 15 minutes of exam, counseling, chart review, and critical decision making was performed.   Future Appointments  Date Time Provider Department Center  07/21/2017  3:30 PM Lucky CowboyMcKeown, William, MD [facility] None  01/06/2018  2:00 PM Lucky CowboyMcKeown, William, MD [facility] None    ------------------------------------------------------------------------------------------------------------------   HPI BP 110/70   Pulse (!) 57   Temp (!) 97.3 F (36.3 C)   Ht 5' 10.5" (1.791 m)   Wt 188 lb (85.3 kg)   SpO2 97%   BMI 26.59 kg/m   83 y.o. male with hx of OSA on CPAP presents for sore throat and dry hacking cough after a sensation of "throat tickle." He reports symptoms ongoing for 4-5 days, endorses mild congestion and headache, denies fever/chills, chest pain, palpitations, sneezing, itchy/watery eyes.   He has tried taking OTC cough medication and cough drops but reports symptoms have not improved significantly. He denies hx of seasonal  allergies.   Past Medical History:  Diagnosis Date  . Gout   . Hyperlipidemia   . Hypertension   . Prediabetes      Allergies  Allergen Reactions  . Ace Inhibitors Cough  . Lipitor [Atorvastatin]     Elevates CPK and Aldolase    Current Outpatient Medications on File Prior to Visit  Medication Sig  . allopurinol (ZYLOPRIM) 300 MG tablet TAKE 1 TABLET BY MOUTH  EVERY DAY TO PREVENT GOUT  . atenolol (TENORMIN) 100 MG tablet TAKE 1 TABLET BY MOUTH  DAILY  . Cholecalciferol (VITAMIN D PO) Take 4,000 Int'l Units by mouth. Alternates 4000 units and 6000 unit every other day.  . clonazePAM (KLONOPIN) 2 MG tablet Take 1/2 to 1 tablet at bedtime ONLY if needed for Sleep   & please try to limit to 5 days /week to avoid addiction  . gemfibrozil (LOPID) 600 MG tablet TAKE 1 TABLET BY MOUTH TWO  TIMES DAILY  . hydrochlorothiazide (HYDRODIURIL) 25 MG tablet TAKE 1 TABLET BY MOUTH  DAILY (Patient taking differently: TAKE ONE TABLET EVERY TWO DAYS)  . omeprazole (PRILOSEC) 40 MG capsule TAKE 1 CAPSULE DAILY FOR  ACID INDIGESTION AND ACID  REFLUX  . ranitidine (ZANTAC) 150 MG tablet Take 1 tablet (150 mg total) by mouth 2 (two) times daily.  . sildenafil (REVATIO) 20 MG tablet Take 2 to 5 tablets as needed once daily prior to erection  . meloxicam (MOBIC) 15 MG tablet TAKE 1 TABLET BY MOUTH  DAILY WITH FOOD AS NEEDED  FOR PAIN. CAN TAKE WITH  TYLENOL, NOT WITH ALEVE AND IBURPOFEN. (Patient not taking: Reported on 06/10/2017)   No current facility-administered medications on file prior to visit.     ROS: Review of Systems  Constitutional: Negative for chills, diaphoresis, fever and malaise/fatigue.  HENT: Positive for congestion and sore throat. Negative for ear discharge, ear pain, hearing loss, sinus pain and tinnitus.   Eyes: Negative for blurred vision, double vision, pain, discharge and redness.  Respiratory: Positive for cough. Negative for sputum production, shortness of breath and wheezing.    Cardiovascular: Negative for chest pain, palpitations and leg swelling.  Gastrointestinal: Negative for abdominal pain, diarrhea, heartburn, nausea and vomiting.  Musculoskeletal: Negative for myalgias.  Skin: Negative for rash.  Neurological: Positive for headaches (Mild). Negative for dizziness, sensory change and speech change.  Endo/Heme/Allergies: Negative for environmental allergies.    Physical Exam:  BP 110/70   Pulse (!) 57   Temp (!) 97.3 F (36.3 C)   Ht 5' 10.5" (1.791 m)   Wt 188 lb (85.3 kg)   SpO2 97%   BMI 26.59 kg/m   General Appearance: Well nourished, in no apparent distress. Eyes: PERRLA, EOMs, conjunctiva no swelling or erythema Sinuses: No Frontal/maxillary tenderness ENT/Mouth: Ext aud canals clear, TMs without erythema, bulging. Pharynx mildly erythematous without notable swelling, or exudate.  Tonsils not swollen or erythematous. Hearing normal.  Neck: Supple.  Respiratory: Respiratory effort normal, BS with scant rales end end expiratory rhonchi to bilateral bases without wheezing or stridor.  Cardio: RRR with no MRGs. Brisk peripheral pulses without edema.  Abdomen: Soft, + BS.  Non tender. Lymphatics: Non tender without lymphadenopathy.  Musculoskeletal: normal gait.  Skin: Warm, dry without rashes, lesions, ecchymosis.  Psych: Awake and oriented X 3, normal affect, Insight and Judgment appropriate.    Dan Maker, NP 9:47 AM [facility]

## 2017-06-16 ENCOUNTER — Ambulatory Visit: Payer: Self-pay | Admitting: Internal Medicine

## 2017-06-27 DIAGNOSIS — G4733 Obstructive sleep apnea (adult) (pediatric): Secondary | ICD-10-CM | POA: Diagnosis not present

## 2017-07-19 DIAGNOSIS — G4733 Obstructive sleep apnea (adult) (pediatric): Secondary | ICD-10-CM | POA: Diagnosis not present

## 2017-07-21 ENCOUNTER — Encounter: Payer: Self-pay | Admitting: Internal Medicine

## 2017-07-21 ENCOUNTER — Ambulatory Visit (INDEPENDENT_AMBULATORY_CARE_PROVIDER_SITE_OTHER): Payer: Medicare Other | Admitting: Internal Medicine

## 2017-07-21 VITALS — BP 114/68 | HR 56 | Temp 97.5°F | Resp 16 | Ht 70.5 in | Wt 181.8 lb

## 2017-07-21 DIAGNOSIS — R7309 Other abnormal glucose: Secondary | ICD-10-CM

## 2017-07-21 DIAGNOSIS — Z79899 Other long term (current) drug therapy: Secondary | ICD-10-CM

## 2017-07-21 DIAGNOSIS — I10 Essential (primary) hypertension: Secondary | ICD-10-CM

## 2017-07-21 DIAGNOSIS — E782 Mixed hyperlipidemia: Secondary | ICD-10-CM

## 2017-07-21 DIAGNOSIS — R7303 Prediabetes: Secondary | ICD-10-CM | POA: Diagnosis not present

## 2017-07-21 DIAGNOSIS — M1000 Idiopathic gout, unspecified site: Secondary | ICD-10-CM

## 2017-07-21 DIAGNOSIS — E559 Vitamin D deficiency, unspecified: Secondary | ICD-10-CM

## 2017-07-21 NOTE — Patient Instructions (Signed)

## 2017-07-21 NOTE — Progress Notes (Signed)
This very nice 83 y.o. MBM from [place]  presents for 6 month follow up with HTN, HLD, Pre-Diabetes and Vitamin D Deficiency. Patient also is on CPAP_ for OSA with improved sleep hygiene. Patient has hx/o Gout apparently controlled on his Allopurinol     Patient is treated for HTN (1998) & BP has been controlled at home. Today's BP is at goal - 114/68. Patient has had no complaints of any cardiac type chest pain, palpitations, dyspnea / orthopnea / PND, dizziness, claudication, or dependent edema.     Patient is Statin Intolerant and is on Gemfibrozil. Patient denies myalgias or other med SE's. Last Lipids were not at goal: Lab Results  Component Value Date   CHOL 229 (H) 03/16/2017   HDL 84 03/16/2017   LDLCALC 127 (H) 03/16/2017   TRIG 79 03/16/2017   CHOLHDL 2.7 03/16/2017      Also, the patient has history of PreDiabetes (A1c 6.3%/2011) and he has had no symptoms of reactive hypoglycemia, diabetic polys, paresthesias or visual blurring.  Last A1c was  Lab Results  Component Value Date   HGBA1C 6.0 (H) 03/16/2017      Further, the patient also has history of Vitamin D Deficiency ("25"/2008)  and supplements vitamin D without any suspected side-effects. Last vitamin D was at goal:  Lab Results  Component Value Date   VD25OH 69 12/10/2016   Current Outpatient Medications on File Prior to Visit  Medication Sig  . allopurinol (ZYLOPRIM) 300 MG tablet TAKE 1 TABLET BY MOUTH  EVERY DAY TO PREVENT GOUT  . atenolol (TENORMIN) 100 MG tablet TAKE 1 TABLET BY MOUTH  DAILY  . Cholecalciferol (VITAMIN D PO) Take 2,000 Int'l Units by mouth 2 (two) times daily.   . clonazePAM (KLONOPIN) 2 MG tablet Take 1/2 to 1 tablet at bedtime ONLY if needed for Sleep   & please try to limit to 5 days /week to avoid addiction  . gemfibrozil (LOPID) 600 MG tablet TAKE 1 TABLET BY MOUTH TWO  TIMES DAILY  . hydrochlorothiazide (HYDRODIURIL) 25 MG tablet TAKE 1 TABLET BY MOUTH  DAILY (Patient taking  differently: TAKE ONE TABLET EVERY TWO DAYS)  . meloxicam (MOBIC) 15 MG tablet TAKE 1 TABLET BY MOUTH  DAILY WITH FOOD AS NEEDED  FOR PAIN. CAN TAKE WITH  TYLENOL, NOT WITH ALEVE AND IBURPOFEN.  Marland Kitchen omeprazole (PRILOSEC) 40 MG capsule TAKE 1 CAPSULE DAILY FOR  ACID INDIGESTION AND ACID  REFLUX  . ranitidine (ZANTAC) 150 MG tablet Take 1 tablet (150 mg total) by mouth 2 (two) times daily.  . sildenafil (REVATIO) 20 MG tablet Take 2 to 5 tablets as needed once daily prior to erection   No current facility-administered medications on file prior to visit.    Allergies  Allergen Reactions  . Ace Inhibitors Cough  . Lipitor [Atorvastatin]     Elevates CPK and Aldolase   PMHx:   Past Medical History:  Diagnosis Date  . Gout   . Hyperlipidemia   . Hypertension   . Prediabetes    Immunization History  Administered Date(s) Administered  . Influenza, High Dose Seasonal PF 12/18/2013, 10/28/2015, 12/10/2016  . Influenza,inj,Quad PF,6+ Mos 03/07/2013  . Pneumococcal Conjugate-13 03/28/2014  . Pneumococcal-Unspecified 03/03/2007  . Td 03/02/2004  . Tdap 10/25/2014  . Zoster 08/26/2016   Past Surgical History:  Procedure Laterality Date  . Cardiac Event Monitor  May-June 2017   Sinus rhythm with average heart rate 74.2 - no notable arrhythmias.  Minimal PVCs. No PACs.  . CARPAL TUNNEL RELEASE Left 2007  . OTHER SURGICAL HISTORY  2007   Negative biopsy tonsil mass  . ROTATOR CUFF REPAIR Left 2000   FHx:    Reviewed / unchanged  SHx:    Reviewed / unchanged   Systems Review:  Constitutional: Denies fever, chills, wt changes, headaches, insomnia, fatigue, night sweats, change in appetite. Eyes: Denies redness, blurred vision, diplopia, discharge, itchy, watery eyes.  ENT: Denies discharge, congestion, post nasal drip, epistaxis, sore throat, earache, hearing loss, dental pain, tinnitus, vertigo, sinus pain, snoring.  CV: Denies chest pain, palpitations, irregular heartbeat, syncope,  dyspnea, diaphoresis, orthopnea, PND, claudication or edema. Respiratory: denies cough, dyspnea, DOE, pleurisy, hoarseness, laryngitis, wheezing.  Gastrointestinal: Denies dysphagia, odynophagia, heartburn, reflux, water brash, abdominal pain or cramps, nausea, vomiting, bloating, diarrhea, constipation, hematemesis, melena, hematochezia  or hemorrhoids. Genitourinary: Denies dysuria, frequency, urgency, nocturia, hesitancy, discharge, hematuria or flank pain. Musculoskeletal: Denies arthralgias, myalgias, stiffness, jt. swelling, pain, limping or strain/sprain.  Skin: Denies pruritus, rash, hives, warts, acne, eczema or change in skin lesion(s). Neuro: No weakness, tremor, incoordination, spasms, paresthesia or pain. Psychiatric: Denies confusion, memory loss or sensory loss. Endo: Denies change in weight, skin or hair change.  Heme/Lymph: No excessive bleeding, bruising or enlarged lymph nodes.  Physical Exam  BP 114/68   Pulse (!) 56   Temp (!) 97.5 F (36.4 C)   Resp 16   Ht 5' 10.5" (1.791 m)   Wt 181 lb 12.8 oz (82.5 kg)   BMI 25.72 kg/m   Appears  well nourished, well groomed  and in no distress.  Eyes: PERRLA, EOMs, conjunctiva no swelling or erythema. Sinuses: No frontal/maxillary tenderness ENT/Mouth: EAC's clear, TM's nl w/o erythema, bulging. Nares clear w/o erythema, swelling, exudates. Oropharynx clear without erythema or exudates. Oral hygiene is good. Tongue normal, non obstructing. Hearing intact.  Neck: Supple. Thyroid not palpable. Car 2+/2+ without bruits, nodes or JVD. Chest: Respirations nl with BS clear & equal w/o rales, rhonchi, wheezing or stridor.  Cor: Heart sounds normal w/ regular rate and rhythm without sig. murmurs, gallops, clicks or rubs. Peripheral pulses normal and equal  without edema.  Abdomen: Soft & bowel sounds normal. Non-tender w/o guarding, rebound, hernias, masses or organomegaly.  Lymphatics: Unremarkable.  Musculoskeletal: Full ROM all  peripheral extremities, joint stability, 5/5 strength and normal gait.  Skin: Warm, dry without exposed rashes, lesions or ecchymosis apparent.  Neuro: Cranial nerves intact, reflexes equal bilaterally. Sensory-motor testing grossly intact. Tendon reflexes grossly intact.  Pysch: Alert & oriented x 3.  Insight and judgement nl & appropriate. No ideations.  Assessment and Plan:  1. Essential hypertension  - Continue medication, monitor blood pressure at home.  - Continue DASH diet.  Reminder to go to the ER if any CP,  SOB, nausea, dizziness, severe HA, changes vision/speech.  - CBC with Differential/Platelet - COMPLETE METABOLIC PANEL WITH GFR - Magnesium - TSH  2. Hyperlipidemia, mixed  - Continue diet/meds, exercise,& lifestyle modifications.  - Continue monitor periodic cholesterol/liver & renal functions   - Lipid panel - TSH  3. Abnormal glucose  - Continue diet, exercise, lifestyle modifications.  - Monitor appropriate labs.  - Hemoglobin A1c - Insulin, random  4. Vitamin D deficiency  - Continue supplementation.   - VITAMIN D 25 Hydroxyl  5. Prediabetes  - Hemoglobin A1c - Insulin, random  6. Idiopathic gout  - Uric acid  7. Medication management  - CBC with Differential/Platelet - COMPLETE METABOLIC  PANEL WITH GFR - Magnesium - Lipid panel - TSH - Hemoglobin A1c - Insulin, random - VITAMIN D 25 Hydroxyl - Uric acid         Discussed  regular exercise, BP monitoring, weight control to achieve/maintain BMI less than 25 and discussed med and SE's. Recommended labs to assess and monitor clinical status with further disposition pending results of labs. Over 30 minutes of exam, counseling, chart review was performed.

## 2017-07-22 LAB — CBC WITH DIFFERENTIAL/PLATELET
BASOS ABS: 32 {cells}/uL (ref 0–200)
Basophils Relative: 0.7 %
EOS ABS: 378 {cells}/uL (ref 15–500)
Eosinophils Relative: 8.4 %
HCT: 41.5 % (ref 38.5–50.0)
Hemoglobin: 14.3 g/dL (ref 13.2–17.1)
Lymphs Abs: 2385 cells/uL (ref 850–3900)
MCH: 30.8 pg (ref 27.0–33.0)
MCHC: 34.5 g/dL (ref 32.0–36.0)
MCV: 89.4 fL (ref 80.0–100.0)
MONOS PCT: 10.6 %
MPV: 10.4 fL (ref 7.5–12.5)
Neutro Abs: 1229 cells/uL — ABNORMAL LOW (ref 1500–7800)
Neutrophils Relative %: 27.3 %
PLATELETS: 252 10*3/uL (ref 140–400)
RBC: 4.64 10*6/uL (ref 4.20–5.80)
RDW: 12.9 % (ref 11.0–15.0)
TOTAL LYMPHOCYTE: 53 %
WBC mixed population: 477 cells/uL (ref 200–950)
WBC: 4.5 10*3/uL (ref 3.8–10.8)

## 2017-07-22 LAB — COMPLETE METABOLIC PANEL WITH GFR
AG RATIO: 1.5 (calc) (ref 1.0–2.5)
ALKALINE PHOSPHATASE (APISO): 64 U/L (ref 40–115)
ALT: 9 U/L (ref 9–46)
AST: 23 U/L (ref 10–35)
Albumin: 4.5 g/dL (ref 3.6–5.1)
BILIRUBIN TOTAL: 0.4 mg/dL (ref 0.2–1.2)
BUN: 14 mg/dL (ref 7–25)
CHLORIDE: 99 mmol/L (ref 98–110)
CO2: 30 mmol/L (ref 20–32)
Calcium: 9.7 mg/dL (ref 8.6–10.3)
Creat: 1.07 mg/dL (ref 0.70–1.11)
GFR, Est African American: 75 mL/min/{1.73_m2} (ref >=60)
GFR, Est Non African American: 65 mL/min/{1.73_m2} (ref >=60)
GLOBULIN: 3 g/dL (ref 1.9–3.7)
Glucose, Bld: 117 mg/dL — ABNORMAL HIGH (ref 65–99)
POTASSIUM: 4.1 mmol/L (ref 3.5–5.3)
SODIUM: 136 mmol/L (ref 135–146)
Total Protein: 7.5 g/dL (ref 6.1–8.1)

## 2017-07-22 LAB — HEMOGLOBIN A1C
EAG (MMOL/L): 7.1 (calc)
Hgb A1c MFr Bld: 6.1 % of total Hgb — ABNORMAL HIGH (ref <5.7)
MEAN PLASMA GLUCOSE: 128 (calc)

## 2017-07-22 LAB — URIC ACID: URIC ACID, SERUM: 4.4 mg/dL (ref 4.0–8.0)

## 2017-07-22 LAB — INSULIN, RANDOM: Insulin: 15.4 u[IU]/mL (ref 2.0–19.6)

## 2017-07-22 LAB — LIPID PANEL
CHOLESTEROL: 207 mg/dL — AB (ref <200)
HDL: 71 mg/dL (ref >40)
LDL Cholesterol (Calc): 120 mg/dL (calc) — ABNORMAL HIGH
Non-HDL Cholesterol (Calc): 136 mg/dL (calc) — ABNORMAL HIGH (ref <130)
Total CHOL/HDL Ratio: 2.9 (calc) (ref <5.0)
Triglycerides: 68 mg/dL (ref <150)

## 2017-07-22 LAB — VITAMIN D 25 HYDROXY (VIT D DEFICIENCY, FRACTURES): VIT D 25 HYDROXY: 63 ng/mL (ref 30–100)

## 2017-07-22 LAB — MAGNESIUM: MAGNESIUM: 2 mg/dL (ref 1.5–2.5)

## 2017-07-22 LAB — TSH: TSH: 1.75 mIU/L (ref 0.40–4.50)

## 2017-07-27 DIAGNOSIS — G4733 Obstructive sleep apnea (adult) (pediatric): Secondary | ICD-10-CM | POA: Diagnosis not present

## 2017-08-05 ENCOUNTER — Encounter: Payer: Self-pay | Admitting: Gastroenterology

## 2017-08-16 ENCOUNTER — Other Ambulatory Visit: Payer: Self-pay | Admitting: Physician Assistant

## 2017-08-27 DIAGNOSIS — G4733 Obstructive sleep apnea (adult) (pediatric): Secondary | ICD-10-CM | POA: Diagnosis not present

## 2017-09-26 DIAGNOSIS — G4733 Obstructive sleep apnea (adult) (pediatric): Secondary | ICD-10-CM | POA: Diagnosis not present

## 2017-10-18 ENCOUNTER — Encounter: Payer: Self-pay | Admitting: Adult Health

## 2017-10-18 ENCOUNTER — Ambulatory Visit (INDEPENDENT_AMBULATORY_CARE_PROVIDER_SITE_OTHER): Payer: Medicare Other | Admitting: Adult Health

## 2017-10-18 VITALS — BP 116/74 | HR 56 | Temp 97.4°F | Resp 18 | Ht 70.5 in | Wt 182.6 lb

## 2017-10-18 DIAGNOSIS — I10 Essential (primary) hypertension: Secondary | ICD-10-CM | POA: Diagnosis not present

## 2017-10-18 DIAGNOSIS — K5900 Constipation, unspecified: Secondary | ICD-10-CM | POA: Diagnosis not present

## 2017-10-18 DIAGNOSIS — Z86010 Personal history of colonic polyps: Secondary | ICD-10-CM

## 2017-10-18 DIAGNOSIS — K921 Melena: Secondary | ICD-10-CM

## 2017-10-18 DIAGNOSIS — K2100 Gastro-esophageal reflux disease with esophagitis, without bleeding: Secondary | ICD-10-CM

## 2017-10-18 MED ORDER — LACTULOSE 10 GM/15ML PO SOLN: ORAL | 0 refills | Status: DC

## 2017-10-18 NOTE — Progress Notes (Signed)
Assessment and Plan:  Jeremy Boone was seen today for constipation.  Diagnoses and all orders for this visit:  Constipation, unspecified constipation type - Increase fiber/ water intake, decrease caffeine, increase activity level, can add lactulose until BM soft. Laboratory tests per orders. Please go to the hospital if you have severe abdominal pain, vomiting, fever, CP, SOB.  -     lactulose (CHRONULAC) 10 GM/15ML solution; Take 10-30 ml up to twice daily as needed for constipation. -     CBC with Differential/Platelet -     COMPLETE METABOLIC PANEL WITH GFR  Tarry stool/ hx of GERD with esophagitis/ hx of polyps ? Possible upper GI bleed Stop meloxicam, will restart PPI if hemoccult + If hemoccult positive will refer back to GI (est with Dr. Arlyce DiceKaplan) -     CBC with Differential/Platelet -     POC Hemoccult Bld/Stl (3-Cd Home Screen); Future  Further disposition pending results of labs. Discussed med's effects and SE's.   Over 30 minutes of exam, counseling, chart review, and critical decision making was performed.   Future Appointments  Date Time Provider Department Center  10/26/2017  2:30 PM Quentin MullingCollier, Amanda, PA-C [facility] None  01/06/2018  2:00 PM Lucky CowboyMcKeown, William, MD [facility] None    ------------------------------------------------------------------------------------------------------------------   HPI BP 116/74   Pulse (!) 56   Temp (!) 97.4 F (36.3 C)   Resp 18   Ht 5' 10.5" (1.791 m)   Wt 182 lb 9.6 oz (82.8 kg)   BMI 25.83 kg/m   83 y.o.male presents for discussion of constipation; he reports last "good" BM was 3-4 days ago, did have 2 very small BMs today "like small balls" - he does admit to some mild lower abdominal discomfort. He also reports having some liquid stool leaking when he thought he was passing gas. He does also endorse "black tarry" discharge this AM on the tissue. He reports he has taken some metamucil - 2-3 times over this past weekend.   He denies  fever/chills, weakness, dizziness.   Last colonoscopy 2009 - showed diverticulosis and few non-adenomatous polyps. He had EGD in 07/2013 which showed erosive esophagitis and has been on prilosec until recently when transitioned to ranitidine 150 mg BID; he denies any GERD/reflux/burning symptoms. He is on meloxicam PRN.   Past Medical History:  Diagnosis Date  . Gout   . Hyperlipidemia   . Hypertension   . Prediabetes      Allergies  Allergen Reactions  . Ace Inhibitors Cough  . Lipitor [Atorvastatin]     Elevates CPK and Aldolase    Current Outpatient Medications on File Prior to Visit  Medication Sig  . allopurinol (ZYLOPRIM) 300 MG tablet TAKE 1 TABLET BY MOUTH  EVERY DAY TO PREVENT GOUT  . atenolol (TENORMIN) 100 MG tablet TAKE 1 TABLET BY MOUTH  DAILY  . Cholecalciferol (VITAMIN D PO) Take 2,000 Int'l Units by mouth 2 (two) times daily.   Marland Kitchen. gemfibrozil (LOPID) 600 MG tablet TAKE 1 TABLET BY MOUTH TWO  TIMES DAILY  . hydrochlorothiazide (HYDRODIURIL) 25 MG tablet TAKE 1 TABLET BY MOUTH  DAILY (Patient taking differently: TAKE ONE TABLET EVERY TWO DAYS)  . meloxicam (MOBIC) 15 MG tablet TAKE 1 TABLET BY MOUTH  DAILY WITH FOOD AS NEEDED  FOR PAIN. CAN TAKE WITH  TYLENOL, NOT WITH ALEVE AND IBURPOFEN.  . ranitidine (ZANTAC) 150 MG tablet Take 1 tablet (150 mg total) by mouth 2 (two) times daily.  . sildenafil (REVATIO) 20 MG tablet Take  2 to 5 tablets as needed once daily prior to erection  . clonazePAM (KLONOPIN) 2 MG tablet Take 1/2 to 1 tablet at bedtime ONLY if needed for Sleep   & please try to limit to 5 days /week to avoid addiction   No current facility-administered medications on file prior to visit.     ROS: all negative except above.   Physical Exam:  BP 116/74   Pulse (!) 56   Temp (!) 97.4 F (36.3 C)   Resp 18   Ht 5' 10.5" (1.791 m)   Wt 182 lb 9.6 oz (82.8 kg)   BMI 25.83 kg/m   General Appearance: Well nourished, in no apparent distress. Eyes: PERRLA,  EOMs, conjunctiva no swelling or erythema ENT/Mouth: No erythema, swelling, or exudate on post pharynx.  Tonsils not swollen or erythematous. Hearing normal.  Neck: Supple.  Respiratory: Respiratory effort normal, BS equal bilaterally without rales, rhonchi, wheezing or stridor.  Cardio: RRR with no MRGs. Brisk peripheral pulses without edema.  Abdomen: Soft, mildly distended, + hyperactive BS throughout.  Non tender, no guarding, rebound, hernias, masses. Lymphatics: Non tender without lymphadenopathy.  Musculoskeletal: Symmetrical strength, normal gait.  Skin: Warm, dry without rashes, lesions, ecchymosis.  Neuro:  Normal muscle tone, no cerebellar symptoms. Psych: Awake and oriented X 3, normal affect, Insight and Judgment appropriate.     Dan MakerAshley C Jettie Mannor, NP 12:03 PM [facility]

## 2017-10-18 NOTE — Patient Instructions (Addendum)
Please stop meloxicam x 2 weeks  We may restart omeprazole/prilosec if there is blood in your stool    Can try the lactulose for constipation Start out with 10mg  or 15 ml once a day, can go up to 30 ml twice a day as needed for constipation Please also make sure you are drinking a lot of water, up to 64-80 oz a day Try to increase your fiber such as whole graines and veggies You can also add a fiber supplement like Citracel or Benefiber, these do not cause gas and bloating and are safe to use.   Walking or moving 20 mins a day can helps as well  If you have severe AB pain, no BM for 3-4 days, or blood in your stool let us know.     Constipation, Adult Constipation is when a person has fewer bowel movements in a week than normal, has difficulty having a bowel movement, or has stools that are dry, hard, or larger than normal. Constipation may be caused by an underlying condition. It may become worse with age if a person takes certain medicines and does not take in enough fluids. Follow these instructions at home: Eating and drinking   Eat foods that have a lot of fiber, such as fresh fruits and vegetables, whole grains, and beans.  Limit foods that are high in fat, low in fiber, or overly processed, such as french fries, hamburgers, cookies, candies, and soda.  Drink enough fluid to keep your urine clear or pale yellow. General instructions  Exercise regularly or as told by your health care provider.  Go to the restroom when you have the urge to go. Do not hold it in.  Take over-the-counter and prescription medicines only as told by your health care provider. These include any fiber supplements.  Practice pelvic floor retraining exercises, such as deep breathing while relaxing the lower abdomen and pelvic floor relaxation during bowel movements.  Watch your condition for any changes.  Keep all follow-up visits as told by your health care provider. This is important. Contact a  health care provider if:  You have pain that gets worse.  You have a fever.  You do not have a bowel movement after 4 days.  You vomit.  You are not hungry.  You lose weight.  You are bleeding from the anus.  You have thin, pencil-like stools. Get help right away if:  You have a fever and your symptoms suddenly get worse.  You leak stool or have blood in your stool.  Your abdomen is bloated.  You have severe pain in your abdomen.  You feel dizzy or you faint. This information is not intended to replace advice given to you by your health care provider. Make sure you discuss any questions you have with your health care provider. Document Released: 11/15/2003 Document Revised: 09/06/2015 Document Reviewed: 08/07/2015 [employer] Interactive Patient Education  2018 [employer]

## 2017-10-19 LAB — COMPLETE METABOLIC PANEL WITH GFR
AG Ratio: 1.3 (calc) (ref 1.0–2.5)
ALBUMIN MSPROF: 4.2 g/dL (ref 3.6–5.1)
ALT: 7 U/L — ABNORMAL LOW (ref 9–46)
AST: 17 U/L (ref 10–35)
Alkaline phosphatase (APISO): 65 U/L (ref 40–115)
BILIRUBIN TOTAL: 0.5 mg/dL (ref 0.2–1.2)
BUN: 15 mg/dL (ref 7–25)
CHLORIDE: 102 mmol/L (ref 98–110)
CO2: 30 mmol/L (ref 20–32)
Calcium: 9.3 mg/dL (ref 8.6–10.3)
Creat: 0.92 mg/dL (ref 0.70–1.11)
GFR, EST AFRICAN AMERICAN: 90 mL/min/{1.73_m2} (ref >=60)
GFR, Est Non African American: 78 mL/min/{1.73_m2} (ref >=60)
GLOBULIN: 3.2 g/dL (ref 1.9–3.7)
Glucose, Bld: 98 mg/dL (ref 65–99)
Potassium: 3.9 mmol/L (ref 3.5–5.3)
SODIUM: 139 mmol/L (ref 135–146)
TOTAL PROTEIN: 7.4 g/dL (ref 6.1–8.1)

## 2017-10-19 LAB — CBC WITH DIFFERENTIAL/PLATELET
BASOS PCT: 1.2 %
Basophils Absolute: 50 cells/uL (ref 0–200)
EOS ABS: 332 {cells}/uL (ref 15–500)
Eosinophils Relative: 7.9 %
HEMATOCRIT: 42.8 % (ref 38.5–50.0)
HEMOGLOBIN: 14.1 g/dL (ref 13.2–17.1)
LYMPHS ABS: 1772 {cells}/uL (ref 850–3900)
MCH: 30.5 pg (ref 27.0–33.0)
MCHC: 32.9 g/dL (ref 32.0–36.0)
MCV: 92.4 fL (ref 80.0–100.0)
MPV: 9.6 fL (ref 7.5–12.5)
Monocytes Relative: 11.7 %
Neutro Abs: 1554 cells/uL (ref 1500–7800)
Neutrophils Relative %: 37 %
Platelets: 248 10*3/uL (ref 140–400)
RBC: 4.63 10*6/uL (ref 4.20–5.80)
RDW: 13.1 % (ref 11.0–15.0)
TOTAL LYMPHOCYTE: 42.2 %
WBC: 4.2 10*3/uL (ref 3.8–10.8)
WBCMIX: 491 {cells}/uL (ref 200–950)

## 2017-10-20 DIAGNOSIS — G4733 Obstructive sleep apnea (adult) (pediatric): Secondary | ICD-10-CM | POA: Diagnosis not present

## 2017-10-21 NOTE — Progress Notes (Signed)
MEDICARE ANNUAL WELLNESS VISIT AND FOLLOW UP Assessment:    Essential hypertension - continue medications, DASH diet, exercise and monitor at home. Call if greater than 130/80.  -     CBC with Differential/Platelet -     COMPLETE METABOLIC PANEL WITH GFR -     TSH  OSA on CPAP Sleep apnea- continue CPAP, CPAP is helping with daytime fatigue, weight loss still advised.   Mixed hyperlipidemia check lipids decrease fatty foods increase activity.  -     Lipid panel  Prediabetes Discussed disease progression and risks Discussed diet/exercise, weight management and risk modification -     Hemoglobin A1c  Medication management -     Magnesium  Palpitations Controlled  Vitamin D deficiency Continue supplement  History of colonic polyps Refer GI  Idiopathic gout, unspecified chronicity, unspecified site Gout- recheck Uric acid as needed, Diet discussed, continue medications.  Gastroesophageal reflux disease with esophagitis Continue PPI/H2 blocker, diet discussed  Encounter for Medicare annual wellness exam 1 year  Constipation, unspecified constipation type -     Ambulatory referral to Gastroenterology --     polyethylene glycol (MIRALAX / GLYCOLAX) packet; Take 17 g by mouth daily. - will refer back to GI for possible colonoscopy  Tarry stool -     POC Hemoccult Bld/Stl (3-Cd Home Screen)- NEGATIVE in the office - get back on prilosec     Future Appointments  Date Time Provider Department Center  12/13/2017  1:30 PM Mansouraty, Netty StarringGabriel Jr., MD [facility]  01/26/2018 10:00 AM Lucky CowboyMcKeown, William, MD [facility] None  11/08/2018  2:00 PM Quentin Mullingollier, Mikele Sifuentes, PA-C [facility] None     Plan:   During the course of the visit the patient was educated and counseled about appropriate screening and preventive services including:    Pneumococcal vaccine   Influenza vaccine  Td vaccine  Screening electrocardiogram  Colorectal cancer screening  Diabetes  screening  Glaucoma screening  Nutrition counseling    Subjective:  Jeremy Boone is a AA 83 y.o. male who presents for Medicare Annual Wellness Visit and 3 month follow up for HTN, hyperlipidemia, prediabetes, and vitamin D Def.   He has had constipation, came in 08/19, given lactulose taking twice a day took it but then 08/23 had watery, peanut butter stool, very light colored stools. He has been having constipation still, AB bloating, AB discomfort.   Last colonoscopy 2009 - showed diverticulosis and few non-adenomatous polyps. He had EGD in 07/2013 which showed erosive esophagitis and has been on prilosec until recently when transitioned to ranitidine 150 mg BID; he denies any GERD/reflux/burning symptoms. He is on meloxicam PRN.   His blood pressure has been controlled at home, today their BP is BP: 122/84 He does workout. He denies chest pain, , dizziness.  He is on cholesterol medication, lopid 600 once daily due to intolerance of statins and denies myalgias. His cholesterol is not at goal. The cholesterol last visit was:   Lab Results  Component Value Date   CHOL 217 (H) 10/26/2017   HDL 66 10/26/2017   LDLCALC 130 (H) 10/26/2017   TRIG 104 10/26/2017   CHOLHDL 3.3 10/26/2017  He has been working on diet and exercise for prediabetes, and denies paresthesia of the feet, polydipsia, polyuria and visual disturbances. Last A1C in the office was:  Lab Results  Component Value Date   HGBA1C 6.0 (H) 10/26/2017  Patient is on Vitamin D supplement.   Lab Results  Component Value Date   VD25OH 63 07/21/2017  He has ED and uses viagra PRN. Patient is on allopurinol for gout and does not report a recent flare.  He is on klonopin as needed for insomnia.  BMI is Body mass index is 25.6 kg/m., he is working on diet and exercise. Wt Readings from Last 3 Encounters:  10/26/17 181 lb (82.1 kg)  10/18/17 182 lb 9.6 oz (82.8 kg)  07/21/17 181 lb 12.8 oz (82.5 kg)     Names of Other  Physician/Practitioners you currently use: 1. [facility] here for primary care Patient Care Team: [name], MD as PCP - General (Internal Medicine) Lamar Benes, MD as Consulting Physician (Ophthalmology)-  May 2018 last visit Drucilla Schmidt, MD as Consulting Physician (Orthopedic Surgery) Louis Meckel, MD as Consulting Physician (Gastroenterology)  Medication Review: Current Outpatient Medications on File Prior to Visit  Medication Sig Dispense Refill  . allopurinol (ZYLOPRIM) 300 MG tablet TAKE 1 TABLET BY MOUTH  EVERY DAY TO PREVENT GOUT 90 tablet 0  . atenolol (TENORMIN) 100 MG tablet TAKE 1 TABLET BY MOUTH  DAILY 90 tablet 0  . Cholecalciferol (VITAMIN D PO) Take 2,000 Int'l Units by mouth 2 (two) times daily.     Marland Kitchen gemfibrozil (LOPID) 600 MG tablet TAKE 1 TABLET BY MOUTH TWO  TIMES DAILY 180 tablet 1  . hydrochlorothiazide (HYDRODIURIL) 25 MG tablet TAKE 1 TABLET BY MOUTH  DAILY (Patient taking differently: TAKE ONE TABLET EVERY TWO DAYS) 90 tablet 1  . lactulose (CHRONULAC) 10 GM/15ML solution Take 10-30 ml up to twice daily as needed for constipation. 240 mL 0  . meloxicam (MOBIC) 15 MG tablet TAKE 1 TABLET BY MOUTH  DAILY WITH FOOD AS NEEDED  FOR PAIN. CAN TAKE WITH  TYLENOL, NOT WITH ALEVE AND IBURPOFEN. 90 tablet 0  . ranitidine (ZANTAC) 150 MG tablet Take 1 tablet (150 mg total) by mouth 2 (two) times daily. 180 tablet 2  . sildenafil (REVATIO) 20 MG tablet Take 2 to 5 tablets as needed once daily prior to erection 90 tablet 3  . clonazePAM (KLONOPIN) 2 MG tablet Take 1/2 to 1 tablet at bedtime ONLY if needed for Sleep   & please try to limit to 5 days /week to avoid addiction 90 tablet 0   No current facility-administered medications on file prior to visit.     Current Problems (verified) Patient Active Problem List   Diagnosis Date Noted  . Palpitations 07/31/2015  . OSA on CPAP 04/19/2015  . Vitamin D deficiency  09/14/2013  . Medication management 09/14/2013  . History of colonic polyps 08/02/2013  . GERD  08/02/2013  . Hyperlipidemia   . Essential hypertension   . Gout   . Prediabetes     Screening Tests Immunization History  Administered Date(s) Administered  . Influenza, High Dose Seasonal PF 12/18/2013, 10/28/2015, 12/10/2016  . Influenza,inj,Quad PF,6+ Mos 03/07/2013  . Pneumococcal Conjugate-13 03/28/2014  . Pneumococcal-Unspecified 03/03/2007  . Td 03/02/2004  . Tdap 10/25/2014  . Zoster 08/26/2016    Preventative care: Last colonoscopy: 10/13/07 DUE 2019, polyps, diverticulosis EGD: 2015 Dr. Arlyce Dice CXR: 2007 CT head 2010 CT lumbar 2009  Prior vaccinations: TD or Tdap: 2016 Influenza: 2017 Pneumococcal: 2009 Prevnar 13: 2016 Shingles/Zostavax: getting today  History reviewed: allergies, current medications, past family history, past medical history, past social history, past surgical history and problem list  Allergies Allergies  Allergen Reactions  . Ace Inhibitors Cough  . Lipitor [Atorvastatin]     Elevates CPK  and Aldolase    SURGICAL HISTORY He  has a past surgical history that includes Carpal tunnel release (Left, 2007); Rotator cuff repair (Left, 2000); Other surgical history (2007); and Cardiac Event Monitor (May-June 2017). FAMILY HISTORY His family history includes Hypertension in his mother; Seizures in his sister; Stroke in his father. SOCIAL HISTORY He  reports that he has never smoked. He has never used smokeless tobacco. He reports that he drinks about 15.0 standard drinks of alcohol per week. He reports that he does not use drugs.  MEDICARE WELLNESS OBJECTIVES: Physical activity: Current Exercise Habits: Home exercise routine, Type of exercise: walking Cardiac risk factors: Cardiac Risk Factors include: advanced age (>74men, >24 women);male gender;sedentary lifestyle;hypertension Depression/mood screen:   Depression screen [facility] 2/9 10/26/2017   Decreased Interest 0  Down, Depressed, Hopeless 0  PHQ - 2 Score 0    ADLs:  In your present state of health, do you have any difficulty performing the following activities: 10/26/2017 07/21/2017  Hearing? N N  Vision? N N  Difficulty concentrating or making decisions? N N  Walking or climbing stairs? N N  Dressing or bathing? N N  Doing errands, shopping? N N  Some recent data might be hidden     Cognitive Testing  Alert? Yes  Normal Appearance?Yes  Oriented to person? Yes  Place? Yes   Time? Yes  Recall of three objects?  Yes  Can perform simple calculations? Yes  Displays appropriate judgment?Yes  Can read the correct time from a watch face?Yes  EOL planning: Does Patient Have a Medical Advance Directive?: No Would patient like information on creating a medical advance directive?: Yes (MAU/Ambulatory/Procedural Areas - Information given)   Objective:   Blood pressure 122/84, pulse 60, temperature 98.2 F (36.8 C), resp. rate 16, height 5' 10.5" (1.791 m), weight 181 lb (82.1 kg), SpO2 97 %. Body mass index is 25.6 kg/m.  General appearance: alert, no distress, WD/WN, male HEENT: normocephalic, sclerae anicteric, TMs pearly, nares patent, no discharge or erythema, pharynx normal Oral cavity: MMM, no lesions Neck: supple, no lymphadenopathy, no thyromegaly, no masses Heart: RRR, normal S1, S2, no murmurs Lungs: CTA bilaterally, no wheezes, rhonchi, or rales Abdomen: +bs, soft, mild diffuse tenderness, non distended, no masses, no hepatomegaly, no splenomegaly Musculoskeletal: nontender, no swelling, no obvious deformity Extremities: no edema, no cyanosis, no clubbing Pulses: 2+ symmetric, upper and lower extremities, normal cap refill Neurological: very slight right mouth twitch, no cog wheeling, alert, oriented x 3, CN2-12 intact, strength normal upper extremities and lower extremities, sensation normal throughout, DTRs 2+ throughout, no cerebellar signs, gait  normal Psychiatric: normal affect, behavior normal, pleasant   Medicare Attestation I have personally reviewed: The patient's medical and social history Their use of alcohol, tobacco or illicit drugs Their current medications and supplements The patient's functional ability including ADLs,fall risks, home safety risks, cognitive, and hearing and visual impairment Diet and physical activities Evidence for depression or mood disorders  The patient's weight, height, BMI, and visual acuity have been recorded in the chart.  I have made referrals, counseling, and provided education to the patient based on review of the above and I have provided the patient with a written personalized care plan for preventive services.     Quentin Mulling, PA-C   10/27/2017

## 2017-10-26 ENCOUNTER — Encounter: Payer: Self-pay | Admitting: Physician Assistant

## 2017-10-26 ENCOUNTER — Ambulatory Visit (INDEPENDENT_AMBULATORY_CARE_PROVIDER_SITE_OTHER): Payer: Medicare Other | Admitting: Physician Assistant

## 2017-10-26 VITALS — BP 122/84 | HR 60 | Temp 98.2°F | Resp 16 | Ht 70.5 in | Wt 181.0 lb

## 2017-10-26 DIAGNOSIS — Z86010 Personal history of colonic polyps: Secondary | ICD-10-CM

## 2017-10-26 DIAGNOSIS — Z79899 Other long term (current) drug therapy: Secondary | ICD-10-CM | POA: Diagnosis not present

## 2017-10-26 DIAGNOSIS — R002 Palpitations: Secondary | ICD-10-CM

## 2017-10-26 DIAGNOSIS — M1000 Idiopathic gout, unspecified site: Secondary | ICD-10-CM

## 2017-10-26 DIAGNOSIS — K2100 Gastro-esophageal reflux disease with esophagitis, without bleeding: Secondary | ICD-10-CM

## 2017-10-26 DIAGNOSIS — E559 Vitamin D deficiency, unspecified: Secondary | ICD-10-CM

## 2017-10-26 DIAGNOSIS — K921 Melena: Secondary | ICD-10-CM | POA: Diagnosis not present

## 2017-10-26 DIAGNOSIS — R6889 Other general symptoms and signs: Secondary | ICD-10-CM | POA: Diagnosis not present

## 2017-10-26 DIAGNOSIS — E782 Mixed hyperlipidemia: Secondary | ICD-10-CM | POA: Diagnosis not present

## 2017-10-26 DIAGNOSIS — Z9989 Dependence on other enabling machines and devices: Secondary | ICD-10-CM

## 2017-10-26 DIAGNOSIS — I10 Essential (primary) hypertension: Secondary | ICD-10-CM

## 2017-10-26 DIAGNOSIS — Z0001 Encounter for general adult medical examination with abnormal findings: Secondary | ICD-10-CM

## 2017-10-26 DIAGNOSIS — Z0000 Encounter for general adult medical examination without abnormal findings: Secondary | ICD-10-CM

## 2017-10-26 DIAGNOSIS — R7303 Prediabetes: Secondary | ICD-10-CM | POA: Diagnosis not present

## 2017-10-26 DIAGNOSIS — G4733 Obstructive sleep apnea (adult) (pediatric): Secondary | ICD-10-CM

## 2017-10-26 DIAGNOSIS — K5900 Constipation, unspecified: Secondary | ICD-10-CM

## 2017-10-26 LAB — POC HEMOCCULT BLD/STL (HOME/3-CARD/SCREEN)
Card #2 Fecal Occult Blod, POC: NEGATIVE
Card #3 Fecal Occult Blood, POC: NEGATIVE
FECAL OCCULT BLD: NEGATIVE

## 2017-10-26 MED ORDER — POLYETHYLENE GLYCOL 3350 17 G PO PACK: 17.0000 g | PACK | Freq: Every day | ORAL | 0 refills | Status: DC

## 2017-10-26 NOTE — Patient Instructions (Signed)
Start on magnesium 500mg  at night Start on miralax that I send in for you to the drug store once a day  Increase water   Constipation, Adult Constipation is when a person has fewer bowel movements in a week than normal, has difficulty having a bowel movement, or has stools that are dry, hard, or larger than normal. Constipation may be caused by an underlying condition. It may become worse with age if a person takes certain medicines and does not take in enough fluids. Follow these instructions at home: Eating and drinking   Eat foods that have a lot of fiber, such as fresh fruits and vegetables, whole grains, and beans.  Limit foods that are high in fat, low in fiber, or overly processed, such as french fries, hamburgers, cookies, candies, and soda.  Drink enough fluid to keep your urine clear or pale yellow. General instructions  Exercise regularly or as told by your health care provider.  Go to the restroom when you have the urge to go. Do not hold it in.  Take over-the-counter and prescription medicines only as told by your health care provider. These include any fiber supplements.  Practice pelvic floor retraining exercises, such as deep breathing while relaxing the lower abdomen and pelvic floor relaxation during bowel movements.  Watch your condition for any changes.  Keep all follow-up visits as told by your health care provider. This is important. Contact a health care provider if:  You have pain that gets worse.  You have a fever.  You do not have a bowel movement after 4 days.  You vomit.  You are not hungry.  You lose weight.  You are bleeding from the anus.  You have thin, pencil-like stools. Get help right away if:  You have a fever and your symptoms suddenly get worse.  You leak stool or have blood in your stool.  Your abdomen is bloated.  You have severe pain in your abdomen.  You feel dizzy or you faint. This information is not intended to  replace advice given to you by your health care provider. Make sure you discuss any questions you have with your health care provider. Document Released: 11/15/2003 Document Revised: 09/06/2015 Document Reviewed: 08/07/2015 Elsevier Interactive Patient Education  2018 [employer]

## 2017-10-27 ENCOUNTER — Encounter: Payer: Self-pay | Admitting: Physician Assistant

## 2017-10-27 ENCOUNTER — Encounter: Payer: Self-pay | Admitting: Gastroenterology

## 2017-10-27 DIAGNOSIS — G4733 Obstructive sleep apnea (adult) (pediatric): Secondary | ICD-10-CM | POA: Diagnosis not present

## 2017-10-27 LAB — CBC WITH DIFFERENTIAL/PLATELET
Basophils Absolute: 48 cells/uL (ref 0–200)
Basophils Relative: 1 %
EOS ABS: 360 {cells}/uL (ref 15–500)
Eosinophils Relative: 7.5 %
HCT: 42.5 % (ref 38.5–50.0)
Hemoglobin: 14.2 g/dL (ref 13.2–17.1)
Lymphs Abs: 2477 cells/uL (ref 850–3900)
MCH: 30.2 pg (ref 27.0–33.0)
MCHC: 33.4 g/dL (ref 32.0–36.0)
MCV: 90.4 fL (ref 80.0–100.0)
MONOS PCT: 10.9 %
MPV: 9.9 fL (ref 7.5–12.5)
NEUTROS PCT: 29 %
Neutro Abs: 1392 cells/uL — ABNORMAL LOW (ref 1500–7800)
Platelets: 250 10*3/uL (ref 140–400)
RBC: 4.7 10*6/uL (ref 4.20–5.80)
RDW: 13.1 % (ref 11.0–15.0)
TOTAL LYMPHOCYTE: 51.6 %
WBC mixed population: 523 cells/uL (ref 200–950)
WBC: 4.8 10*3/uL (ref 3.8–10.8)

## 2017-10-27 LAB — COMPLETE METABOLIC PANEL WITH GFR
AG RATIO: 1.3 (calc) (ref 1.0–2.5)
ALKALINE PHOSPHATASE (APISO): 71 U/L (ref 40–115)
ALT: 7 U/L — AB (ref 9–46)
AST: 20 U/L (ref 10–35)
Albumin: 4.3 g/dL (ref 3.6–5.1)
BILIRUBIN TOTAL: 0.3 mg/dL (ref 0.2–1.2)
BUN: 18 mg/dL (ref 7–25)
CHLORIDE: 100 mmol/L (ref 98–110)
CO2: 29 mmol/L (ref 20–32)
Calcium: 9.6 mg/dL (ref 8.6–10.3)
Creat: 0.96 mg/dL (ref 0.70–1.11)
GFR, Est African American: 86 mL/min/{1.73_m2} (ref >=60)
GFR, Est Non African American: 74 mL/min/{1.73_m2} (ref >=60)
GLUCOSE: 86 mg/dL (ref 65–99)
Globulin: 3.3 g/dL (calc) (ref 1.9–3.7)
POTASSIUM: 4.1 mmol/L (ref 3.5–5.3)
Sodium: 138 mmol/L (ref 135–146)
Total Protein: 7.6 g/dL (ref 6.1–8.1)

## 2017-10-27 LAB — LIPID PANEL
CHOLESTEROL: 217 mg/dL — AB (ref <200)
HDL: 66 mg/dL (ref >40)
LDL CHOLESTEROL (CALC): 130 mg/dL — AB
Non-HDL Cholesterol (Calc): 151 mg/dL (calc) — ABNORMAL HIGH (ref <130)
TRIGLYCERIDES: 104 mg/dL (ref <150)
Total CHOL/HDL Ratio: 3.3 (calc) (ref <5.0)

## 2017-10-27 LAB — MAGNESIUM: MAGNESIUM: 2 mg/dL (ref 1.5–2.5)

## 2017-10-27 LAB — HEMOGLOBIN A1C
Hgb A1c MFr Bld: 6 % of total Hgb — ABNORMAL HIGH (ref <5.7)
MEAN PLASMA GLUCOSE: 126 (calc)
eAG (mmol/L): 7 (calc)

## 2017-10-27 LAB — TSH: TSH: 2.54 mIU/L (ref 0.40–4.50)

## 2017-11-01 ENCOUNTER — Other Ambulatory Visit: Payer: Self-pay | Admitting: Internal Medicine

## 2017-11-01 ENCOUNTER — Other Ambulatory Visit: Payer: Self-pay | Admitting: Physician Assistant

## 2017-11-09 ENCOUNTER — Other Ambulatory Visit: Payer: Self-pay | Admitting: Internal Medicine

## 2017-11-09 DIAGNOSIS — G4700 Insomnia, unspecified: Secondary | ICD-10-CM

## 2017-11-09 MED ORDER — CLONAZEPAM 2 MG PO TABS: ORAL_TABLET | ORAL | 0 refills | Status: DC

## 2017-11-27 DIAGNOSIS — G4733 Obstructive sleep apnea (adult) (pediatric): Secondary | ICD-10-CM | POA: Diagnosis not present

## 2017-12-13 ENCOUNTER — Other Ambulatory Visit (INDEPENDENT_AMBULATORY_CARE_PROVIDER_SITE_OTHER): Payer: Medicare Other

## 2017-12-13 ENCOUNTER — Ambulatory Visit (INDEPENDENT_AMBULATORY_CARE_PROVIDER_SITE_OTHER): Payer: Medicare Other | Admitting: Gastroenterology

## 2017-12-13 ENCOUNTER — Encounter: Payer: Self-pay | Admitting: Gastroenterology

## 2017-12-13 VITALS — BP 110/78 | HR 60 | Ht 70.0 in | Wt 178.0 lb

## 2017-12-13 DIAGNOSIS — K5900 Constipation, unspecified: Secondary | ICD-10-CM | POA: Diagnosis not present

## 2017-12-13 DIAGNOSIS — Z1211 Encounter for screening for malignant neoplasm of colon: Secondary | ICD-10-CM

## 2017-12-13 DIAGNOSIS — K219 Gastro-esophageal reflux disease without esophagitis: Secondary | ICD-10-CM | POA: Diagnosis not present

## 2017-12-13 LAB — IBC PANEL
IRON: 104 ug/dL (ref 42–165)
SATURATION RATIOS: 28 % (ref 20.0–50.0)
Transferrin: 265 mg/dL (ref 212.0–360.0)

## 2017-12-13 LAB — CBC
HCT: 43.8 % (ref 39.0–52.0)
Hemoglobin: 14.7 g/dL (ref 13.0–17.0)
MCHC: 33.5 g/dL (ref 30.0–36.0)
MCV: 92.8 fl (ref 78.0–100.0)
PLATELETS: 241 10*3/uL (ref 150.0–400.0)
RBC: 4.72 Mil/uL (ref 4.22–5.81)
RDW: 14.1 % (ref 11.5–15.5)
WBC: 5 10*3/uL (ref 4.0–10.5)

## 2017-12-13 LAB — FERRITIN: Ferritin: 112.5 ng/mL (ref 22.0–322.0)

## 2017-12-13 LAB — TSH: TSH: 2.24 u[IU]/mL (ref 0.35–4.50)

## 2017-12-13 NOTE — Patient Instructions (Addendum)
If you are age 81 or older, your body mass index should be between 23-30. Your Body mass index is 25.54 kg/m. If this is out of the aforementioned range listed, please consider follow up with your Primary Care Provider.  If you are age 46 or younger, your body mass index should be between 19-25. Your Body mass index is 25.54 kg/m. If this is out of the aformentioned range listed, please consider follow up with your Primary Care Provider.   Your provider has requested that you go to the basement level for lab work before leaving today. Press "B" on the elevator. The lab is located at the first door on the left as you exit the elevator.  Follow up with me in eight weeks. Please call the office for an appointment in a couple of weeks.  The schedule should be available at that time.  Thank you for choosing me and [facility].   Corliss Parish, MD

## 2017-12-13 NOTE — Progress Notes (Signed)
GASTROENTEROLOGY OUTPATIENT CLINIC VISIT   Primary Care Provider [name], MD [address] [place] [address] [phone]  Referring Provider [name], MD [address] [place], [place] [address] [phone]  Patient Profile: Jeremy Boone is a 83 y.o. male with a pmh significant for HTN, HLD, Gout, Prediabetes.  The patient presents to the [facility] for an evaluation and management of problem(s) noted below:  Problem List 1. Constipation, unspecified constipation type   2. Colon cancer screening   3. Gastroesophageal reflux disease, esophagitis presence not specified     History of Present Illness: This is the patient's first visit to the outpatient [facility], though he has been seen for procedures in the past.  Colonoscopy in 2009 which showed some diverticulosis of the left and right side as well as a hot biopsy forceps and returned as hyperplastic.  He has also had an upper endoscopy performed in 2015 that showed a patulous esophagus with some erosive esophagitis being noted as well.  The patient has done extremely well over the course the last few years.  He describes long-standing issues with constipation for many years.  E will very infrequently have some abdominal bloating but does not have any abdominal pain.  Describes an interesting symptom of postprandial early satiety that occurs once per week.  He can eat particular meal on one day and feels very full and be unable to complete that but may have a seen me over the next day and not have any issues or symptoms develop.  He did not have any actual nausea does not have any regards to his constipation he describes missing a bowel movement every 2 weeks rather than going on a daily basis.  He uses Citrucel daily up to 2 times per day and has increase his water intake in an effort to try and have more consistent bowel movements this does help.  Every  couple of months he will have a dark stool which lasts for 1 day and then improves the next day.  He does not describe it as dark or tarry.  However he has been on nonsteroidals and will usually stop those around the time.  Of that episode of darker stool.  He has been prescribed omeprazole in the past as well as ranitidine.  He takes ranitidine on an as-needed basis for heartburn.  He describes no dysphagia or odynophagia.  He describes a desire to stop colon cancer screening if possible.  GI Review of Systems Positive as above Negative for epigastric pain, urgency, hematochezia  Review of Systems General: Denies fevers/chills/weight loss HEENT: Denies oral lesions Cardiovascular: Denies chest pain Pulmonary: Denies shortness of breath Gastroenterological: See HPI Genitourinary: Denies darkened urine Hematological: Denies easy bruising Endocrine: Denies temperature intolerance Dermatological: Denies skin changes Psychological: Mood is stable Musculoskeletal: Denies new arthralgias   Medications Current Outpatient Medications  Medication Sig Dispense Refill  . allopurinol (ZYLOPRIM) 300 MG tablet TAKE 1 TABLET BY MOUTH  EVERY DAY TO PREVENT GOUT 90 tablet 1  . atenolol (TENORMIN) 100 MG tablet TAKE 1 TABLET BY MOUTH  DAILY 90 tablet 1  . Cholecalciferol (VITAMIN D PO) Take 2,000 Int'l Units by mouth 2 (two) times daily.     . clonazePAM (KLONOPIN) 2 MG tablet Take 1/2 to 1 tablet at bedtime ONLY if needed for Sleep   & please try to limit to 5 days /week to avoid addiction 90 tablet 0  . gemfibrozil (LOPID) 600 MG  tablet TAKE 1 TABLET BY MOUTH TWO  TIMES DAILY 180 tablet 1  . hydrochlorothiazide (HYDRODIURIL) 25 MG tablet TAKE 1 TABLET BY MOUTH  DAILY 90 tablet 1  . lactulose (CHRONULAC) 10 GM/15ML solution Take 10-30 ml up to twice daily as needed for constipation. 240 mL 0  . meloxicam (MOBIC) 15 MG tablet TAKE 1 TABLET BY MOUTH  DAILY WITH FOOD AS NEEDED  FOR PAIN. CAN TAKE WITH   TYLENOL, NOT WITH ALEVE AND IBURPOFEN. 90 tablet 0  . polyethylene glycol (MIRALAX / GLYCOLAX) packet Take 17 g by mouth daily. 14 each 0  . ranitidine (ZANTAC) 150 MG tablet Take 1 tablet (150 mg total) by mouth 2 (two) times daily. 180 tablet 2  . sildenafil (REVATIO) 20 MG tablet Take 2 to 5 tablets as needed once daily prior to erection 90 tablet 3   No current facility-administered medications for this visit.     Allergies Allergies  Allergen Reactions  . Ace Inhibitors Cough  . Lipitor [Atorvastatin]     Elevates CPK and Aldolase    Histories Past Medical History:  Diagnosis Date  . Gout   . Hyperlipidemia   . Hypertension   . Prediabetes    Past Surgical History:  Procedure Laterality Date  . Cardiac Event Monitor  May-June 2017   Sinus rhythm with average heart rate 74.2 - no notable arrhythmias. Minimal PVCs. No PACs.  . CARPAL TUNNEL RELEASE Left 2007  . OTHER SURGICAL HISTORY  2007   Negative biopsy tonsil mass  . ROTATOR CUFF REPAIR Left 2000   Social History   Socioeconomic History  . Marital status: Married    Spouse name: Not on file  . Number of children: Not on file  . Years of education: Not on file  . Highest education level: Not on file  Occupational History  . Not on file  Social Needs  . Financial resource strain: Not on file  . Food insecurity:    Worry: Not on file    Inability: Not on file  . Transportation needs:    Medical: Not on file    Non-medical: Not on file  Tobacco Use  . Smoking status: Never Smoker  . Smokeless tobacco: Never Used  Substance and Sexual Activity  . Alcohol use: Yes    Alcohol/week: 15.0 standard drinks    Types: 15 Standard drinks or equivalent per week    Comment: every day 2-3 drimks a day   . Drug use: No  . Sexual activity: Not on file  Lifestyle  . Physical activity:    Days per week: Not on file    Minutes per session: Not on file  . Stress: Not on file  Relationships  . Social connections:     Talks on phone: Not on file    Gets together: Not on file    Attends religious service: Not on file    Active member of club or organization: Not on file    Attends meetings of clubs or organizations: Not on file    Relationship status: Not on file  . Intimate partner violence:    Fear of current or ex partner: Not on file    Emotionally abused: Not on file    Physically abused: Not on file    Forced sexual activity: Not on file  Other Topics Concern  . Not on file  Social History Narrative  . Not on file   Family History  Problem Relation Age of Onset  .  Hypertension Mother   . Stroke Father   . Seizures Sister   . Colon cancer Neg Hx   . Stomach cancer Neg Hx   . Esophageal cancer Neg Hx   . Inflammatory bowel disease Neg Hx   . Liver disease Neg Hx   . Pancreatic cancer Neg Hx   . Rectal cancer Neg Hx    I have reviewed his medical, social, and family history in detail and updated the electronic medical record as necessary.    PHYSICAL EXAMINATION  BP 110/78   Pulse 60   Ht 5\' 10"  (1.778 m)   Wt 178 lb (80.7 kg)   BMI 25.54 kg/m  Wt Readings from Last 3 Encounters:  12/13/17 178 lb (80.7 kg)  10/26/17 181 lb (82.1 kg)  10/18/17 182 lb 9.6 oz (82.8 kg)  GEN: NAD, appears stated age, doesn't appear chronically ill PSYCH: Cooperative, without pressured speech EYE: Conjunctivae pink, sclerae anicteric ENT: MMM, without oral ulcers, no erythema or exudates noted NECK: Supple CV: RR without R/Gs  RESP: CTAB posteriorly, without wheezing GI: NABS, soft, ventral diastases, NT, without rebound or guarding, no HSM appreciated MSK/EXT: No lower extremity edema SKIN: No jaundice NEURO:  Alert & Oriented x 3, no focal deficits   REVIEW OF DATA  I reviewed the following data at the time of this encounter:  GI Procedures and Studies  2015 EGD The upper, middle and distal third of the esophagus were carefully inspected. There was at least one erosion extending 2  cm proximally from the GE junction.. The esophagus may have been somewhat patulous. The z-line was well seen at the GEJ. The endoscope was pushed into the fundus which was normal including a retroflexed view. The antrum, gastric body, first and second part of the duodenum were unremarkable. Retroflexed views revealed no abnormalities. The scope was then withdrawn from the patient and the procedure completed. COMPLICATIONS: There were no complications. ENDOSCOPIC IMPRESSION: 1. erosive esophagitis 2. ? patulous esophagus)  2009 Colonoscopy Left/Right Diverticulosis 1 AC Polyp s/p hot biopsy forceps - Hyperplastic  Laboratory Studies  Reviewed in epic  Imaging Studies  2015 Barium Esophagram IMPRESSION: Hiatal hernia with gastroesophageal reflux. Mild esophageal Dysmotility.   ASSESSMENT  Mr. Kirkpatrick is a 83 y.o. male with a pmh significant for HTN, HLD, Gout, Prediabetes.  The patient is seen today for evaluation and management of:  1. Constipation, unspecified constipation type   2. Colon cancer screening   3. Gastroesophageal reflux disease, esophagitis presence not specified    This is a clinically and hemodynamically stable patient who presents for consideration of continued colon cancer screening as well as longer standing issues of heartburn as well as constipation.  His symptoms do not seem to be obstructive in nature and have been longer standing over the years.  His last colonoscopy in 2009 did not suggest any obstructive lesions or masses.  We did discuss in depth that he is relatively healthy with a planned life expectancy of at least 10 to 15 years and thus even though he would be age 23 I would consider continued colon cancer screening.  With that being said the patient would like to defer on this unless absolutely indicated.  We described that if his constipation symptoms were to worsen and/or he were to develop issues of rectal bleeding that we would have to consider an  endoscopic evaluation as a diagnostic procedure.  At this point he would like to follow that.  I do think it would  be worthwhile to obtain a blood count as well as iron indices because he did have any evidence of iron deficiency I would be more strongly pressed to try and perform a colonoscopy for screening purposes his acid reflux is intermittent in nature and he is currently only taking Zantac as needed.  We did discuss continuing this on an as-needed basis.  If his heartburn symptoms were to worsen he would go back on omeprazole and he would alert [place] so that we could consider an upper endoscopy to evaluate and ensure that there is no other changes though with a recent EGD within the last 5 years likelihood of developing a that I agree with continued fiber supplementation for his constipation was the use of MiraLAX on a daily if not every other day basis as needed.  If he has more significant issues we can pursue other medication for his constipation.  All patient questions were answered, to the best of my ability, and the patient agrees to the aforementioned plan of action with follow-up as indicated.   PLAN  1. Colon cancer screening - Although I feel he is still a candidate for colon cancer screening the patient defers on this and would like to stop all colon cancer screening unless something changes in his history, which I think is reasonable and he understands the implications of ending screening - CBC; Future - IBC panel; Future - Ferritin; Future  2. Constipation, unspecified constipation type - TSH; Future - Continue fiber supplementation twice daily - May continue Miralax QD-BID - Continue water intake - If issues persist will attempt use of other medications and then likely consider a colonoscopy  3. Gastroesophageal reflux disease, esophagitis presence not specified - Continue Ranitidine PRN - Omeprazole removed from list per patient he is not taking - If symptoms progress or recur  more frequently he will require strong consideration of EGD   Orders Placed This Encounter  Procedures  . CBC  . TSH  . IBC panel  . Ferritin    New Prescriptions   No medications on file   Modified Medications   No medications on file    Planned Follow Up: No follow-ups on file.   Corliss Parish, MD [facility] Office # [phone]

## 2017-12-15 ENCOUNTER — Encounter: Payer: Self-pay | Admitting: Gastroenterology

## 2017-12-15 DIAGNOSIS — K5900 Constipation, unspecified: Secondary | ICD-10-CM | POA: Insufficient documentation

## 2017-12-27 DIAGNOSIS — G4733 Obstructive sleep apnea (adult) (pediatric): Secondary | ICD-10-CM | POA: Diagnosis not present

## 2018-01-03 ENCOUNTER — Other Ambulatory Visit: Payer: Self-pay | Admitting: Internal Medicine

## 2018-01-06 ENCOUNTER — Encounter: Payer: Self-pay | Admitting: Internal Medicine

## 2018-01-06 DIAGNOSIS — G4733 Obstructive sleep apnea (adult) (pediatric): Secondary | ICD-10-CM | POA: Diagnosis not present

## 2018-01-13 DIAGNOSIS — H04123 Dry eye syndrome of bilateral lacrimal glands: Secondary | ICD-10-CM | POA: Diagnosis not present

## 2018-01-26 ENCOUNTER — Encounter: Payer: Self-pay | Admitting: Internal Medicine

## 2018-01-26 ENCOUNTER — Other Ambulatory Visit: Payer: Self-pay | Admitting: *Deleted

## 2018-01-26 ENCOUNTER — Ambulatory Visit (INDEPENDENT_AMBULATORY_CARE_PROVIDER_SITE_OTHER): Payer: Medicare Other | Admitting: Internal Medicine

## 2018-01-26 VITALS — BP 112/68 | HR 60 | Temp 97.4°F | Resp 16 | Ht 71.0 in | Wt 181.8 lb

## 2018-01-26 DIAGNOSIS — E559 Vitamin D deficiency, unspecified: Secondary | ICD-10-CM

## 2018-01-26 DIAGNOSIS — Z136 Encounter for screening for cardiovascular disorders: Secondary | ICD-10-CM | POA: Diagnosis not present

## 2018-01-26 DIAGNOSIS — Z23 Encounter for immunization: Secondary | ICD-10-CM | POA: Diagnosis not present

## 2018-01-26 DIAGNOSIS — M1000 Idiopathic gout, unspecified site: Secondary | ICD-10-CM

## 2018-01-26 DIAGNOSIS — Z1211 Encounter for screening for malignant neoplasm of colon: Secondary | ICD-10-CM

## 2018-01-26 DIAGNOSIS — G4733 Obstructive sleep apnea (adult) (pediatric): Secondary | ICD-10-CM

## 2018-01-26 DIAGNOSIS — Z8249 Family history of ischemic heart disease and other diseases of the circulatory system: Secondary | ICD-10-CM

## 2018-01-26 DIAGNOSIS — Z125 Encounter for screening for malignant neoplasm of prostate: Secondary | ICD-10-CM

## 2018-01-26 DIAGNOSIS — E782 Mixed hyperlipidemia: Secondary | ICD-10-CM

## 2018-01-26 DIAGNOSIS — R7309 Other abnormal glucose: Secondary | ICD-10-CM

## 2018-01-26 DIAGNOSIS — Z0000 Encounter for general adult medical examination without abnormal findings: Secondary | ICD-10-CM

## 2018-01-26 DIAGNOSIS — I739 Peripheral vascular disease, unspecified: Secondary | ICD-10-CM

## 2018-01-26 DIAGNOSIS — Z9989 Dependence on other enabling machines and devices: Secondary | ICD-10-CM

## 2018-01-26 DIAGNOSIS — I10 Essential (primary) hypertension: Secondary | ICD-10-CM | POA: Diagnosis not present

## 2018-01-26 DIAGNOSIS — Z0001 Encounter for general adult medical examination with abnormal findings: Secondary | ICD-10-CM

## 2018-01-26 DIAGNOSIS — G4700 Insomnia, unspecified: Secondary | ICD-10-CM

## 2018-01-26 DIAGNOSIS — R7303 Prediabetes: Secondary | ICD-10-CM | POA: Diagnosis not present

## 2018-01-26 DIAGNOSIS — N138 Other obstructive and reflux uropathy: Secondary | ICD-10-CM

## 2018-01-26 DIAGNOSIS — Z79899 Other long term (current) drug therapy: Secondary | ICD-10-CM

## 2018-01-26 DIAGNOSIS — Z1212 Encounter for screening for malignant neoplasm of rectum: Secondary | ICD-10-CM

## 2018-01-26 DIAGNOSIS — K2100 Gastro-esophageal reflux disease with esophagitis, without bleeding: Secondary | ICD-10-CM

## 2018-01-26 DIAGNOSIS — N401 Enlarged prostate with lower urinary tract symptoms: Secondary | ICD-10-CM

## 2018-01-26 MED ORDER — ATENOLOL 100 MG PO TABS: 100.0000 mg | ORAL_TABLET | Freq: Every day | ORAL | 1 refills | Status: DC

## 2018-01-26 MED ORDER — GABAPENTIN 300 MG PO CAPS: ORAL_CAPSULE | ORAL | 1 refills | Status: DC

## 2018-01-26 NOTE — Patient Instructions (Signed)

## 2018-01-26 NOTE — Progress Notes (Signed)
[facility]   [name], M.D.     Dyanne Carrel. Steffanie Dunn, P.A.-C Judd Gaudier, DNP [facility]                [address]                [place], [place]. [address] Telephone [phone] Telefax [phone] Annual  Screening/Preventative Visit  & Comprehensive Evaluation & Examination     This very nice 83 y.o. MBM from [place]   presents for a Screening /Preventative Visit & comprehensive evaluation and management of  HTN, HLD, Prediabetes and Vitamin D Deficiency. Patient also is on CPAP for OSA with improved sleep hygiene. Patient has Gout controlled on his Allopurinol and also GERD controlled on Prilosec.      HTN predates circa 1998. Patient's BP has been controlled and today's BP is at goal  - 112/68. Patient denies any cardiac symptoms as chest pain, palpitations, shortness of breath, dizziness or ankle swelling.     Patient is Statin intolerant and his lipids are not controlled with diet and Lopid. Patient denies myalgias or other medication SE's. Last lipids were not at goal:  Lab Results  Component Value Date   CHOL 217 (H) 10/26/2017   HDL 66 10/26/2017   LDLCALC 130 (H) 10/26/2017   TRIG 104 10/26/2017   CHOLHDL 3.3 10/26/2017      Patient has hx/o prediabetes  (A1c 6.3% / 2011)  and patient denies reactive hypoglycemic symptoms, visual blurring, diabetic polys or paresthesias. Last A1c was not at goal: Lab Results  Component Value Date   HGBA1C 6.0 (H) 10/26/2017       Finally, patient has history of Vitamin D Deficiency ("25" / 2008)  and last vitamin D was at goal: Lab Results  Component Value Date   VD25OH 63 07/21/2017   Current Outpatient Medications on File Prior to Visit  Medication Sig  . allopurinol (ZYLOPRIM) 300 MG tablet TAKE 1 TABLET BY MOUTH  EVERY DAY TO PREVENT GOUT  . Cholecalciferol (VITAMIN D PO) Take 2,000 Int'l Units by mouth 2 (two) times daily.   . clonazePAM (KLONOPIN)  2 MG tablet Take 1/2 to 1 tablet at bedtime ONLY if needed for Sleep   & please try to limit to 5 days /week to avoid addiction  . gemfibrozil (LOPID) 600 MG tablet TAKE 1 TABLET BY MOUTH TWO  TIMES DAILY  . hydrochlorothiazide (HYDRODIURIL) 25 MG tablet TAKE 1 TABLET BY MOUTH  DAILY  . lactulose (CHRONULAC) 10 GM/15ML solution Take 10-30 ml up to twice daily as needed for constipation.  Marland Kitchen omeprazole (PRILOSEC) 40 MG capsule Take 40 mg by mouth daily.  . polyethylene glycol (MIRALAX / GLYCOLAX) packet Take 17 g by mouth daily.  . ranitidine (ZANTAC) 150 MG tablet TAKE 1 TABLET BY MOUTH TWO  TIMES DAILY  . sildenafil (REVATIO) 20 MG tablet Take 2 to 5 tablets as needed once daily prior to erection   No current facility-administered medications on file prior to visit.    Allergies  Allergen Reactions  . Ace Inhibitors Cough  . Lipitor [Atorvastatin]     Elevates CPK and Aldolase   Past Medical History:  Diagnosis Date  . Gout   . Hyperlipidemia   . Hypertension   . Prediabetes    Health Maintenance  Topic Date Due  . INFLUENZA VACCINE  09/30/2017  . TETANUS/TDAP  10/24/2024  . PNA vac Low Risk Adult  Completed  Immunization History  Administered Date(s) Administered  . Influenza, High Dose Seasonal PF 12/18/2013, 10/28/2015, 12/10/2016  . Influenza,inj,Quad PF,6+ Mos 03/07/2013  . Pneumococcal Conjugate-13 03/28/2014  . Pneumococcal-Unspecified 03/03/2007  . Td 03/02/2004  . Tdap 10/25/2014  . Zoster 08/26/2016   Last Colon - 10/13/2007 - Dr Arlyce Dice - hyperplastic polyps  Past Surgical History:  Procedure Laterality Date  . Cardiac Event Monitor  May-June 2017   Sinus rhythm with average heart rate 74.2 - no notable arrhythmias. Minimal PVCs. No PACs.  . CARPAL TUNNEL RELEASE Left 2007  . OTHER SURGICAL HISTORY  2007   Negative biopsy tonsil mass  . ROTATOR CUFF REPAIR Left 2000   Family History  Problem Relation Age of Onset  . Hypertension Mother   . Stroke  Father   . Seizures Sister   . Colon cancer Neg Hx   . Stomach cancer Neg Hx   . Esophageal cancer Neg Hx   . Inflammatory bowel disease Neg Hx   . Liver disease Neg Hx   . Pancreatic cancer Neg Hx   . Rectal cancer Neg Hx    Social History   Socioeconomic History  . Marital status: Married    Spouse name: Mary   . Number of children: 1 son  Occupational History  . Retired   Tobacco Use  . Smoking status: Never Smoker  . Smokeless tobacco: Never Used    ROS Constitutional: Denies fever, chills, weight loss/gain, headaches, insomnia,  night sweats or change in appetite. Does c/o fatigue. Eyes: Denies redness, blurred vision, diplopia, discharge, itchy or watery eyes.  ENT: Denies discharge, congestion, post nasal drip, epistaxis, sore throat, earache, hearing loss, dental pain, Tinnitus, Vertigo, Sinus pain or snoring.  Cardio: Denies chest pain, palpitations, irregular heartbeat, syncope, dyspnea, diaphoresis, orthopnea, PND, claudication or edema Respiratory: denies cough, dyspnea, DOE, pleurisy, hoarseness, laryngitis or wheezing.  Gastrointestinal: Denies dysphagia, heartburn, reflux, water brash, pain, cramps, nausea, vomiting, bloating, diarrhea, constipation, hematemesis, melena, hematochezia, jaundice or hemorrhoids Genitourinary: Denies dysuria, frequency, urgency, nocturia, hesitancy, discharge, hematuria or flank pain Musculoskeletal: Denies arthralgia, myalgia, stiffness, Jt. Swelling, pain, limp or strain/sprain. Denies Falls. Skin: Denies puritis, rash, hives, warts, acne, eczema or change in skin lesion Neuro: No weakness, tremor, incoordination, spasms, paresthesia or pain Psychiatric: Denies confusion, memory loss or sensory loss. Denies Depression. Endocrine: Denies change in weight, skin, hair change, nocturia, and paresthesia, diabetic polys, visual blurring or hyper / hypo glycemic episodes.  Heme/Lymph: No excessive bleeding, bruising or enlarged lymph  nodes.  Physical Exam  BP 112/68   Pulse 60   Temp (!) 97.4 F (36.3 C)   Resp 16   Ht 5\' 11"  (1.803 m)   Wt 181 lb 12.8 oz (82.5 kg)   BMI 25.36 kg/m   General Appearance: Well nourished and well groomed and in no apparent distress.  Eyes: PERRLA, EOMs, conjunctiva no swelling or erythema, normal fundi and vessels. Sinuses: No frontal/maxillary tenderness ENT/Mouth: EACs patent / TMs  nl. Nares clear without erythema, swelling, mucoid exudates. Oral hygiene is good. No erythema, swelling, or exudate. Tongue normal, non-obstructing. Tonsils not swollen or erythematous. Hearing normal.  Neck: Supple, thyroid not palpable. No bruits, nodes or JVD. Respiratory: Respiratory effort normal.  BS equal and clear bilateral without rales, rhonci, wheezing or stridor. Cardio: Heart sounds are normal with regular rate and rhythm and no murmurs, rubs or gallops. Peripheral pulses are 1+ and equal bilaterally without edema. No aortic or femoral bruits. Chest: symmetric with normal excursions  and percussion.  Abdomen: Soft, with Nl bowel sounds. Nontender, no guarding, rebound, hernias, masses, or organomegaly.  Lymphatics: Non tender without lymphadenopathy.  Musculoskeletal: Full ROM all peripheral extremities, joint stability, 5/5 strength, and normal gait. Skin: Warm and dry without rashes, lesions, cyanosis, clubbing or  ecchymosis.  Neuro: Cranial nerves intact, reflexes equal bilaterally. Normal muscle tone, no cerebellar symptoms. Sensation intact.  Pysch: Alert and oriented X 3 with normal affect, insight and judgment appropriate.   Assessment and Plan  1. Annual Preventative/Screening Exam   2. Essential hypertension  - EKG 12-Lead - US, RETROPERITNL ABD,  LTD - Urinalysis, Routine w reflex microscopic - Microalbumin / creatinine urine ratio - COMPLETE METABOLIC PANEL WITH GFR - Magnesium - Lipid panel - TSH  3. Hyperlipidemia, mixed  - EKG 12-Lead - US, RETROPERITNL ABD,   LTD - TSH  4. Abnormal glucose  - EKG 12-Lead - US, RETROPERITNL ABD,  LTD - Hemoglobin A1c - Insulin, random  5. Vitamin D deficiency  - VITAMIN D 25 Hydroxy  6. Peripheral vascular disease (HCC)  - VAS US LOWER EXTREMITY ARTERIAL DUPLEX; Future  7. Idiopathic gout  - Uric acid  8. Gastroesophageal reflux disease  - CBC with Differential/Platelet  9. OSA on CPAP   10. BPH with obstruction/lower urinary tract symptoms  - PSA  11. Prediabetes  - EKG 12-Lead - US, RETROPERITNL ABD,  LTD - Hemoglobin A1c - Insulin, random  12. Insomnia, unspecified type   13. Screening for colorectal cancer  - POC Hemoccult Bld/Stl  14. Prostate cancer screening  - PSA  15. Screening for ischemic heart disease  - EKG 12-Lead  16. FH: hypertension  - EKG 12-Lead - US, RETROPERITNL ABD,  LTD  17. Screening for AAA (aortic abdominal aneurysm)  - US, RETROPERITNL ABD,  LTD  18. Medication management  - Urinalysis, Routine w reflex microscopic - Microalbumin / creatinine urine ratio - CBC with Differential/Platelet - COMPLETE METABOLIC PANEL WITH GFR - Magnesium - Lipid panel - TSH - Hemoglobin A1c - Insulin, random - VITAMIN D 25 Hydroxy (Vit-D Deficiency, Fractures)  19. Need for prophylactic vaccination and inoculation against influenza  - Flu vaccine HIGH DOSE PF (Fluzone High dose)         Patient was counseled in prudent diet, weight control to achieve/maintain BMI less than 25, BP monitoring, regular exercise and medications as discussed.  Discussed med effects and SE's. Routine screening labs and tests as requested with regular follow-up as recommended. Over 40 minutes of exam, counseling, chart review and high complex critical decision making was performed

## 2018-01-27 DIAGNOSIS — G4733 Obstructive sleep apnea (adult) (pediatric): Secondary | ICD-10-CM | POA: Diagnosis not present

## 2018-01-28 LAB — COMPLETE METABOLIC PANEL WITH GFR
AG RATIO: 1.3 (calc) (ref 1.0–2.5)
ALBUMIN MSPROF: 4.3 g/dL (ref 3.6–5.1)
ALT: 7 U/L — ABNORMAL LOW (ref 9–46)
AST: 18 U/L (ref 10–35)
Alkaline phosphatase (APISO): 61 U/L (ref 40–115)
BILIRUBIN TOTAL: 0.4 mg/dL (ref 0.2–1.2)
BUN: 19 mg/dL (ref 7–25)
CALCIUM: 9.5 mg/dL (ref 8.6–10.3)
CHLORIDE: 101 mmol/L (ref 98–110)
CO2: 30 mmol/L (ref 20–32)
Creat: 0.94 mg/dL (ref 0.70–1.11)
GFR, Est African American: 88 mL/min/{1.73_m2} (ref >=60)
GFR, Est Non African American: 76 mL/min/{1.73_m2} (ref >=60)
Globulin: 3.4 g/dL (calc) (ref 1.9–3.7)
Glucose, Bld: 78 mg/dL (ref 65–99)
Potassium: 4.2 mmol/L (ref 3.5–5.3)
Sodium: 138 mmol/L (ref 135–146)
TOTAL PROTEIN: 7.7 g/dL (ref 6.1–8.1)

## 2018-01-28 LAB — LIPID PANEL
CHOLESTEROL: 234 mg/dL — AB (ref <200)
HDL: 79 mg/dL (ref >40)
LDL Cholesterol (Calc): 138 mg/dL (calc) — ABNORMAL HIGH
Non-HDL Cholesterol (Calc): 155 mg/dL (calc) — ABNORMAL HIGH (ref <130)
Total CHOL/HDL Ratio: 3 (calc) (ref <5.0)
Triglycerides: 71 mg/dL (ref <150)

## 2018-01-28 LAB — URINALYSIS, ROUTINE W REFLEX MICROSCOPIC
Bilirubin Urine: NEGATIVE
Glucose, UA: NEGATIVE
HGB URINE DIPSTICK: NEGATIVE
KETONES UR: NEGATIVE
Leukocytes, UA: NEGATIVE
NITRITE: NEGATIVE
Protein, ur: NEGATIVE
Specific Gravity, Urine: 1.017 (ref 1.001–1.03)
pH: 7.5 (ref 5.0–8.0)

## 2018-01-28 LAB — CBC WITH DIFFERENTIAL/PLATELET
BASOS PCT: 1.2 %
Basophils Absolute: 50 cells/uL (ref 0–200)
Eosinophils Absolute: 340 cells/uL (ref 15–500)
Eosinophils Relative: 8.1 %
HCT: 43.1 % (ref 38.5–50.0)
HEMOGLOBIN: 14.3 g/dL (ref 13.2–17.1)
LYMPHS ABS: 2079 {cells}/uL (ref 850–3900)
MCH: 30.2 pg (ref 27.0–33.0)
MCHC: 33.2 g/dL (ref 32.0–36.0)
MCV: 91.1 fL (ref 80.0–100.0)
MPV: 10 fL (ref 7.5–12.5)
Monocytes Relative: 11.4 %
NEUTROS ABS: 1252 {cells}/uL — AB (ref 1500–7800)
Neutrophils Relative %: 29.8 %
PLATELETS: 257 10*3/uL (ref 140–400)
RBC: 4.73 10*6/uL (ref 4.20–5.80)
RDW: 12.9 % (ref 11.0–15.0)
TOTAL LYMPHOCYTE: 49.5 %
WBC: 4.2 10*3/uL (ref 3.8–10.8)
WBCMIX: 479 {cells}/uL (ref 200–950)

## 2018-01-28 LAB — URIC ACID: Uric Acid, Serum: 5.4 mg/dL (ref 4.0–8.0)

## 2018-01-28 LAB — MICROALBUMIN / CREATININE URINE RATIO
CREATININE, URINE: 76 mg/dL (ref 20–320)
MICROALB UR: 0.5 mg/dL
Microalb Creat Ratio: 7 mcg/mg creat (ref <30)

## 2018-01-28 LAB — HEMOGLOBIN A1C
EAG (MMOL/L): 7 (calc)
Hgb A1c MFr Bld: 6 % of total Hgb — ABNORMAL HIGH (ref <5.7)
Mean Plasma Glucose: 126 (calc)

## 2018-01-28 LAB — MAGNESIUM: Magnesium: 1.9 mg/dL (ref 1.5–2.5)

## 2018-01-28 LAB — PSA: PSA: 2.2 ng/mL (ref <=4.0)

## 2018-01-28 LAB — VITAMIN D 25 HYDROXY (VIT D DEFICIENCY, FRACTURES): VIT D 25 HYDROXY: 70 ng/mL (ref 30–100)

## 2018-01-28 LAB — INSULIN, RANDOM: Insulin: 7.8 u[IU]/mL (ref 2.0–19.6)

## 2018-01-28 LAB — TSH: TSH: 1.65 mIU/L (ref 0.40–4.50)

## 2018-01-29 ENCOUNTER — Encounter: Payer: Self-pay | Admitting: Internal Medicine

## 2018-01-31 ENCOUNTER — Other Ambulatory Visit: Payer: Self-pay | Admitting: Internal Medicine

## 2018-02-07 ENCOUNTER — Ambulatory Visit (HOSPITAL_COMMUNITY)
Admission: RE | Admit: 2018-02-07 | Discharge: 2018-02-07 | Disposition: A | Discharge status: Home / Self Care | Payer: Medicare Other | Source: Ambulatory Visit | Attending: Cardiology | Admitting: Cardiology

## 2018-02-07 DIAGNOSIS — I739 Peripheral vascular disease, unspecified: Secondary | ICD-10-CM

## 2018-02-25 ENCOUNTER — Other Ambulatory Visit: Payer: Self-pay | Admitting: Internal Medicine

## 2018-02-25 DIAGNOSIS — G4700 Insomnia, unspecified: Secondary | ICD-10-CM

## 2018-02-26 DIAGNOSIS — G4733 Obstructive sleep apnea (adult) (pediatric): Secondary | ICD-10-CM | POA: Diagnosis not present

## 2018-03-31 ENCOUNTER — Other Ambulatory Visit: Payer: Self-pay | Admitting: Internal Medicine

## 2018-04-27 NOTE — Progress Notes (Signed)
FOLLOW UP  Assessment and Plan:   Hypertension Well controlled with current medications  Monitor blood pressure at home; patient to call if consistently greater than 130/80 Continue DASH diet.   Reminder to go to the ER if any CP, SOB, nausea, dizziness, severe HA, changes vision/speech, left arm numbness and tingling and jaw pain.  Cholesterol Currently above goal; statin intolerant in past; imrproving with lifestyle and gemfibrozil In light of age and no CVD or DM, LDL goal <130 Continue low cholesterol diet and exercise.  Check lipid panel.   Prediabetes Recent A1C tending down Continue diet and exercise.  Perform daily foot/skin check, notify office of any concerning changes.  Check A1C  Vitamin D Def At goal at last visit; continue supplementation to maintain goal of 70-100 Defer Vit D level  GERD with esophagitis Symptoms well managed without breakthrough; continue ranitidine 150 mg BID Discussed diet, avoiding triggers and other lifestyle changes  Gout Continue allopurinol Diet discussed Check uric acid as needed  Insomnia Reviewed gabapentin; patient will try this medication Suggested benadryl 25-50 mg alternatively as he has not tried this good sleep hygiene discussed, increase day time activity  Continue diet and meds as discussed. Further disposition pending results of labs. Discussed med's effects and SE's.   Over 30 minutes of exam, counseling, chart review, and critical decision making was performed.   Future Appointments  Date Time Provider Department Center  07/28/2018  3:30 PM [name], MD [facility] None  11/08/2018  2:00 PM Quentin Mulling, PA-C [facility] None    ----------------------------------------------------------------------------------------------------------------------  HPI 84 y.o. male  presents for 3 month follow up on hypertension, cholesterol, prediabetes, GERD, gout, weight and vitamin D deficiency.   He is currently  prescribed klonopin 1-2 mg for insomnia; he reports he takes 1 mg approximately once a week. He was prescribed gabapentin 300 mg to try for sleep but never took due to SE profile. He reports difficulty staying asleep. He has not tried benadryl or trazodone.   BMI is Body mass index is 25.8 kg/m., he has been working on diet and exercise, thought he states he has only done very mild changes, needs to do more.  Wt Readings from Last 3 Encounters:  04/28/18 185 lb (83.9 kg)  01/26/18 181 lb 12.8 oz (82.5 kg)  12/13/17 178 lb (80.7 kg)   he has a diagnosis of GERD with esophagitis which is currently managed by ranitidine 150 mg BID daily. Off of prilosec and doing well.  he reports symptoms is currently well controlled, and denies breakthrough reflux, burning in chest, hoarseness or cough.    His blood pressure has been controlled at home, today their BP is BP: 110/64  He does workout. He denies chest pain, shortness of breath, dizziness.   He is on cholesterol medication (gemfibrozil, hx of atorvastatin intolerance) and denies myalgias. His cholesterol is not at goal. The cholesterol last visit was:   Lab Results  Component Value Date   CHOL 234 (H) 01/26/2018   HDL 79 01/26/2018   LDLCALC 138 (H) 01/26/2018   TRIG 71 01/26/2018   CHOLHDL 3.0 01/26/2018    He has been working on diet and exercise for prediabetes, and denies increased appetite, nausea, paresthesia of the feet, polydipsia, polyuria, visual disturbances and vomiting. Last A1C in the office was:  Lab Results  Component Value Date   HGBA1C 6.0 (H) 01/26/2018   Patient is on Vitamin D supplement and at goal at the last visit:    Lab  Results  Component Value Date   VD25OH 72 01/26/2018     Patient is on allopurinol for gout and does not report a recent flare.  Lab Results  Component Value Date   LABURIC 5.4 01/26/2018     Current Medications:  Current Outpatient Medications on File Prior to Visit  Medication Sig  .  allopurinol (ZYLOPRIM) 300 MG tablet TAKE 1 TABLET BY MOUTH  EVERY DAY TO PREVENT GOUT (Patient taking differently: 150 mg. TAKE 1 TABLET BY MOUTH EVERY DAY TO PREVENT GOUT)  . atenolol (TENORMIN) 100 MG tablet Take 1 tablet (100 mg total) by mouth daily.  . Cholecalciferol (VITAMIN D PO) Take 2,000 Int'l Units by mouth 2 (two) times daily.   . clonazePAM (KLONOPIN) 2 MG tablet Take 1/2-1 tablet at bedtime ONLY if needed for Sleep &  limit to 5 days /week to avoid addiction  . gemfibrozil (LOPID) 600 MG tablet TAKE 1 TABLET BY MOUTH TWO  TIMES DAILY  . hydrochlorothiazide (HYDRODIURIL) 25 MG tablet TAKE 1 TABLET BY MOUTH  DAILY  . omeprazole (PRILOSEC) 40 MG capsule TAKE 1 CAPSULE BY MOUTH  DAILY FOR ACID INDIGESTION  AND ACID REFLUX  . ranitidine (ZANTAC) 150 MG tablet TAKE 1 TABLET BY MOUTH TWO  TIMES DAILY  . sildenafil (REVATIO) 20 MG tablet Take 2 to 5 tablets as needed once daily prior to erection   No current facility-administered medications on file prior to visit.      Allergies:  Allergies  Allergen Reactions  . Ace Inhibitors Cough  . Lipitor [Atorvastatin]     Elevates CPK and Aldolase     Medical History:  Past Medical History:  Diagnosis Date  . Gout   . Hyperlipidemia   . Hypertension   . Prediabetes    Family history- Reviewed and unchanged Social history- Reviewed and unchanged   Review of Systems:  Review of Systems  Constitutional: Negative for malaise/fatigue and weight loss.  HENT: Negative for hearing loss and tinnitus.   Eyes: Negative for blurred vision and double vision.  Respiratory: Negative for cough, shortness of breath and wheezing.   Cardiovascular: Negative for chest pain, palpitations, orthopnea, claudication and leg swelling.  Gastrointestinal: Negative for abdominal pain, blood in stool, constipation, diarrhea, heartburn, melena, nausea and vomiting.  Genitourinary: Negative.   Musculoskeletal: Negative for joint pain and myalgias.  Skin:  Negative for rash.  Neurological: Negative for dizziness, tingling, sensory change, weakness and headaches.  Endo/Heme/Allergies: Negative for polydipsia.  Psychiatric/Behavioral: Negative for depression, memory loss and substance abuse. The patient has insomnia. The patient is not nervous/anxious.   All other systems reviewed and are negative.   Physical Exam: BP 110/64   Pulse 62   Temp (!) 97.3 F (36.3 C)   Ht 5\' 11"  (1.803 m)   Wt 185 lb (83.9 kg)   SpO2 96%   BMI 25.80 kg/m  Wt Readings from Last 3 Encounters:  04/28/18 185 lb (83.9 kg)  01/26/18 181 lb 12.8 oz (82.5 kg)  12/13/17 178 lb (80.7 kg)   General Appearance: Well nourished, in no apparent distress. Eyes: PERRLA, EOMs, conjunctiva no swelling or erythema Sinuses: No Frontal/maxillary tenderness ENT/Mouth: Ext aud canals clear, TMs without erythema, bulging. No erythema, swelling, or exudate on post pharynx.  Tonsils not swollen or erythematous. Hearing normal.  Neck: Supple, thyroid normal.  Respiratory: Respiratory effort normal, BS equal bilaterally without rales, rhonchi, wheezing or stridor.  Cardio: RRR with no MRGs. Brisk peripheral pulses without edema.  Abdomen:  Soft, + BS.  Non tender, no guarding, rebound, hernias, masses. Lymphatics: Non tender without lymphadenopathy.  Musculoskeletal: Full ROM, 5/5 strength, Normal gait Skin: Warm, dry without rashes, lesions, ecchymosis.  Neuro: Cranial nerves intact. No cerebellar symptoms.  Psych: Awake and oriented X 3, normal affect, Insight and Judgment appropriate.    Dan Maker, NP 3:40 PM [facility]

## 2018-04-28 ENCOUNTER — Ambulatory Visit (INDEPENDENT_AMBULATORY_CARE_PROVIDER_SITE_OTHER): Payer: Medicare Other | Admitting: Adult Health

## 2018-04-28 ENCOUNTER — Other Ambulatory Visit: Payer: Self-pay | Admitting: Adult Health

## 2018-04-28 ENCOUNTER — Encounter: Payer: Self-pay | Admitting: Adult Health

## 2018-04-28 VITALS — BP 110/64 | HR 62 | Temp 97.3°F | Ht 71.0 in | Wt 185.0 lb

## 2018-04-28 DIAGNOSIS — E782 Mixed hyperlipidemia: Secondary | ICD-10-CM | POA: Diagnosis not present

## 2018-04-28 DIAGNOSIS — K2100 Gastro-esophageal reflux disease with esophagitis, without bleeding: Secondary | ICD-10-CM

## 2018-04-28 DIAGNOSIS — Z79899 Other long term (current) drug therapy: Secondary | ICD-10-CM | POA: Diagnosis not present

## 2018-04-28 DIAGNOSIS — I10 Essential (primary) hypertension: Secondary | ICD-10-CM

## 2018-04-28 DIAGNOSIS — E559 Vitamin D deficiency, unspecified: Secondary | ICD-10-CM | POA: Diagnosis not present

## 2018-04-28 DIAGNOSIS — R7309 Other abnormal glucose: Secondary | ICD-10-CM | POA: Diagnosis not present

## 2018-04-28 DIAGNOSIS — R7303 Prediabetes: Secondary | ICD-10-CM | POA: Diagnosis not present

## 2018-04-28 DIAGNOSIS — Z6825 Body mass index (BMI) 25.0-25.9, adult: Secondary | ICD-10-CM

## 2018-04-28 DIAGNOSIS — M1000 Idiopathic gout, unspecified site: Secondary | ICD-10-CM

## 2018-04-28 NOTE — Patient Instructions (Addendum)
Goals    . Exercise 150 min/wk Moderate Activity     Aim to walk 30 min, 5+ days     . HEMOGLOBIN A1C < 5.7    . LDL CALC < 130         Try gabapentin 300 mg once at night  - most people tolerate this without problems (side effects are rare at low doses taken at night for sleep)  Try benadryl (dephenhydramine) 25-50 mg 1 hour prior to sleep -   If not helping call back and we will try trazodone      Insomnia Insomnia is a sleep disorder that makes it difficult to fall asleep or stay asleep. Insomnia can cause fatigue, low energy, difficulty concentrating, mood swings, and poor performance at work or school. There are three different ways to classify insomnia:  Difficulty falling asleep.  Difficulty staying asleep.  Waking up too early in the morning. Any type of insomnia can be long-term (chronic) or short-term (acute). Both are common. Short-term insomnia usually lasts for three months or less. Chronic insomnia occurs at least three times a week for longer than three months. What are the causes? Insomnia may be caused by another condition, situation, or substance, such as:  Anxiety.  Certain medicines.  Gastroesophageal reflux disease (GERD) or other gastrointestinal conditions.  Asthma or other breathing conditions.  Restless legs syndrome, sleep apnea, or other sleep disorders.  Chronic pain.  Menopause.  Stroke.  Abuse of alcohol, tobacco, or illegal drugs.  Mental health conditions, such as depression.  Caffeine.  Neurological disorders, such as Alzheimer's disease.  An overactive thyroid (hyperthyroidism). Sometimes, the cause of insomnia may not be known. What increases the risk? Risk factors for insomnia include:  Gender. Women are affected more often than men.  Age. Insomnia is more common as you get older.  Stress.  Lack of exercise.  Irregular work schedule or working night shifts.  Traveling between different time zones.  Certain  medical and mental health conditions. What are the signs or symptoms? If you have insomnia, the main symptom is having trouble falling asleep or having trouble staying asleep. This may lead to other symptoms, such as:  Feeling fatigued or having low energy.  Feeling nervous about going to sleep.  Not feeling rested in the morning.  Having trouble concentrating.  Feeling irritable, anxious, or depressed. How is this diagnosed? This condition may be diagnosed based on:  Your symptoms and medical history. Your health care provider may ask about: ? Your sleep habits. ? Any medical conditions you have. ? Your mental health.  A physical exam. How is this treated? Treatment for insomnia depends on the cause. Treatment may focus on treating an underlying condition that is causing insomnia. Treatment may also include:  Medicines to help you sleep.  Counseling or therapy.  Lifestyle adjustments to help you sleep better. Follow these instructions at home: Eating and drinking   Limit or avoid alcohol, caffeinated beverages, and cigarettes, especially close to bedtime. These can disrupt your sleep.  Do not eat a large meal or eat spicy foods right before bedtime. This can lead to digestive discomfort that can make it hard for you to sleep. Sleep habits   Keep a sleep diary to help you and your health care provider figure out what could be causing your insomnia. Write down: ? When you sleep. ? When you wake up during the night. ? How well you sleep. ? How rested you feel the next day. ?  Any side effects of medicines you are taking. ? What you eat and drink.  Make your bedroom a dark, comfortable place where it is easy to fall asleep. ? Put up shades or blackout curtains to block light from outside. ? Use a white noise machine to block noise. ? Keep the temperature cool.  Limit screen use before bedtime. This includes: ? Watching TV. ? Using your smartphone, tablet, or  computer.  Stick to a routine that includes going to bed and waking up at the same times every day and night. This can help you fall asleep faster. Consider making a quiet activity, such as reading, part of your nighttime routine.  Try to avoid taking naps during the day so that you sleep better at night.  Get out of bed if you are still awake after 15 minutes of trying to sleep. Keep the lights down, but try reading or doing a quiet activity. When you feel sleepy, go back to bed. General instructions  Take over-the-counter and prescription medicines only as told by your health care provider.  Exercise regularly, as told by your health care provider. Avoid exercise starting several hours before bedtime.  Use relaxation techniques to manage stress. Ask your health care provider to suggest some techniques that may work well for you. These may include: ? Breathing exercises. ? Routines to release muscle tension. ? Visualizing peaceful scenes.  Make sure that you drive carefully. Avoid driving if you feel very sleepy.  Keep all follow-up visits as told by your health care provider. This is important. Contact a health care provider if:  You are tired throughout the day.  You have trouble in your daily routine due to sleepiness.  You continue to have sleep problems, or your sleep problems get worse. Get help right away if:  You have serious thoughts about hurting yourself or someone else. If you ever feel like you may hurt yourself or others, or have thoughts about taking your own life, get help right away. You can go to your nearest emergency department or call:  Your local emergency services (911 in the [place]  A suicide crisis helpline, such as the National Suicide Prevention Lifeline at 337 847 0927. This is open 24 hours a day. Summary  Insomnia is a sleep disorder that makes it difficult to fall asleep or stay asleep.  Insomnia can be long-term (chronic) or short-term  (acute).  Treatment for insomnia depends on the cause. Treatment may focus on treating an underlying condition that is causing insomnia.  Keep a sleep diary to help you and your health care provider figure out what could be causing your insomnia. This information is not intended to replace advice given to you by your health care provider. Make sure you discuss any questions you have with your health care provider. Document Released: 02/14/2000 Document Revised: 11/26/2016 Document Reviewed: 11/26/2016 Elsevier Interactive Patient Education  2019 [employer]vier [employer]    Trazodone tablets What is this medicine? TRAZODONE (TRAZ oh done) is used to treat depression. This medicine may be used for other purposes; ask your health care provider or pharmacist if you have questions. COMMON BRAND NAME(S): Desyrel What should I tell my health care provider before I take this medicine? They need to know if you have any of these conditions: -attempted suicide or thinking about it -bipolar disorder -bleeding problems -glaucoma -heart disease, or previous heart attack -irregular heart beat -kidney or liver disease -low levels of sodium in the blood -an unusual or allergic reaction to  trazodone, other medicines, foods, dyes or preservatives -pregnant or trying to get pregnant -breast-feeding How should I use this medicine? Take this medicine by mouth with a glass of water. Follow the directions on the prescription label. Take this medicine shortly after a meal or a light snack. Take your medicine at regular intervals. Do not take your medicine more often than directed. Do not stop taking this medicine suddenly except upon the advice of your doctor. Stopping this medicine too quickly may cause serious side effects or your condition may worsen. A special MedGuide will be given to you by the pharmacist with each prescription and refill. Be sure to read this information carefully each time. Talk to your  pediatrician regarding the use of this medicine in children. Special care may be needed. Overdosage: If you think you have taken too much of this medicine contact a poison control center or emergency room at once. NOTE: This medicine is only for you. Do not share this medicine with others. What if I miss a dose? If you miss a dose, take it as soon as you can. If it is almost time for your next dose, take only that dose. Do not take double or extra doses. What may interact with this medicine? Do not take this medicine with any of the following medications: -certain medicines for fungal infections like fluconazole, itraconazole, ketoconazole, posaconazole, voriconazole -cisapride -dofetilide -dronedarone -linezolid -MAOIs like Carbex, Eldepryl, Marplan, Nardil, and Parnate -mesoridazine -methylene blue (injected into a vein) -pimozide -saquinavir -thioridazine This medicine may also interact with the following medications: -alcohol -antiviral medicines for HIV or AIDS -aspirin and aspirin-like medicines -barbiturates like phenobarbital -certain medicines for blood pressure, heart disease, irregular heart beat -certain medicines for depression, anxiety, or psychotic disturbances -certain medicines for migraine headache like almotriptan, eletriptan, frovatriptan, naratriptan, rizatriptan, sumatriptan, zolmitriptan -certain medicines for seizures like carbamazepine and phenytoin -certain medicines for sleep -certain medicines that treat or prevent blood clots like dalteparin, enoxaparin, warfarin -digoxin -fentanyl -lithium -NSAIDS, medicines for pain and inflammation, like ibuprofen or naproxen -other medicines that prolong the QT interval (cause an abnormal heart rhythm) -rasagiline -supplements like St. John's wort, kava kava, valerian -tramadol -tryptophan This list may not describe all possible interactions. Give your health care provider a list of all the medicines, herbs,  non-prescription drugs, or dietary supplements you use. Also tell them if you smoke, drink alcohol, or use illegal drugs. Some items may interact with your medicine. What should I watch for while using this medicine? Tell your doctor if your symptoms do not get better or if they get worse. Visit your doctor or health care professional for regular checks on your progress. Because it may take several weeks to see the full effects of this medicine, it is important to continue your treatment as prescribed by your doctor. Patients and their families should watch out for new or worsening thoughts of suicide or depression. Also watch out for sudden changes in feelings such as feeling anxious, agitated, panicky, irritable, hostile, aggressive, impulsive, severely restless, overly excited and hyperactive, or not being able to sleep. If this happens, especially at the beginning of treatment or after a change in dose, call your health care professional. Bonita Quin may get drowsy or dizzy. Do not drive, use machinery, or do anything that needs mental alertness until you know how this medicine affects you. Do not stand or sit up quickly, especially if you are an older patient. This reduces the risk of dizzy or fainting spells. Alcohol may  interfere with the effect of this medicine. Avoid alcoholic drinks. This medicine may cause dry eyes and blurred vision. If you wear contact lenses you may feel some discomfort. Lubricating drops may help. See your eye doctor if the problem does not go away or is severe. Your mouth may get dry. Chewing sugarless gum, sucking hard candy and drinking plenty of water may help. Contact your doctor if the problem does not go away or is severe. What side effects may I notice from receiving this medicine? Side effects that you should report to your doctor or health care professional as soon as possible: -allergic reactions like skin rash, itching or hives, swelling of the face, lips, or  tongue -elevated mood, decreased need for sleep, racing thoughts, impulsive behavior -confusion -fast, irregular heartbeat -feeling faint or lightheaded, falls -feeling agitated, angry, or irritable -loss of balance or coordination -painful or prolonged erections -restlessness, pacing, inability to keep still -suicidal thoughts or other mood changes -tremors -trouble sleeping -seizures -unusual bleeding or bruising Side effects that usually do not require medical attention (report to your doctor or health care professional if they continue or are bothersome): -change in sex drive or performance -change in appetite or weight -constipation -headache -muscle aches or pains -nausea This list may not describe all possible side effects. Call your doctor for medical advice about side effects. You may report side effects to FDA at 1-800-FDA-1088. Where should I keep my medicine? Keep out of the reach of children. Store at room temperature between 15 and 30 degrees C (59 to 86 degrees F). Protect from light. Keep container tightly closed. Throw away any unused medicine after the expiration date. NOTE: This sheet is a summary. It may not cover all possible information. If you have questions about this medicine, talk to your doctor, pharmacist, or health care provider.  2019 Elsevier/Gold Standard (2017-04-27 17:51:24)

## 2018-04-29 LAB — COMPLETE METABOLIC PANEL WITH GFR
AG Ratio: 1.2 (calc) (ref 1.0–2.5)
ALKALINE PHOSPHATASE (APISO): 70 U/L (ref 35–144)
ALT: 10 U/L (ref 9–46)
AST: 23 U/L (ref 10–35)
Albumin: 4.2 g/dL (ref 3.6–5.1)
BILIRUBIN TOTAL: 0.3 mg/dL (ref 0.2–1.2)
BUN: 19 mg/dL (ref 7–25)
CHLORIDE: 98 mmol/L (ref 98–110)
CO2: 27 mmol/L (ref 20–32)
Calcium: 9.4 mg/dL (ref 8.6–10.3)
Creat: 0.95 mg/dL (ref 0.70–1.11)
GFR, Est African American: 86 mL/min/{1.73_m2} (ref >=60)
GFR, Est Non African American: 74 mL/min/{1.73_m2} (ref >=60)
GLUCOSE: 78 mg/dL (ref 65–99)
Globulin: 3.4 g/dL (calc) (ref 1.9–3.7)
Potassium: 3.9 mmol/L (ref 3.5–5.3)
Sodium: 135 mmol/L (ref 135–146)
Total Protein: 7.6 g/dL (ref 6.1–8.1)

## 2018-04-29 LAB — LIPID PANEL
Cholesterol: 226 mg/dL — ABNORMAL HIGH (ref <200)
HDL: 66 mg/dL (ref >=40)
LDL Cholesterol (Calc): 137 mg/dL (calc) — ABNORMAL HIGH
Non-HDL Cholesterol (Calc): 160 mg/dL (calc) — ABNORMAL HIGH (ref <130)
Total CHOL/HDL Ratio: 3.4 (calc) (ref <5.0)
Triglycerides: 109 mg/dL (ref <150)

## 2018-04-29 LAB — TSH: TSH: 2.33 mIU/L (ref 0.40–4.50)

## 2018-04-29 LAB — HEMOGLOBIN A1C
Hgb A1c MFr Bld: 6 % of total Hgb — ABNORMAL HIGH (ref <5.7)
Mean Plasma Glucose: 126 (calc)
eAG (mmol/L): 7 (calc)

## 2018-04-29 LAB — CBC WITH DIFFERENTIAL/PLATELET
Absolute Monocytes: 535 cells/uL (ref 200–950)
BASOS ABS: 50 {cells}/uL (ref 0–200)
Basophils Relative: 1 %
EOS PCT: 9.9 %
Eosinophils Absolute: 495 cells/uL (ref 15–500)
HCT: 40.9 % (ref 38.5–50.0)
Hemoglobin: 13.9 g/dL (ref 13.2–17.1)
Lymphs Abs: 2515 cells/uL (ref 850–3900)
MCH: 30.8 pg (ref 27.0–33.0)
MCHC: 34 g/dL (ref 32.0–36.0)
MCV: 90.5 fL (ref 80.0–100.0)
MONOS PCT: 10.7 %
MPV: 10.2 fL (ref 7.5–12.5)
NEUTROS PCT: 28.1 %
Neutro Abs: 1405 cells/uL — ABNORMAL LOW (ref 1500–7800)
Platelets: 245 10*3/uL (ref 140–400)
RBC: 4.52 10*6/uL (ref 4.20–5.80)
RDW: 12.8 % (ref 11.0–15.0)
Total Lymphocyte: 50.3 %
WBC: 5 10*3/uL (ref 3.8–10.8)

## 2018-04-29 LAB — MAGNESIUM: Magnesium: 2.1 mg/dL (ref 1.5–2.5)

## 2018-06-14 ENCOUNTER — Telehealth: Payer: Self-pay

## 2018-06-15 NOTE — Telephone Encounter (Signed)
Error

## 2018-06-29 DIAGNOSIS — G4733 Obstructive sleep apnea (adult) (pediatric): Secondary | ICD-10-CM | POA: Diagnosis not present

## 2018-07-07 NOTE — Progress Notes (Signed)
Virtual Visit via Telephone Note  I connected with Jeremy Boone on 07/08/18 at 11:30 AM EDT by telephone and verified that I am speaking with the correct person using two identifiers.  Location: Patient: home Provider: [facility] office    I discussed the limitations, risks, security and privacy concerns of performing an evaluation and management service by telephone and the availability of in person appointments. I also discussed with the patient that there may be a patient responsible charge related to this service. The patient expressed understanding and agreed to proceed.  I discussed the assessment and treatment plan with the patient. The patient was provided an opportunity to ask questions and all were answered. The patient agreed with the plan and demonstrated an understanding of the instructions.   The patient was advised to call back or seek an in-person evaluation if the symptoms worsen or if the condition fails to improve as anticipated.  I provided 15 minutes of non-face-to-face time during this encounter.   Dan Maker, NP   Assessment and Plan:  Diagnoses and all orders for this visit:  Insomnia, unspecified type Has failed gabapentin, benadryl, klonopin good sleep hygiene discussed, increase day time activity Follow up as scheduled in 3 weeks Call with any questions/concerns or SE -     traZODone (DESYREL) 150 MG tablet; Take 1/3-1 tab daily at bedtime for sleep.  Further disposition pending results of labs. Discussed med's effects and SE's.   Over 15 minutes of exam, counseling, chart review, and critical decision making was performed.   Future Appointments  Date Time Provider Department Center  07/28/2018  3:30 PM [name], MD [facility] None  11/08/2018  2:00 PM Quentin Mulling, PA-C [facility] None    ------------------------------------------------------------------------------------------------------------------   HPI BP 115/72  84 y.o.male  presents for discussion of persistent sleep problems; he reports having some difficulty falling asleep, but also frequently will have problems falling back asleep after getting up to urinate in the early AM (not getting up more than 1-2 times).   He has tried gabapentin 300-600 mg, benadryl 25-50 mg, klonopin 2 mg so far without satisfactory results. We have discussed trazodone in the past and he is interested in trying this today.   Past Medical History:  Diagnosis Date  . Gout   . Hyperlipidemia   . Hypertension   . Prediabetes      Allergies  Allergen Reactions  . Ace Inhibitors Cough  . Lipitor [Atorvastatin]     Elevates CPK and Aldolase    Current Outpatient Medications on File Prior to Visit  Medication Sig  . allopurinol (ZYLOPRIM) 300 MG tablet TAKE 1 TABLET BY MOUTH  EVERY DAY TO PREVENT GOUT (Patient taking differently: 150 mg. TAKE 1 TABLET BY MOUTH EVERY DAY TO PREVENT GOUT)  . atenolol (TENORMIN) 100 MG tablet Take 1 tablet (100 mg total) by mouth daily.  . Cholecalciferol (VITAMIN D PO) Take 2,000 Int'l Units by mouth 2 (two) times daily.   . clonazePAM (KLONOPIN) 2 MG tablet Take 1/2-1 tablet at bedtime ONLY if needed for Sleep &  limit to 5 days /week to avoid addiction  . gemfibrozil (LOPID) 600 MG tablet TAKE 1 TABLET BY MOUTH TWO  TIMES DAILY  . hydrochlorothiazide (HYDRODIURIL) 25 MG tablet TAKE 1 TABLET BY MOUTH  DAILY  . meloxicam (MOBIC) 15 MG tablet TAKE 1 TABLET BY MOUTH  DAILY WITH FOOD AS NEEDED  FOR PAIN. CAN TAKE WITH  TYLENOL, NOT WITH ALEVE AND IBURPOFEN. (Patient not taking: Reported  on 04/28/2018)  . ranitidine (ZANTAC) 150 MG tablet TAKE 1 TABLET BY MOUTH TWO  TIMES DAILY  . sildenafil (REVATIO) 20 MG tablet Take 2 to 5 tablets as needed once daily prior to erection   No current facility-administered medications on file prior to visit.     ROS: all negative except above.   Physical Exam:  BP 115/72   General : Well sounding patient in no  apparent distress HEENT: no hoarseness, no cough for duration of visit Lungs: speaks in complete sentences, no audible wheezing, no apparent distress Neurological: alert, oriented x 3 Psychiatric: pleasant, judgement appropriate    Dan Maker, NP 11:47 AM [facility]

## 2018-07-08 ENCOUNTER — Other Ambulatory Visit: Payer: Self-pay

## 2018-07-08 ENCOUNTER — Ambulatory Visit: Payer: Medicare Other | Admitting: Adult Health

## 2018-07-08 ENCOUNTER — Encounter: Payer: Self-pay | Admitting: Adult Health

## 2018-07-08 VITALS — BP 115/72

## 2018-07-08 DIAGNOSIS — G4700 Insomnia, unspecified: Secondary | ICD-10-CM | POA: Diagnosis not present

## 2018-07-08 MED ORDER — TRAZODONE HCL 150 MG PO TABS: ORAL_TABLET | ORAL | 2 refills | Status: DC

## 2018-07-08 NOTE — Patient Instructions (Signed)
Try trazodone 50-150 mg for sleep; start with lowest dose and increase as needed  Can still try klonopin if awake for bathroom and can't fall back asleep   Insomnia Insomnia is a sleep disorder that makes it difficult to fall asleep or stay asleep. Insomnia can cause fatigue, low energy, difficulty concentrating, mood swings, and poor performance at work or school. There are three different ways to classify insomnia:  Difficulty falling asleep.  Difficulty staying asleep.  Waking up too early in the morning. Any type of insomnia can be long-term (chronic) or short-term (acute). Both are common. Short-term insomnia usually lasts for three months or less. Chronic insomnia occurs at least three times a week for longer than three months. What are the causes? Insomnia may be caused by another condition, situation, or substance, such as:  Anxiety.  Certain medicines.  Gastroesophageal reflux disease (GERD) or other gastrointestinal conditions.  Asthma or other breathing conditions.  Restless legs syndrome, sleep apnea, or other sleep disorders.  Chronic pain.  Menopause.  Stroke.  Abuse of alcohol, tobacco, or illegal drugs.  Mental health conditions, such as depression.  Caffeine.  Neurological disorders, such as Alzheimer's disease.  An overactive thyroid (hyperthyroidism). Sometimes, the cause of insomnia may not be known. What increases the risk? Risk factors for insomnia include:  Gender. Women are affected more often than men.  Age. Insomnia is more common as you get older.  Stress.  Lack of exercise.  Irregular work schedule or working night shifts.  Traveling between different time zones.  Certain medical and mental health conditions. What are the signs or symptoms? If you have insomnia, the main symptom is having trouble falling asleep or having trouble staying asleep. This may lead to other symptoms, such as:  Feeling fatigued or having low energy.   Feeling nervous about going to sleep.  Not feeling rested in the morning.  Having trouble concentrating.  Feeling irritable, anxious, or depressed. How is this diagnosed? This condition may be diagnosed based on:  Your symptoms and medical history. Your health care provider may ask about: ? Your sleep habits. ? Any medical conditions you have. ? Your mental health.  A physical exam. How is this treated? Treatment for insomnia depends on the cause. Treatment may focus on treating an underlying condition that is causing insomnia. Treatment may also include:  Medicines to help you sleep.  Counseling or therapy.  Lifestyle adjustments to help you sleep better. Follow these instructions at home: Eating and drinking   Limit or avoid alcohol, caffeinated beverages, and cigarettes, especially close to bedtime. These can disrupt your sleep.  Do not eat a large meal or eat spicy foods right before bedtime. This can lead to digestive discomfort that can make it hard for you to sleep. Sleep habits   Keep a sleep diary to help you and your health care provider figure out what could be causing your insomnia. Write down: ? When you sleep. ? When you wake up during the night. ? How well you sleep. ? How rested you feel the next day. ? Any side effects of medicines you are taking. ? What you eat and drink.  Make your bedroom a dark, comfortable place where it is easy to fall asleep. ? Put up shades or blackout curtains to block light from outside. ? Use a white noise machine to block noise. ? Keep the temperature cool.  Limit screen use before bedtime. This includes: ? Watching TV. ? Using your smartphone, tablet, or  computer.  Stick to a routine that includes going to bed and waking up at the same times every day and night. This can help you fall asleep faster. Consider making a quiet activity, such as reading, part of your nighttime routine.  Try to avoid taking naps during  the day so that you sleep better at night.  Get out of bed if you are still awake after 15 minutes of trying to sleep. Keep the lights down, but try reading or doing a quiet activity. When you feel sleepy, go back to bed. General instructions  Take over-the-counter and prescription medicines only as told by your health care provider.  Exercise regularly, as told by your health care provider. Avoid exercise starting several hours before bedtime.  Use relaxation techniques to manage stress. Ask your health care provider to suggest some techniques that may work well for you. These may include: ? Breathing exercises. ? Routines to release muscle tension. ? Visualizing peaceful scenes.  Make sure that you drive carefully. Avoid driving if you feel very sleepy.  Keep all follow-up visits as told by your health care provider. This is important. Contact a health care provider if:  You are tired throughout the day.  You have trouble in your daily routine due to sleepiness.  You continue to have sleep problems, or your sleep problems get worse. Get help right away if:  You have serious thoughts about hurting yourself or someone else. If you ever feel like you may hurt yourself or others, or have thoughts about taking your own life, get help right away. You can go to your nearest emergency department or call:  Your local emergency services (911 in the [place]  A suicide crisis helpline, such as the National Suicide Prevention Lifeline at (808)251-0849. This is open 24 hours a day. Summary  Insomnia is a sleep disorder that makes it difficult to fall asleep or stay asleep.  Insomnia can be long-term (chronic) or short-term (acute).  Treatment for insomnia depends on the cause. Treatment may focus on treating an underlying condition that is causing insomnia.  Keep a sleep diary to help you and your health care provider figure out what could be causing your insomnia. This information is  not intended to replace advice given to you by your health care provider. Make sure you discuss any questions you have with your health care provider. Document Released: 02/14/2000 Document Revised: 11/26/2016 Document Reviewed: 11/26/2016 Elsevier Interactive Patient Education  2019 Elsevier Inc.    Trazodone tablets What is this medicine? TRAZODONE (TRAZ oh done) is used to treat depression or insomnia This medicine may be used for other purposes; ask your health care provider or pharmacist if you have questions. COMMON BRAND NAME(S): Desyrel What should I tell my health care provider before I take this medicine? They need to know if you have any of these conditions: -attempted suicide or thinking about it -bipolar disorder -bleeding problems -glaucoma -heart disease, or previous heart attack -irregular heart beat -kidney or liver disease -low levels of sodium in the blood -an unusual or allergic reaction to trazodone, other medicines, foods, dyes or preservatives -pregnant or trying to get pregnant -breast-feeding How should I use this medicine? Take this medicine by mouth with a glass of water. Follow the directions on the prescription label. Take this medicine shortly after a meal or a light snack. Take your medicine at regular intervals. Do not take your medicine more often than directed. Do not stop taking this medicine suddenly  except upon the advice of your doctor. Stopping this medicine too quickly may cause serious side effects or your condition may worsen. A special MedGuide will be given to you by the pharmacist with each prescription and refill. Be sure to read this information carefully each time. Talk to your pediatrician regarding the use of this medicine in children. Special care may be needed. Overdosage: If you think you have taken too much of this medicine contact a poison control center or emergency room at once. NOTE: This medicine is only for you. Do not share  this medicine with others. What if I miss a dose? If you miss a dose, take it as soon as you can. If it is almost time for your next dose, take only that dose. Do not take double or extra doses. What may interact with this medicine? Do not take this medicine with any of the following medications: -certain medicines for fungal infections like fluconazole, itraconazole, ketoconazole, posaconazole, voriconazole -cisapride -dofetilide -dronedarone -linezolid -MAOIs like Carbex, Eldepryl, Marplan, Nardil, and Parnate -mesoridazine -methylene blue (injected into a vein) -pimozide -saquinavir -thioridazine This medicine may also interact with the following medications: -alcohol -antiviral medicines for HIV or AIDS -aspirin and aspirin-like medicines -barbiturates like phenobarbital -certain medicines for blood pressure, heart disease, irregular heart beat -certain medicines for depression, anxiety, or psychotic disturbances -certain medicines for migraine headache like almotriptan, eletriptan, frovatriptan, naratriptan, rizatriptan, sumatriptan, zolmitriptan -certain medicines for seizures like carbamazepine and phenytoin -certain medicines for sleep -certain medicines that treat or prevent blood clots like dalteparin, enoxaparin, warfarin -digoxin -fentanyl -lithium -NSAIDS, medicines for pain and inflammation, like ibuprofen or naproxen -other medicines that prolong the QT interval (cause an abnormal heart rhythm) -rasagiline -supplements like St. John's wort, kava kava, valerian -tramadol -tryptophan This list may not describe all possible interactions. Give your health care provider a list of all the medicines, herbs, non-prescription drugs, or dietary supplements you use. Also tell them if you smoke, drink alcohol, or use illegal drugs. Some items may interact with your medicine. What should I watch for while using this medicine? Tell your doctor if your symptoms do not get better  or if they get worse. Visit your doctor or health care professional for regular checks on your progress. Because it may take several weeks to see the full effects of this medicine, it is important to continue your treatment as prescribed by your doctor. Patients and their families should watch out for new or worsening thoughts of suicide or depression. Also watch out for sudden changes in feelings such as feeling anxious, agitated, panicky, irritable, hostile, aggressive, impulsive, severely restless, overly excited and hyperactive, or not being able to sleep. If this happens, especially at the beginning of treatment or after a change in dose, call your health care professional. Bonita QuinYou may get drowsy or dizzy. Do not drive, use machinery, or do anything that needs mental alertness until you know how this medicine affects you. Do not stand or sit up quickly, especially if you are an older patient. This reduces the risk of dizzy or fainting spells. Alcohol may interfere with the effect of this medicine. Avoid alcoholic drinks. This medicine may cause dry eyes and blurred vision. If you wear contact lenses you may feel some discomfort. Lubricating drops may help. See your eye doctor if the problem does not go away or is severe. Your mouth may get dry. Chewing sugarless gum, sucking hard candy and drinking plenty of water may help. Contact your doctor if the  problem does not go away or is severe. What side effects may I notice from receiving this medicine? Side effects that you should report to your doctor or health care professional as soon as possible: -allergic reactions like skin rash, itching or hives, swelling of the face, lips, or tongue -elevated mood, decreased need for sleep, racing thoughts, impulsive behavior -confusion -fast, irregular heartbeat -feeling faint or lightheaded, falls -feeling agitated, angry, or irritable -loss of balance or coordination -painful or prolonged erections  -restlessness, pacing, inability to keep still -suicidal thoughts or other mood changes -tremors -trouble sleeping -seizures -unusual bleeding or bruising Side effects that usually do not require medical attention (report to your doctor or health care professional if they continue or are bothersome): -change in sex drive or performance -change in appetite or weight -constipation -headache -muscle aches or pains -nausea This list may not describe all possible side effects. Call your doctor for medical advice about side effects. You may report side effects to FDA at 1-800-FDA-1088. Where should I keep my medicine? Keep out of the reach of children. Store at room temperature between 15 and 30 degrees C (59 to 86 degrees F). Protect from light. Keep container tightly closed. Throw away any unused medicine after the expiration date. NOTE: This sheet is a summary. It may not cover all possible information. If you have questions about this medicine, talk to your doctor, pharmacist, or health care provider.  2019 Elsevier/Gold Standard (2017-04-27 17:51:24)

## 2018-07-20 DIAGNOSIS — G4733 Obstructive sleep apnea (adult) (pediatric): Secondary | ICD-10-CM | POA: Diagnosis not present

## 2018-07-26 ENCOUNTER — Encounter: Payer: Self-pay | Admitting: Internal Medicine

## 2018-07-26 NOTE — Progress Notes (Signed)
RESCHEDULED

## 2018-07-28 ENCOUNTER — Ambulatory Visit: Payer: Self-pay | Admitting: Internal Medicine

## 2018-07-28 NOTE — Progress Notes (Signed)
THIS ENCOUNTER IS A VIRTUAL VISIT DUE TO COVID-19 - PATIENT WAS NOT SEEN IN THE OFFICE.  PATIENT HAS CONSENTED TO VIRTUAL VISIT / TELEMEDICINE VISIT  This provider [facility] a [name] using telephone, his appointment was changed to a virtual office visit to reduce the risk of exposure to the COVID-19 virus and to help Jeremy Boone Boone remain healthy and safe. The virtual visit will also provide continuity of care. He verbalizes understanding.   Virtual Visit via telephone Note  I connected with  Jeremy Boone  on 07/29/18  by telephone.  I verified that I am speaking with the correct person using two identifiers.        I discussed the limitations of evaluation and management by telemedicine and the availability of in person appointments. The patient expressed understanding and agreed to proceed.  History of Present Illness:     This very nice 84 y.o. [place]BM from [place] who presents for 6 month follow up with HTN, HLD, Pre-Diabetes and Vitamin D Deficiency. Patient is on CPAP for OSA with reported improved restorative Sleep. Also patient's Gout & GERD is controlled on his meds.       Patient is treated for HTN (1998) & BP has been controlled at home. Today's 115/67 . Patient has had no complaints of any cardiac type chest pain, palpitations, dyspnea / orthopnea / PND, dizziness, claudication, or dependent edema.      Hyperlipidemia is not well controlled with diet & his lopid (Statin Intolerant). Patient denies myalgias or other med SE's. Last Lipids were not at goal: Lab Results  Component Value Date   CHOL 226 (H) 04/28/2018   HDL 66 04/28/2018   LDLCALC 137 (H) 04/28/2018   TRIG 109 04/28/2018   CHOLHDL 3.4 04/28/2018       Also, the patient has history of  PreDiabetes (A1c 6.3% / 2011) and has had no symptoms of reactive hypoglycemia, diabetic polys, paresthesias or visual blurring.  Last A1c was not at goal: Lab Results  Component Value Date   HGBA1C 6.0 (H)  04/28/2018      Further, the patient also has history of Vitamin D Deficiency ("25" / 2008) and supplements vitamin D without any suspected side-effects. Last Vitamin D was at goal: Lab Results  Component Value Date   VD25OH 5670 01/26/2018   Current Outpatient Medications on File Prior to Visit  Medication Sig  . Cholecalciferol (VITAMIN D PO) Take 2,000 Int'l Units by mouth 2 (two) times daily.   . ranitidine (ZANTAC) 150 MG tablet TAKE 1 TABLET BY MOUTH TWO  TIMES DAILY  . sildenafil (REVATIO) 20 MG tablet Take 2 to 5 tablets as needed once daily prior to erection  . traZODone (DESYREL) 150 MG tablet Take 1/3-1 tab daily at bedtime for sleep.   No current facility-administered medications on file prior to visit.    Allergies  Allergen Reactions  . Ace Inhibitors Cough  . Lipitor [Atorvastatin]     Elevates CPK and Aldolase   PMHx:   Past Medical History:  Diagnosis Date  . Gout   . Hyperlipidemia   . Hypertension   . Prediabetes    Immunization History  Administered Date(s) Administered  . Influenza, High Dose Seasonal PF 12/18/2013, 10/28/2015, 12/10/2016, 01/26/2018  . Influenza,inj,Quad PF,6+ Mos 03/07/2013  . Pneumococcal Conjugate-13 03/28/2014  . Pneumococcal-Unspecified 03/03/2007  . Td 03/02/2004  . Tdap 10/25/2014  . Zoster 08/26/2016   Past Surgical History:  Procedure Laterality Date  . Cardiac  Event Monitor  May-June 2017   Sinus rhythm with average heart rate 74.2 - no notable arrhythmias. Minimal PVCs. No PACs.  . CARPAL TUNNEL RELEASE Left 2007  . OTHER SURGICAL HISTORY  2007   Negative biopsy tonsil mass  . ROTATOR CUFF REPAIR Left 2000   FHx:    Reviewed / unchanged  SHx:    Reviewed / unchanged   Systems Review:  Constitutional: Denies fever, chills, wt changes, headaches, insomnia, fatigue, night sweats, change in appetite. Eyes: Denies redness, blurred vision, diplopia, discharge, itchy, watery eyes.  ENT: Denies discharge, congestion,  post nasal drip, epistaxis, sore throat, earache, hearing loss, dental pain, tinnitus, vertigo, sinus pain, snoring.  CV: Denies chest pain, palpitations, irregular heartbeat, syncope, dyspnea, diaphoresis, orthopnea, PND, claudication or edema. Respiratory: denies cough, dyspnea, DOE, pleurisy, hoarseness, laryngitis, wheezing.  Gastrointestinal: Denies dysphagia, odynophagia, heartburn, reflux, water brash, abdominal pain or cramps, nausea, vomiting, bloating, diarrhea, constipation, hematemesis, melena, hematochezia  or hemorrhoids. Genitourinary: Denies dysuria, frequency, urgency, nocturia, hesitancy, discharge, hematuria or flank pain. Musculoskeletal: Denies arthralgias, myalgias, stiffness, jt. swelling, pain, limping or strain/sprain.  Skin: Denies pruritus, rash, hives, warts, acne, eczema or change in skin lesion(s). Neuro: No weakness, tremor, incoordination, spasms, paresthesia or pain. Psychiatric: Denies confusion, memory loss or sensory loss. Endo: Denies change in weight, skin or hair change.  Heme/Lymph: No excessive bleeding, bruising or enlarged lymph nodes.  Physical Exam  BP 115/67   Pulse 65   Temp (!) 97 F (36.1 C)   Resp 12   Ht 5\' 11"  (1.803 m)   Wt 185 lb (83.9 kg)   BMI 25.80 kg/m   General : Well sounding patient in no apparent distress HEENT: no hoarseness, no cough for duration of visit Lungs: speaks in complete sentences, no audible wheezing, no apparent distress Neurological: alert, oriented x 3 Psychiatric: pleasant, judgement appropriate   Assessment and Plan:  1. Essential hypertension  - Continue medication, monitor blood pressure at home.  - Continue DASH diet.  Reminder to go to the ER if any CP,  SOB, nausea, dizziness, severe HA, changes vision/speech.  2. Hyperlipidemia, mixed  - Continue diet/meds, exercise,& lifestyle modifications.  - Continue monitor periodic cholesterol/liver & renal functions   3. Abnormal glucose  -  Continue diet, exercise  - Lifestyle modifications.  - Monitor appropriate labs.  4. Vitamin D deficiency  - Continue supplementation.  5. Idiopathic gout  6. Insomnia, unspecified type  - clonazePAM (KLONOPIN) 2 MG tablet; Take 1/2-1 tablet at bedtime ONLY if needed for Sleep &  limit to 5 days /week to avoid addiction  Dispense: 30 tablet; Refill: 0  7. Medication management        Discussed  regular exercise, BP monitoring, weight control to achieve/maintain BMI less than 25 and discussed med and SE's. Deferred labs til next OV.  I discussed the assessment and treatment plan with the patient. The patient was provided an opportunity to ask questions and all were answered. The patient agreed with the plan and demonstrated an understanding of the instructions. I provided  22 minutes of non-face-to-face time during this encounter and over 28 minutes of exam, counseling, chart review and  complex critical decision making was performed   Marinus Maw, MD

## 2018-07-28 NOTE — Patient Instructions (Signed)

## 2018-07-29 ENCOUNTER — Ambulatory Visit: Payer: Medicare Other | Admitting: Internal Medicine

## 2018-07-29 ENCOUNTER — Encounter: Payer: Self-pay | Admitting: Internal Medicine

## 2018-07-29 VITALS — BP 115/67 | HR 65 | Temp 97.0°F | Resp 12 | Ht 71.0 in | Wt 185.0 lb

## 2018-07-29 DIAGNOSIS — E559 Vitamin D deficiency, unspecified: Secondary | ICD-10-CM

## 2018-07-29 DIAGNOSIS — E782 Mixed hyperlipidemia: Secondary | ICD-10-CM | POA: Diagnosis not present

## 2018-07-29 DIAGNOSIS — M1000 Idiopathic gout, unspecified site: Secondary | ICD-10-CM | POA: Diagnosis not present

## 2018-07-29 DIAGNOSIS — G4700 Insomnia, unspecified: Secondary | ICD-10-CM

## 2018-07-29 DIAGNOSIS — R7309 Other abnormal glucose: Secondary | ICD-10-CM | POA: Diagnosis not present

## 2018-07-29 DIAGNOSIS — I10 Essential (primary) hypertension: Secondary | ICD-10-CM

## 2018-07-29 DIAGNOSIS — Z79899 Other long term (current) drug therapy: Secondary | ICD-10-CM

## 2018-07-29 MED ORDER — HYDROCHLOROTHIAZIDE 25 MG PO TABS: ORAL_TABLET | ORAL | 1 refills | Status: DC

## 2018-07-29 MED ORDER — ALLOPURINOL 300 MG PO TABS: ORAL_TABLET | ORAL | 1 refills | Status: DC

## 2018-07-29 MED ORDER — GEMFIBROZIL 600 MG PO TABS: ORAL_TABLET | ORAL | 1 refills | Status: DC

## 2018-07-29 MED ORDER — ATENOLOL 100 MG PO TABS: ORAL_TABLET | ORAL | 1 refills | Status: DC

## 2018-07-29 MED ORDER — CLONAZEPAM 2 MG PO TABS: ORAL_TABLET | ORAL | 0 refills | Status: DC

## 2018-07-31 ENCOUNTER — Other Ambulatory Visit: Payer: Self-pay | Admitting: Internal Medicine

## 2018-08-01 ENCOUNTER — Other Ambulatory Visit: Payer: Self-pay

## 2018-08-25 DIAGNOSIS — H25812 Combined forms of age-related cataract, left eye: Secondary | ICD-10-CM | POA: Diagnosis not present

## 2018-08-25 DIAGNOSIS — H2512 Age-related nuclear cataract, left eye: Secondary | ICD-10-CM | POA: Diagnosis not present

## 2018-08-25 DIAGNOSIS — H2511 Age-related nuclear cataract, right eye: Secondary | ICD-10-CM | POA: Diagnosis not present

## 2018-09-24 ENCOUNTER — Other Ambulatory Visit: Payer: Self-pay | Admitting: Internal Medicine

## 2018-09-28 DIAGNOSIS — H2511 Age-related nuclear cataract, right eye: Secondary | ICD-10-CM | POA: Diagnosis not present

## 2018-09-28 DIAGNOSIS — H25811 Combined forms of age-related cataract, right eye: Secondary | ICD-10-CM | POA: Diagnosis not present

## 2018-09-29 DIAGNOSIS — G4733 Obstructive sleep apnea (adult) (pediatric): Secondary | ICD-10-CM | POA: Diagnosis not present

## 2018-11-08 ENCOUNTER — Ambulatory Visit: Payer: Self-pay | Admitting: Physician Assistant

## 2018-11-09 NOTE — Progress Notes (Signed)
MEDICARE ANNUAL WELLNESS VISIT AND FOLLOW UP Assessment:    Essential hypertension - continue medications, DASH diet, exercise and monitor at home. Call if greater than 130/80.  -     CBC with Differential/Platelet -     COMPLETE METABOLIC PANEL WITH GFR -     TSH  OSA on CPAP Get on CPAP  Mixed hyperlipidemia check lipids decrease fatty foods increase activity.  -     Lipid panel  Abnormal glucose Discussed disease progression and risks Discussed diet/exercise, weight management and risk modification -     Hemoglobin A1c  Medication management -     Magnesium  Palpitations Controlled  Vitamin D deficiency Continue supplement  History of colonic polyps Declines follow up due to age  Idiopathic gout, unspecified chronicity, unspecified site Gout- recheck Uric acid as needed, Diet discussed, continue medications.  Gastroesophageal reflux disease with esophagitis Continue PPI/H2 blocker, diet discussed  Encounter for Medicare annual wellness exam 1 year  Constipation, unspecified constipation type States it has improved, declines a colonoscopy or cologuard due to age  Peripheral neuropathy  Left leg with neuropathy, likely from his back Will try gabapentin he has at home  Future Appointments  Date Time Provider Department Center  02/09/2019  3:45 PM Jeremy Cowboy, MD [facility] None  11/20/2019  2:00 PM Jeremy Mulling, PA-C [facility] None     Plan:   During the course of the visit the patient was educated and counseled about appropriate screening and preventive services including:    Pneumococcal vaccine   Influenza vaccine  Td vaccine  Screening electrocardiogram  Colorectal cancer screening  Diabetes screening  Glaucoma screening  Nutrition counseling    Subjective:  Jeremy Boone is a AA 84 y.o. male who presents for Medicare Annual Wellness Visit and 3 month follow up for HTN, hyperlipidemia, prediabetes, and vitamin D Def.    His blood pressure has been controlled at home, today their BP is BP: 122/80 He does workout. He denies chest pain, , dizziness.  He is on cholesterol medication, lopid 600 once daily due to intolerance of statins due to elevated CPK/aldolase and denies myalgias. His cholesterol is not at goal. The cholesterol last visit was:   Lab Results  Component Value Date   CHOL 234 (H) 11/10/2018   HDL 63 11/10/2018   LDLCALC 147 (H) 11/10/2018   TRIG 120 11/10/2018   CHOLHDL 3.7 11/10/2018  He has been working on diet and exercise for prediabetes, and denies paresthesia of the feet, polydipsia, polyuria and visual disturbances. Last A1C in the office was:  Lab Results  Component Value Date   HGBA1C 6.0 (H) 11/10/2018  Patient is on Vitamin D supplement.   Lab Results  Component Value Date   VD25OH 22 11/10/2018   He has ED and uses viagra PRN. Patient is on allopurinol for gout and does not report a recent flare.   He is on klonopin as needed for insomnia, not using CPAP, states he can not wear it. He also complains of his left foot being very cold at night and back pain. He has had normal ABI and does not complain of claudication.   BMI is Body mass index is 25.8 kg/m., he is working on diet and exercise. Wt Readings from Last 3 Encounters:  11/10/18 185 lb (83.9 kg)  07/29/18 185 lb (83.9 kg)  04/28/18 185 lb (83.9 kg)     Names of Other Physician/Practitioners you currently use: 1. [facility] here  for primary care Patient Care Team: Jeremy CowboyWilliam McKeown, MD as PCP - General (Internal Medicine) Jeremy Benesharles R Epes, MD as Consulting Physician (Ophthalmology)-  August and July had cataracts removed Jeremy SchmidtJames P Aplington, MD as Consulting Physician (Orthopedic Surgery) Jeremy Meckelobert D Kaplan, MD as Consulting Physician (Gastroenterology)  Medication Review: Current Outpatient Medications on File Prior to Visit  Medication Sig Dispense Refill  . allopurinol  (ZYLOPRIM) 300 MG tablet Take 1 tablet Daily to Prevent Gout 90 tablet 3  . atenolol (TENORMIN) 100 MG tablet Take 1 tablet Daily for BP 90 tablet 3  . Cholecalciferol (VITAMIN D PO) Take 2,000 Int'l Units by mouth 2 (two) times daily.     . clonazePAM (KLONOPIN) 2 MG tablet Take 1/2-1 tablet at bedtime ONLY if needed for Sleep &  limit to 5 days /week to avoid addiction 30 tablet 0  . gemfibrozil (LOPID) 600 MG tablet Take 1 tablet 2 x /day with meals for Cholesterol 180 tablet 3  . hydrochlorothiazide (HYDRODIURIL) 25 MG tablet Take 1 tablet Daily for BP & Fluid TAKE 1 TABLET BY MOUTH  DAILY 90 tablet 3  . sildenafil (REVATIO) 20 MG tablet Take 2 to 5 tablets as needed once daily prior to erection 90 tablet 3  . traZODone (DESYREL) 150 MG tablet Take 1/3-1 tab daily at bedtime for sleep. 30 tablet 2   No current facility-administered medications on file prior to visit.     Current Problems (verified) Patient Active Problem List   Diagnosis Date Noted  . Constipation 12/15/2017  . Palpitations 07/31/2015  . OSA on CPAP 04/19/2015  . Vitamin D deficiency 09/14/2013  . Medication management 09/14/2013  . History of colonic polyps 08/02/2013  . GERD  08/02/2013  . Hyperlipidemia, mixed   . Essential hypertension   . Gout   . Abnormal glucose     Screening Tests Immunization History  Administered Date(s) Administered  . Influenza, High Dose Seasonal PF 12/18/2013, 10/28/2015, 12/10/2016, 01/26/2018, 11/10/2018  . Influenza,inj,Quad PF,6+ Mos 03/07/2013  . Pneumococcal Conjugate-13 03/28/2014  . Pneumococcal-Unspecified 03/03/2007  . Td 03/02/2004  . Tdap 10/25/2014  . Zoster 08/26/2016    Preventative care: Last colonoscopy: 10/13/07 polyps, diverticulosis DECLINES DUE TO AGE EGD: 2015 Dr. Arlyce DiceKaplan CXR: 2007 CT head 2010 CT lumbar 2009  Prior vaccinations: TD or Tdap: 2016 Influenza: 2020 TODAY Pneumococcal: 2009 Prevnar 13: 2016 Shingles/Zostavax: 2018  History  reviewed: allergies, current medications, past family history, past medical history, past social history, past surgical history and problem list  Allergies Allergies  Allergen Reactions  . Ace Inhibitors Cough  . Lipitor [Atorvastatin]     Elevates CPK and Aldolase    SURGICAL HISTORY He  has a past surgical history that includes Carpal tunnel release (Left, 2007); Rotator cuff repair (Left, 2000); Other surgical history (2007); and Cardiac Event Monitor (May-June 2017). FAMILY HISTORY His family history includes Hypertension in his mother; Seizures in his sister; Stroke in his father. SOCIAL HISTORY He  reports that he has never smoked. He has never used smokeless tobacco. He reports current alcohol use of about 15.0 standard drinks of alcohol per week. He reports that he does not use drugs.  MEDICARE WELLNESS OBJECTIVES: Physical activity: Current Exercise Habits: The patient does not participate in regular exercise at present Cardiac risk factors: Cardiac Risk Factors include: advanced age (>[id number], 76>65 women);dyslipidemia;hypertension;male gender;sedentary lifestyle Depression/mood screen:   [facility] 11/10/2018  Decreased Interest 0  Down, Depressed, Hopeless 0  PHQ - 2 Score 0  ADLs:  In your present state of health, do you have any difficulty performing the following activities: 11/10/2018 07/29/2018  Hearing? Y N  Vision? N N  Difficulty concentrating or making decisions? Y N  Walking or climbing stairs? N N  Dressing or bathing? N N  Doing errands, shopping? N N  Some recent data might be hidden     Cognitive Testing  Alert? Yes  Normal Appearance?Yes  Oriented to person? Yes  Place? Yes   Time? Yes  Recall of three objects?  Yes  Can perform simple calculations? Yes  Displays appropriate judgment?Yes  Can read the correct time from a watch face?Yes  EOL planning: Does Patient Have a Medical Advance Directive?: Yes Does patient want to make  changes to medical advance directive?: No - Patient declined   Objective:   Blood pressure 122/80, pulse 62, temperature 97.8 F (36.6 C), height 5\' 11"  (1.803 m), weight 185 lb (83.9 kg), SpO2 99 %. Body mass index is 25.8 kg/m.  General appearance: alert, no distress, WD/WN, male HEENT: normocephalic, sclerae anicteric, TMs pearly, nares patent, no discharge or erythema, pharynx normal Oral cavity: MMM, no lesions Neck: supple, no lymphadenopathy, no thyromegaly, no masses Heart: RRR, normal S1, S2, no murmurs Lungs: CTA bilaterally, no wheezes, rhonchi, or rales Abdomen: +bs, soft, mild diffuse tenderness, non distended, no masses, no hepatomegaly, no splenomegaly Musculoskeletal: nontender, no swelling, no obvious deformity Extremities: no edema, no cyanosis, no clubbing Pulses: 2+ symmetric, upper and lower extremities, normal cap refill Neurological: alert, oriented x 3, CN2-12 intact, strength normal upper extremities and lower extremities, sensation normal throughout except left leg decrease sensation in L5/S1 distribution,, DTRs 2+ throughout, no cerebellar signs, gait normal Psychiatric: normal affect, behavior normal, pleasant   Medicare Attestation I have personally reviewed: The patient's medical and social history Their use of alcohol, tobacco or illicit drugs Their current medications and supplements The patient's functional ability including ADLs,fall risks, home safety risks, cognitive, and hearing and visual impairment Diet and physical activities Evidence for depression or mood disorders  The patient's weight, height, BMI, and visual acuity have been recorded in the chart.  I have made referrals, counseling, and provided education to the patient based on review of the above and I have provided the patient with a written personalized care plan for preventive services.     Vicie Mutters, PA-C   11/11/2018

## 2018-11-10 ENCOUNTER — Ambulatory Visit (INDEPENDENT_AMBULATORY_CARE_PROVIDER_SITE_OTHER): Payer: Medicare Other | Admitting: Physician Assistant

## 2018-11-10 ENCOUNTER — Encounter: Payer: Self-pay | Admitting: Physician Assistant

## 2018-11-10 ENCOUNTER — Other Ambulatory Visit: Payer: Self-pay

## 2018-11-10 VITALS — BP 122/80 | HR 62 | Temp 97.8°F | Ht 71.0 in | Wt 185.0 lb

## 2018-11-10 DIAGNOSIS — Z79899 Other long term (current) drug therapy: Secondary | ICD-10-CM | POA: Diagnosis not present

## 2018-11-10 DIAGNOSIS — M1000 Idiopathic gout, unspecified site: Secondary | ICD-10-CM

## 2018-11-10 DIAGNOSIS — Z23 Encounter for immunization: Secondary | ICD-10-CM

## 2018-11-10 DIAGNOSIS — G4733 Obstructive sleep apnea (adult) (pediatric): Secondary | ICD-10-CM | POA: Diagnosis not present

## 2018-11-10 DIAGNOSIS — R002 Palpitations: Secondary | ICD-10-CM

## 2018-11-10 DIAGNOSIS — K2100 Gastro-esophageal reflux disease with esophagitis, without bleeding: Secondary | ICD-10-CM

## 2018-11-10 DIAGNOSIS — I10 Essential (primary) hypertension: Secondary | ICD-10-CM | POA: Diagnosis not present

## 2018-11-10 DIAGNOSIS — Z0001 Encounter for general adult medical examination with abnormal findings: Secondary | ICD-10-CM

## 2018-11-10 DIAGNOSIS — Z0000 Encounter for general adult medical examination without abnormal findings: Secondary | ICD-10-CM

## 2018-11-10 DIAGNOSIS — E782 Mixed hyperlipidemia: Secondary | ICD-10-CM

## 2018-11-10 DIAGNOSIS — R7309 Other abnormal glucose: Secondary | ICD-10-CM

## 2018-11-10 DIAGNOSIS — Z86010 Personal history of colonic polyps: Secondary | ICD-10-CM

## 2018-11-10 DIAGNOSIS — R6889 Other general symptoms and signs: Secondary | ICD-10-CM | POA: Diagnosis not present

## 2018-11-10 DIAGNOSIS — E559 Vitamin D deficiency, unspecified: Secondary | ICD-10-CM

## 2018-11-10 DIAGNOSIS — K5900 Constipation, unspecified: Secondary | ICD-10-CM

## 2018-11-10 DIAGNOSIS — Z9989 Dependence on other enabling machines and devices: Secondary | ICD-10-CM

## 2018-11-10 NOTE — Patient Instructions (Addendum)
[employer] with no obligation # [phone] Do not have to be a member Tues-Sat 10-6  [place]- free test with no obligation # [phone] MUST BE A MEMBER Call for store hours  Have had patient's get good cheaper hearing aids from [employer] The air version has good reviews.   Can take the gabapentin 300mg  at night.   It can make you sleepy so we suggest trying it at night first and please plan to not drive or do anything strenuous. TAKE 1-3 HOURS BEFORE BED, EACH PERSON METABOLIZES IT DIFFERENTLY SO YOU CAN PLAY WITH THE TIMING  Also please do not take this medication with alcohol.   Start out 1 pill at night before bed, can increase to 2 pills at night before bed. Please call the office if you have any side effects.    Common side effects are sleepiness, concentration problems, dizziness, swelling.    Peripheral Neuropathy Peripheral neuropathy is a type of nerve damage. It affects nerves that carry signals between the spinal cord and the arms, legs, and the rest of the body (peripheral nerves). It does not affect nerves in the spinal cord or brain. In peripheral neuropathy, one nerve or a group of nerves may be damaged. Peripheral neuropathy is a broad category that includes many specific nerve disorders, like diabetic neuropathy, hereditary neuropathy, and carpal tunnel syndrome. What are the causes? This condition may be caused by:  Diabetes. This is the most common cause of peripheral neuropathy.  Nerve injury.  Pressure or stress on a nerve that lasts a long time.  Lack (deficiency) of B vitamins. This can result from alcoholism, poor diet, or a restricted diet.  Infections.  Autoimmune diseases, such as rheumatoid arthritis and systemic lupus erythematosus.  Nerve diseases that are passed from parent to child (inherited).  Some medicines, such as cancer medicines (chemotherapy).  Poisonous (toxic) substances, such as lead and  mercury.  Too little blood flowing to the legs.  Kidney disease.  Thyroid disease. In some cases, the cause of this condition is not known. What are the signs or symptoms? Symptoms of this condition depend on which of your nerves is damaged. Common symptoms include:  Loss of feeling (numbness) in the feet, hands, or both.  Tingling in the feet, hands, or both.  Burning pain.  Very sensitive skin.  Weakness.  Not being able to move a part of the body (paralysis).  Muscle twitching.  Clumsiness or poor coordination.  Loss of balance.  Not being able to control your bladder.  Feeling dizzy.  Sexual problems. How is this diagnosed? Diagnosing and finding the cause of peripheral neuropathy can be difficult. Your health care provider will take your medical history and do a physical exam. A neurological exam will also be done. This involves checking things that are affected by your brain, spinal cord, and nerves (nervous system). For example, your health care provider will check your reflexes, how you move, and what you can feel. You may have other tests, such as:  Blood tests.  Electromyogram (EMG) and nerve conduction tests. These tests check nerve function and how well the nerves are controlling the muscles.  Imaging tests, such as CT scans or MRI to rule out other causes of your symptoms.  Removing a small piece of nerve to be examined in a lab (nerve biopsy). This is rare.  Removing and examining a small amount of the fluid that surrounds the brain and spinal cord (lumbar  puncture). This is rare. How is this treated? Treatment for this condition may involve:  Treating the underlying cause of the neuropathy, such as diabetes, kidney disease, or vitamin deficiencies.  Stopping medicines that can cause neuropathy, such as chemotherapy.  Medicine to relieve pain. Medicines may include: ? Prescription or over-the-counter pain medicine. ? Antiseizure  medicine. ? Antidepressants. ? Pain-relieving patches that are applied to painful areas of skin.  Surgery to relieve pressure on a nerve or to destroy a nerve that is causing pain.  Physical therapy to help improve movement and balance.  Devices to help you move around (assistive devices). Follow these instructions at home: Medicines  Take over-the-counter and prescription medicines only as told by your health care provider. Do not take any other medicines without first asking your health care provider.  Do not drive or use heavy machinery while taking prescription pain medicine. Lifestyle   Do not use any products that contain nicotine or tobacco, such as cigarettes and e-cigarettes. Smoking keeps blood from reaching damaged nerves. If you need help quitting, ask your health care provider.  Avoid or limit alcohol. Too much alcohol can cause a vitamin B deficiency, and vitamin B is needed for healthy nerves.  Eat a healthy diet. This includes: ? Eating foods that are high in fiber, such as fresh fruits and vegetables, whole grains, and beans. ? Limiting foods that are high in fat and processed sugars, such as fried or sweet foods. General instructions   If you have diabetes, work closely with your health care provider to keep your blood sugar under control.  If you have numbness in your feet: ? Check every day for signs of injury or infection. Watch for redness, warmth, and swelling. ? Wear padded socks and comfortable shoes. These help protect your feet.  Develop a good support system. Living with peripheral neuropathy can be stressful. Consider talking with a mental health specialist or joining a support group.  Use assistive devices and attend physical therapy as told by your health care provider. This may include using a walker or a cane.  Keep all follow-up visits as told by your health care provider. This is important. Contact a health care provider if:  You have new  signs or symptoms of peripheral neuropathy.  You are struggling emotionally from dealing with peripheral neuropathy.  Your pain is not well-controlled. Get help right away if:  You have an injury or infection that is not healing normally.  You develop new weakness in an arm or leg.  You fall frequently. Summary  Peripheral neuropathy is when the nerves in the arms, or legs are damaged, resulting in numbness, weakness, or pain.  There are many causes of peripheral neuropathy, including diabetes, pinched nerves, vitamin deficiencies, autoimmune disease, and hereditary conditions.  Diagnosing and finding the cause of peripheral neuropathy can be difficult. Your health care provider will take your medical history, do a physical exam, and do tests, including blood tests and nerve function tests.  Treatment involves treating the underlying cause of the neuropathy and taking medicines to help control pain. Physical therapy and assistive devices may also help. This information is not intended to replace advice given to you by your health care provider. Make sure you discuss any questions you have with your health care provider. Document Released: 02/06/2002 Document Revised: 01/29/2017 Document Reviewed: 04/27/2016 Elsevier Patient Education  2020 [employer]

## 2018-11-11 LAB — COMPLETE METABOLIC PANEL WITH GFR
AG Ratio: 1.3 (calc) (ref 1.0–2.5)
ALT: 8 U/L — ABNORMAL LOW (ref 9–46)
AST: 20 U/L (ref 10–35)
Albumin: 4.4 g/dL (ref 3.6–5.1)
Alkaline phosphatase (APISO): 65 U/L (ref 35–144)
BUN: 22 mg/dL (ref 7–25)
CO2: 27 mmol/L (ref 20–32)
Calcium: 9.5 mg/dL (ref 8.6–10.3)
Chloride: 100 mmol/L (ref 98–110)
Creat: 1.04 mg/dL (ref 0.70–1.11)
GFR, Est African American: 77 mL/min/{1.73_m2} (ref >=60)
GFR, Est Non African American: 67 mL/min/{1.73_m2} (ref >=60)
Globulin: 3.3 g/dL (calc) (ref 1.9–3.7)
Glucose, Bld: 89 mg/dL (ref 65–99)
Potassium: 4.1 mmol/L (ref 3.5–5.3)
Sodium: 136 mmol/L (ref 135–146)
Total Bilirubin: 0.3 mg/dL (ref 0.2–1.2)
Total Protein: 7.7 g/dL (ref 6.1–8.1)

## 2018-11-11 LAB — HEMOGLOBIN A1C
Hgb A1c MFr Bld: 6 % of total Hgb — ABNORMAL HIGH (ref <5.7)
Mean Plasma Glucose: 126 (calc)
eAG (mmol/L): 7 (calc)

## 2018-11-11 LAB — TSH: TSH: 2.81 mIU/L (ref 0.40–4.50)

## 2018-11-11 LAB — LIPID PANEL
Cholesterol: 234 mg/dL — ABNORMAL HIGH (ref <200)
HDL: 63 mg/dL (ref >=40)
LDL Cholesterol (Calc): 147 mg/dL (calc) — ABNORMAL HIGH
Non-HDL Cholesterol (Calc): 171 mg/dL (calc) — ABNORMAL HIGH (ref <130)
Total CHOL/HDL Ratio: 3.7 (calc) (ref <5.0)
Triglycerides: 120 mg/dL (ref <150)

## 2018-11-11 LAB — CBC WITH DIFFERENTIAL/PLATELET
Absolute Monocytes: 589 cells/uL (ref 200–950)
Basophils Absolute: 41 cells/uL (ref 0–200)
Basophils Relative: 0.9 %
Eosinophils Absolute: 409 cells/uL (ref 15–500)
Eosinophils Relative: 8.9 %
HCT: 43.3 % (ref 38.5–50.0)
Hemoglobin: 14.3 g/dL (ref 13.2–17.1)
Lymphs Abs: 1955 cells/uL (ref 850–3900)
MCH: 30.4 pg (ref 27.0–33.0)
MCHC: 33 g/dL (ref 32.0–36.0)
MCV: 92.1 fL (ref 80.0–100.0)
MPV: 9.9 fL (ref 7.5–12.5)
Monocytes Relative: 12.8 %
Neutro Abs: 1605 cells/uL (ref 1500–7800)
Neutrophils Relative %: 34.9 %
Platelets: 272 10*3/uL (ref 140–400)
RBC: 4.7 10*6/uL (ref 4.20–5.80)
RDW: 12.8 % (ref 11.0–15.0)
Total Lymphocyte: 42.5 %
WBC: 4.6 10*3/uL (ref 3.8–10.8)

## 2018-11-11 LAB — MAGNESIUM: Magnesium: 2.1 mg/dL (ref 1.5–2.5)

## 2018-11-11 LAB — VITAMIN D 25 HYDROXY (VIT D DEFICIENCY, FRACTURES): Vit D, 25-Hydroxy: 58 ng/mL (ref 30–100)

## 2018-12-29 DIAGNOSIS — G4733 Obstructive sleep apnea (adult) (pediatric): Secondary | ICD-10-CM | POA: Diagnosis not present

## 2019-02-08 ENCOUNTER — Encounter: Payer: Self-pay | Admitting: Internal Medicine

## 2019-02-08 ENCOUNTER — Encounter: Payer: Medicare Other | Admitting: Internal Medicine

## 2019-02-08 DIAGNOSIS — R7303 Prediabetes: Secondary | ICD-10-CM | POA: Insufficient documentation

## 2019-02-08 MED ORDER — HYDROCHLOROTHIAZIDE 25 MG PO TABS: ORAL_TABLET | ORAL | 3 refills | Status: DC

## 2019-02-08 MED ORDER — SILDENAFIL CITRATE 20 MG PO TABS: ORAL_TABLET | ORAL | 3 refills | Status: DC

## 2019-02-08 NOTE — Patient Instructions (Signed)

## 2019-02-08 NOTE — Progress Notes (Addendum)
Annual  Screening/Preventative Visit  & Comprehensive Evaluation & Examination     This very nice [age] y.o. MBM  from [place] presents for a Screening /Preventative Visit & comprehensive evaluation and management of multiple medical co-morbidities.  Patient has been followed for HTN, HLD, Prediabetes and Vitamin D Deficiency. Patient's Gout is quiescent on his Allopurinol. Patient also has OSA on CPAP with improved restorative sleep.      HTN predates since 1998. Patient's BP has been controlled at home.  Today's BP: 126/62. Patient denies any cardiac symptoms as chest pain, palpitations, shortness of breath, dizziness or ankle swelling.     Patient is allergic to statins and his  hyperlipidemia is not controlled with diet and Gemfibrozil. Patient denies myalgias or other medication SE's. Last lipids were not at goal:  Lab Results  Component Value Date   CHOL 234 (H) 11/10/2018   HDL 63 11/10/2018   LDLCALC 147 (H) 11/10/2018   TRIG 120 11/10/2018   CHOLHDL 3.7 11/10/2018       Patient has hx/o prediabetes (A1c 6.3% / 2011)  and patient denies reactive hypoglycemic symptoms, visual blurring, diabetic polys or paresthesias. Last A1c was not at goal:  Lab Results  Component Value Date   HGBA1C 6.0 (H) 11/10/2018        Finally, patient has history of Vitamin D Deficiency ("25" / 2008) and last vitamin D was at goal:  Lab Results  Component Value Date   VD25OH 58 11/10/2018    Current Outpatient Medications on File Prior to Visit  Medication Sig  . allopurinol (ZYLOPRIM) 300 MG tablet Take 1 tablet Daily to Prevent Gout  . atenolol (TENORMIN) 100 MG tablet Take 1 tablet Daily for BP  . Cholecalciferol (VITAMIN D PO) Take 2,000 Int'l Units by mouth 2 (two) times daily.   Marland Kitchen. gemfibrozil (LOPID) 600 MG tablet Take 1 tablet 2 x /day with meals for Cholesterol  . clonazePAM (KLONOPIN) 2 MG tablet Take 1/2-1 tablet at bedtime ONLY if needed for Sleep &  limit to 5 days /week to avoid  addiction (Patient not taking: Reported on 02/09/2019)  . traZODone (DESYREL) 150 MG tablet Take 1/3-1 tab daily at bedtime for sleep.   No current facility-administered medications on file prior to visit.   Allergies  Allergen Reactions  . Ace Inhibitors Cough  . Lipitor [Atorvastatin]     Elevates CPK and Aldolase   Past Medical History:  Diagnosis Date  . Gout   . Hyperlipidemia   . Hypertension   . Prediabetes    Health Maintenance  Topic Date Due  . URINE MICROALBUMIN  01/27/2019  . TETANUS/TDAP  10/24/2024  . INFLUENZA VACCINE  Completed  . PNA vac Low Risk Adult  Completed   Immunization History  Administered Date(s) Administered  . Influenza, High Dose Seasonal PF 12/18/2013, 10/28/2015, 12/10/2016, 01/26/2018, 11/10/2018  . Influenza,inj,Quad PF,6+ Mos 03/07/2013  . Pneumococcal Conjugate-13 03/28/2014  . Pneumococcal-Unspecified 03/03/2007  . Td 03/02/2004  . Tdap 10/25/2014  . Zoster 08/26/2016   Last Colon - 10/13/2007 - Dr Arlyce DiceKaplan - hyperplastic polyps  Past Surgical History:  Procedure Laterality Date  . Cardiac Event Monitor  May-June 2017   Sinus rhythm with average heart rate 74.2 - no notable arrhythmias. Minimal PVCs. No PACs.  . CARPAL TUNNEL RELEASE Left 2007  . OTHER SURGICAL HISTORY  2007   Negative biopsy tonsil mass  . ROTATOR CUFF REPAIR Left 2000   Family History  Problem Relation Age of Onset  .  Hypertension Mother   . Stroke Father   . Seizures Sister   . Colon cancer Neg Hx   . Stomach cancer Neg Hx   . Esophageal cancer Neg Hx   . Inflammatory bowel disease Neg Hx   . Liver disease Neg Hx   . Pancreatic cancer Neg Hx   . Rectal cancer Neg Hx    Social History   Socioeconomic History  . Marital status: Married    Spouse name: Mary  . Number of children: 1 son  Occupational History  . 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 Smoking status: Never Smoker  . Smokeless tobacco: Never Used  Substance  and Sexual Activity  . Alcohol use: Yes    Alcohol/week: 15.0 standard drinks    Types: 15 Standard drinks or equivalent per week    Comment: every day 2-3 drimks a day   . Drug use: No  . Sexual activity: Not on file    ROS Constitutional: Denies fever, chills, weight loss/gain, headaches, insomnia,  night sweats or change in appetite. Does c/o fatigue. Eyes: Denies redness, blurred vision, diplopia, discharge, itchy or watery eyes.  ENT: Denies discharge, congestion, post nasal drip, epistaxis, sore throat, earache, hearing loss, dental pain, Tinnitus, Vertigo, Sinus pain or snoring.  Cardio: Denies chest pain, palpitations, irregular heartbeat, syncope, dyspnea, diaphoresis, orthopnea, PND, claudication or edema Respiratory: denies cough, dyspnea, DOE, pleurisy, hoarseness, laryngitis or wheezing.  Gastrointestinal: Denies dysphagia, heartburn, reflux, water brash, pain, cramps, nausea, vomiting, bloating, diarrhea, constipation, hematemesis, melena, hematochezia, jaundice or hemorrhoids Genitourinary: Denies dysuria, frequency, urgency, nocturia, hesitancy, discharge, hematuria or flank pain Musculoskeletal: Denies arthralgia, myalgia, stiffness, Jt. Swelling, pain, limp or strain/sprain. Denies Falls. Skin: Denies puritis, rash, hives, warts, acne, eczema or change in skin lesion Neuro: No weakness, tremor, incoordination, spasms, paresthesia or pain Psychiatric: Denies confusion, memory loss or sensory loss. Denies Depression. Endocrine: Denies change in weight, skin, hair change, nocturia, and paresthesia, diabetic polys, visual blurring or hyper / hypo glycemic episodes.  Heme/Lymph: No excessive bleeding, bruising or enlarged lymph nodes.  Physical Exam  BP 126/62   Pulse 68   Temp (!) 96.9 F (36.1 C)   Resp 16   Ht 5\' 11"  (1.803 m)   Wt 186 lb 3.2 oz (84.5 kg)   BMI 25.97 kg/m   General Appearance: Well nourished and well groomed and in no apparent distress.  Eyes:  PERRLA, EOMs, conjunctiva no swelling or erythema, normal fundi and vessels. Sinuses: No frontal/maxillary tenderness ENT/Mouth: EACs patent / TMs  nl. Nares clear without erythema, swelling, mucoid exudates. Oral hygiene is good. No erythema, swelling, or exudate. Tongue normal, non-obstructing. Tonsils not swollen or erythematous. Hearing normal.  Neck: Supple, thyroid not palpable. No bruits, nodes or JVD. Respiratory: Respiratory effort normal.  BS equal and clear bilateral without rales, rhonci, wheezing or stridor. Cardio: Heart sounds are normal with regular rate and rhythm and no murmurs, rubs or gallops. Peripheral pulses are normal and equal bilaterally without edema. No aortic or femoral bruits. Chest: symmetric with normal excursions and percussion.  Abdomen: Soft, with Nl bowel sounds. Nontender, no guarding, rebound, hernias, masses, or organomegaly.  Lymphatics: Non tender without lymphadenopathy.  Musculoskeletal: Full ROM all peripheral extremities, joint stability, 5/5 strength, and normal gait. Skin: Warm and dry without rashes, lesions, cyanosis, clubbing or  ecchymosis.  Neuro: Cranial nerves intact, reflexes equal bilaterally. Normal muscle tone, no cerebellar symptoms. Sensation intact.  Pysch: Alert and oriented X 3  with normal affect, insight and judgment appropriate.   Assessment and Plan  1. Annual Preventative/Screening Exam   2. Essential hypertension  - EKG 12-Lead - [place], retroperitnl abd,  ltd - Urinalysis, Routine w reflex microscopic - Microalbumin / Creatinine Urine Ratio - CBC with Diff - COMPLETE METABOLIC PANEL WITH GFR - Magnesium - TSH  3. Hyperlipidemia, mixed  - EKG 12-Lead - [place], retroperitnl abd,  ltd - Lipid Profile - TSH  4. Abnormal glucose  - EKG 12-Lead - [place], retroperitnl abd,  ltd - Hemoglobin A1c ([facility]) - Insulin, random  5. Vitamin D deficiency  - Vitamin D (25 hydroxy)  6. Prediabetes  - EKG 12-Lead - [place],  retroperitnl abd,  ltd - Hemoglobin A1c ([facility]) - Insulin, random  7. OSA on CPAP   8. Idiopathic gout  - Uric acid  9. Gastroesophageal reflux disease  - CBC with Diff  10. BPH with obstruction/lower urinary tract symptoms  - PSA  11. Prostate cancer screening  - PSA  12. Screening for colorectal cancer  - POC Hemoccult Bld/Stl  13. Screening for ischemic heart disease  - EKG 12-Lead  14. FH: hypertension  - EKG 12-Lead - [place], retroperitnl abd,  ltd  15. Screening for AAA (aortic abdominal aneurysm)  - [place], retroperitnl abd,  ltd  16. Medication management  - sildenafil (REVATIO) 20 MG tablet; Take 2 to 5 tablets Daily if needed for XXXX  Dispense: 90 tablet; Refill: 3 - Urinalysis, Routine w reflex microscopic - Microalbumin / Creatinine Urine Ratio - Uric acid - CBC with Diff - COMPLETE METABOLIC PANEL WITH GFR - Magnesium - Lipid Profile - TSH - Hemoglobin A1c ([facility]) - Insulin, random - Vitamin D (25 hydroxy)         Patient was counseled in prudent diet, weight control to achieve/maintain BMI less than 25, BP monitoring, regular exercise and medications as discussed.  Discussed med effects and SE's. Routine screening labs and tests as requested with regular follow-up as recommended. Over 40 minutes of exam, counseling, chart review and high complex critical decision making was performed   Marinus Maw, MD

## 2019-02-09 ENCOUNTER — Other Ambulatory Visit: Payer: Self-pay

## 2019-02-09 ENCOUNTER — Ambulatory Visit (INDEPENDENT_AMBULATORY_CARE_PROVIDER_SITE_OTHER): Payer: Medicare Other | Admitting: Internal Medicine

## 2019-02-09 VITALS — BP 126/62 | HR 68 | Temp 96.9°F | Resp 16 | Ht 71.0 in | Wt 186.2 lb

## 2019-02-09 DIAGNOSIS — E559 Vitamin D deficiency, unspecified: Secondary | ICD-10-CM | POA: Diagnosis not present

## 2019-02-09 DIAGNOSIS — Z1212 Encounter for screening for malignant neoplasm of rectum: Secondary | ICD-10-CM

## 2019-02-09 DIAGNOSIS — G4733 Obstructive sleep apnea (adult) (pediatric): Secondary | ICD-10-CM

## 2019-02-09 DIAGNOSIS — R7303 Prediabetes: Secondary | ICD-10-CM

## 2019-02-09 DIAGNOSIS — M1000 Idiopathic gout, unspecified site: Secondary | ICD-10-CM

## 2019-02-09 DIAGNOSIS — Z79899 Other long term (current) drug therapy: Secondary | ICD-10-CM

## 2019-02-09 DIAGNOSIS — Z125 Encounter for screening for malignant neoplasm of prostate: Secondary | ICD-10-CM

## 2019-02-09 DIAGNOSIS — Z0000 Encounter for general adult medical examination without abnormal findings: Secondary | ICD-10-CM

## 2019-02-09 DIAGNOSIS — Z136 Encounter for screening for cardiovascular disorders: Secondary | ICD-10-CM

## 2019-02-09 DIAGNOSIS — E782 Mixed hyperlipidemia: Secondary | ICD-10-CM | POA: Diagnosis not present

## 2019-02-09 DIAGNOSIS — I10 Essential (primary) hypertension: Secondary | ICD-10-CM | POA: Diagnosis not present

## 2019-02-09 DIAGNOSIS — N138 Other obstructive and reflux uropathy: Secondary | ICD-10-CM

## 2019-02-09 DIAGNOSIS — K219 Gastro-esophageal reflux disease without esophagitis: Secondary | ICD-10-CM

## 2019-02-09 DIAGNOSIS — R7309 Other abnormal glucose: Secondary | ICD-10-CM

## 2019-02-09 DIAGNOSIS — Z1211 Encounter for screening for malignant neoplasm of colon: Secondary | ICD-10-CM

## 2019-02-09 DIAGNOSIS — Z8249 Family history of ischemic heart disease and other diseases of the circulatory system: Secondary | ICD-10-CM

## 2019-02-09 DIAGNOSIS — Z0001 Encounter for general adult medical examination with abnormal findings: Secondary | ICD-10-CM

## 2019-02-09 DIAGNOSIS — Z9989 Dependence on other enabling machines and devices: Secondary | ICD-10-CM

## 2019-02-10 LAB — MICROALBUMIN / CREATININE URINE RATIO
Creatinine, Urine: 144 mg/dL (ref 20–320)
Microalb Creat Ratio: 3 mcg/mg creat (ref <30)
Microalb, Ur: 0.4 mg/dL

## 2019-02-10 LAB — INSULIN, RANDOM: Insulin: 39.6 u[IU]/mL — ABNORMAL HIGH

## 2019-02-10 LAB — CBC WITH DIFFERENTIAL/PLATELET
Absolute Monocytes: 578 cells/uL (ref 200–950)
Basophils Absolute: 42 cells/uL (ref 0–200)
Basophils Relative: 0.8 %
Eosinophils Absolute: 504 cells/uL — ABNORMAL HIGH (ref 15–500)
Eosinophils Relative: 9.5 %
HCT: 43.6 % (ref 38.5–50.0)
Hemoglobin: 14.7 g/dL (ref 13.2–17.1)
Lymphs Abs: 2364 cells/uL (ref 850–3900)
MCH: 30.8 pg (ref 27.0–33.0)
MCHC: 33.7 g/dL (ref 32.0–36.0)
MCV: 91.2 fL (ref 80.0–100.0)
MPV: 10.3 fL (ref 7.5–12.5)
Monocytes Relative: 10.9 %
Neutro Abs: 1813 cells/uL (ref 1500–7800)
Neutrophils Relative %: 34.2 %
Platelets: 228 10*3/uL (ref 140–400)
RBC: 4.78 10*6/uL (ref 4.20–5.80)
RDW: 12.7 % (ref 11.0–15.0)
Total Lymphocyte: 44.6 %
WBC: 5.3 10*3/uL (ref 3.8–10.8)

## 2019-02-10 LAB — URINALYSIS, ROUTINE W REFLEX MICROSCOPIC
Bilirubin Urine: NEGATIVE
Glucose, UA: NEGATIVE
Hgb urine dipstick: NEGATIVE
Ketones, ur: NEGATIVE
Leukocytes,Ua: NEGATIVE
Nitrite: NEGATIVE
Protein, ur: NEGATIVE
Specific Gravity, Urine: 1.02 (ref 1.001–1.03)
pH: 6 (ref 5.0–8.0)

## 2019-02-10 LAB — COMPLETE METABOLIC PANEL WITH GFR
AG Ratio: 1.3 (calc) (ref 1.0–2.5)
ALT: 9 U/L (ref 9–46)
AST: 21 U/L (ref 10–35)
Albumin: 4.3 g/dL (ref 3.6–5.1)
Alkaline phosphatase (APISO): 64 U/L (ref 35–144)
BUN/Creatinine Ratio: 17 (calc) (ref 6–22)
BUN: 20 mg/dL (ref 7–25)
CO2: 28 mmol/L (ref 20–32)
Calcium: 9.7 mg/dL (ref 8.6–10.3)
Chloride: 100 mmol/L (ref 98–110)
Creat: 1.21 mg/dL — ABNORMAL HIGH (ref 0.70–1.11)
GFR, Est African American: 64 mL/min/{1.73_m2} (ref >=60)
GFR, Est Non African American: 55 mL/min/{1.73_m2} — ABNORMAL LOW (ref >=60)
Globulin: 3.3 g/dL (calc) (ref 1.9–3.7)
Glucose, Bld: 113 mg/dL — ABNORMAL HIGH (ref 65–99)
Potassium: 4.1 mmol/L (ref 3.5–5.3)
Sodium: 138 mmol/L (ref 135–146)
Total Bilirubin: 0.4 mg/dL (ref 0.2–1.2)
Total Protein: 7.6 g/dL (ref 6.1–8.1)

## 2019-02-10 LAB — LIPID PANEL
Cholesterol: 229 mg/dL — ABNORMAL HIGH (ref <200)
HDL: 61 mg/dL (ref >=40)
LDL Cholesterol (Calc): 140 mg/dL (calc) — ABNORMAL HIGH
Non-HDL Cholesterol (Calc): 168 mg/dL (calc) — ABNORMAL HIGH (ref <130)
Total CHOL/HDL Ratio: 3.8 (calc) (ref <5.0)
Triglycerides: 149 mg/dL (ref <150)

## 2019-02-10 LAB — VITAMIN D 25 HYDROXY (VIT D DEFICIENCY, FRACTURES): Vit D, 25-Hydroxy: 77 ng/mL (ref 30–100)

## 2019-02-10 LAB — PSA: PSA: 2.3 ng/mL (ref <=4.0)

## 2019-02-10 LAB — MAGNESIUM: Magnesium: 1.9 mg/dL (ref 1.5–2.5)

## 2019-02-10 LAB — TSH: TSH: 2.32 mIU/L (ref 0.40–4.50)

## 2019-02-10 LAB — HEMOGLOBIN A1C
Hgb A1c MFr Bld: 6 % of total Hgb — ABNORMAL HIGH (ref <5.7)
Mean Plasma Glucose: 126 (calc)
eAG (mmol/L): 7 (calc)

## 2019-02-10 LAB — URIC ACID: Uric Acid, Serum: 5.3 mg/dL (ref 4.0–8.0)

## 2019-03-10 ENCOUNTER — Other Ambulatory Visit: Payer: Self-pay | Admitting: Internal Medicine

## 2019-03-10 DIAGNOSIS — G4700 Insomnia, unspecified: Secondary | ICD-10-CM

## 2019-03-16 DIAGNOSIS — Z23 Encounter for immunization: Secondary | ICD-10-CM | POA: Diagnosis not present

## 2019-03-29 DIAGNOSIS — G4733 Obstructive sleep apnea (adult) (pediatric): Secondary | ICD-10-CM | POA: Diagnosis not present

## 2019-04-13 DIAGNOSIS — Z23 Encounter for immunization: Secondary | ICD-10-CM | POA: Diagnosis not present

## 2019-04-28 DIAGNOSIS — G4733 Obstructive sleep apnea (adult) (pediatric): Secondary | ICD-10-CM | POA: Diagnosis not present

## 2019-04-30 ENCOUNTER — Other Ambulatory Visit: Payer: Self-pay | Admitting: Internal Medicine

## 2019-04-30 DIAGNOSIS — I10 Essential (primary) hypertension: Secondary | ICD-10-CM

## 2019-05-11 ENCOUNTER — Other Ambulatory Visit: Payer: Self-pay | Admitting: Internal Medicine

## 2019-05-11 ENCOUNTER — Other Ambulatory Visit: Payer: Self-pay | Admitting: Adult Health

## 2019-05-16 ENCOUNTER — Ambulatory Visit: Payer: Medicare Other | Admitting: Adult Health

## 2019-05-16 ENCOUNTER — Encounter: Payer: Self-pay | Admitting: Adult Health Nurse Practitioner

## 2019-05-16 ENCOUNTER — Ambulatory Visit (INDEPENDENT_AMBULATORY_CARE_PROVIDER_SITE_OTHER): Payer: Medicare Other | Admitting: Adult Health Nurse Practitioner

## 2019-05-16 ENCOUNTER — Other Ambulatory Visit: Payer: Self-pay

## 2019-05-16 VITALS — BP 110/62 | HR 55 | Temp 96.4°F | Ht 71.0 in | Wt 184.4 lb

## 2019-05-16 DIAGNOSIS — R7309 Other abnormal glucose: Secondary | ICD-10-CM | POA: Diagnosis not present

## 2019-05-16 DIAGNOSIS — R6889 Other general symptoms and signs: Secondary | ICD-10-CM | POA: Diagnosis not present

## 2019-05-16 DIAGNOSIS — Z860100 Personal history of colon polyps, unspecified: Secondary | ICD-10-CM

## 2019-05-16 DIAGNOSIS — I10 Essential (primary) hypertension: Secondary | ICD-10-CM

## 2019-05-16 DIAGNOSIS — Z79899 Other long term (current) drug therapy: Secondary | ICD-10-CM

## 2019-05-16 DIAGNOSIS — K219 Gastro-esophageal reflux disease without esophagitis: Secondary | ICD-10-CM | POA: Diagnosis not present

## 2019-05-16 DIAGNOSIS — G4700 Insomnia, unspecified: Secondary | ICD-10-CM

## 2019-05-16 DIAGNOSIS — M1000 Idiopathic gout, unspecified site: Secondary | ICD-10-CM

## 2019-05-16 DIAGNOSIS — E559 Vitamin D deficiency, unspecified: Secondary | ICD-10-CM

## 2019-05-16 DIAGNOSIS — Z0000 Encounter for general adult medical examination without abnormal findings: Secondary | ICD-10-CM

## 2019-05-16 DIAGNOSIS — E782 Mixed hyperlipidemia: Secondary | ICD-10-CM | POA: Diagnosis not present

## 2019-05-16 DIAGNOSIS — Z9989 Dependence on other enabling machines and devices: Secondary | ICD-10-CM

## 2019-05-16 DIAGNOSIS — G4733 Obstructive sleep apnea (adult) (pediatric): Secondary | ICD-10-CM

## 2019-05-16 DIAGNOSIS — Z0001 Encounter for general adult medical examination with abnormal findings: Secondary | ICD-10-CM | POA: Diagnosis not present

## 2019-05-16 DIAGNOSIS — K5900 Constipation, unspecified: Secondary | ICD-10-CM

## 2019-05-16 MED ORDER — TRAZODONE HCL 150 MG PO TABS: ORAL_TABLET | ORAL | 1 refills | Status: DC

## 2019-05-16 NOTE — Progress Notes (Signed)
MEDICARE ANNUAL WELLNESS VISIT AND 3 MONTH FOLLOW UP Assessment:   Jeremy Boone was seen today for follow-up and medicare wellness.  Diagnoses and all orders for this visit:  Encounter for Medicare annual wellness exam Yearly  Insomnia, unspecified type Discussed changing timing of HCTZ, DO NOT TAKE BEFORE BED! -     traZODone (DESYREL) 150 MG tablet; Take 1/2-1 tab daily at bedtime for sleep. D/C Klonopin Discussed medication and side effects with patient Discussed good sleep hygiene, decrease stimulation prior to sleep Increase day time activity Avoid caffeine in evenings   Essential hypertension Continue current medications: Atenolol100mg  daily, HCTZ 25mg  daily in morning. Monitor blood pressure at home; call if consistently over 130/80 Continue DASH diet.   Reminder to go to the ER if any CP, SOB, nausea, dizziness, severe HA, changes vision/speech, left arm numbness and tingling and jaw pain. -     CBC with Differential/Platelet -     COMPLETE METABOLIC PANEL WITH GFR  Hyperlipidemia, mixed Continue medications:Lopid 600mg  daily with meals Discussed dietary and exercise modifications Low fat diet -     Lipid panel  Addendum 05/17/19-  RX   ezetimibe (ZETIA) 10 MG tablet; Take 1 tablet (10 mg total) by mouth daily.  Gastroesophageal reflux disease without esophagitis Doing well at this time, no medications Diet discussed Monitor for triggers Avoid food with high acid content Avoid excessive cafeine Increase water intake  Idiopathic gout, unspecified chronicity, unspecified site Continue allopurinol 300mg  daily No recent flares Discussed dietary modifications Continue to monitor Check uric acid as needed, defer today  Abnormal glucose Discussed dietary and exercise modifications  OSA on CPAP Not using, discussed this Discussed hygiene of mask and tubing.  Vitamin D deficiency Continue supplementation Taking Vitamin D 2,000 IU,  BID  Constipation,  unspecified constipation type Doing well at this time Continue current regiment  History of colonic polyps Declines follow up Discussed with patient  Medication management Continued      Future Appointments  Date Time Provider Department Center  08/21/2019  3:30 PM 05/19/19, MD [facility] None  03/12/2020  3:00 PM 08/23/2019, MD [facility] None     Plan:   During the course of the visit the patient was educated and counseled about appropriate screening and preventive services including:    Pneumococcal vaccine   Influenza vaccine  Td vaccine  Screening electrocardiogram  Colorectal cancer screening  Diabetes screening  Glaucoma screening  Nutrition counseling    Subjective:  Jeremy Boone is a AA 85 y.o. male who presents for Medicare Annual Wellness Visit and 3 month follow up for HTN,HLD, prediabetes, and vitamin D Def.   Patient reports a fall last week, hit his head and knee.  He was helping to move a bed in his home.  Reports his head hit the wall and his knee to the ground.  Denies any injury from this except for soreness for a few days.  He has right hip pain when laying in bed and having a hard time sleeping, he wakes 3-4 times a night to use the bathroom.  He has taken some acetaminophen 1,000mg  which has helped some.  Reports that he takes his HCTZ at bedtime.  His blood pressure has been controlled at home, today their BP is BP: 110/62 He does workout. He denies chest pain, , dizziness.  He is on cholesterol medication, lopid 600 once daily due to intolerance of statins due to elevated CPK/aldolase and denies myalgias. His cholesterol is not at goal.  The cholesterol last visit was:   Lab Results  Component Value Date   CHOL 229 (H) 02/09/2019   HDL 61 02/09/2019   LDLCALC 140 (H) 02/09/2019   TRIG 149 02/09/2019   CHOLHDL 3.8 02/09/2019  He has been working on diet and exercise for prediabetes, and denies paresthesia of the feet,  polydipsia, polyuria and visual disturbances. Last A1C in the office was:  Lab Results  Component Value Date   HGBA1C 6.0 (H) 02/09/2019  Patient is on Vitamin D supplement.   Lab Results  Component Value Date   VD25OH 77 02/09/2019    He has ED and uses viagra PRN.  Reports he has not used lately.  Patient is on allopurinol for gout and does not report a recent flare.   He is on klonopin as needed for insomnia, not using CPAP, states he can not wear it.  He has had normal ABI and does not complain of claudication.   BMI is Body mass index is 25.72 kg/m., he is working on diet and exercise. Wt Readings from Last 3 Encounters:  05/16/19 184 lb 6.4 oz (83.6 kg)  02/09/19 186 lb 3.2 oz (84.5 kg)  11/10/18 185 lb (83.9 kg)     Names of Other Physician/Practitioners you currently use: 1. [facility] here for primary care Patient Care Team: [name], MD as PCP - General (Internal Medicine) Lamar Benes, MD as Consulting Physician (Ophthalmology)-  August and July had cataracts removed Drucilla Schmidt, MD as Consulting Physician (Orthopedic Surgery) Louis Meckel, MD as Consulting Physician (Gastroenterology)  Medication Review: Current Outpatient Medications on File Prior to Visit  Medication Sig Dispense Refill  . allopurinol (ZYLOPRIM) 300 MG tablet Take 1 tablet Daily to Prevent Gout 90 tablet 3  . atenolol (TENORMIN) 100 MG tablet Take 1 tablet Daily for BP 90 tablet 3  . Cholecalciferol (VITAMIN D PO) Take 2,000 Int'l Units by mouth 2 (two) times daily.     . clonazePAM (KLONOPIN) 2 MG tablet Take 1/2-1 tablet at Bedtime  ONLY if needed for Sleep &  limit to 5 days /week to avoid addiction 30 tablet 0  . gemfibrozil (LOPID) 600 MG tablet Take 1 tablet 2 x /day with Meals for Cholesterol 180 tablet 3  . hydrochlorothiazide (HYDRODIURIL) 25 MG tablet Take 1 tablet Daily for BP and Fluid Retention  / Ankle Swelling 90 tablet 3    No current facility-administered medications on file prior to visit.    Current Problems (verified) Patient Active Problem List   Diagnosis Date Noted  . Prediabetes 02/08/2019  . Constipation 12/15/2017  . Palpitations 07/31/2015  . OSA on CPAP 04/19/2015  . Vitamin D deficiency 09/14/2013  . Medication management 09/14/2013  . History of colonic polyps 08/02/2013  . Gastroesophageal reflux disease without esophagitis 08/02/2013  . Hyperlipidemia, mixed   . Essential hypertension   . Idiopathic gout   . Abnormal glucose     Screening Tests Immunization History  Administered Date(s) Administered  . Influenza, High Dose Seasonal PF 12/18/2013, 10/28/2015, 12/10/2016, 01/26/2018, 11/10/2018  . Influenza,inj,Quad PF,6+ Mos 03/07/2013  . Moderna SARS-COVID-2 Vaccination 03/14/2019  . Pneumococcal Conjugate-13 03/28/2014  . Pneumococcal-Unspecified 03/03/2007  . Td 03/02/2004  . Tdap 10/25/2014  . Zoster 08/26/2016    Preventative care: Last colonoscopy: 10/13/07 polyps, diverticulosis DECLINES DUE TO AGE EGD: 2015 Dr. Arlyce Dice CXR: 2007 CT head 2010 CT lumbar 2009  Prior vaccinations: TD or Tdap: 2016 Influenza:  Due  2021 Pneumococcal: 2009 Prevnar 13: 2016 Shingles/Zostavax: 2018  History reviewed: allergies, current medications, past family history, past medical history, past social history, past surgical history and problem list  Allergies Allergies  Allergen Reactions  . Ace Inhibitors Cough  . Lipitor [Atorvastatin]     Elevates CPK and Aldolase    SURGICAL HISTORY He  has a past surgical history that includes Carpal tunnel release (Left, 2007); Rotator cuff repair (Left, 2000); Other surgical history (2007); and Cardiac Event Monitor (May-June 2017). FAMILY HISTORY His family history includes Hypertension in his mother; Seizures in his sister; Stroke in his father. SOCIAL HISTORY He  reports that he has never smoked. He has never used smokeless  tobacco. He reports current alcohol use of about 15.0 standard drinks of alcohol per week. He reports that he does not use drugs.  MEDICARE WELLNESS OBJECTIVES: Physical activity: Current Exercise Habits: Home exercise routine, Exercise limited by: orthopedic condition(s) Cardiac risk factors: Cardiac Risk Factors include: advanced age (>8men, >78 women);diabetes mellitus Depression/mood screen:   Depression screen [facility] 2/9 05/16/2019  Decreased Interest 0  Down, Depressed, Hopeless 0  PHQ - 2 Score 0    ADLs:  In your present state of health, do you have any difficulty performing the following activities: 05/16/2019 02/08/2019  Hearing? N N  Vision? N N  Difficulty concentrating or making decisions? N N  Walking or climbing stairs? N N  Dressing or bathing? N N  Doing errands, shopping? N N  Preparing Food and eating ? N -  Using the Toilet? N -  In the past six months, have you accidently leaked urine? N -  Do you have problems with loss of bowel control? N -  Managing your Medications? N -  Managing your Finances? N -  Some recent data might be hidden     Cognitive Testing  Alert? Yes  Normal Appearance?Yes  Oriented to person? Yes  Place? Yes   Time? Yes  Recall of three objects?  Yes  Can perform simple calculations? Yes  Displays appropriate judgment?Yes  Can read the correct time from a watch face?Yes  EOL planning: Does Patient Have a Medical Advance Directive?: Yes Type of Advance Directive: [place] will Does patient want to make changes to medical advance directive?: No - Patient declined   Objective:   Blood pressure 110/62, pulse (!) 55, temperature (!) 96.4 F (35.8 C), height 5\' 11"  (1.803 m), weight 184 lb 6.4 oz (83.6 kg). Body mass index is 25.72 kg/m.  General appearance: alert, no distress, WD/WN, male HEENT: normocephalic, sclerae anicteric, TMs pearly, nares patent, no discharge or erythema, pharynx normal Oral cavity:  MMM, no lesions Neck: supple, no lymphadenopathy, no thyromegaly, no masses Heart: RRR, normal S1, S2, no murmurs Lungs: CTA bilaterally, no wheezes, rhonchi, or rales Abdomen: +bs, soft, mild diffuse tenderness, non distended, no masses, no hepatomegaly, no splenomegaly Musculoskeletal: nontender, no swelling, no obvious deformity Extremities: no edema, no cyanosis, no clubbing Pulses: 2+ symmetric, upper and lower extremities, normal cap refill Neurological: alert, oriented x 3, CN2-12 intact, strength normal upper extremities and lower extremities, sensation normal throughout except left leg decrease sensation in L5/S1 distribution,, DTRs 2+ throughout, no cerebellar signs, gait normal Psychiatric: normal affect, behavior normal, pleasant   Medicare Attestation I have personally reviewed: The patient's medical and social history Their use of alcohol, tobacco or illicit drugs Their current medications and supplements The patient's functional ability including ADLs,fall risks, home safety risks, cognitive, and  hearing and visual impairment Diet and physical activities Evidence for depression or mood disorders  The patient's weight, height, BMI, and visual acuity have been recorded in the chart.  I have made referrals, counseling, and provided education to the patient based on review of the above and I have provided the patient with a written personalized care plan for preventive services.     Elder Negus, NP   05/16/2019

## 2019-05-16 NOTE — Patient Instructions (Addendum)
Try changing the time of your Hydrochlorothiazide (Hydrodiuril) 25mg  in the morning when you do not have to leave the house.  OR  Take this earlier in the evening once you are home.   We can try trazodone to help you sleep.  DO NOT TAKE trazodone and Hydrochlorothiazide at the same time.    Jeremy Boone , Thank you for taking time to come for your Medicare Wellness Visit. I appreciate your ongoing commitment to your health goals. Please review the following plan we discussed and let me know if I can assist you in the future.   These are the goals we discussed: Goals    . Exercise 150 min/wk Moderate Activity     Aim to walk 30 min, 5+ days     . HEMOGLOBIN A1C < 5.7    . LDL CALC < 130       This is a list of the screening recommended for you and due dates:  Health Maintenance  Topic Date Due  . Urine Protein Check  02/09/2020  . Tetanus Vaccine  10/24/2024  . Flu Shot  Completed  . Pneumonia vaccines  Completed     Vit D  & Vit C 1,000 mg   are recommended to help protect  against the Covid-19 and other Corona viruses.    Also it's recommended  to take  Zinc 50 mg  to help  protect against the Covid-19   and best place to get  is also on [email or web address]  and don't pay more than 6-8 cents /pill !  ================================ Coronavirus (COVID-19) Are you at risk?  Are you at risk for the Coronavirus (COVID-19)?  To be considered HIGH RISK for Coronavirus (COVID-19), you have to meet the following criteria:  . Traveled to [place], [place], [place], [place] or [place]; or in the [place] to [place], [place], [place]  . or [place]; and have fever, cough, and shortness of breath within the last 2 weeks of travel OR . Been in close contact with a person diagnosed with COVID-19 within the last 2 weeks and have  . fever, cough,and shortness of breath .  . IF YOU DO NOT MEET THESE CRITERIA, YOU ARE CONSIDERED LOW RISK FOR COVID-19.  What to do if you are  HIGH RISK for COVID-19?  Marland Kitchen If you are having a medical emergency, call 911. . Seek medical care right away. Before you go to a doctor's office, urgent care or emergency department, .  call ahead and tell them about your recent travel, contact with someone diagnosed with COVID-19  .  and your symptoms.  . You should receive instructions from your physician's office regarding next steps of care.  . When you arrive at healthcare provider, tell the healthcare staff immediately you have returned from  . visiting [place], [place], [place], [place] or [place]; or traveled in the [place] to [place], [place],  . [place] or [place] in the last two weeks or you have been in close contact with a person diagnosed with  . COVID-19 in the last 2 weeks.   . Tell the health care staff about your symptoms: fever, cough and shortness of breath. . After you have been seen by a medical provider, you will be either: o Tested for (COVID-19) and discharged home on quarantine except to seek medical care if  o symptoms worsen, and asked to  - Stay home and avoid contact with others until you get  your results (4-5 days)  - Avoid travel on public transportation if possible (such as bus, train, or airplane) or o Sent to the Emergency Department by EMS for evaluation, COVID-19 testing  and  o possible admission depending on your condition and test results.  What to do if you are LOW RISK for COVID-19?  Reduce your risk of any infection by using the same precautions used for avoiding the common cold or flu:  Marland Kitchen Wash your hands often with soap and warm water for at least 20 seconds.  If soap and water are not readily available,  . use an alcohol-based hand sanitizer with at least 60% alcohol.  . If coughing or sneezing, cover your mouth and nose by coughing or sneezing into the elbow areas of your shirt or coat, .  into a tissue or into your sleeve (not your hands). . Avoid shaking hands with others and  consider head nods or verbal greetings only. . Avoid touching your eyes, nose, or mouth with unwashed hands.  . Avoid close contact with people who are sick. . Avoid places or events with large numbers of people in one location, like concerts or sporting events. . Carefully consider travel plans you have or are making. . If you are planning any travel outside or inside the [place], visit the CDC's Travelers' Health webpage for the latest health notices. . If you have some symptoms but not all symptoms, continue to monitor at home and seek medical attention  . if your symptoms worsen. . If you are having a medical emergency, call 911. >>>>>>>>>>>>>>>>>>>>>>>>>>>>>>>>>>>>>>>>>>>>>>>>>>>>>>> We Do NOT Approve of  [facility] [place] Soliciting Our Patients  To Do Home Visits  & We Do NOT Approve of [employer] > > > > > > > > > > > > > > > > > > > > > > > > > > > > > > > > > > >  > > > >   Preventive Care for Adults  A healthy lifestyle and preventive care can promote health and wellness. Preventive health guidelines for men include the following key practices:  A routine yearly physical is a good way to check with your health care provider about your health and preventative screening. It is a chance to share any concerns and updates on your health and to receive a thorough exam.  Visit your dentist for a routine exam and preventative care every 6 months. Brush your teeth twice a day and floss once a day. Good oral hygiene prevents tooth decay and gum disease.  The frequency of eye exams is based on your age, health, family medical history, use of contact lenses, and other factors. Follow your health care provider's recommendations for frequency of eye exams.  Eat a healthy diet. Foods such as vegetables, fruits, whole grains, low-fat dairy products, and lean protein foods contain the nutrients you need without too many calories. Decrease your intake of foods high in solid  fats, added sugars, and salt. Eat the right amount of calories for you. Get information about a proper diet from your health care provider, if necessary.  Regular physical exercise is one of the most important things you can do for your health. Most adults should get at least 150 minutes of moderate-intensity exercise (any activity that increases your heart rate and causes you to sweat) each week. In addition, most adults need muscle-strengthening exercises on 2 or more days a week.  Maintain a healthy weight. The body  mass index (BMI) is a screening tool to identify possible weight problems. It provides an estimate of body fat based on height and weight. Your health care provider can find your BMI and can help you achieve or maintain a healthy weight. For adults 20 years and older:  A BMI below 18.5 is considered underweight.  A BMI of 18.5 to 24.9 is normal.  A BMI of 25 to 29.9 is considered overweight.  A BMI of 30 and above is considered obese.  Maintain normal blood lipids and cholesterol levels by exercising and minimizing your intake of saturated fat. Eat a balanced diet with plenty of fruit and vegetables. Blood tests for lipids and cholesterol should begin at age 65 and be repeated every 5 years. If your lipid or cholesterol levels are high, you are over 50, or you are at high risk for heart disease, you may need your cholesterol levels checked more frequently. Ongoing high lipid and cholesterol levels should be treated with medicines if diet and exercise are not working.  If you smoke, find out from your health care provider how to quit. If you do not use tobacco, do not start.  Lung cancer screening is recommended for adults aged 55-80 years who are at high risk for developing lung cancer because of a history of smoking. A yearly low-dose CT scan of the lungs is recommended for people who have at least a 30-pack-year history of smoking and are a current smoker or have quit within the  past 15 years. A pack year of smoking is smoking an average of 1 pack of cigarettes a day for 1 year (for example: 1 pack a day for 30 years or 2 packs a day for 15 years). Yearly screening should continue until the smoker has stopped smoking for at least 15 years. Yearly screening should be stopped for people who develop a health problem that would prevent them from having lung cancer treatment.  If you choose to drink alcohol, do not have more than 2 drinks per day. One drink is considered to be 12 ounces (355 mL) of beer, 5 ounces (148 mL) of wine, or 1.5 ounces (44 mL) of liquor.  Avoid use of street drugs. Do not share needles with anyone. Ask for help if you need support or instructions about stopping the use of drugs.  High blood pressure causes heart disease and increases the risk of stroke. Your blood pressure should be checked at least every 1-2 years. Ongoing high blood pressure should be treated with medicines, if weight loss and exercise are not effective.  If you are 79-30 years old, ask your health care provider if you should take aspirin to prevent heart disease.  Diabetes screening involves taking a blood sample to check your fasting blood sugar level. Testing should be considered at a younger age or be carried out more frequently if you are overweight and have at least 1 risk factor for diabetes.  Colorectal cancer can be detected and often prevented. Most routine colorectal cancer screening begins at the age of 44 and continues through age 71. However, your health care provider may recommend screening at an earlier age if you have risk factors for colon cancer. On a yearly basis, your health care provider may provide home test kits to check for hidden blood in the stool. Use of a small camera at the end of a tube to directly examine the colon (sigmoidoscopy or colonoscopy) can detect the earliest forms of colorectal cancer. Talk to  your health care provider about this at age 83, when  routine screening begins. Direct exam of the colon should be repeated every 5-10 years through age 83, unless early forms of precancerous polyps or small growths are found.  Hepatitis C blood testing is recommended for all people born from [id number] through 1965 and any individual with known risks for hepatitis C.  Screening for abdominal aortic aneurysm (AAA)  by ultrasound is recommended for people who have history of high blood pressure or who are current or former smokers.  Healthy men should  receive prostate-specific antigen (PSA) blood tests as part of routine cancer screening. Talk with your health care provider about prostate cancer screening.  Testicular cancer screening is  recommended for adult males. Screening includes self-exam, a health care provider exam, and other screening tests. Consult with your health care provider about any symptoms you have or any concerns you have about testicular cancer.  Use sunscreen. Apply sunscreen liberally and repeatedly throughout the day. You should seek shade when your shadow is shorter than you. Protect yourself by wearing long sleeves, pants, a wide-brimmed hat, and sunglasses year round, whenever you are outdoors.  Once a month, do a whole-body skin exam, using a mirror to look at the skin on your back. Tell your health care provider about new moles, moles that have irregular borders, moles that are larger than a pencil eraser, or moles that have changed in shape or color.  Stay current with required vaccines (immunizations).  Influenza vaccine. All adults should be immunized every year.  Tetanus, diphtheria, and acellular pertussis (Td, Tdap) vaccine. An adult who has not previously received Tdap or who does not know his vaccine status should receive 1 dose of Tdap. This initial dose should be followed by tetanus and diphtheria toxoids (Td) booster doses every 10 years. Adults with an unknown or incomplete history of completing a 3-dose immunization  series with Td-containing vaccines should begin or complete a primary immunization series including a Tdap dose. Adults should receive a Td booster every 10 years.  Zoster vaccine. One dose is recommended for adults aged 83 years or older unless certain conditions are present.    PREVNAR - Pneumococcal 13-valent conjugate (PCV13) vaccine. When indicated, a person who is uncertain of his immunization history and has no record of immunization should receive the PCV13 vaccine. An adult aged 83 years or older who has certain medical conditions and has not been previously immunized should receive 1 dose of PCV13 vaccine. This PCV13 should be followed with a dose of pneumococcal polysaccharide (PPSV23) vaccine. The PPSV23 vaccine dose should be obtained 1 or more year(s)after the dose of PCV13 vaccine. An adult aged 83 years or older who has certain medical conditions and previously received 1 or more doses of PPSV23 vaccine should receive 1 dose of PCV13. The PCV13 vaccine dose should be obtained 1 or more years after the last PPSV23 vaccine dose.    PNEUMOVAX - Pneumococcal polysaccharide (PPSV23) vaccine. When PCV13 is also indicated, PCV13 should be obtained first. All adults aged 83 years and older should be immunized. An adult younger than age 83 years who has certain medical conditions should be immunized. Any person who resides in a nursing home or long-term care facility should be immunized. An adult smoker should be immunized. People with an immunocompromised condition and certain other conditions should receive both PCV13 and PPSV23 vaccines. People with human immunodeficiency virus (HIV) infection should be immunized as soon as possible after diagnosis. Immunization  during chemotherapy or radiation therapy should be avoided. Routine use of PPSV23 vaccine is not recommended for American Indians, [address], or people younger than 65 years unless there are medical conditions that require PPSV23  vaccine. When indicated, people who have unknown immunization and have no record of immunization should receive PPSV23 vaccine. One-time revaccination 5 years after the first dose of PPSV23 is recommended for people aged 19-64 years who have chronic kidney failure, nephrotic syndrome, asplenia, or immunocompromised conditions. People who received 1-2 doses of PPSV23 before age 27 years should receive another dose of PPSV23 vaccine at age 66 years or later if at least 5 years have passed since the previous dose. Doses of PPSV23 are not needed for people immunized with PPSV23 at or after age 45 years.    Hepatitis A vaccine. Adults who wish to be protected from this disease, have certain high-risk conditions, work with hepatitis A-infected animals, work in hepatitis A research labs, or travel to or work in countries with a high rate of hepatitis A should be immunized. Adults who were previously unvaccinated and who anticipate close contact with an international adoptee during the first 60 days after arrival in the [place] from a country with a high rate of hepatitis A should be immunized.    Hepatitis B vaccine. Adults should be immunized if they wish to be protected from this disease, have certain high-risk conditions, may be exposed to blood or other infectious body fluids, are household contacts or sex partners of hepatitis B positive people, are clients or workers in certain care facilities, or travel to or work in countries with a high rate of hepatitis B.   Preventive Service / Frequency   Ages 54 and over  Blood pressure check.  Lipid and cholesterol check.  Lung cancer screening. / Every year if you are aged 55-80 years and have a 30-pack-year history of smoking and currently smoke or have quit within the past 15 years. Yearly screening is stopped once you have quit smoking for at least 15 years or develop a health problem that would prevent you from having lung cancer  treatment.  Fecal occult blood test (FOBT) of stool. You may not have to do this test if you get a colonoscopy every 10 years.  Flexible sigmoidoscopy** or colonoscopy.** / Every 5 years for a flexible sigmoidoscopy or every 10 years for a colonoscopy beginning at age 104 and continuing until age 59.  Hepatitis C blood test.** / For all people born from [birth year] through 1963 and any individual with known risks for hepatitis C.  Abdominal aortic aneurysm (AAA) screening./ Screening current or former smokers or have Hypertension.  Skin self-exam. / Monthly.  Influenza vaccine. / Every year.  Tetanus, diphtheria, and acellular pertussis (Tdap/Td) vaccine.** / 1 dose of Td every 10 years.   Zoster vaccine.** / 1 dose for adults aged 27 years or older.         Pneumococcal 13-valent conjugate (PCV13) vaccine.    Pneumococcal polysaccharide (PPSV23) vaccine.     Hepatitis A vaccine.** / Consult your health care provider.  Hepatitis B vaccine.** / Consult your health care provider. Screening for abdominal aortic aneurysm (AAA)  by ultrasound is recommended for people who have history of high blood pressure or who are current or former smokers. ++++++++++ Recommend Adult Low Dose Aspirin or  coated  Aspirin 81 mg daily  To reduce risk of Colon Cancer 40 %,  Skin Cancer 26 % ,  Malignant Melanoma 46%  and  Pancreatic cancer 60% ++++++++++++++++++++++ Vitamin D goal  is between 70-100.  Please make sure that you are taking your Vitamin D as directed.  It is very important as a natural anti-inflammatory  helping hair, skin, and nails, as well as reducing stroke and heart attack risk.  It helps your bones and helps with mood. It also decreases numerous cancer risks so please take it as directed.  Low Vit D is associated with a 200-300% higher risk for CANCER  and 200-300% higher risk for HEART   ATTACK  &  STROKE.   .....................................Marland Kitchen It is also associated with  higher death rate at younger ages,  autoimmune diseases like Rheumatoid arthritis, Lupus, Multiple Sclerosis.    Also many other serious conditions, like depression, Alzheimer's Dementia, infertility, muscle aches, fatigue, fibromyalgia - just to name a few. ++++++++++++++++++++++ Recommend the book "The END of DIETING" by Dr Monico Hoar  & the book "The END of DIABETES " by Dr Monico Hoar At [email or web address] - get book & Audio CD's    Being diabetic has a  300% increased risk for heart attack, stroke, cancer, and alzheimer- type vascular dementia. It is very important that you work harder with diet by avoiding all foods that are white. Avoid white rice (brown & wild rice is OK), white potatoes (sweetpotatoes in moderation is OK), White bread or wheat bread or anything made out of white flour like bagels, donuts, rolls, buns, biscuits, cakes, pastries, cookies, pizza crust, and pasta (made from white flour & egg whites) - vegetarian pasta or spinach or wheat pasta is OK. Multigrain breads like Arnold's or [employer], or multigrain sandwich thins or flatbreads.  Diet, exercise and weight loss can reverse and cure diabetes in the early stages.  Diet, exercise and weight loss is very important in the control and prevention of complications of diabetes which affects every system in your body, ie. Brain - dementia/stroke, eyes - glaucoma/blindness, heart - heart attack/heart failure, kidneys - dialysis, stomach - gastric paralysis, intestines - malabsorption, nerves - severe painful neuritis, circulation - gangrene & loss of a leg(s), and finally cancer and Alzheimers.    I recommend avoid fried & greasy foods,  sweets/candy, white rice (brown or wild rice or Quinoa is OK), white potatoes (sweet potatoes are OK) - anything made from white flour - bagels, doughnuts, rolls, buns, biscuits,white and wheat breads, pizza crust and traditional pasta made of white flour & egg white(vegetarian pasta or spinach or  wheat pasta is OK).  Multi-grain bread is OK - like multi-grain flat bread or sandwich thins. Avoid alcohol in excess. Exercise is also important.    Eat all the vegetables you want - avoid meat, especially red meat and dairy - especially cheese.  Cheese is the most concentrated form of trans-fats which is the worst thing to clog up our arteries. Veggie cheese is OK which can be found in the fresh produce section at [employer] or [employer] or [employer]  ++++++++++++++++++++++ DASH Eating Plan  DASH stands for "Dietary Approaches to Stop Hypertension."   The DASH eating plan is a healthy eating plan that has been shown to reduce high blood pressure (hypertension). Additional health benefits may include reducing the risk of type 2 diabetes mellitus, heart disease, and stroke. The DASH eating plan may also help with weight loss. WHAT DO I NEED TO KNOW ABOUT THE DASH EATING PLAN? For the DASH eating plan, you will follow these general guidelines:  Choose foods with a  percent daily value for sodium of less than 5% (as listed on the food label).  Use salt-free seasonings or herbs instead of table salt or sea salt.  Check with your health care provider or pharmacist before using salt substitutes.  Eat lower-sodium products, often labeled as "lower sodium" or "no salt added."  Eat fresh foods.  Eat more vegetables, fruits, and low-fat dairy products.  Choose whole grains. Look for the word "whole" as the first word in the ingredient list.  Choose fish   Limit sweets, desserts, sugars, and sugary drinks.  Choose heart-healthy fats.  Eat veggie cheese   Eat more home-cooked food and less restaurant, buffet, and fast food.  Limit fried foods.  Cook foods using methods other than frying.  Limit canned vegetables. If you do use them, rinse them well to decrease the sodium.  When eating at a restaurant, ask that your food be prepared with less salt, or no salt if possible.                       WHAT FOODS CAN I EAT? Read Dr Francis Dowse Fuhrman's books on The End of Dieting & The End of Diabetes  Grains Whole grain or whole wheat bread. Brown rice. Whole grain or whole wheat pasta. Quinoa, bulgur, and whole grain cereals. Low-sodium cereals. Corn or whole wheat flour tortillas. Whole grain cornbread. Whole grain crackers. Low-sodium crackers.  Vegetables Fresh or frozen vegetables (raw, steamed, roasted, or grilled). Low-sodium or reduced-sodium tomato and vegetable juices. Low-sodium or reduced-sodium tomato sauce and paste. Low-sodium or reduced-sodium canned vegetables.   Fruits All fresh, canned (in natural juice), or frozen fruits.  Protein Products  All fish and seafood.  Dried beans, peas, or lentils. Unsalted nuts and seeds. Unsalted canned beans.  Dairy Low-fat dairy products, such as skim or 1% milk, 2% or reduced-fat cheeses, low-fat ricotta or cottage cheese, or plain low-fat yogurt. Low-sodium or reduced-sodium cheeses.  Fats and Oils Tub margarines without trans fats. Light or reduced-fat mayonnaise and salad dressings (reduced sodium). Avocado. Safflower, olive, or canola oils. Natural peanut or almond butter.  Other Unsalted popcorn and pretzels. The items listed above may not be a complete list of recommended foods or beverages. Contact your dietitian for more options.  ++++++++++++++++++++  WHAT FOODS ARE NOT RECOMMENDED? Grains/ White flour or wheat flour White bread. White pasta. White rice. Refined cornbread. Bagels and croissants. Crackers that contain trans fat.  Vegetables  Creamed or fried vegetables. Vegetables in a . Regular canned vegetables. Regular canned tomato sauce and paste. Regular tomato and vegetable juices.  Fruits Dried fruits. Canned fruit in light or heavy syrup. Fruit juice.  Meat and Other Protein Products Meat in general - RED meat & White meat.  Fatty cuts of meat. Ribs, chicken wings, all processed meats as bacon,  sausage, bologna, salami, fatback, hot dogs, bratwurst and packaged luncheon meats.  Dairy Whole or 2% milk, cream, half-and-half, and cream cheese. Whole-fat or sweetened yogurt. Full-fat cheeses or blue cheese. Non-dairy creamers and whipped toppings. Processed cheese, cheese spreads, or cheese curds.  Condiments Onion and garlic salt, seasoned salt, table salt, and sea salt. Canned and packaged gravies. Worcestershire sauce. Tartar sauce. Barbecue sauce. Teriyaki sauce. Soy sauce, including reduced sodium. Steak sauce. Fish sauce. Oyster sauce. Cocktail sauce. Horseradish. Ketchup and mustard. Meat flavorings and tenderizers. Bouillon cubes. Hot sauce. Tabasco sauce. Marinades. Taco seasonings. Relishes.  Fats and Oils Butter, stick margarine, lard, shortening and bacon fat.  Coconut, palm kernel, or palm oils. Regular salad dressings.  Pickles and olives. Salted popcorn and pretzels.  The items listed above may not be a complete list of foods and beverages to avoid.

## 2019-05-17 DIAGNOSIS — G4733 Obstructive sleep apnea (adult) (pediatric): Secondary | ICD-10-CM | POA: Diagnosis not present

## 2019-05-17 LAB — CBC WITH DIFFERENTIAL/PLATELET
Absolute Monocytes: 416 cells/uL (ref 200–950)
Basophils Absolute: 68 cells/uL (ref 0–200)
Basophils Relative: 1.3 %
Eosinophils Absolute: 478 cells/uL (ref 15–500)
Eosinophils Relative: 9.2 %
HCT: 44.5 % (ref 38.5–50.0)
Hemoglobin: 14.9 g/dL (ref 13.2–17.1)
Lymphs Abs: 2740 cells/uL (ref 850–3900)
MCH: 30.8 pg (ref 27.0–33.0)
MCHC: 33.5 g/dL (ref 32.0–36.0)
MCV: 91.9 fL (ref 80.0–100.0)
MPV: 10.3 fL (ref 7.5–12.5)
Monocytes Relative: 8 %
Neutro Abs: 1498 cells/uL — ABNORMAL LOW (ref 1500–7800)
Neutrophils Relative %: 28.8 %
Platelets: 238 10*3/uL (ref 140–400)
RBC: 4.84 10*6/uL (ref 4.20–5.80)
RDW: 13 % (ref 11.0–15.0)
Total Lymphocyte: 52.7 %
WBC: 5.2 10*3/uL (ref 3.8–10.8)

## 2019-05-17 LAB — COMPLETE METABOLIC PANEL WITH GFR
AG Ratio: 1.3 (calc) (ref 1.0–2.5)
ALT: 9 U/L (ref 9–46)
AST: 21 U/L (ref 10–35)
Albumin: 4.3 g/dL (ref 3.6–5.1)
Alkaline phosphatase (APISO): 60 U/L (ref 35–144)
BUN/Creatinine Ratio: 16 (calc) (ref 6–22)
BUN: 19 mg/dL (ref 7–25)
CO2: 29 mmol/L (ref 20–32)
Calcium: 9.7 mg/dL (ref 8.6–10.3)
Chloride: 97 mmol/L — ABNORMAL LOW (ref 98–110)
Creat: 1.17 mg/dL — ABNORMAL HIGH (ref 0.70–1.11)
GFR, Est African American: 66 mL/min/{1.73_m2} (ref >=60)
GFR, Est Non African American: 57 mL/min/{1.73_m2} — ABNORMAL LOW (ref >=60)
Globulin: 3.4 g/dL (calc) (ref 1.9–3.7)
Glucose, Bld: 123 mg/dL — ABNORMAL HIGH (ref 65–99)
Potassium: 4.1 mmol/L (ref 3.5–5.3)
Sodium: 135 mmol/L (ref 135–146)
Total Bilirubin: 0.5 mg/dL (ref 0.2–1.2)
Total Protein: 7.7 g/dL (ref 6.1–8.1)

## 2019-05-17 LAB — LIPID PANEL
Cholesterol: 244 mg/dL — ABNORMAL HIGH (ref <200)
HDL: 67 mg/dL (ref >=40)
LDL Cholesterol (Calc): 156 mg/dL (calc) — ABNORMAL HIGH
Non-HDL Cholesterol (Calc): 177 mg/dL (calc) — ABNORMAL HIGH (ref <130)
Total CHOL/HDL Ratio: 3.6 (calc) (ref <5.0)
Triglycerides: 97 mg/dL (ref <150)

## 2019-05-17 MED ORDER — EZETIMIBE 10 MG PO TABS: 10.0000 mg | ORAL_TABLET | Freq: Every day | ORAL | 3 refills | Status: DC

## 2019-05-30 ENCOUNTER — Other Ambulatory Visit: Payer: Self-pay

## 2019-05-30 ENCOUNTER — Ambulatory Visit (INDEPENDENT_AMBULATORY_CARE_PROVIDER_SITE_OTHER): Payer: Medicare Other | Admitting: Physician Assistant

## 2019-05-30 ENCOUNTER — Encounter: Payer: Self-pay | Admitting: Physician Assistant

## 2019-05-30 VITALS — BP 118/68 | HR 72 | Temp 98.1°F | Wt 182.0 lb

## 2019-05-30 DIAGNOSIS — R300 Dysuria: Secondary | ICD-10-CM

## 2019-05-30 MED ORDER — SULFAMETHOXAZOLE-TRIMETHOPRIM 800-160 MG PO TABS: 1.0000 | ORAL_TABLET | Freq: Two times a day (BID) | ORAL | 0 refills | Status: AC

## 2019-05-30 MED ORDER — FLUCONAZOLE 150 MG PO TABS: 150.0000 mg | ORAL_TABLET | Freq: Every day | ORAL | 3 refills | Status: DC

## 2019-05-30 MED ORDER — TAMSULOSIN HCL 0.4 MG PO CAPS: 0.4000 mg | ORAL_CAPSULE | Freq: Every day | ORAL | 2 refills | Status: DC

## 2019-05-30 NOTE — Patient Instructions (Addendum)
Start on the bactrim twice a day for 14 days This will treat a UTI or prostate infection  Start on the flomax/tamsulosin at night before bed. This medication can cause hypotension or low blood pressure so be careful with standing in the beginning and decrease the dose of your atenolol to 1/2 pill or stop the tamsulosin if you get dizzy. Please drink plenty of fluids.   Take the diflucan the send week of taking the bactrim- do not drink 2 days around this medication.   IF this is not better OR if you get new symptoms let us know, may need labs or imaging.   If any fever, chills, blood in urine, unable to urinate, severe AB pain or worsening symptoms, patient will go to the ER.  `   Urinary Tract Infection, Adult  A urinary tract infection (UTI) is an infection of any part of the urinary tract. The urinary tract includes the kidneys, ureters, bladder, and urethra. These organs make, store, and get rid of urine in the body. Your health care provider may use other names to describe the infection. An upper UTI affects the ureters and kidneys (pyelonephritis). A lower UTI affects the bladder (cystitis) and urethra (urethritis). What are the causes? Most urinary tract infections are caused by bacteria in your genital area, around the entrance to your urinary tract (urethra). These bacteria grow and cause inflammation of your urinary tract. What increases the risk? You are more likely to develop this condition if:  You have a urinary catheter that stays in place (indwelling).  You are not able to control when you urinate or have a bowel movement (you have incontinence).  You are male and you: ? Use a spermicide or diaphragm for birth control. ? Have low estrogen levels. ? Are pregnant.  You have certain genes that increase your risk (genetics).  You are sexually active.  You take antibiotic medicines.  You have a condition that causes your flow of urine to slow down, such as: ? An  enlarged prostate, if you are male. ? Blockage in your urethra (stricture). ? A kidney stone. ? A nerve condition that affects your bladder control (neurogenic bladder). ? Not getting enough to drink, or not urinating often.  You have certain medical conditions, such as: ? Diabetes. ? A weak disease-fighting system (immunesystem). ? Sickle cell disease. ? Gout. ? Spinal cord injury. What are the signs or symptoms? Symptoms of this condition include:  Needing to urinate right away (urgently).  Frequent urination or passing small amounts of urine frequently.  Pain or burning with urination.  Blood in the urine.  Urine that smells bad or unusual.  Trouble urinating.  Cloudy urine.  Vaginal discharge, if you are male.  Pain in the abdomen or the lower back. You may also have:  Vomiting or a decreased appetite.  Confusion.  Irritability or tiredness.  A fever.  Diarrhea. The first symptom in older adults may be confusion. In some cases, they may not have any symptoms until the infection has worsened. How is this diagnosed? This condition is diagnosed based on your medical history and a physical exam. You may also have other tests, including:  Urine tests.  Blood tests.  Tests for sexually transmitted infections (STIs). If you have had more than one UTI, a cystoscopy or imaging studies may be done to determine the cause of the infections. How is this treated? Treatment for this condition includes:  Antibiotic medicine.  Over-the-counter medicines to treat discomfort.  Drinking enough water to stay hydrated. If you have frequent infections or have other conditions such as a kidney stone, you may need to see a health care provider who specializes in the urinary tract (urologist). In rare cases, urinary tract infections can cause sepsis. Sepsis is a life-threatening condition that occurs when the body responds to an infection. Sepsis is treated in the hospital  with IV antibiotics, fluids, and other medicines. Follow these instructions at home:  Medicines  Take over-the-counter and prescription medicines only as told by your health care provider.  If you were prescribed an antibiotic medicine, take it as told by your health care provider. Do not stop using the antibiotic even if you start to feel better. General instructions  Make sure you: ? Empty your bladder often and completely. Do not hold urine for long periods of time. ? Empty your bladder after sex. ? Wipe from front to back after a bowel movement if you are male. Use each tissue one time when you wipe.  Drink enough fluid to keep your urine pale yellow.  Keep all follow-up visits as told by your health care provider. This is important. Contact a health care provider if:  Your symptoms do not get better after 1-2 days.  Your symptoms go away and then return. Get help right away if you have:  Severe pain in your back or your lower abdomen.  A fever.  Nausea or vomiting. Summary  A urinary tract infection (UTI) is an infection of any part of the urinary tract, which includes the kidneys, ureters, bladder, and urethra.  Most urinary tract infections are caused by bacteria in your genital area, around the entrance to your urinary tract (urethra).  Treatment for this condition often includes antibiotic medicines.  If you were prescribed an antibiotic medicine, take it as told by your health care provider. Do not stop using the antibiotic even if you start to feel better.  Keep all follow-up visits as told by your health care provider. This is important. This information is not intended to replace advice given to you by your health care provider. Make sure you discuss any questions you have with your health care provider. Document Revised: 02/03/2018 Document Reviewed: 08/26/2017 Elsevier Patient Education  2020 [employer]   Prostatitis  Prostatitis is swelling or  inflammation of the prostate gland. The prostate is a walnut-sized gland that is involved in the production of semen. It is located below a man's bladder, in front of the rectum. There are four types of prostatitis:  Chronic nonbacterial prostatitis. This is the most common type of prostatitis. It may be associated with a viral infection or autoimmune disorder.  Acute bacterial prostatitis. This is the least common type of prostatitis. It starts quickly and is usually associated with a bladder infection, high fever, and shaking chills. It can occur at any age.  Chronic bacterial prostatitis. This type usually results from acute bacterial prostatitis that happens repeatedly (is recurrent) or has not been treated properly. It can occur in men of any age but is most common among middle-aged men whose prostate has begun to get larger. The symptoms are not as severe as symptoms caused by acute bacterial prostatitis.  Prostatodynia or chronic pelvic pain syndrome (CPPS). This type is also called pelvic floor disorder. It is associated with increased muscular tone in the pelvis surrounding the prostate. What are the causes? Bacterial prostatitis is caused by infection from bacteria. Chronic nonbacterial prostatitis may be caused by:  Urinary  tract infections (UTIs).  Nerve damage.  A response by the body's disease-fighting system (autoimmune response).  Chemicals in the urine. The causes of the other types of prostatitis are usually not known. What are the signs or symptoms? Symptoms of this condition vary depending upon the type of prostatitis. If you have acute bacterial prostatitis, you may experience:  Urinary symptoms, such as: ? Painful urination. ? Burning during urination. ? Frequent and sudden urges to urinate. ? Inability to start urinating. ? A weak or interrupted stream of urine.  Vomiting.  Nausea.  Fever.  Chills.  Inability to empty the bladder completely.  Pain in  the: ? Muscles or joints. ? Lower back. ? Lower abdomen. If you have any of the other types of prostatitis, you may experience:  Urinary symptoms, such as: ? Sudden urges to urinate. ? Frequent urination. ? Difficulty starting urination. ? Weak urine stream. ? Dribbling after urination.  Discharge from the urethra. The urethra is a tube that opens at the end of the penis.  Pain in the: ? Testicles. ? Penis or tip of the penis. ? Rectum. ? Area in front of the rectum and below the scrotum (perineum).  Problems with sexual function.  Painful ejaculation.  Bloody semen. How is this diagnosed? This condition may be diagnosed based on:  A physical and medical exam.  Your symptoms.  A urine test to check for bacteria.  An exam in which a health care provider uses a finger to feel the prostate (digital rectal exam).  A test of a sample of semen.  Blood tests.  Ultrasound.  Removal of prostate tissue to be examined under a microscope (biopsy).  Tests to check how your body handles urine (urodynamic tests).  A test to look inside your bladder or urethra (cystoscopy). How is this treated? Treatment for this condition depends on the type of prostatitis. Treatment may involve:  Medicines to relieve pain or inflammation.  Medicines to help relax your muscles.  Physical therapy.  Heat therapy.  Techniques to help you control certain body functions (biofeedback).  Relaxation exercises.  Antibiotic medicine, if your condition is caused by bacteria.  Warm water baths (sitz baths). Sitz baths help with relaxing your pelvic floor muscles, which helps to relieve pressure on the prostate. Follow these instructions at home:   Take over-the-counter and prescription medicines only as told by your health care provider.  If you were prescribed an antibiotic, take it as told by your health care provider. Do not stop taking the antibiotic even if you start to feel better.   If physical therapy, biofeedback, or relaxation exercises were prescribed, do exercises as instructed.  Take sitz baths as directed by your health care provider. For a sitz bath, sit in warm water that is deep enough to cover your hips and buttocks.  Keep all follow-up visits as told by your health care provider. This is important. Contact a health care provider if:  Your symptoms get worse.  You have a fever. Get help right away if:  You have chills.  You feel nauseous.  You vomit.  You feel light-headed or feel like you are going to faint.  You are unable to urinate.  You have blood or blood clots in your urine. This information is not intended to replace advice given to you by your health care provider. Make sure you discuss any questions you have with your health care provider. Document Revised: 05/01/2017 Document Reviewed: 11/07/2015 Elsevier Patient Education  [phone]  [employer].

## 2019-05-30 NOTE — Progress Notes (Signed)
Subjective:    Patient ID: Jeremy Boone, male    DOB: 12-Jul-1934, 85 y.o.   MRN: [id number]  HPI 85 y.o. AAM with history of gout, preDM, GERD, OSA on CPAP, HTN presents with urinary frequency, burning, urgency and some new incontinence x Friday/Saturday morning. He had slight HA last night.  He denies chills, fever, flank pain, hematuria, lower abdominal pain. He has felt pain in his buttocks with sitting down, noticed that today.  No sexual partners.  PSA has been normal.  Lab Results  Component Value Date   PSA 2.3 02/09/2019   PSA 2.2 01/26/2018   PSA 3.6 12/10/2016    Lab Results  Component Value Date   CREATININE 1.17 (H) 05/16/2019   BUN 19 05/16/2019   NA 135 05/16/2019   K 4.1 05/16/2019   CL 97 (L) 05/16/2019   CO2 29 05/16/2019     Blood pressure 118/68, pulse 72, temperature 98.1 F (36.7 C), weight 182 lb (82.6 kg), SpO2 96 %.  Medications   Current Outpatient Medications (Cardiovascular):  .  atenolol (TENORMIN) 100 MG tablet, Take 1 tablet Daily for BP .  ezetimibe (ZETIA) 10 MG tablet, Take 1 tablet (10 mg total) by mouth daily. Marland Kitchen  gemfibrozil (LOPID) 600 MG tablet, Take 1 tablet 2 x /day with Meals for Cholesterol .  hydrochlorothiazide (HYDRODIURIL) 25 MG tablet, Take 1 tablet Daily for BP and Fluid Retention  / Ankle Swelling   Current Outpatient Medications (Analgesics):  .  allopurinol (ZYLOPRIM) 300 MG tablet, Take 1 tablet Daily to Prevent Gout   Current Outpatient Medications (Other):  Marland Kitchen  Cholecalciferol (VITAMIN D PO), Take 2,000 Int'l Units by mouth 2 (two) times daily.  .  clonazePAM (KLONOPIN) 2 MG tablet, Take 1/2-1 tablet at Bedtime  ONLY if needed for Sleep &  limit to 5 days /week to avoid addiction .  traZODone (DESYREL) 150 MG tablet, Take 1/2-1 tab daily at bedtime for sleep.  Problem list He has Hyperlipidemia, mixed; Essential hypertension; Idiopathic gout; Abnormal glucose; History of colonic polyps; Gastroesophageal reflux  disease without esophagitis; Vitamin D deficiency; Medication management; OSA on CPAP; Constipation; and Prediabetes on their problem list.    Review of Systems  Constitutional: Negative.  Negative for chills, fatigue and fever.  Respiratory: Negative.   Cardiovascular: Negative.   Gastrointestinal: Negative.   Genitourinary: Positive for dysuria, frequency and urgency. Negative for decreased urine volume, difficulty urinating, discharge, enuresis, flank pain, genital sores, hematuria, penile pain, penile swelling, scrotal swelling and testicular pain.       Dribbling and hesitancy  Musculoskeletal: Negative.  Negative for back pain.  Skin: Negative.  Negative for rash.       Objective:   Physical Exam Constitutional:      General: He is not in acute distress.    Appearance: He is not ill-appearing.  Eyes:     Extraocular Movements: Extraocular movements intact.     Pupils: Pupils are equal, round, and reactive to light.  Cardiovascular:     Rate and Rhythm: Normal rate and regular rhythm.     Heart sounds: No murmur.  Pulmonary:     Effort: Pulmonary effort is normal.     Breath sounds: Normal breath sounds.  Abdominal:     General: Bowel sounds are normal.     Palpations: Abdomen is soft.     Tenderness: There is abdominal tenderness (suprapubic).  Genitourinary:    Comments: Patient declined Musculoskeletal:  General: Normal range of motion.     Cervical back: Normal range of motion and neck supple.  Neurological:     Mental Status: He is alert.       Assessment & Plan:  Jeremy Boone was seen today for acute visit, urinary frequency, other and headache.  Dysuria -     Urinalysis, Routine w reflex microscopic -     Urine Culture -     tamsulosin (FLOMAX) 0.4 MG CAPS capsule; Take 1 capsule (0.4 mg total) by mouth daily after supper. -     fluconazole (DIFLUCAN) 150 MG tablet; Take 1 tablet (150 mg total) by mouth daily. -     sulfamethoxazole-trimethoprim  (BACTRIM DS) 800-160 MG tablet; Take 1 tablet by mouth 2 (two) times daily for 14 days. -     MICROSCOPIC MESSAGE  Will check urine, possibly prostatitis will treat for longer and given flomax  The patient was advised to call immediately if he has any concerning symptoms in the interval. The patient voices understanding of current treatment options and is in agreement with the current care plan.The patient knows to call the clinic with any problems, questions or concerns or go to the ER if any further progression of symptoms.

## 2019-06-01 LAB — URINALYSIS, ROUTINE W REFLEX MICROSCOPIC
Bilirubin Urine: NEGATIVE
Glucose, UA: NEGATIVE
Hyaline Cast: NONE SEEN /LPF
Ketones, ur: NEGATIVE
Nitrite: NEGATIVE
Specific Gravity, Urine: 1.017 (ref 1.001–1.03)
Squamous Epithelial / HPF: NONE SEEN /HPF (ref <=5)
WBC, UA: >=60 /HPF — AB (ref 0–5)
pH: 6 (ref 5.0–8.0)

## 2019-06-01 LAB — URINE CULTURE
MICRO NUMBER:: 10310556
SPECIMEN QUALITY:: ADEQUATE

## 2019-07-15 ENCOUNTER — Other Ambulatory Visit: Payer: Self-pay | Admitting: Internal Medicine

## 2019-07-27 DIAGNOSIS — G4733 Obstructive sleep apnea (adult) (pediatric): Secondary | ICD-10-CM | POA: Diagnosis not present

## 2019-08-07 DIAGNOSIS — H04123 Dry eye syndrome of bilateral lacrimal glands: Secondary | ICD-10-CM | POA: Diagnosis not present

## 2019-08-20 ENCOUNTER — Encounter: Payer: Self-pay | Admitting: Internal Medicine

## 2019-08-20 NOTE — Progress Notes (Signed)
History of Present Illness:      This very nice 85 y.o. MBM from [place] who presents for 6 month follow up with HTN, HLD, Pre-Diabetes and Vitamin D Deficiency. Patient has hx/o Gout controlled on his meds. He also is on CPAP for OSA with improved Sleep Hygiene. Patient also has [place] / LUTS improved with Tamsulosin.       Patient is treated for HTN (1998) & BP has been controlled at home. Today's BP was slightly elevated and rechecked at goal - 134/85. Patient has had no complaints of any cardiac type chest pain, palpitations, dyspnea / orthopnea / PND, dizziness, claudication, or dependent edema.      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 Patient denies myalgias or other med SE's. Last Lipids were not at goal:  Lab Results  Component Value Date   CHOL 244 (H) 05/16/2019   HDL 67 05/16/2019   LDLCALC 156 (H) 05/16/2019   TRIG 97 05/16/2019   CHOLHDL 3.6 05/16/2019    Also, the patient has history of PreDiabetes (A1c 6.3% / 2011)  and has had no symptoms of reactive hypoglycemia, diabetic polys, paresthesias or visual blurring.  Last A1c was not at goal:  Lab Results  Component Value Date   HGBA1C 6.0 (H) 02/09/2019       Further, the patient also has history of Vitamin D Deficiency ("25" / 2008) and supplements vitamin D without any suspected side-effects. Last vitamin D was at goal:  Lab Results  Component Value Date   VD25OH 77 02/09/2019    Current Outpatient Medications on File Prior to Visit  Medication Sig  . allopurinol (ZYLOPRIM) 300 MG tablet Take 1 tablet Daily to Prevent Gout  . atenolol (TENORMIN) 100 MG tablet TAKE 1 TABLET BY MOUTH  DAILY FOR BLOOD PRESSURE  . Cholecalciferol (VITAMIN D PO) Take 2,000 Int'l Units by mouth 2 (two) times daily.   . clonazePAM (KLONOPIN) 2 MG tablet Take 1/2-1 tablet at Bedtime  ONLY if needed for Sleep &  limit to 5 days /week to avoid  addiction  . ezetimibe (ZETIA) 10 MG tablet Take 1 tablet (10 mg total) by mouth daily.  . fluconazole (DIFLUCAN) 150 MG tablet Take 1 tablet (150 mg total) by mouth daily.  Marland Kitchen gemfibrozil (LOPID) 600 MG tablet Take 1 tablet 2 x /day with Meals for Cholesterol  . hydrochlorothiazide (HYDRODIURIL) 25 MG tablet Take 1 tablet Daily for BP and Fluid Retention  / Ankle Swelling  . tamsulosin (FLOMAX) 0.4 MG CAPS capsule Take 1 capsule (0.4 mg total) by mouth daily after supper.  . traZODone (DESYREL) 150 MG tablet Take 1/2-1 tab daily at bedtime for sleep.   No current facility-administered medications on file prior to visit.    Allergies  Allergen Reactions  . Ace Inhibitors Cough  . Lipitor [Atorvastatin]     Elevates CPK and Aldolase    PMHx:   Past Medical History:  Diagnosis Date  . Gout   . Hyperlipidemia   . Hypertension   . Prediabetes     Immunization History  Administered Date(s) Administered  . Influenza, High Dose Seasonal PF 12/18/2013, 10/28/2015, 12/10/2016, 01/26/2018, 11/10/2018  . Influenza,inj,Quad PF,6+ Mos 03/07/2013  . Moderna SARS-COVID-2 Vaccination 03/14/2019  . Pneumococcal Conjugate-13 03/28/2014  . Pneumococcal-Unspecified 03/03/2007  . Td 03/02/2004  . Tdap 10/25/2014  . Zoster 08/26/2016    Past Surgical History:  Procedure Laterality Date  . Cardiac Event Monitor  May-June 2017   Sinus rhythm with average heart rate 74.2 - no notable arrhythmias. Minimal PVCs. No PACs.  . CARPAL TUNNEL RELEASE Left 2007  . OTHER SURGICAL HISTORY  2007   Negative biopsy tonsil mass  . ROTATOR CUFF REPAIR Left 2000    FHx:    Reviewed / unchanged  SHx:    Reviewed / unchanged   Systems Review:  Constitutional: Denies fever, chills, wt changes, headaches, insomnia, fatigue, night sweats, change in appetite. Eyes: Denies redness, blurred vision, diplopia, discharge, itchy, watery eyes.  ENT: Denies discharge, congestion, post nasal drip, epistaxis, sore  throat, earache, hearing loss, dental pain, tinnitus, vertigo, sinus pain, snoring.  CV: Denies chest pain, palpitations, irregular heartbeat, syncope, dyspnea, diaphoresis, orthopnea, PND, claudication or edema. Respiratory: denies cough, dyspnea, DOE, pleurisy, hoarseness, laryngitis, wheezing.  Gastrointestinal: Denies dysphagia, odynophagia, heartburn, reflux, water brash, abdominal pain or cramps, nausea, vomiting, bloating, diarrhea, constipation, hematemesis, melena, hematochezia  or hemorrhoids. Genitourinary: Denies dysuria, frequency, urgency, nocturia, hesitancy, discharge, hematuria or flank pain. Musculoskeletal: Denies arthralgias, myalgias, stiffness, jt. swelling, pain, limping or strain/sprain.  Skin: Denies pruritus, rash, hives, warts, acne, eczema or change in skin lesion(s). Neuro: No weakness, tremor, incoordination, spasms, paresthesia or pain. Psychiatric: Denies confusion, memory loss or sensory loss. Endo: Denies change in weight, skin or hair change.  Heme/Lymph: No excessive bleeding, bruising or enlarged lymph nodes.  Physical Exam  BP 134/85   Pulse (!) 56   Temp (!) 97.1 F (36.2 C)   Resp 16   Ht 5\' 11"  (1.803 m)   Wt 179 lb 6.4 oz (81.4 kg)   BMI 25.02 kg/m   Appears  well nourished, well groomed  and in no distress.  Eyes: PERRLA, EOMs, conjunctiva no swelling or erythema. Sinuses: No frontal/maxillary tenderness ENT/Mouth: EAC's clear, TM's nl w/o erythema, bulging. Nares clear w/o erythema, swelling, exudates. Oropharynx clear without erythema or exudates. Oral hygiene is good. Tongue normal, non obstructing. Hearing intact.  Neck: Supple. Thyroid not palpable. Car 2+/2+ without bruits, nodes or JVD. Chest: Respirations nl with BS clear & equal w/o rales, rhonchi, wheezing or stridor.  Cor: Heart sounds normal w/ regular rate and rhythm without sig. murmurs, gallops, clicks or rubs. Peripheral pulses normal and equal  without edema.  Abdomen: Soft &  bowel sounds normal. Non-tender w/o guarding, rebound, hernias, masses or organomegaly.  Lymphatics: Unremarkable.  Musculoskeletal: Full ROM all peripheral extremities, joint stability, 5/5 strength and normal gait.  Skin: Warm, dry without exposed rashes, lesions or ecchymosis apparent.  Neuro: Cranial nerves intact, reflexes equal bilaterally. Sensory-motor testing grossly intact. Tendon reflexes grossly intact.  Pysch: Alert & oriented x 3.  Insight and judgement nl & appropriate. No ideations.  Assessment and Plan:  1. Essential hypertension  - Continue medication, monitor blood pressure at home.  - Continue DASH diet.  Reminder to go to the ER if any CP,  SOB, nausea, dizziness, severe HA, changes vision/speech.  - CBC with Differential/Platelet - COMPLETE METABOLIC PANEL WITH GFR - Magnesium - TSH  2. Hyperlipidemia, mixed  - Continue diet/meds, exercise,& lifestyle modifications.  - Continue monitor periodic cholesterol/liver & renal functions   - Lipid panel  3. Abnormal glucose  - Continue diet, exercise  - Lifestyle modifications.  - Monitor appropriate labs.  - Hemoglobin A1c - Insulin, random  4. Vitamin D deficiency  - Continue supplementation.  - VITAMIN D 25 Hydroxy  5. Prediabetes  - Hemoglobin  A1c - Insulin, random  6. OSA on CPAP   7. Idiopathic gout  - Uric acid  8. BPH with obstruction/lower urinary tract symptoms   9. Steroid-induced myopathy   10. Medication management  - CBC with Differential/Platelet - COMPLETE METABOLIC PANEL WITH GFR - Magnesium - Lipid panel - TSH - Hemoglobin A1c - Insulin, random - VITAMIN D 25 Hydroxy - Uric acid        Discussed  regular exercise, BP monitoring, weight control to achieve/maintain BMI less than 25 and discussed med and SE's. Recommended labs to assess and monitor clinical status with further disposition pending results of labs.  I discussed the assessment and treatment plan with  the patient. The patient was provided an opportunity to ask questions and all were answered. The patient agreed with the plan and demonstrated an understanding of the instructions.  I provided over 30 minutes of exam, counseling, chart review and  complex critical decision making.         The patient was advised to call back or seek an in-person evaluation if the symptoms worsen or if the condition fails to improve as anticipated.   Marinus Maw, MD

## 2019-08-20 NOTE — Patient Instructions (Signed)

## 2019-08-21 ENCOUNTER — Ambulatory Visit (INDEPENDENT_AMBULATORY_CARE_PROVIDER_SITE_OTHER): Payer: Medicare Other | Admitting: Internal Medicine

## 2019-08-21 ENCOUNTER — Encounter: Payer: Self-pay | Admitting: Internal Medicine

## 2019-08-21 ENCOUNTER — Other Ambulatory Visit: Payer: Self-pay

## 2019-08-21 VITALS — BP 134/85 | HR 56 | Temp 97.1°F | Resp 16 | Ht 71.0 in | Wt 179.4 lb

## 2019-08-21 DIAGNOSIS — N401 Enlarged prostate with lower urinary tract symptoms: Secondary | ICD-10-CM

## 2019-08-21 DIAGNOSIS — I10 Essential (primary) hypertension: Secondary | ICD-10-CM | POA: Diagnosis not present

## 2019-08-21 DIAGNOSIS — E559 Vitamin D deficiency, unspecified: Secondary | ICD-10-CM

## 2019-08-21 DIAGNOSIS — R7309 Other abnormal glucose: Secondary | ICD-10-CM | POA: Diagnosis not present

## 2019-08-21 DIAGNOSIS — Z9989 Dependence on other enabling machines and devices: Secondary | ICD-10-CM

## 2019-08-21 DIAGNOSIS — N138 Other obstructive and reflux uropathy: Secondary | ICD-10-CM

## 2019-08-21 DIAGNOSIS — G4733 Obstructive sleep apnea (adult) (pediatric): Secondary | ICD-10-CM

## 2019-08-21 DIAGNOSIS — Z79899 Other long term (current) drug therapy: Secondary | ICD-10-CM

## 2019-08-21 DIAGNOSIS — T380X5A Adverse effect of glucocorticoids and synthetic analogues, initial encounter: Secondary | ICD-10-CM

## 2019-08-21 DIAGNOSIS — M1000 Idiopathic gout, unspecified site: Secondary | ICD-10-CM

## 2019-08-21 DIAGNOSIS — R7303 Prediabetes: Secondary | ICD-10-CM

## 2019-08-21 DIAGNOSIS — E782 Mixed hyperlipidemia: Secondary | ICD-10-CM | POA: Diagnosis not present

## 2019-08-21 DIAGNOSIS — G720 Drug-induced myopathy: Secondary | ICD-10-CM

## 2019-08-22 LAB — LIPID PANEL
Cholesterol: 187 mg/dL (ref <200)
HDL: 69 mg/dL (ref >=40)
LDL Cholesterol (Calc): 103 mg/dL (calc) — ABNORMAL HIGH
Non-HDL Cholesterol (Calc): 118 mg/dL (calc) (ref <130)
Total CHOL/HDL Ratio: 2.7 (calc) (ref <5.0)
Triglycerides: 60 mg/dL (ref <150)

## 2019-08-22 LAB — CBC WITH DIFFERENTIAL/PLATELET
Absolute Monocytes: 441 cells/uL (ref 200–950)
Basophils Absolute: 29 cells/uL (ref 0–200)
Basophils Relative: 0.7 %
Eosinophils Absolute: 323 cells/uL (ref 15–500)
Eosinophils Relative: 7.7 %
HCT: 42.9 % (ref 38.5–50.0)
Hemoglobin: 14.1 g/dL (ref 13.2–17.1)
Lymphs Abs: 1999 cells/uL (ref 850–3900)
MCH: 30.1 pg (ref 27.0–33.0)
MCHC: 32.9 g/dL (ref 32.0–36.0)
MCV: 91.5 fL (ref 80.0–100.0)
MPV: 9.8 fL (ref 7.5–12.5)
Monocytes Relative: 10.5 %
Neutro Abs: 1407 cells/uL — ABNORMAL LOW (ref 1500–7800)
Neutrophils Relative %: 33.5 %
Platelets: 299 10*3/uL (ref 140–400)
RBC: 4.69 10*6/uL (ref 4.20–5.80)
RDW: 13.2 % (ref 11.0–15.0)
Total Lymphocyte: 47.6 %
WBC: 4.2 10*3/uL (ref 3.8–10.8)

## 2019-08-22 LAB — VITAMIN D 25 HYDROXY (VIT D DEFICIENCY, FRACTURES): Vit D, 25-Hydroxy: 66 ng/mL (ref 30–100)

## 2019-08-22 LAB — COMPLETE METABOLIC PANEL WITH GFR
AG Ratio: 1.2 (calc) (ref 1.0–2.5)
ALT: 8 U/L — ABNORMAL LOW (ref 9–46)
AST: 17 U/L (ref 10–35)
Albumin: 4.1 g/dL (ref 3.6–5.1)
Alkaline phosphatase (APISO): 57 U/L (ref 35–144)
BUN: 16 mg/dL (ref 7–25)
CO2: 27 mmol/L (ref 20–32)
Calcium: 9.7 mg/dL (ref 8.6–10.3)
Chloride: 102 mmol/L (ref 98–110)
Creat: 1.06 mg/dL (ref 0.70–1.11)
GFR, Est African American: 75 mL/min/{1.73_m2} (ref >=60)
GFR, Est Non African American: 65 mL/min/{1.73_m2} (ref >=60)
Globulin: 3.4 g/dL (calc) (ref 1.9–3.7)
Glucose, Bld: 83 mg/dL (ref 65–99)
Potassium: 4.2 mmol/L (ref 3.5–5.3)
Sodium: 137 mmol/L (ref 135–146)
Total Bilirubin: 0.4 mg/dL (ref 0.2–1.2)
Total Protein: 7.5 g/dL (ref 6.1–8.1)

## 2019-08-22 LAB — INSULIN, RANDOM: Insulin: 4.8 u[IU]/mL

## 2019-08-22 LAB — MAGNESIUM: Magnesium: 2 mg/dL (ref 1.5–2.5)

## 2019-08-22 LAB — URIC ACID: Uric Acid, Serum: 5 mg/dL (ref 4.0–8.0)

## 2019-08-22 LAB — HEMOGLOBIN A1C
Hgb A1c MFr Bld: 5.8 % of total Hgb — ABNORMAL HIGH (ref <5.7)
Mean Plasma Glucose: 120 (calc)
eAG (mmol/L): 6.6 (calc)

## 2019-08-22 LAB — TSH: TSH: 2.35 mIU/L (ref 0.40–4.50)

## 2019-08-22 NOTE — Progress Notes (Signed)
==============================================================  -    Total Chol = 187 - Good - Near goal  ( Ideal or goal is less than 180)  - LDL Chol = 103 - also slightly elevated  (Ideal or goal is less than 70 ! )   - So........... -  Recommend  a stricter low cholesterol diet   - Cholesterol only comes from animal sources  - ie. meat, dairy, egg yolks  - Eat all the vegetables you want.  - Avoid meat, especially red meat - Beef AND Pork .  - Avoid cheese & dairy - milk & ice cream.     - Cheese is the most concentrated form of trans-fats which  is the worst thing to clog up our arteries.   - Veggie cheese is OK which can be found in the fresh  produce section at [employer] or [employer] or [employer] ==============================================================  -  A1c - better - down from 6.0% to now 5.8% - Great   - almost to goal which is less than 5.7% ==============================================================  -  Vitamin D = 66 - Excellent  ==============================================================  -  Uric acid / Gout test is normal & OK  ==============================================================

## 2019-08-30 ENCOUNTER — Other Ambulatory Visit: Payer: Self-pay | Admitting: Internal Medicine

## 2019-08-30 DIAGNOSIS — G4700 Insomnia, unspecified: Secondary | ICD-10-CM

## 2019-10-31 DIAGNOSIS — G4733 Obstructive sleep apnea (adult) (pediatric): Secondary | ICD-10-CM | POA: Diagnosis not present

## 2019-11-20 ENCOUNTER — Ambulatory Visit: Payer: Medicare Other | Admitting: Physician Assistant

## 2019-11-21 DIAGNOSIS — G720 Drug-induced myopathy: Secondary | ICD-10-CM | POA: Insufficient documentation

## 2019-11-21 DIAGNOSIS — T466X5A Adverse effect of antihyperlipidemic and antiarteriosclerotic drugs, initial encounter: Secondary | ICD-10-CM | POA: Insufficient documentation

## 2019-11-21 NOTE — Progress Notes (Signed)
FOLLOW UP  Assessment and Plan:   Essential hypertension -     CBC with Differential/Platelet -     COMPLETE METABOLIC PANEL WITH GFR -     TSH - continue medications, DASH diet, exercise and monitor at home. Call if greater than 130/80.   Hyperlipidemia, mixed -     Lipid panel check lipids decrease fatty foods increase activity.   Statin myopathy On zetia  Abnormal glucose -     Hemoglobin A1c Discussed disease progression and risks Discussed diet/exercise, weight management and risk modification  Sinus congestion No fever, no chills, no SOB, no cough More sinus/allergies Will hold the zpak and take if she is not getting better, increase fluids, rest, cont allergy pill -     triamcinolone (NASACORT) 55 MCG/ACT AERO nasal inhaler; Place 2 sprays into the nose at bedtime. -     azithromycin (ZITHROMAX) 250 MG tablet; Take 2 tablets (500 mg) on  Day 1,  followed by 1 tablet (250 mg) once daily on Days 2 through 5.     Continue diet and meds as discussed. Further disposition pending results of labs. Over 30 minutes of exam, counseling, chart review, and critical decision making was performed  Future Appointments  Date Time Provider Department Center  03/12/2020  3:00 PM [name], MD [facility] None  06/18/2020  3:00 PM Elder Negus, NP [facility] None     HPI 85 y.o. male  presents for 3 month follow up on hypertension, cholesterol, prediabetes, and vitamin D deficiency.   He has had sinus congestion x 5 days, no fever, no chills. He has been taking OTC sinus med/fisherman's friend. Feels he may be improving some but the mucus is getting thicker.   His blood pressure has been controlled at home, today their BP is BP: 122/80  BMI is Body mass index is 24.41 kg/m., he is working on diet and exercise. Wt Readings from Last 3 Encounters:  11/22/19 175 lb (79.4 kg)  08/21/19 179 lb 6.4 oz (81.4 kg)  05/30/19 182 lb (82.6 kg)    He does not workout. He  denies chest pain, shortness of breath, dizziness.   He  is  on cholesterol medication, lopid and zetia due to statin intolerance with elevated CPK in the past. His cholesterol is at goal. The cholesterol last visit was:   Lab Results  Component Value Date   CHOL 187 08/21/2019   HDL 69 08/21/2019   LDLCALC 103 (H) 08/21/2019   TRIG 60 08/21/2019   CHOLHDL 2.7 08/21/2019     He has been working on diet and exercise for prediabetes, since his wife has dementia,  He is doing the cooking and has increased his veggies and denies paresthesia of the feet, polydipsia, polyuria and visual disturbances. Last A1C in the office was:  Lab Results  Component Value Date   HGBA1C 5.8 (H) 08/21/2019   Patient is on Vitamin D supplement.   Lab Results  Component Value Date   VD25OH 66 08/21/2019      Current Medications:    Current Outpatient Medications (Cardiovascular):  .  atenolol (TENORMIN) 100 MG tablet, TAKE 1 TABLET BY MOUTH  DAILY FOR BLOOD PRESSURE .  ezetimibe (ZETIA) 10 MG tablet, Take 1 tablet (10 mg total) by mouth daily. Marland Kitchen  gemfibrozil (LOPID) 600 MG tablet, Take 1 tablet 2 x /day with Meals for Cholesterol .  hydrochlorothiazide (HYDRODIURIL) 25 MG tablet, Take 1 tablet Daily for BP and Fluid Retention  / Ankle  Swelling   Current Outpatient Medications (Analgesics):  .  allopurinol (ZYLOPRIM) 300 MG tablet, Take 1 tablet Daily to Prevent Gout   Current Outpatient Medications (Other):  Marland Kitchen  Cholecalciferol (VITAMIN D PO), Take 2,000 Int'l Units by mouth 2 (two) times daily.  .  clonazePAM (KLONOPIN) 2 MG tablet, Take 1/2 - 1 tablet at Bedtime  ONLY if needed for Sleep &  limit to 5 days /week to avoid Addiction & Dementia .  fluconazole (DIFLUCAN) 150 MG tablet, Take 1 tablet (150 mg total) by mouth daily. .  tamsulosin (FLOMAX) 0.4 MG CAPS capsule, Take 1 capsule (0.4 mg total) by mouth daily after supper. .  traZODone (DESYREL) 150 MG tablet, Take 1/2-1 tab daily at bedtime  for sleep.  Medical History:  Past Medical History:  Diagnosis Date  . Gout   . Hyperlipidemia   . Hypertension   . Prediabetes    Allergies:  Allergies  Allergen Reactions  . Ace Inhibitors Cough  . Lipitor [Atorvastatin]     Elevates CPK and Aldolase     Review of Systems:  ROS  Family history- Review and unchanged Social history- Review and unchanged Physical Exam: BP 122/80   Pulse 73   Temp (!) 97.3 F (36.3 C)   Wt 175 lb (79.4 kg)   SpO2 96%   BMI 24.41 kg/m  Wt Readings from Last 3 Encounters:  11/22/19 175 lb (79.4 kg)  08/21/19 179 lb 6.4 oz (81.4 kg)  05/30/19 182 lb (82.6 kg)   General Appearance: Well nourished, in no apparent distress. Eyes: PERRLA, EOMs, conjunctiva no swelling or erythema Sinuses: No Frontal/maxillary tenderness ENT/Mouth: Ext aud canals clear, TMs without erythema, bulging. No erythema, swelling, or exudate on post pharynx.  Tonsils not swollen or erythematous. Hearing normal.  Neck: Supple, thyroid normal.  Respiratory: Respiratory effort normal, BS equal bilaterally without rales, rhonchi, wheezing or stridor.  Cardio: RRR with no MRGs. Brisk peripheral pulses without edema.  Abdomen: Soft, + BS,  Non tender, no guarding, rebound, hernias, masses. Lymphatics: Non tender without lymphadenopathy.  Musculoskeletal: Full ROM, 5/5 strength, Normal gait Skin: Warm, dry without rashes, lesions, ecchymosis.  Neuro: Cranial nerves intact. Normal muscle tone, no cerebellar symptoms. Psych: Awake and oriented X 3, normal affect, Insight and Judgment appropriate.    Quentin Mulling, PA-C 2:41 PM [facility]

## 2019-11-22 ENCOUNTER — Encounter: Payer: Self-pay | Admitting: Physician Assistant

## 2019-11-22 ENCOUNTER — Other Ambulatory Visit: Payer: Self-pay

## 2019-11-22 ENCOUNTER — Ambulatory Visit (INDEPENDENT_AMBULATORY_CARE_PROVIDER_SITE_OTHER): Payer: Medicare Other | Admitting: Physician Assistant

## 2019-11-22 VITALS — BP 122/80 | HR 73 | Temp 97.3°F | Wt 175.0 lb

## 2019-11-22 DIAGNOSIS — R7309 Other abnormal glucose: Secondary | ICD-10-CM | POA: Diagnosis not present

## 2019-11-22 DIAGNOSIS — E782 Mixed hyperlipidemia: Secondary | ICD-10-CM

## 2019-11-22 DIAGNOSIS — I10 Essential (primary) hypertension: Secondary | ICD-10-CM | POA: Diagnosis not present

## 2019-11-22 DIAGNOSIS — R0981 Nasal congestion: Secondary | ICD-10-CM | POA: Diagnosis not present

## 2019-11-22 DIAGNOSIS — G720 Drug-induced myopathy: Secondary | ICD-10-CM

## 2019-11-22 MED ORDER — AZITHROMYCIN 250 MG PO TABS: ORAL_TABLET | ORAL | 1 refills | Status: AC

## 2019-11-22 MED ORDER — TRIAMCINOLONE ACETONIDE 55 MCG/ACT NA AERO: 2.0000 | INHALATION_SPRAY | Freq: Every day | NASAL | 3 refills | Status: DC

## 2019-11-22 NOTE — Patient Instructions (Signed)
Can do a steroid nasal spary 1-2 sparys at night each nostril.  Examples are nasonex, flonase, nasocort- they are over the counter.  Remember to spray each nostril twice towards the outer part of your eye.   Do not sniff but instead pinch your nose and tilt your head back to help the medicine get into your sinuses.   The best time to do this is at bedtime.  Stop if you get blurred vision or nose bleeds.   THIS WILL TAKE 7 DAYS TO WORK AND IS BETTER IF YOU START BEFORE SYMPTOMS SO IF YOU HAVE A SEASON OR TIME OF THE YEAR YOU ALWAYS GET A COLD, START BEFORE THAT!  -Please pick one of the over the counter allergy medications below and take it once daily for allergies.  It will also help with fluid behind ear drums. Claritin or loratadine cheapest but likely the weakest  Zyrtec or certizine at night because it can make you sleepy The strongest is allegra or fexafinadine  Cheapest at [employer], [employer], [employer]  -can use decongestant over the counter, please do not use if you have high blood pressure or certain heart conditions.   if worsening HA, changes vision/speech, imbalance, weakness go to the ER  HOW TO TREAT VIRAL COUGH AND COLD SYMPTOMS:  -Symptoms usually last at least 1 week with the worst symptoms being around day 4.  - colds usually start with a sore throat and end with a cough, and the cough can take 2 weeks to get better.  -No antibiotics are needed for colds, flu, sore throats, cough, bronchitis UNLESS symptoms are longer than 7 days OR if you are getting better then get drastically worse.  -There are a lot of combination medications (Dayquil, Nyquil, Vicks 44, tyelnol cold and sinus, ETC). Please look at the ingredients on the back so that you are treating the correct symptoms and not doubling up on medications/ingredients.    Medicines you can use  Nasal congestion  Little Remedies saline spray (aerosol/mist)- can try this, it is in the kids section - pseudoephedrine (Sudafed)-  behind the counter, do not use if you have high blood pressure, medicine that have -D in them.  - phenylephrine (Sudafed PE) -Dextormethorphan + chlorpheniramine (Coridcidin HBP)- okay if you have high blood pressure -Oxymetazoline (Afrin) nasal spray- LIMIT to 3 days -Saline nasal spray -Neti pot (used distilled or bottled water)  Ear pain/congestion  -pseudoephedrine (sudafed) - Nasonex/flonase nasal spray  Fever  -Acetaminophen (Tyelnol) -Ibuprofen (Advil, motrin, aleve)  Sore Throat  -Acetaminophen (Tyelnol) -Ibuprofen (Advil, motrin, aleve) -Drink a lot of water -Gargle with salt water - Rest your voice (don't talk) -Throat sprays -Cough drops  Body Aches  -Acetaminophen (Tyelnol) -Ibuprofen (Advil, motrin, aleve)  Headache  -Acetaminophen (Tyelnol) -Ibuprofen (Advil, motrin, aleve) - Exedrin, Exedrin Migraine  Allergy symptoms (cough, sneeze, runny nose, itchy eyes) -Claritin or loratadine cheapest but likely the weakest  -Zyrtec or certizine at night because it can make you sleepy -The strongest is allegra or fexafinadine  Cheapest at [employer], [employer], [employer]  Cough  -Dextromethorphan (Delsym)- medicine that has DM in it -Guafenesin (Mucinex/Robitussin) - cough drops - drink lots of water  Chest Congestion  -Guafenesin (Mucinex/Robitussin)  Red Itchy Eyes  - Naphcon-A  Upset Stomach  - Bland diet (nothing spicy, greasy, fried, and high acid foods like tomatoes, oranges, berries) -OKAY- cereal, bread, soup, crackers, rice -Eat smaller more frequent meals -reduce caffeine, no alcohol -Loperamide (Imodium-AD) if diarrhea -Prevacid for heart burn  General health when sick  -Hydration -wash your hands frequently -keep surfaces clean -change pillow cases and sheets often -Get fresh air but do not exercise strenuously -Vitamin D, double up on it - Vitamin C -Zinc

## 2019-11-23 LAB — CBC WITH DIFFERENTIAL/PLATELET
Absolute Monocytes: 462 cells/uL (ref 200–950)
Basophils Absolute: 31 cells/uL (ref 0–200)
Basophils Relative: 0.7 %
Eosinophils Absolute: 374 cells/uL (ref 15–500)
Eosinophils Relative: 8.5 %
HCT: 45.1 % (ref 38.5–50.0)
Hemoglobin: 15.1 g/dL (ref 13.2–17.1)
Lymphs Abs: 2257 cells/uL (ref 850–3900)
MCH: 30.4 pg (ref 27.0–33.0)
MCHC: 33.5 g/dL (ref 32.0–36.0)
MCV: 90.7 fL (ref 80.0–100.0)
MPV: 9.7 fL (ref 7.5–12.5)
Monocytes Relative: 10.5 %
Neutro Abs: 1276 cells/uL — ABNORMAL LOW (ref 1500–7800)
Neutrophils Relative %: 29 %
Platelets: 254 10*3/uL (ref 140–400)
RBC: 4.97 10*6/uL (ref 4.20–5.80)
RDW: 13 % (ref 11.0–15.0)
Total Lymphocyte: 51.3 %
WBC: 4.4 10*3/uL (ref 3.8–10.8)

## 2019-11-23 LAB — HEMOGLOBIN A1C
Hgb A1c MFr Bld: 5.9 % of total Hgb — ABNORMAL HIGH (ref <5.7)
Mean Plasma Glucose: 123 (calc)
eAG (mmol/L): 6.8 (calc)

## 2019-11-23 LAB — COMPLETE METABOLIC PANEL WITH GFR
AG Ratio: 1.2 (calc) (ref 1.0–2.5)
ALT: 8 U/L — ABNORMAL LOW (ref 9–46)
AST: 21 U/L (ref 10–35)
Albumin: 4.4 g/dL (ref 3.6–5.1)
Alkaline phosphatase (APISO): 63 U/L (ref 35–144)
BUN/Creatinine Ratio: 16 (calc) (ref 6–22)
BUN: 19 mg/dL (ref 7–25)
CO2: 29 mmol/L (ref 20–32)
Calcium: 9.9 mg/dL (ref 8.6–10.3)
Chloride: 98 mmol/L (ref 98–110)
Creat: 1.18 mg/dL — ABNORMAL HIGH (ref 0.70–1.11)
GFR, Est African American: 66 mL/min/{1.73_m2} (ref >=60)
GFR, Est Non African American: 57 mL/min/{1.73_m2} — ABNORMAL LOW (ref >=60)
Globulin: 3.7 g/dL (calc) (ref 1.9–3.7)
Glucose, Bld: 74 mg/dL (ref 65–99)
Potassium: 4.1 mmol/L (ref 3.5–5.3)
Sodium: 137 mmol/L (ref 135–146)
Total Bilirubin: 0.5 mg/dL (ref 0.2–1.2)
Total Protein: 8.1 g/dL (ref 6.1–8.1)

## 2019-11-23 LAB — LIPID PANEL
Cholesterol: 191 mg/dL (ref <200)
HDL: 65 mg/dL (ref >=40)
LDL Cholesterol (Calc): 110 mg/dL (calc) — ABNORMAL HIGH
Non-HDL Cholesterol (Calc): 126 mg/dL (calc) (ref <130)
Total CHOL/HDL Ratio: 2.9 (calc) (ref <5.0)
Triglycerides: 70 mg/dL (ref <150)

## 2019-11-23 LAB — TSH: TSH: 1.86 mIU/L (ref 0.40–4.50)

## 2019-12-12 ENCOUNTER — Other Ambulatory Visit: Payer: Self-pay | Admitting: Physician Assistant

## 2019-12-12 ENCOUNTER — Other Ambulatory Visit: Payer: Self-pay | Admitting: Internal Medicine

## 2019-12-12 ENCOUNTER — Other Ambulatory Visit: Payer: Self-pay | Admitting: Adult Health Nurse Practitioner

## 2019-12-12 DIAGNOSIS — G4700 Insomnia, unspecified: Secondary | ICD-10-CM

## 2019-12-12 DIAGNOSIS — R300 Dysuria: Secondary | ICD-10-CM

## 2020-01-15 ENCOUNTER — Other Ambulatory Visit: Payer: Self-pay | Admitting: Internal Medicine

## 2020-01-15 DIAGNOSIS — G4700 Insomnia, unspecified: Secondary | ICD-10-CM

## 2020-01-29 DIAGNOSIS — G4733 Obstructive sleep apnea (adult) (pediatric): Secondary | ICD-10-CM | POA: Diagnosis not present

## 2020-02-21 ENCOUNTER — Ambulatory Visit: Payer: Medicare Other | Admitting: Adult Health Nurse Practitioner

## 2020-03-11 ENCOUNTER — Encounter: Payer: Self-pay | Admitting: Internal Medicine

## 2020-03-11 NOTE — Progress Notes (Signed)
Annual  Screening/Preventative Visit  & Comprehensive Evaluation & Examination      This very nice 86 y.o.  MBM  from [place] who presents for a Screening /Preventative Visit & comprehensive evaluation and management of multiple medical co-morbidities.  Patient has been followed for HTN, HLD, Prediabetes and Vitamin D Deficiency.  Patient' has hx/o Gout quiescent on his Allopurinol. Patient is  on CPAP for OSA  with improved sleep hygiene.       HTN predates circa 1998. Patient's BP has been controlled at home.  Today's BP is at goal -  126/78. Patient denies any cardiac symptoms as chest pain, palpitations, shortness of breath, dizziness or ankle swelling.     Patient has hx/o Statin Myopathy with hx/o elevated CPK & Aldolase  and his hyperlipidemia is not controlled with diet and Gemfibrozil. Patient denies myalgias or other medication SE's. Last lipids were not at goal:  Lab Results  Component Value Date   CHOL 191 11/22/2019   HDL 65 11/22/2019   LDLCALC 110 (H) 11/22/2019   TRIG 70 11/22/2019   CHOLHDL 2.9 11/22/2019       Patient has hx/o prediabetes (A1c 6.3% /2011) and patient denies reactive hypoglycemic symptoms, visual blurring, diabetic polys or paresthesias. Last A1c was not at goal:   Lab Results  Component Value Date   HGBA1C 5.9 (H) 11/22/2019        Finally, patient has history of Vitamin D Deficiency  ("25" /2008) and last vitamin D is at goal:   Lab Results  Component Value Date   VD25OH 66 08/21/2019    Current Outpatient Medications on File Prior to Visit  Medication Sig   allopurinol 300 MG tablet Take 1 tablet Daily to Prevent Gout   atenolol 100 MG tablet TAKE 1 TABLET BY MOUTH  DAILY FOR BLOOD PRESSURE   VITAMIN D  Take 2,000 Int'l Units by mouth 2 (two) times daily.    clonazePAM 2 MG tablet Take       1/2 - 1 tablet     at Bedtime       ONLY      if needed for Sleep &  limit to 5 days /week to avoid Addiction & Dementia   ezetimibe  10 MG  tablet Take 1 tablet (10 mg total) by mouth daily.   fluconazole 150 MG tablet Take     1 tablet      2 x /week      as needed for Yeast Infection   gemfibrozil  600 MG tablet Take 1 tablet 2 x /day with Meals for Cholesterol   hydrochlorothiazide  25 MG Take 1 tablet Daily for BP and Fluid Retention  / Ankle Swelling   tamsulosin  0.4 MG CAPS  Take 1 capsule (0.4 mg total) by mouth daily after supper.   traZODone ) 150 MG tablet Take     1 tablet     1 hour      before Bedtime       if needed for Sleep   NASACORT nasal inhaler Place 2 sprays into the nose at bedtime.     Allergies  Allergen Reactions   Ace Inhibitors Cough   Lipitor [Atorvastatin]     Elevates CPK and Aldolase    Past Medical History:  Diagnosis Date   Gout    Hyperlipidemia    Hypertension    Prediabetes    Health Maintenance  Topic Date Due   INFLUENZA VACCINE  10/01/2019  COVID-19 Vaccine (3 - Booster for Moderna series) 10/09/2019   TETANUS/TDAP  10/24/2024   PNA vac Low Risk Adult  Completed   Immunization History  Administered Date(s) Administered   Influenza, High Dose Seasonal PF 03/28/2017, 11/10/2018   Influenza,inj,Quad PF,6+ Mos 03/07/2013   Moderna Sars-Covid-2 Vaccination 03/14/2019, 04/11/2019   Pneumococcal Conjugate-13 03/28/2014   Pneumococcal-Unspecified 03/03/2007   Td 03/02/2004   Tdap 10/25/2014   Zoster 08/26/2016   Last Colon  - 10/13/2007 - Dr Arlyce Dice - hyperplastic polyps  - No F/u recommended ? Due to age.  Past Surgical History:  Procedure Laterality Date   Cardiac Event Monitor  May-June 2017   Sinus rhythm with average heart rate 74.2 - no notable arrhythmias. Minimal PVCs. No PACs.   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 Does c/o fatigue. Eyes: Denies redness, blurred vision, diplopia, discharge, itchy or watery eyes.  ENT: Denies discharge, congestion, post nasal drip, epistaxis, sore throat, earache, hearing loss, dental pain, Tinnitus, Vertigo, Sinus pain or snoring.  Cardio: Denies chest pain, palpitations, irregular heartbeat, syncope, dyspnea, diaphoresis, orthopnea, PND, claudication or edema Respiratory: denies cough, dyspnea, DOE, pleurisy, hoarseness, laryngitis or wheezing.  Gastrointestinal: Denies dysphagia, heartburn, reflux, water brash, pain, cramps, nausea, vomiting, bloating, diarrhea, constipation, hematemesis, melena, hematochezia, jaundice or hemorrhoids Genitourinary: Denies dysuria, frequency, urgency, nocturia, hesitancy, discharge, hematuria or flank pain Musculoskeletal: Denies arthralgia, myalgia, stiffness, Jt. Swelling, pain, limp or strain/sprain. Denies Falls. Skin: Denies puritis, rash, hives, warts, acne, eczema or change in skin lesion Neuro: No weakness, tremor, incoordination, spasms, paresthesia or pain Psychiatric: Denies confusion,  memory loss or sensory loss. Denies Depression. Endocrine: Denies change in weight, skin, hair change, nocturia, and paresthesia, diabetic polys, visual blurring or hyper / hypo glycemic episodes.  Heme/Lymph: No excessive bleeding, bruising or enlarged lymph nodes.  Physical Exam  BP 126/78    Pulse 77    Temp (!) 97 F (36.1 C)    Resp 16    Ht 5\' 11"  (1.803 m)    Wt 178 lb 3.2 oz (80.8 kg)    SpO2 95%    BMI 24.85 kg/m   General Appearance: Well nourished and well  groomed and in no apparent distress.  Eyes: PERRLA, EOMs, conjunctiva no swelling or erythema, normal fundi and vessels. Sinuses: No frontal/maxillary tenderness ENT/Mouth: EACs patent / TMs  nl. Nares clear without erythema, swelling, mucoid exudates. Oral hygiene is good. No erythema, swelling, or exudate. Tongue normal, non-obstructing. Tonsils not swollen or erythematous. Hearing normal.  Neck: Supple, thyroid not palpable. No bruits, nodes or JVD. Respiratory: Respiratory effort normal.  BS equal and clear bilateral without rales, rhonci, wheezing or stridor. Cardio: Heart sounds are normal with regular rate and rhythm and no murmurs, rubs or gallops. Peripheral pulses are normal and equal bilaterally without edema. No aortic or femoral bruits. Chest: symmetric with normal excursions and percussion.  Abdomen: Soft, with Nl bowel sounds. Nontender, no guarding, rebound, hernias, masses, or organomegaly.  Lymphatics: Non tender without lymphadenopathy.  Musculoskeletal: Full ROM all peripheral extremities, joint stability, 5/5 strength, and normal gait. Skin: Warm and dry without rashes, lesions, cyanosis, clubbing or  ecchymosis.  Neuro: Cranial nerves intact, reflexes equal bilaterally. Normal muscle tone, no cerebellar symptoms. Sensation intact.  Pysch: Alert and oriented X 3 with normal affect, insight and judgment appropriate.   Assessment and Plan  1. Annual Preventative/Screening Exam   1. Encounter for general  adult medical examination with abnormal findings   2. Essential hypertension  - EKG 12-Lead - [place], RETROPERITNL ABD,  LTD - Urinalysis, Routine w reflex microscopic - Microalbumin / creatinine urine ratio - CBC with Differential/Platelet - COMPLETE METABOLIC PANEL WITH GFR - Magnesium - TSH  3. Hyperlipidemia, mixed  - EKG 12-Lead - [place], RETROPERITNL ABD,  LTD - Lipid panel - TSH  4. Abnormal glucose  - EKG 12-Lead - [place], RETROPERITNL ABD,  LTD - Hemoglobin A1c - Insulin, random  5. Vitamin D deficiency  - VITAMIN D 25 Hydroxy   6. Prediabetes  - Hemoglobin A1c - Insulin, random  7. OSA on CPAP   8. Idiopathic gout, unspecified chronicity, unspecified site  - Uric acid  9. BPH with obstruction/lower urinary tract symptoms  - PSA  10. Statin myopathy  - CK  11. Prostate cancer screening  - PSA  12. Screening for colorectal cancer  - POC Hemoccult Bld/Stl   13. Screening for ischemic heart disease  - EKG 12-Lead - Lipid panel  14. FH: hypertension  - EKG 12-Lead - [place], RETROPERITNL ABD,  LTD  15. Screening for AAA (aortic abdominal aneurysm)  - [place], RETROPERITNL ABD,  LTD  16. Medication management  - Urinalysis, Routine w reflex microscopic - Microalbumin / creatinine urine ratio - Uric acid - CBC with Differential/Platelet - COMPLETE METABOLIC PANEL WITH GFR - Magnesium - Lipid panel - TSH - Hemoglobin A1c - Insulin, random - VITAMIN D 25 Hydroxy           Patient was counseled in prudent diet, weight control to achieve/maintain BMI less than 25, BP monitoring, regular exercise and medications as discussed.  Discussed med effects and SE's. Routine screening labs and tests as requested with regular follow-up as recommended. Over 40 minutes of exam, counseling, chart review and high complex critical decision making was performed   Marinus Maw, MD

## 2020-03-11 NOTE — Patient Instructions (Signed)

## 2020-03-12 ENCOUNTER — Encounter: Payer: Self-pay | Admitting: Internal Medicine

## 2020-03-12 ENCOUNTER — Ambulatory Visit (INDEPENDENT_AMBULATORY_CARE_PROVIDER_SITE_OTHER): Payer: Medicare Other | Admitting: Internal Medicine

## 2020-03-12 ENCOUNTER — Other Ambulatory Visit: Payer: Self-pay

## 2020-03-12 VITALS — BP 126/78 | HR 77 | Temp 97.0°F | Resp 16 | Ht 71.0 in | Wt 178.2 lb

## 2020-03-12 DIAGNOSIS — Z1211 Encounter for screening for malignant neoplasm of colon: Secondary | ICD-10-CM

## 2020-03-12 DIAGNOSIS — E559 Vitamin D deficiency, unspecified: Secondary | ICD-10-CM

## 2020-03-12 DIAGNOSIS — Z0000 Encounter for general adult medical examination without abnormal findings: Secondary | ICD-10-CM | POA: Diagnosis not present

## 2020-03-12 DIAGNOSIS — Z8249 Family history of ischemic heart disease and other diseases of the circulatory system: Secondary | ICD-10-CM

## 2020-03-12 DIAGNOSIS — G4733 Obstructive sleep apnea (adult) (pediatric): Secondary | ICD-10-CM

## 2020-03-12 DIAGNOSIS — Z79899 Other long term (current) drug therapy: Secondary | ICD-10-CM

## 2020-03-12 DIAGNOSIS — Z136 Encounter for screening for cardiovascular disorders: Secondary | ICD-10-CM

## 2020-03-12 DIAGNOSIS — E782 Mixed hyperlipidemia: Secondary | ICD-10-CM

## 2020-03-12 DIAGNOSIS — Z125 Encounter for screening for malignant neoplasm of prostate: Secondary | ICD-10-CM

## 2020-03-12 DIAGNOSIS — Z0001 Encounter for general adult medical examination with abnormal findings: Secondary | ICD-10-CM

## 2020-03-12 DIAGNOSIS — R7303 Prediabetes: Secondary | ICD-10-CM | POA: Diagnosis not present

## 2020-03-12 DIAGNOSIS — Z9989 Dependence on other enabling machines and devices: Secondary | ICD-10-CM

## 2020-03-12 DIAGNOSIS — G720 Drug-induced myopathy: Secondary | ICD-10-CM

## 2020-03-12 DIAGNOSIS — R7309 Other abnormal glucose: Secondary | ICD-10-CM

## 2020-03-12 DIAGNOSIS — I10 Essential (primary) hypertension: Secondary | ICD-10-CM | POA: Diagnosis not present

## 2020-03-12 DIAGNOSIS — T466X5A Adverse effect of antihyperlipidemic and antiarteriosclerotic drugs, initial encounter: Secondary | ICD-10-CM | POA: Diagnosis not present

## 2020-03-12 DIAGNOSIS — M1000 Idiopathic gout, unspecified site: Secondary | ICD-10-CM

## 2020-03-12 DIAGNOSIS — N138 Other obstructive and reflux uropathy: Secondary | ICD-10-CM

## 2020-03-12 LAB — CK: Total CK: 270 U/L — ABNORMAL HIGH (ref 44–196)

## 2020-03-13 ENCOUNTER — Other Ambulatory Visit: Payer: Self-pay | Admitting: Internal Medicine

## 2020-03-13 DIAGNOSIS — N179 Acute kidney failure, unspecified: Secondary | ICD-10-CM

## 2020-03-13 DIAGNOSIS — N3000 Acute cystitis without hematuria: Secondary | ICD-10-CM

## 2020-03-13 LAB — CBC WITH DIFFERENTIAL/PLATELET
Absolute Monocytes: 416 cells/uL (ref 200–950)
Basophils Absolute: 40 cells/uL (ref 0–200)
Basophils Relative: 1 %
Eosinophils Absolute: 268 cells/uL (ref 15–500)
Eosinophils Relative: 6.7 %
HCT: 42.8 % (ref 38.5–50.0)
Hemoglobin: 14.6 g/dL (ref 13.2–17.1)
Lymphs Abs: 1480 cells/uL (ref 850–3900)
MCH: 30.9 pg (ref 27.0–33.0)
MCHC: 34.1 g/dL (ref 32.0–36.0)
MCV: 90.7 fL (ref 80.0–100.0)
MPV: 9.8 fL (ref 7.5–12.5)
Monocytes Relative: 10.4 %
Neutro Abs: 1796 cells/uL (ref 1500–7800)
Neutrophils Relative %: 44.9 %
Platelets: 251 10*3/uL (ref 140–400)
RBC: 4.72 10*6/uL (ref 4.20–5.80)
RDW: 13.1 % (ref 11.0–15.0)
Total Lymphocyte: 37 %
WBC: 4 10*3/uL (ref 3.8–10.8)

## 2020-03-13 LAB — COMPLETE METABOLIC PANEL WITH GFR
AG Ratio: 1.3 (calc) (ref 1.0–2.5)
ALT: 10 U/L (ref 9–46)
AST: 21 U/L (ref 10–35)
Albumin: 4.3 g/dL (ref 3.6–5.1)
Alkaline phosphatase (APISO): 64 U/L (ref 35–144)
BUN/Creatinine Ratio: 22 (calc) (ref 6–22)
BUN: 34 mg/dL — ABNORMAL HIGH (ref 7–25)
CO2: 30 mmol/L (ref 20–32)
Calcium: 9.3 mg/dL (ref 8.6–10.3)
Chloride: 99 mmol/L (ref 98–110)
Creat: 1.57 mg/dL — ABNORMAL HIGH (ref 0.70–1.11)
GFR, Est African American: 47 mL/min/{1.73_m2} — ABNORMAL LOW (ref >=60)
GFR, Est Non African American: 40 mL/min/{1.73_m2} — ABNORMAL LOW (ref >=60)
Globulin: 3.2 g/dL (calc) (ref 1.9–3.7)
Glucose, Bld: 104 mg/dL — ABNORMAL HIGH (ref 65–99)
Potassium: 4.3 mmol/L (ref 3.5–5.3)
Sodium: 137 mmol/L (ref 135–146)
Total Bilirubin: 0.4 mg/dL (ref 0.2–1.2)
Total Protein: 7.5 g/dL (ref 6.1–8.1)

## 2020-03-13 LAB — LIPID PANEL
Cholesterol: 205 mg/dL — ABNORMAL HIGH (ref <200)
HDL: 78 mg/dL (ref >=40)
LDL Cholesterol (Calc): 107 mg/dL (calc) — ABNORMAL HIGH
Non-HDL Cholesterol (Calc): 127 mg/dL (calc) (ref <130)
Total CHOL/HDL Ratio: 2.6 (calc) (ref <5.0)
Triglycerides: 106 mg/dL (ref <150)

## 2020-03-13 LAB — MICROALBUMIN / CREATININE URINE RATIO
Creatinine, Urine: 139 mg/dL (ref 20–320)
Microalb Creat Ratio: 7 mcg/mg creat (ref <30)
Microalb, Ur: 1 mg/dL

## 2020-03-13 LAB — URINALYSIS, ROUTINE W REFLEX MICROSCOPIC
Bacteria, UA: NONE SEEN /HPF
Bilirubin Urine: NEGATIVE
Glucose, UA: NEGATIVE
Hyaline Cast: NONE SEEN /LPF
Ketones, ur: NEGATIVE
Nitrite: NEGATIVE
Protein, ur: NEGATIVE
RBC / HPF: NONE SEEN /HPF (ref 0–2)
Specific Gravity, Urine: 1.02 (ref 1.001–1.03)
Squamous Epithelial / HPF: NONE SEEN /HPF (ref <=5)
pH: 6.5 (ref 5.0–8.0)

## 2020-03-13 LAB — MAGNESIUM: Magnesium: 2.3 mg/dL (ref 1.5–2.5)

## 2020-03-13 LAB — URIC ACID: Uric Acid, Serum: 6.1 mg/dL (ref 4.0–8.0)

## 2020-03-13 LAB — INSULIN, RANDOM: Insulin: 53.1 u[IU]/mL — ABNORMAL HIGH

## 2020-03-13 LAB — HEMOGLOBIN A1C
Hgb A1c MFr Bld: 6 % of total Hgb — ABNORMAL HIGH (ref <5.7)
Mean Plasma Glucose: 126 mg/dL
eAG (mmol/L): 7 mmol/L

## 2020-03-13 LAB — PSA: PSA: 3.1 ng/mL (ref <=4.0)

## 2020-03-13 LAB — TSH: TSH: 2.01 mIU/L (ref 0.40–4.50)

## 2020-03-13 LAB — VITAMIN D 25 HYDROXY (VIT D DEFICIENCY, FRACTURES): Vit D, 25-Hydroxy: 70 ng/mL (ref 30–100)

## 2020-03-13 NOTE — Progress Notes (Signed)
========================================================== - Test results slightly outside the reference range are not unusual. If there is anything important, I will review this with you,  otherwise it is considered normal test values.  If you have further questions,  please do not hesitate to contact me at the office or via My Chart.  ==========================================================  -  CPK muscle enzyme is still a little elevated, but much better than 4 years ago  ==========================================================  -  PSA - Normal & OK   ==========================================================  -  Uric Acid / Gout test is Normal - Please continue Allopurinol  ==========================================================  -  Kidney functions (GFR) are decreased  and frequently is a sign of   Dehydration from not drinking enough fluids. So stop your  fluid  pill (Hydrochlorothiazide) which can contribute to Dehydration  -  Also, Recommend increase your fluid intake to 6 bottles (16 oz) of water or   fluid equivalent Daily.  - Recommend that you call office to schedule a Nurse visit in 3 weeks to   recheck blood test to assure kidney functions are improved.  ==========================================================  -  Chol = 205  is elevated  (Ideal or Goal is less than 180)  - So - Cholesterol is too high   - Recommend a stricter low cholesterol diet   - Cholesterol only comes from animal sources  - ie. meat, dairy, egg yolks  - Eat all the vegetables you want.  - Avoid meat, especially red meat - Beef AND Pork .  - Avoid cheese & dairy - milk & ice cream.     - Cheese is the most concentrated form of trans-fats which  is the worst thing to clog up our arteries.   - Veggie cheese is OK which can be found in the fresh  produce section at [employer] or [employer] or [employer] ==========================================================  -   A1c = 6.0% - 12 week average blood sugar is too high .  (Ideal or Goal is less than 5.7%   - So Your blood sugar and A1c are elevated.    -  Being diabetic has a  300% increased risk for heart attack,  stroke, cancer, and alzheimer- type vascular dementia.   -  It is very important that you work harder with diet by  avoiding all foods that are white except chicken,   fish & calliflower.  - Avoid white rice  (brown & wild rice is OK),   - Avoid white potatoes  (sweet potatoes in moderation is OK),   White bread or wheat bread or anything made out of   white flour like bagels, donuts, rolls, buns, biscuits, cakes,  - pastries, cookies, pizza crust, and pasta (made from  white flour & egg whites)   - vegetarian pasta or spinach or wheat pasta is OK.  - Multigrain breads like Arnold's, Pepperidge Farm or   multigrain sandwich thins or high fiber breads like   Eureka bread or "Dave's Killer" breads that are  4 to 5 grams fiber per slice !  are best.    Diet, exercise and weight loss can reverse and cure  diabetes in the early stages.   ==========================================================  -  Vitamin D = 70 - Excellent  ==========================================================  -  .All Else - CBC - Kidneys - Electrolytes - Liver - Magnesium & Thyroid    - all  Normal / OK ===========================================================  ===========================================================

## 2020-03-27 ENCOUNTER — Other Ambulatory Visit: Payer: Self-pay | Admitting: Internal Medicine

## 2020-03-27 DIAGNOSIS — I10 Essential (primary) hypertension: Secondary | ICD-10-CM

## 2020-04-09 ENCOUNTER — Other Ambulatory Visit: Payer: Self-pay | Admitting: Internal Medicine

## 2020-04-09 DIAGNOSIS — G4700 Insomnia, unspecified: Secondary | ICD-10-CM

## 2020-04-10 ENCOUNTER — Other Ambulatory Visit: Payer: Self-pay | Admitting: Internal Medicine

## 2020-04-10 ENCOUNTER — Other Ambulatory Visit: Payer: Self-pay

## 2020-04-10 ENCOUNTER — Encounter: Payer: Self-pay | Admitting: Internal Medicine

## 2020-04-10 ENCOUNTER — Ambulatory Visit (INDEPENDENT_AMBULATORY_CARE_PROVIDER_SITE_OTHER): Payer: Medicare Other | Admitting: Internal Medicine

## 2020-04-10 ENCOUNTER — Other Ambulatory Visit: Payer: Self-pay | Admitting: Adult Health Nurse Practitioner

## 2020-04-10 VITALS — BP 126/84 | HR 70 | Temp 97.2°F | Ht 71.0 in | Wt 182.6 lb

## 2020-04-10 DIAGNOSIS — Z79899 Other long term (current) drug therapy: Secondary | ICD-10-CM | POA: Diagnosis not present

## 2020-04-10 DIAGNOSIS — E782 Mixed hyperlipidemia: Secondary | ICD-10-CM

## 2020-04-10 DIAGNOSIS — I10 Essential (primary) hypertension: Secondary | ICD-10-CM

## 2020-04-10 DIAGNOSIS — N3281 Overactive bladder: Secondary | ICD-10-CM

## 2020-04-10 DIAGNOSIS — G4700 Insomnia, unspecified: Secondary | ICD-10-CM

## 2020-04-10 DIAGNOSIS — N179 Acute kidney failure, unspecified: Secondary | ICD-10-CM | POA: Diagnosis not present

## 2020-04-10 MED ORDER — OXYBUTYNIN CHLORIDE ER 10 MG PO TB24: 10.0000 mg | ORAL_TABLET | Freq: Every day | ORAL | 2 refills | Status: DC

## 2020-04-10 NOTE — Patient Instructions (Addendum)
Overactive Bladder, Adult  Overactive bladder is a condition in which a person has a sudden and frequent need to urinate. A person might also leak urine if he or she cannot get to the bathroom fast enough (urinary incontinence). Sometimes, symptoms can interfere with work or social activities. What are the causes? Overactive bladder is associated with poor nerve signals between your bladder and your brain. Your bladder may get the signal to empty before it is full. You may also have very sensitive muscles that make your bladder squeeze too soon. This condition may also be caused by other factors, such as:  Medical conditions: ? Urinary tract infection. ? Infection of nearby tissues. ? Prostate enlargement. ? Bladder stones, inflammation, or tumors. ? Diabetes. ? Muscle or nerve weakness, especially from these conditions:  A spinal cord injury.  Stroke.  Multiple sclerosis.  Parkinson's disease.  Other causes: ? Surgery on the uterus or urethra. ? Drinking too much caffeine or alcohol. ? Certain medicines, especially those that eliminate extra fluid in the body (diuretics). ? Constipation. What increases the risk? You may be at greater risk for overactive bladder if you:  Are an older adult.  Smoke.  Are going through menopause.  Have prostate problems.  Have a neurological disease, such as stroke, dementia, Parkinson's disease, or multiple sclerosis (MS).  Eat or drink alcohol, spicy food, caffeine, and other things that irritate the bladder.  Are overweight or obese. What are the signs or symptoms? Symptoms of this condition include a sudden, strong urge to urinate. Other symptoms include:  Leaking urine.  Urinating 8 or more times a day.  Waking up to urinate 2 or more times overnight. How is this diagnosed? This condition may be diagnosed based on:  Your symptoms and medical history.  A physical exam.  Blood or urine tests to check for possible causes,  such as infection. You may also need to see a health care provider who specializes in urinary tract problems. This is called a urologist. How is this treated? Treatment for overactive bladder depends on the cause of your condition and whether it is mild or severe. Treatment may include:  Bladder training, such as: ? Learning to control the urge to urinate by following a schedule to urinate at regular intervals. ? Doing Kegel exercises to strengthen the pelvic floor muscles that support your bladder.  Special devices, such as: ? Biofeedback. This uses sensors to help you become aware of your body's signals. ? Electrical stimulation. This uses electrodes placed inside the body (implanted) or outside the body. These electrodes send gentle pulses of electricity to strengthen the nerves or muscles that control the bladder. ? Women may use a plastic device, called a pessary, that fits into the vagina and supports the bladder.  Medicines, such as: ? Antibiotics to treat bladder infection. ? Antispasmodics to stop the bladder from releasing urine at the wrong time. ? Tricyclic antidepressants to relax bladder muscles. ? Injections of botulinum toxin type A directly into the bladder tissue to relax bladder muscles.  Surgery, such as: ? A device may be implanted to help manage the nerve signals that control urination. ? An electrode may be implanted to stimulate electrical signals in the bladder. ? A procedure may be done to change the shape of the bladder. This is done only in very severe cases. Follow these instructions at home: Eating and drinking  Make diet or lifestyle changes recommended by your health care provider. These may include: ? Drinking fluids   throughout the day and not only with meals. ? Cutting down on caffeine or alcohol. ? Eating a healthy and balanced diet to prevent constipation. This may include:  Choosing foods that are high in fiber, such as beans, whole grains, and  fresh fruits and vegetables.  Limiting foods that are high in fat and processed sugars, such as fried and sweet foods.   Lifestyle  Lose weight if needed.  Do not use any products that contain nicotine or tobacco. These include cigarettes, chewing tobacco, and vaping devices, such as e-cigarettes. If you need help quitting, ask your health care provider.   General instructions  Take over-the-counter and prescription medicines only as told by your health care provider.  If you were prescribed an antibiotic medicine, take it as told by your health care provider. Do not stop taking the antibiotic even if you start to feel better.  Use any implants or pessary as told by your health care provider.  If needed, wear pads to absorb urine leakage.  Keep a log to track how much and when you drink, and when you need to urinate. This will help your health care provider monitor your condition.  Keep all follow-up visits. This is important. Contact a health care provider if:  You have a fever or chills.  Your symptoms do not get better with treatment.  Your pain and discomfort get worse.  You have more frequent urges to urinate. Get help right away if:  You are not able to control your bladder. Summary  Overactive bladder refers to a condition in which a person has a sudden and frequent need to urinate.  Several conditions may lead to an overactive bladder.  Treatment for overactive bladder depends on the cause and severity of your condition.  Making lifestyle changes, doing Kegel exercises, keeping a log, and taking medicines can help with this condition. This information is not intended to replace advice given to you by your health care provider. Make sure you discuss any questions you have with your health care provider. Document Revised: 11/06/2019 Document Reviewed: 11/06/2019 Elsevier Patient Education  2021 [employer]  

## 2020-04-10 NOTE — Progress Notes (Signed)
   History of Present Illness:      This very nice 86 y.o. MBM with  HTN, HLD, Prediabetes, OSA /CPAP, Gout  and Vitamin D Deficiency returns for 1 month f/u to recheck renal functions Today patient is also c/o sx' urinary frequency & urgency suspect for OAB.  Has N x 1-2. Occas mild incontinence if delayed getting to a bathroom.       Creatinine had increased from 1.18 to 1.57 GFR decreased from 57 -->> 40  & patient was advised increasing fluid intake.   Medications   .  ezetimibe (10 MG tablet, Take 1 tablet daily. Marland Kitchen  gemfibrozil 600 MG t, TAKE 1 TABLET TWICE DAILY   .  NASACORT  nasal inhaler, Place 2 sprays into the nose at bedtime. Marland Kitchen  allopurinol  300 MG tablet, Take 1 tablet Daily to Prevent Gout .  VITAMIN D , Take 5,000 Units daily. .  clonazePAM 2 MG tablet, Take 1/2 - 1 tablet at Bedtime  .  tamsulosin  0.4 MG \, Take 1 capsule daily after supper. .  traZODone  150 MG tablet, Take  1 tablet  1 hour  before Bedtime   Problem list He has Hyperlipidemia, mixed; Essential hypertension; Idiopathic gout; Abnormal glucose; History of colonic polyps; Gastroesophageal reflux disease without esophagitis; Vitamin D deficiency; Medication management; OSA on CPAP; Constipation; and Statin myopathy on their problem list.   Observations/Objective:  BP 126/84   Pulse 70   Temp (!) 97.2 F (36.2 C)   Ht 5\' 11"  (1.803 m)   Wt 182 lb 9.6 oz (82.8 kg)   BMI 25.47 kg/m   HEENT - WNL. Neck - supple.  Chest - Clear equal BS. Cor - Nl HS. RRR w/o sig MGR. PP 1(+). No edema. MS- FROM w/o deformities.  Gait Nl. Neuro -  Nl w/o focal abnormalities.  Assessment and Plan:  1. Essential hypertension  - BASIC METABOLIC PANEL WITH GFR  2. AKI (acute kidney injury) (HCC)  - BASIC METABOLIC PANEL WITH GFR  3. OAB (overactive bladder)  - oxybutynin XL 10 MG ; Take 1 tablet  daily.  Dispense: 30 tablet; Refill: 2  4. Medication management  - BASIC METABOLIC PANEL WITH GFR  Follow Up  Instructions:      I discussed the assessment and treatment plan with the patient. The patient was provided an opportunity to ask questions and all were answered. The patient agreed with the plan and demonstrated an understanding of the instructions.       The patient was advised to call back or seek an in-person evaluation if the symptoms worsen or if the condition fails to improve as anticipated.   , MD

## 2020-04-11 LAB — BASIC METABOLIC PANEL WITH GFR
BUN: 17 mg/dL (ref 7–25)
CO2: 28 mmol/L (ref 20–32)
Calcium: 8.8 mg/dL (ref 8.6–10.3)
Chloride: 100 mmol/L (ref 98–110)
Creat: 0.96 mg/dL (ref 0.70–1.11)
GFR, Est African American: 84 mL/min/{1.73_m2} (ref >=60)
GFR, Est Non African American: 72 mL/min/{1.73_m2} (ref >=60)
Glucose, Bld: 82 mg/dL (ref 65–99)
Potassium: 4.3 mmol/L (ref 3.5–5.3)
Sodium: 135 mmol/L (ref 135–146)

## 2020-04-11 NOTE — Progress Notes (Signed)
========================================================== -   Test results slightly outside the reference range are not unusual. If there is anything important, I will review this with you,  otherwise it is considered normal test values.  If you have further questions,  please do not hesitate to contact me at the office or via My Chart.  ==========================================================  -  Repeat Blood test - Great !                                     - Kidney Functions Completely back to NORMAL !  ========================================================== ==========================================================

## 2020-04-13 ENCOUNTER — Encounter: Payer: Self-pay | Admitting: Internal Medicine

## 2020-04-29 DIAGNOSIS — G4733 Obstructive sleep apnea (adult) (pediatric): Secondary | ICD-10-CM | POA: Diagnosis not present

## 2020-05-06 ENCOUNTER — Telehealth: Payer: Self-pay | Admitting: *Deleted

## 2020-05-06 ENCOUNTER — Other Ambulatory Visit: Payer: Self-pay | Admitting: *Deleted

## 2020-05-06 DIAGNOSIS — G4733 Obstructive sleep apnea (adult) (pediatric): Secondary | ICD-10-CM | POA: Diagnosis not present

## 2020-05-06 NOTE — Telephone Encounter (Signed)
Spoke with patient regarding Atenolol refill. Patient states no refill needed or wanted at this time.

## 2020-05-08 ENCOUNTER — Ambulatory Visit (INDEPENDENT_AMBULATORY_CARE_PROVIDER_SITE_OTHER): Payer: Medicare Other | Admitting: Internal Medicine

## 2020-05-08 ENCOUNTER — Other Ambulatory Visit: Payer: Self-pay

## 2020-05-08 DIAGNOSIS — K047 Periapical abscess without sinus: Secondary | ICD-10-CM

## 2020-05-08 MED ORDER — DOXYCYCLINE HYCLATE 100 MG PO CAPS: ORAL_CAPSULE | ORAL | 0 refills | Status: DC

## 2020-05-08 NOTE — Progress Notes (Signed)
   History of Present Illness:  Patient presented today for bx of a lesion of his left ankle, but apparently he accidentally scrapped it off and it seems to have healed well w/o signs of residual abnormality.    Patient is noted to have a swollen Left cheek and has an appointment tomorrow to see a dentist for a painful left upper molar     I advised patient I sent in an Antibiotic (Doxycycline) to get started on today til he sees his dentist tomorrow.    Marinus Maw, MD  (No charge OV today)

## 2020-05-17 ENCOUNTER — Other Ambulatory Visit: Payer: Self-pay | Admitting: Internal Medicine

## 2020-05-20 ENCOUNTER — Other Ambulatory Visit: Payer: Self-pay | Admitting: *Deleted

## 2020-05-20 MED ORDER — ATENOLOL 100 MG PO TABS: ORAL_TABLET | ORAL | 1 refills | Status: DC

## 2020-06-11 ENCOUNTER — Other Ambulatory Visit: Payer: Self-pay | Admitting: Internal Medicine

## 2020-06-11 DIAGNOSIS — G4700 Insomnia, unspecified: Secondary | ICD-10-CM

## 2020-06-12 ENCOUNTER — Other Ambulatory Visit: Payer: Self-pay | Admitting: Internal Medicine

## 2020-06-12 DIAGNOSIS — E782 Mixed hyperlipidemia: Secondary | ICD-10-CM

## 2020-06-18 ENCOUNTER — Ambulatory Visit: Payer: Medicare Other | Admitting: Adult Health Nurse Practitioner

## 2020-06-21 DIAGNOSIS — G4733 Obstructive sleep apnea (adult) (pediatric): Secondary | ICD-10-CM | POA: Diagnosis not present

## 2020-06-27 ENCOUNTER — Ambulatory Visit (INDEPENDENT_AMBULATORY_CARE_PROVIDER_SITE_OTHER): Payer: Medicare Other | Admitting: Adult Health

## 2020-06-27 ENCOUNTER — Other Ambulatory Visit: Payer: Self-pay

## 2020-06-27 ENCOUNTER — Encounter: Payer: Self-pay | Admitting: Adult Health

## 2020-06-27 VITALS — BP 118/72 | HR 67 | Temp 97.5°F | Ht 71.0 in | Wt 179.0 lb

## 2020-06-27 DIAGNOSIS — K219 Gastro-esophageal reflux disease without esophagitis: Secondary | ICD-10-CM

## 2020-06-27 DIAGNOSIS — E782 Mixed hyperlipidemia: Secondary | ICD-10-CM

## 2020-06-27 DIAGNOSIS — G4700 Insomnia, unspecified: Secondary | ICD-10-CM | POA: Diagnosis not present

## 2020-06-27 DIAGNOSIS — Z6824 Body mass index (BMI) 24.0-24.9, adult: Secondary | ICD-10-CM | POA: Diagnosis not present

## 2020-06-27 DIAGNOSIS — R7309 Other abnormal glucose: Secondary | ICD-10-CM

## 2020-06-27 DIAGNOSIS — M1000 Idiopathic gout, unspecified site: Secondary | ICD-10-CM

## 2020-06-27 DIAGNOSIS — K5900 Constipation, unspecified: Secondary | ICD-10-CM

## 2020-06-27 DIAGNOSIS — E559 Vitamin D deficiency, unspecified: Secondary | ICD-10-CM | POA: Diagnosis not present

## 2020-06-27 DIAGNOSIS — T466X5A Adverse effect of antihyperlipidemic and antiarteriosclerotic drugs, initial encounter: Secondary | ICD-10-CM

## 2020-06-27 DIAGNOSIS — I10 Essential (primary) hypertension: Secondary | ICD-10-CM

## 2020-06-27 DIAGNOSIS — G4733 Obstructive sleep apnea (adult) (pediatric): Secondary | ICD-10-CM | POA: Diagnosis not present

## 2020-06-27 DIAGNOSIS — Z0001 Encounter for general adult medical examination with abnormal findings: Secondary | ICD-10-CM | POA: Diagnosis not present

## 2020-06-27 DIAGNOSIS — Z0000 Encounter for general adult medical examination without abnormal findings: Secondary | ICD-10-CM

## 2020-06-27 DIAGNOSIS — Z79899 Other long term (current) drug therapy: Secondary | ICD-10-CM

## 2020-06-27 DIAGNOSIS — G720 Drug-induced myopathy: Secondary | ICD-10-CM | POA: Diagnosis not present

## 2020-06-27 DIAGNOSIS — Z9989 Dependence on other enabling machines and devices: Secondary | ICD-10-CM | POA: Diagnosis not present

## 2020-06-27 DIAGNOSIS — R6889 Other general symptoms and signs: Secondary | ICD-10-CM | POA: Diagnosis not present

## 2020-06-27 MED ORDER — FAMOTIDINE 20 MG PO TABS: 20.0000 mg | ORAL_TABLET | Freq: Two times a day (BID) | ORAL | 1 refills | Status: DC | PRN

## 2020-06-27 NOTE — Patient Instructions (Addendum)
Jeremy Boone , Thank you for taking time to come for your Medicare Wellness Visit. I appreciate your ongoing commitment to your health goals. Please review the following plan we discussed and let me know if I can assist you in the future.   These are the goals we discussed: Goals    . Exercise 150 min/wk Moderate Activity     Aim to walk 30 min, 5+ days     . HEMOGLOBIN A1C < 5.7    . LDL CALC < 130       This is a list of the screening recommended for you and due dates:  Health Maintenance  Topic Date Due  . Flu Shot  09/30/2020  . Tetanus Vaccine  10/24/2024  . COVID-19 Vaccine  Completed  . Pneumonia vaccines  Completed  . HPV Vaccine  Aged Out     Try raising head of bed 2 inches   Avoid lying down for 4 hours after eating, avoid very large meals  Try famotidine twice daily before breakfast and bedtime for 2 weeks, then as needed  Follow up if not improving     Gastroesophageal Reflux Disease, Adult Gastroesophageal reflux (GER) happens when acid from the stomach flows up into the tube that connects the mouth and the stomach (esophagus). Normally, food travels down the esophagus and stays in the stomach to be digested. However, when a person has GER, food and stomach acid sometimes move back up into the esophagus. If this becomes a more serious problem, the person may be diagnosed with a disease called gastroesophageal reflux disease (GERD). GERD occurs when the reflux:  Happens often.  Causes frequent or severe symptoms.  Causes problems such as damage to the esophagus. When stomach acid comes in contact with the esophagus, the acid may cause inflammation in the esophagus. Over time, GERD may create small holes (ulcers) in the lining of the esophagus. What are the causes? This condition is caused by a problem with the muscle between the esophagus and the stomach (lower esophageal sphincter, or LES). Normally, the LES muscle closes after food passes through the  esophagus to the stomach. When the LES is weakened or abnormal, it does not close properly, and that allows food and stomach acid to go back up into the esophagus. The LES can be weakened by certain dietary substances, medicines, and medical conditions, including:  Tobacco use.  Pregnancy.  Having a hiatal hernia.  Alcohol use.  Certain foods and beverages, such as coffee, chocolate, onions, and peppermint. What increases the risk? You are more likely to develop this condition if you:  Have an increased body weight.  Have a connective tissue disorder.  Take NSAIDs, such as ibuprofen. What are the signs or symptoms? Symptoms of this condition include:  Heartburn.  Difficult or painful swallowing and the feeling of having a lump in the throat.  A bitter taste in the mouth.  Bad breath and having a large amount of saliva.  Having an upset or bloated stomach and belching.  Chest pain. Different conditions can cause chest pain. Make sure you see your health care provider if you experience chest pain.  Shortness of breath or wheezing.  Ongoing (chronic) cough or a nighttime cough.  Wearing away of tooth enamel.  Weight loss. How is this diagnosed? This condition may be diagnosed based on a medical history and a physical exam. To determine if you have mild or severe GERD, your health care provider may also  monitor how you respond to treatment. You may also have tests, including:  A test to examine your stomach and esophagus with a small camera (endoscopy).  A test that measures the acidity level in your esophagus.  A test that measures how much pressure is on your esophagus.  A barium swallow or modified barium swallow test to show the shape, size, and functioning of your esophagus. How is this treated? Treatment for this condition may vary depending on how severe your symptoms are. Your health care provider may recommend:  Changes to your  diet.  Medicine.  Surgery. The goal of treatment is to help relieve your symptoms and to prevent complications. Follow these instructions at home: Eating and drinking  Follow a diet as recommended by your health care provider. This may involve avoiding foods and drinks such as: ? Coffee and tea, with or without caffeine. ? Drinks that contain alcohol. ? Energy drinks and sports drinks. ? Carbonated drinks or sodas. ? Chocolate and cocoa. ? Peppermint and mint flavorings. ? Garlic and onions. ? Horseradish. ? Spicy and acidic foods, including peppers, chili powder, curry powder, vinegar, hot sauces, and barbecue sauce. ? Citrus fruit juices and citrus fruits, such as oranges, lemons, and limes. ? Tomato-based foods, such as red sauce, chili, salsa, and pizza with red sauce. ? Fried and fatty foods, such as donuts, french fries, potato chips, and high-fat dressings. ? High-fat meats, such as hot dogs and fatty cuts of red and white meats, such as rib eye steak, sausage, ham, and bacon. ? High-fat dairy items, such as whole milk, butter, and cream cheese.  Eat small, frequent meals instead of large meals.  Avoid drinking large amounts of liquid with your meals.  Avoid eating meals during the 2-3 hours before bedtime.  Avoid lying down right after you eat.  Do not exercise right after you eat.   Lifestyle  Do not use any products that contain nicotine or tobacco. These products include cigarettes, chewing tobacco, and vaping devices, such as e-cigarettes. If you need help quitting, ask your health care provider.  Try to reduce your stress by using methods such as yoga or meditation. If you need help reducing stress, ask your health care provider.  If you are overweight, reduce your weight to an amount that is healthy for you. Ask your health care provider for guidance about a safe weight loss goal.   General instructions  Pay attention to any changes in your symptoms.  Take  over-the-counter and prescription medicines only as told by your health care provider. Do not take aspirin, ibuprofen, or other NSAIDs unless your health care provider told you to take these medicines.  Wear loose-fitting clothing. Do not wear anything tight around your waist that causes pressure on your abdomen.  Raise (elevate) the head of your bed about 6 inches (15 cm). You can use a wedge to do this.  Avoid bending over if this makes your symptoms worse.  Keep all follow-up visits. This is important. Contact a health care provider if:  You have: ? New symptoms. ? Unexplained weight loss. ? Difficulty swallowing or it hurts to swallow. ? Wheezing or a persistent cough. ? A hoarse voice.  Your symptoms do not improve with treatment. Get help right away if:  You have sudden pain in your arms, neck, jaw, teeth, or back.  You suddenly feel sweaty, dizzy, or light-headed.  You have chest pain or shortness of breath.  You vomit and the vomit is  green, yellow, or black, or it looks like blood or coffee grounds.  You faint.  You have stool that is red, bloody, or black.  You cannot swallow, drink, or eat. These symptoms may represent a serious problem that is an emergency. Do not wait to see if the symptoms will go away. Get medical help right away. Call your local emergency services (911 in the [place] Do not drive yourself to the hospital. Summary  Gastroesophageal reflux happens when acid from the stomach flows up into the esophagus. GERD is a disease in which the reflux happens often, causes frequent or severe symptoms, or causes problems such as damage to the esophagus.  Treatment for this condition may vary depending on how severe your symptoms are. Your health care provider may recommend diet and lifestyle changes, medicine, or surgery.  Contact a health care provider if you have new or worsening symptoms.  Take over-the-counter and prescription medicines only as told by  your health care provider. Do not take aspirin, ibuprofen, or other NSAIDs unless your health care provider told you to do so.  Keep all follow-up visits as told by your health care provider. This is important. This information is not intended to replace advice given to you by your health care provider. Make sure you discuss any questions you have with your health care provider. Document Revised: 08/28/2019 Document Reviewed: 08/28/2019 Elsevier Patient Education  2021 [employer].

## 2020-06-27 NOTE — Progress Notes (Signed)
MEDICARE ANNUAL WELLNESS VISIT AND FOLLOW UP Assessment:   Encounter for Medicare annual wellness exam 1 year  Essential hypertension - continue medications, DASH diet, exercise and monitor at home. Call if greater than 130/80.  -     CBC with Differential/Platelet -     COMPLETE METABOLIC PANEL WITH GFR -     TSH  OSA on CPAP Continue CPAP  Mixed hyperlipidemia, statin intolerant check lipids decrease fatty foods increase activity.  -     Lipid panel  Abnormal glucose Discussed disease progression and risks Discussed diet/exercise, weight management and risk modification -     Hemoglobin A1c  Medication management -     Magnesium  Vitamin D deficiency Continue supplement  History of colonic polyps Declines follow up due to age  Idiopathic gout, unspecified chronicity, unspecified site Gout- recheck Uric acid as needed, Diet discussed, continue medications.  Gastroesophageal reflux disease with esophagitis Initiated medication for new reflux related symptoms: famotidine 29 mg BID Discussed diet, avoiding triggers and other lifestyle changes Follow up if not improving  Insomnia Continue trazodone daily, klonopin PRN, doing well limiting use Monitor closely     Future Appointments  Date Time Provider Department Center  09/23/2020  2:30 PM [name], MD [facility] None  03/27/2021 10:00 AM [name], MD [facility] None  06/30/2021  2:30 PM Judd Gaudier, NP [facility] None     Plan:   During the course of the visit the patient was educated and counseled about appropriate screening and preventive services including:    Pneumococcal vaccine   Influenza vaccine  Td vaccine  Screening electrocardiogram  Colorectal cancer screening  Diabetes screening  Glaucoma screening  Nutrition counseling    Subjective:  Jeremy Boone is a AA 86 y.o. male who presents for Medicare Annual Wellness Visit and 3 month follow up for HTN,  hyperlipidemia, prediabetes, and vitamin D Def.   Wife has dementia, he is primary caregiver. Goes to [employer] during the day.   He has CPAP machine, reports is wearing without difficulty, endorses restorative sleep. He is on trazodone daily and klonopin as needed for insomnia, will take occasionally if unable to fall back asleep after waking up, takes 1/2 tab only.   He reports since last week having increased acid reflux, has previously taken tums only, not currently   He was having urinary urgency and mild incontinence, reports improved with oxybutynin.   BMI is Body mass index is 24.97 kg/m., he has been working on diet and generally not intentionally exercising, active in home and yard, but could walk [place].  Wt Readings from Last 3 Encounters:  06/27/20 179 lb (81.2 kg)  04/13/20 182 lb 9.6 oz (82.8 kg)  03/12/20 178 lb 3.2 oz (80.8 kg)   His blood pressure has been controlled at home, today their BP is BP: 118/72, recently off of all medications.  He does workout. He denies chest pain, dyspnea, dizziness.   He is on cholesterol medication, lopid 1200 once daily due to intolerance of statins due to elevated CPK/aldolase and denies myalgias. His cholesterol is not at goal. The cholesterol last visit was:   Lab Results  Component Value Date   CHOL 205 (H) 03/12/2020   HDL 78 03/12/2020   LDLCALC 107 (H) 03/12/2020   TRIG 106 03/12/2020   CHOLHDL 2.6 03/12/2020   He has been working on diet and exercise for prediabetes, and denies paresthesia of the feet, polydipsia, polyuria and visual disturbances. Last A1C in the  office was:  Lab Results  Component Value Date   HGBA1C 6.0 (H) 03/12/2020   Patient is on Vitamin D supplement.   Lab Results  Component Value Date   VD25OH 19 03/12/2020   He has ED and uses viagra PRN.  Patient is on allopurinol (150 mg only) for gout and does not report a recent flare. Lab Results  Component Value Date   LABURIC 6.1  03/12/2020      Medication Review: Current Outpatient Medications on File Prior to Visit  Medication Sig Dispense Refill  . allopurinol (ZYLOPRIM) 300 MG tablet TAKE 1 TABLET BY MOUTH  DAILY TO PREVENT GOUT 90 tablet 3  . Cholecalciferol (VITAMIN D PO) Take 5,000 Int'l Units by mouth daily.    . clonazePAM (KLONOPIN) 2 MG tablet Take  1/2 - 1 tablet  at Bedtime  ONLY  if needed for Sleep & limit to 5 days /week to avoid Addiction & Dementia 30 tablet 0  . ezetimibe (ZETIA) 10 MG tablet TAKE 1 TABLET BY MOUTH  DAILY FOR CHOLESTEROL 90 tablet 3  . gemfibrozil (LOPID) 600 MG tablet TAKE 1 TABLET TWICE DAILY  WITH MEALS FOR CHOLESTEROL 180 tablet 3  . oxybutynin (DITROPAN XL) 10 MG 24 hr tablet Take 1 tablet (10 mg total) by mouth daily. 30 tablet 2  . traZODone (DESYREL) 150 MG tablet TAKE 1 TABLET BY MOUTH 1  HOUR BEFORE BEDTIME AS  NEEDED FOR SLEEP 90 tablet 3  . triamcinolone (NASACORT) 55 MCG/ACT AERO nasal inhaler Place 2 sprays into the nose at bedtime. 1 each 3  . atenolol (TENORMIN) 100 MG tablet Take 1 tablet daily for blood pressure. (Patient not taking: Reported on 06/27/2020) 90 tablet 1  . doxycycline (VIBRAMYCIN) 100 MG capsule Take 2 capsules  Now with food,  then take 1 capsule 2 x /day with Meals for Dental  Infection 60 capsule 0  . hydrochlorothiazide (HYDRODIURIL) 25 MG tablet TAKE 1 TABLET DAILY FOR  BLOOD PRESSURE AND FLUID  RETENTION, ANKLE SWELLING (Patient not taking: Reported on 06/27/2020) 90 tablet 3  . tamsulosin (FLOMAX) 0.4 MG CAPS capsule Take 1 capsule (0.4 mg total) by mouth daily after supper. (Patient not taking: Reported on 06/27/2020) 30 capsule 2   No current facility-administered medications on file prior to visit.    Current Problems (verified) Patient Active Problem List   Diagnosis Date Noted  . Statin myopathy 11/21/2019  . Constipation 12/15/2017  . OSA on CPAP 04/19/2015  . Vitamin D deficiency 09/14/2013  . Medication management 09/14/2013  .  History of colonic polyps 08/02/2013  . Gastroesophageal reflux disease without esophagitis 08/02/2013  . Hyperlipidemia, mixed   . Essential hypertension   . Idiopathic gout   . Abnormal glucose     Screening Tests Immunization History  Administered Date(s) Administered  . Influenza, High Dose Seasonal PF 12/18/2013, 10/28/2015, 12/10/2016, 01/26/2018, 11/10/2018  . Influenza,inj,Quad PF,6+ Mos 03/07/2013  . Moderna Sars-Covid-2 Vaccination 03/14/2019, 04/11/2019  . Pneumococcal Conjugate-13 03/28/2014  . Pneumococcal-Unspecified 03/03/2007  . Td 03/02/2004  . Tdap 10/25/2014  . Zoster 08/26/2016    Preventative care: Last colonoscopy: 10/13/07 polyps, diverticulosis DECLINES DUE TO AGE EGD: 2015 Dr. Arlyce Dice  Prior vaccinations: TD or Tdap: 2016 Influenza: 2020, skipped, get next season Pneumococcal: 2009 Prevnar 13: 2016 Shingles/Zostavax: 2018 Covid 19: 3/3, 2021  Names of Other Physician/Practitioners you currently use: 1. [facility] here for primary care 2. Last eye: Dr. ?, 2021 3. Last dental, 2022,  has scheduled next month   Patient Care Team: Lucky CowboyMcKeown, William, MD as PCP - General (Internal Medicine) Teresa CoombsEpes, Charles, MD as Consulting Physician (Ophthalmology) Aplington, Illene LabradorJames P, MD (Inactive) as Consulting Physician (Orthopedic Surgery) Louis MeckelKaplan, Robert D, MD (Inactive) as Consulting Physician (Gastroenterology) Carilyn GoodpastureJennifer Willard as Physician Assistant (Sleep Medicine)   History reviewed: allergies, current medications, past family history, past medical history, past social history, past surgical history and problem list  Allergies Allergies  Allergen Reactions  . Ace Inhibitors Cough  . Lipitor [Atorvastatin]     Elevates CPK and Aldolase    SURGICAL HISTORY He  has a past surgical history that includes Carpal tunnel release (Left, 2007); Rotator cuff repair (Left, 2000); Other surgical history (2007); and Cardiac Event  Monitor (May-June 2017). FAMILY HISTORY His family history includes Hypertension in his mother; Seizures in his sister; Stroke in his father. SOCIAL HISTORY He  reports that he has never smoked. He has never used smokeless tobacco. He reports current alcohol use of about 15.0 standard drinks of alcohol per week. He reports that he does not use drugs.  MEDICARE WELLNESS OBJECTIVES: Physical activity:   Cardiac risk factors:   Depression/mood screen:   Depression screen [facility] 04/13/2020  Decreased Interest 0  Down, Depressed, Hopeless 0  PHQ - 2 Score 0    ADLs:  In your present state of health, do you have any difficulty performing the following activities: 03/11/2020 08/20/2019  Hearing? N N  Vision? N N  Difficulty concentrating or making decisions? N N  Walking or climbing stairs? N N  Dressing or bathing? N N  Doing errands, shopping? N N  Some recent data might be hidden     Cognitive Testing  Alert? Yes  Normal Appearance?Yes  Oriented to person? Yes  Place? Yes   Time? Yes  Recall of three objects?  Yes  Can perform simple calculations? Yes  Displays appropriate judgment?Yes  Can read the correct time from a watch face?Yes  EOL planning:     Objective:   Blood pressure 118/72, pulse 67, temperature (!) 97.5 F (36.4 C), height 5\' 11"  (1.803 m), weight 179 lb (81.2 kg), SpO2 97 %. Body mass index is 24.97 kg/m.  General appearance: alert, no distress, WD/WN, male HEENT: normocephalic, sclerae anicteric, TMs pearly, nares patent, no discharge or erythema, pharynx normal Oral cavity: MMM, no lesions Neck: supple, no lymphadenopathy, no thyromegaly, no masses Heart: RRR, normal S1, S2, 2/6 rumbling early systolic murmur without radiation Lungs: CTA bilaterally, no wheezes, rhonchi, or rales Abdomen: +bs, soft, mild epigastric tenderness, non distended, no masses, no hepatomegaly, no splenomegaly Musculoskeletal: nontender, no swelling, no obvious  deformity Extremities: no edema, no cyanosis, no clubbing Pulses: 2+ symmetric, upper and lower extremities, normal cap refill Neurological: alert, oriented x 3, CN2-12 intact, strength normal upper extremities and lower extremities, sensation normal throughout except left leg decrease sensation in L5/S1 distribution,, DTRs 2+ throughout, no cerebellar signs, gait normal Psychiatric: normal affect, behavior normal, pleasant   Medicare Attestation I have personally reviewed: The patient's medical and social history Their use of alcohol, tobacco or illicit drugs Their current medications and supplements The patient's functional ability including ADLs,fall risks, home safety risks, cognitive, and hearing and visual impairment Diet and physical activities Evidence for depression or mood disorders  The patient's weight, height, BMI, and visual acuity have been recorded in the chart.  I have made referrals, counseling, and provided education to the patient based on review of the above and I  have provided the patient with a written personalized care plan for preventive services.     Dan Maker, NP   06/27/2020

## 2020-06-28 LAB — TSH: TSH: 2.14 mIU/L (ref 0.40–4.50)

## 2020-06-28 LAB — CBC WITH DIFFERENTIAL/PLATELET
Absolute Monocytes: 460 cells/uL (ref 200–950)
Basophils Absolute: 30 cells/uL (ref 0–200)
Basophils Relative: 0.6 %
Eosinophils Absolute: 360 cells/uL (ref 15–500)
Eosinophils Relative: 7.2 %
HCT: 43.8 % (ref 38.5–50.0)
Hemoglobin: 14.6 g/dL (ref 13.2–17.1)
Lymphs Abs: 2255 cells/uL (ref 850–3900)
MCH: 30.7 pg (ref 27.0–33.0)
MCHC: 33.3 g/dL (ref 32.0–36.0)
MCV: 92.2 fL (ref 80.0–100.0)
MPV: 9.6 fL (ref 7.5–12.5)
Monocytes Relative: 9.2 %
Neutro Abs: 1895 cells/uL (ref 1500–7800)
Neutrophils Relative %: 37.9 %
Platelets: 290 10*3/uL (ref 140–400)
RBC: 4.75 10*6/uL (ref 4.20–5.80)
RDW: 12.9 % (ref 11.0–15.0)
Total Lymphocyte: 45.1 %
WBC: 5 10*3/uL (ref 3.8–10.8)

## 2020-06-28 LAB — COMPLETE METABOLIC PANEL WITH GFR
AG Ratio: 1.3 (calc) (ref 1.0–2.5)
ALT: 9 U/L (ref 9–46)
AST: 21 U/L (ref 10–35)
Albumin: 4.3 g/dL (ref 3.6–5.1)
Alkaline phosphatase (APISO): 61 U/L (ref 35–144)
BUN: 18 mg/dL (ref 7–25)
CO2: 28 mmol/L (ref 20–32)
Calcium: 9.5 mg/dL (ref 8.6–10.3)
Chloride: 101 mmol/L (ref 98–110)
Creat: 1 mg/dL (ref 0.70–1.11)
GFR, Est African American: 80 mL/min/{1.73_m2} (ref >=60)
GFR, Est Non African American: 69 mL/min/{1.73_m2} (ref >=60)
Globulin: 3.3 g/dL (calc) (ref 1.9–3.7)
Glucose, Bld: 102 mg/dL — ABNORMAL HIGH (ref 65–99)
Potassium: 4.1 mmol/L (ref 3.5–5.3)
Sodium: 136 mmol/L (ref 135–146)
Total Bilirubin: 0.3 mg/dL (ref 0.2–1.2)
Total Protein: 7.6 g/dL (ref 6.1–8.1)

## 2020-06-28 LAB — LIPID PANEL
Cholesterol: 174 mg/dL (ref <200)
HDL: 71 mg/dL (ref >=40)
LDL Cholesterol (Calc): 89 mg/dL (calc)
Non-HDL Cholesterol (Calc): 103 mg/dL (calc) (ref <130)
Total CHOL/HDL Ratio: 2.5 (calc) (ref <5.0)
Triglycerides: 48 mg/dL (ref <150)

## 2020-06-28 LAB — MAGNESIUM: Magnesium: 2.1 mg/dL (ref 1.5–2.5)

## 2020-07-01 ENCOUNTER — Ambulatory Visit: Payer: Medicare Other | Admitting: Adult Health

## 2020-07-03 DIAGNOSIS — G4733 Obstructive sleep apnea (adult) (pediatric): Secondary | ICD-10-CM | POA: Diagnosis not present

## 2020-07-21 ENCOUNTER — Other Ambulatory Visit: Payer: Self-pay | Admitting: Adult Health

## 2020-08-05 ENCOUNTER — Other Ambulatory Visit: Payer: Self-pay | Admitting: Internal Medicine

## 2020-08-29 ENCOUNTER — Other Ambulatory Visit: Payer: Self-pay | Admitting: Internal Medicine

## 2020-08-29 DIAGNOSIS — G4700 Insomnia, unspecified: Secondary | ICD-10-CM

## 2020-09-23 ENCOUNTER — Encounter: Payer: Self-pay | Admitting: Internal Medicine

## 2020-09-23 ENCOUNTER — Ambulatory Visit (INDEPENDENT_AMBULATORY_CARE_PROVIDER_SITE_OTHER): Payer: Medicare Other | Admitting: Internal Medicine

## 2020-09-23 ENCOUNTER — Other Ambulatory Visit: Payer: Self-pay

## 2020-09-23 VITALS — BP 140/80 | HR 66 | Temp 97.6°F | Resp 16 | Ht 71.5 in | Wt 180.0 lb

## 2020-09-23 DIAGNOSIS — M1000 Idiopathic gout, unspecified site: Secondary | ICD-10-CM | POA: Diagnosis not present

## 2020-09-23 DIAGNOSIS — R7309 Other abnormal glucose: Secondary | ICD-10-CM

## 2020-09-23 DIAGNOSIS — Z79899 Other long term (current) drug therapy: Secondary | ICD-10-CM | POA: Diagnosis not present

## 2020-09-23 DIAGNOSIS — E782 Mixed hyperlipidemia: Secondary | ICD-10-CM

## 2020-09-23 DIAGNOSIS — R0989 Other specified symptoms and signs involving the circulatory and respiratory systems: Secondary | ICD-10-CM | POA: Diagnosis not present

## 2020-09-23 DIAGNOSIS — E559 Vitamin D deficiency, unspecified: Secondary | ICD-10-CM | POA: Diagnosis not present

## 2020-09-23 NOTE — Progress Notes (Signed)
Future Appointments  Date Time Provider Department Center  03/27/2021 10:00 AM Jeremy Cowboy, Jeremy Boone [facility] None  06/30/2021  2:30 PM Jeremy Gaudier, Jeremy Boone [facility] None    History of Present Illness:       This very nice 86 y.o. MBM presents for 6  month follow up with HTN, HLD, Pre-Diabetes and Vitamin D Deficiency.  Patient has Gout controlled on his Allopurinol. He also is on CPAP for OSA with improved Sleep Hygiene. Patient also has oBPH / LUTS improved with Tamsulosin.        Patient is treated for HTN (1998) & BP has been controlled at home. Today's BP  is at goal -  140/80. Patient has had no complaints of any cardiac type chest pain, palpitations, dyspnea / orthopnea / PND, dizziness, claudication, or dependent edema.       Hyperlipidemia is controlled with diet & meds. Patient denies myalgias or other med SE's. Last Lipids were at goal:  Lab Results  Component Value Date   CHOL 174 06/27/2020   HDL 71 06/27/2020   LDLCALC 89 06/27/2020   TRIG 48 06/27/2020   CHOLHDL 2.5 06/27/2020     Also, the patient has history of PreDiabetes  (A1c 6.3% /2011) and has had no symptoms of reactive hypoglycemia, diabetic polys, paresthesias or visual blurring.  Last A1c was not at goal:  Lab Results  Component Value Date   HGBA1C 6.0 (H) 03/12/2020        Further, the patient also has history of Vitamin D Deficiency  ("25" /2008) and supplements vitamin D without any suspected side-effects. Last vitamin D was at goal:  Lab Results  Component Value Date   VD25OH 70 03/12/2020    Allergies  Allergen Reactions   Ace Inhibitors Cough   Lipitor [Atorvastatin]     Elevates CPK and Aldolase    PMHx:   Past Medical History:  Diagnosis Date   Gout    Hyperlipidemia    Hypertension    Prediabetes     Immunization History  Administered Date(s) Administered   Influenza, High Dose  12/18/2013, 10/28/2015, 12/10/2016, 01/26/2018, 11/10/2018    Influenza,inj,Quad  03/07/2013   Moderna Sars-Covid-2 Vacci 03/14/2019, 04/11/2019, 02/04/2020   Pneumococcal -13 03/28/2014   Pneumococcal-23 03/03/2007   Td 03/02/2004   Tdap 10/25/2014   Zoster, Live 08/26/2016     Past Surgical History:  Procedure Laterality Date   Cardiac Event Monitor  May-June 2017   Sinus rhythm with average heart rate 74.2 - no notable arrhythmias. Minimal PVCs. No PACs.   CARPAL TUNNEL RELEASE Left 2007   CATARACT EXTRACTION, BILATERAL     OTHER SURGICAL HISTORY  2007   Negative biopsy tonsil mass   ROTATOR CUFF REPAIR Left 2000    FHx:    Reviewed / unchanged  SHx:    Reviewed / unchanged   Systems Review:  Constitutional: Denies fever, chills, wt changes, headaches, insomnia, fatigue, night sweats, change in appetite. Eyes: Denies redness, blurred vision, diplopia, discharge, itchy, watery eyes.  ENT: Denies discharge, congestion, post nasal drip, epistaxis, sore throat, earache, hearing loss, dental pain, tinnitus, vertigo, sinus pain, snoring.  CV: Denies chest pain, palpitations, irregular heartbeat, syncope, dyspnea, diaphoresis, orthopnea, PND, claudication or edema. Respiratory: denies cough, dyspnea, DOE, pleurisy, hoarseness, laryngitis, wheezing.  Gastrointestinal: Denies dysphagia, odynophagia, heartburn, reflux, water brash, abdominal pain or cramps, nausea, vomiting, bloating, diarrhea, constipation, hematemesis, melena, hematochezia  or hemorrhoids. Genitourinary: Denies dysuria, frequency, urgency, nocturia, hesitancy,  discharge, hematuria or flank pain. Musculoskeletal: Denies arthralgias, myalgias, stiffness, jt. swelling, pain, limping or strain/sprain.  Skin: Denies pruritus, rash, hives, warts, acne, eczema or change in skin lesion(s). Neuro: No weakness, tremor, incoordination, spasms, paresthesia or pain. Psychiatric: Denies confusion, memory loss or sensory loss. Endo: Denies change in weight, skin or hair change.  Heme/Lymph:  No excessive bleeding, bruising or enlarged lymph nodes.  Physical Exam  BP 140/80   Pulse 66   Temp 97.6 F (36.4 C)   Resp 16   Ht 5' 11.5" (1.816 m)   Wt 180 lb (81.6 kg)   SpO2 99%   BMI 24.76 kg/m   Appears  well nourished, well groomed  and in no distress.  Eyes: PERRLA, EOMs, conjunctiva no swelling or erythema. Sinuses: No frontal/maxillary tenderness ENT/Mouth: EAC's clear, TM's nl w/o erythema, bulging. Nares clear w/o erythema, swelling, exudates. Oropharynx clear without erythema or exudates. Oral hygiene is good. Tongue normal, non obstructing. Hearing intact.  Neck: Supple. Thyroid not palpable. Car 2+/2+ without bruits, nodes or JVD. Chest: Respirations nl with BS clear & equal w/o rales, rhonchi, wheezing or stridor.  Cor: Heart sounds normal w/ regular rate and rhythm without sig. murmurs, gallops, clicks or rubs. Peripheral pulses normal and equal  without edema.  Abdomen: Soft & bowel sounds normal. Non-tender w/o guarding, rebound, hernias, masses or organomegaly.  Lymphatics: Unremarkable.  Musculoskeletal: Full ROM all peripheral extremities, joint stability, 5/5 strength and normal gait.  Skin: Warm, dry without exposed rashes, lesions or ecchymosis apparent.  Neuro: Cranial nerves intact, reflexes equal bilaterally. Sensory-motor testing grossly intact. Tendon reflexes grossly intact.  Pysch: Alert & oriented x 3.  Insight and judgement nl & appropriate. No ideations.  Assessment and Plan:  1. Labile hypertension  - Continue medication, monitor blood pressure at home.  - Continue DASH diet.  Reminder to go to the ER if any CP,  SOB, nausea, dizziness, severe HA, changes vision/speech.    - CBC with Differential/Platelet - COMPLETE METABOLIC PANEL WITH GFR  - Magnesium - TSH  2. Hyperlipidemia, mixed  - Continue diet/meds, exercise,& lifestyle modifications.  - Continue monitor periodic cholesterol/liver & renal functions    - Lipid panel -  TSH  3. Abnormal glucose  - Hemoglobin A1c - Insulin, random  4. Vitamin D deficiency  - Continue diet, exercise  - Lifestyle modifications.  - Monitor appropriate labs. - Continue supplementation.    -  - VITAMIN D 25 Hydroxy  5. Idiopathic gout Uric acid  6. Medication management  - CBC with Differential/Platelet - COMPLETE METABOLIC PANEL WITH GFR - Magnesium - Lipid panel - TSH - Hemoglobin A1c - Insulin, random - VITAMIN D 25 Hydroxy  - Uric acid        Discussed  regular exercise, BP monitoring, weight control to achieve/maintain BMI less than 25 and discussed med and SE's. Recommended labs to assess and monitor clinical status with further disposition pending results of labs.  I discussed the assessment and treatment plan with the patient. The patient was provided an opportunity to ask questions and all were answered. The patient agreed with the plan and demonstrated an understanding of the instructions.  I provided over 30 minutes of exam, counseling, chart review and  complex critical decision making.        The patient was advised to call back or seek an in-person evaluation if the symptoms worsen or if the condition fails to improve as anticipated.   Jeremy Maw, Jeremy Boone

## 2020-09-23 NOTE — Patient Instructions (Signed)

## 2020-09-24 DIAGNOSIS — G4733 Obstructive sleep apnea (adult) (pediatric): Secondary | ICD-10-CM | POA: Diagnosis not present

## 2020-09-24 LAB — CBC WITH DIFFERENTIAL/PLATELET
Absolute Monocytes: 541 cells/uL (ref 200–950)
Basophils Absolute: 41 cells/uL (ref 0–200)
Basophils Relative: 0.8 %
Eosinophils Absolute: 439 cells/uL (ref 15–500)
Eosinophils Relative: 8.6 %
HCT: 43.6 % (ref 38.5–50.0)
Hemoglobin: 14.1 g/dL (ref 13.2–17.1)
Lymphs Abs: 2545 cells/uL (ref 850–3900)
MCH: 29.7 pg (ref 27.0–33.0)
MCHC: 32.3 g/dL (ref 32.0–36.0)
MCV: 91.8 fL (ref 80.0–100.0)
MPV: 9.9 fL (ref 7.5–12.5)
Monocytes Relative: 10.6 %
Neutro Abs: 1535 cells/uL (ref 1500–7800)
Neutrophils Relative %: 30.1 %
Platelets: 249 10*3/uL (ref 140–400)
RBC: 4.75 10*6/uL (ref 4.20–5.80)
RDW: 13 % (ref 11.0–15.0)
Total Lymphocyte: 49.9 %
WBC: 5.1 10*3/uL (ref 3.8–10.8)

## 2020-09-24 LAB — COMPLETE METABOLIC PANEL WITH GFR
AG Ratio: 1.5 (calc) (ref 1.0–2.5)
ALT: 8 U/L — ABNORMAL LOW (ref 9–46)
AST: 18 U/L (ref 10–35)
Albumin: 4.5 g/dL (ref 3.6–5.1)
Alkaline phosphatase (APISO): 60 U/L (ref 35–144)
BUN: 13 mg/dL (ref 7–25)
CO2: 28 mmol/L (ref 20–32)
Calcium: 9.6 mg/dL (ref 8.6–10.3)
Chloride: 103 mmol/L (ref 98–110)
Creat: 0.98 mg/dL (ref 0.70–1.22)
Globulin: 3.1 g/dL (calc) (ref 1.9–3.7)
Glucose, Bld: 76 mg/dL (ref 65–99)
Potassium: 4.4 mmol/L (ref 3.5–5.3)
Sodium: 140 mmol/L (ref 135–146)
Total Bilirubin: 0.4 mg/dL (ref 0.2–1.2)
Total Protein: 7.6 g/dL (ref 6.1–8.1)
eGFR: 76 mL/min/{1.73_m2} (ref >=60)

## 2020-09-24 LAB — LIPID PANEL
Cholesterol: 195 mg/dL (ref <200)
HDL: 77 mg/dL (ref >=40)
LDL Cholesterol (Calc): 102 mg/dL (calc) — ABNORMAL HIGH
Non-HDL Cholesterol (Calc): 118 mg/dL (calc) (ref <130)
Total CHOL/HDL Ratio: 2.5 (calc) (ref <5.0)
Triglycerides: 74 mg/dL (ref <150)

## 2020-09-24 LAB — MAGNESIUM: Magnesium: 2 mg/dL (ref 1.5–2.5)

## 2020-09-24 LAB — TSH: TSH: 2.95 mIU/L (ref 0.40–4.50)

## 2020-09-24 LAB — HEMOGLOBIN A1C
Hgb A1c MFr Bld: 6 % of total Hgb — ABNORMAL HIGH (ref <5.7)
Mean Plasma Glucose: 126 mg/dL
eAG (mmol/L): 7 mmol/L

## 2020-09-24 LAB — URIC ACID: Uric Acid, Serum: 4.3 mg/dL (ref 4.0–8.0)

## 2020-09-24 LAB — INSULIN, RANDOM: Insulin: 15.2 u[IU]/mL

## 2020-09-24 LAB — VITAMIN D 25 HYDROXY (VIT D DEFICIENCY, FRACTURES): Vit D, 25-Hydroxy: 72 ng/mL (ref 30–100)

## 2020-09-24 NOTE — Progress Notes (Signed)
============================================================ -   Test results slightly outside the reference range are not unusual. If there is anything important, I will review this with you,  otherwise it is considered normal test values.  If you have further questions,  please do not hesitate to contact me at the office or via My Chart.  ============================================================ ============================================================  -  Total Chol = 195   Excellent   - Very low risk for Heart Attack  / Stroke ============================================================ ============================================================  -  A1c = 6.0%  - slightly elevated          (  Ideal or Goal is less than 8.5%  )   - Uric acid / gout test is Normal & OK  ============================================================ ============================================================  -  All Else - CBC - Kidneys - Electrolytes - Liver - Magnesium & Thyroid    - all  Normal / OK ============================================================ ============================================================

## 2020-11-06 ENCOUNTER — Other Ambulatory Visit: Payer: Self-pay | Admitting: Internal Medicine

## 2020-11-06 DIAGNOSIS — U071 COVID-19: Secondary | ICD-10-CM | POA: Diagnosis not present

## 2020-11-06 DIAGNOSIS — K047 Periapical abscess without sinus: Secondary | ICD-10-CM

## 2020-11-06 DIAGNOSIS — N3281 Overactive bladder: Secondary | ICD-10-CM

## 2020-12-19 NOTE — Progress Notes (Signed)
FOLLOW UP  Assessment and Plan:      Continue diet and meds as discussed. Further disposition pending results of labs. Over 30 minutes of exam, counseling, chart review, and critical decision making was performed  Future Appointments  Date Time Provider Department Center  12/24/2020  3:30 PM Revonda Humphrey, NP [facility] None  03/27/2021 10:00 AM [name], MD [facility] None  06/30/2021  2:30 PM Revonda Humphrey, NP [facility] None     HPI 86 y.o. male  presents for 3 month follow up on hypertension, cholesterol, prediabetes, and vitamin D deficiency.    His blood pressure has been controlled at home, today their BP is    BMI is There is no height or weight on file to calculate BMI., he is working on diet and exercise. Wt Readings from Last 3 Encounters:  09/23/20 180 lb (81.6 kg)  06/27/20 179 lb (81.2 kg)  04/13/20 182 lb 9.6 oz (82.8 kg)    He does not workout. He denies chest pain, shortness of breath, dizziness.   He  is  on cholesterol medication, lopid and zetia due to statin intolerance with elevated CPK in the past. His cholesterol is at goal. The cholesterol last visit was:   Lab Results  Component Value Date   CHOL 195 09/23/2020   HDL 77 09/23/2020   LDLCALC 102 (H) 09/23/2020   TRIG 74 09/23/2020   CHOLHDL 2.5 09/23/2020     He has been working on diet and exercise for prediabetes, since his wife has dementia,  He is doing the cooking and has increased his veggies and denies paresthesia of the feet, polydipsia, polyuria and visual disturbances. Last A1C in the office was:  Lab Results  Component Value Date   HGBA1C 6.0 (H) 09/23/2020   Patient is on Vitamin D supplement.   Lab Results  Component Value Date   VD25OH 72 09/23/2020      Current Medications:    Current Outpatient Medications (Cardiovascular):    ezetimibe (ZETIA) 10 MG tablet, TAKE 1 TABLET BY MOUTH  DAILY FOR CHOLESTEROL   gemfibrozil (LOPID) 600 MG tablet, TAKE 1 TABLET TWICE DAILY   WITH MEALS FOR CHOLESTEROL  Current Outpatient Medications (Respiratory):    triamcinolone (NASACORT) 55 MCG/ACT AERO nasal inhaler, Place 2 sprays into the nose at bedtime.  Current Outpatient Medications (Analgesics):    allopurinol (ZYLOPRIM) 300 MG tablet, TAKE 1 TABLET BY MOUTH  DAILY TO PREVENT GOUT   Current Outpatient Medications (Other):    Cholecalciferol (VITAMIN D PO), Take 5,000 Int'l Units by mouth daily.   clonazePAM (KLONOPIN) 2 MG tablet, TAKE 1/2 TO 1 TABLET BY  MOUTH AT BEDTIME ONLY IF  NEEDED FOR SLEEP & LIMIT TO 5 DAYS /WEEK TO AVOID  ADDICTION & DEMENTIA   doxycycline (VIBRAMYCIN) 100 MG capsule, TAKE 2 CAPSULES NOW WITH FOOD, THEN TAKE 1 CAPSULE 2 TIMES A DAY WITH MEALS FOR DENTAL INFECTION   famotidine (PEPCID) 20 MG tablet, TAKE 1 TABLET (20 MG TOTAL) BY MOUTH 2 (TWO) TIMES DAILY AS NEEDED FOR HEARTBURN OR INDIGESTION. TAKE 30 MIN PRIOR TO BREAKFAST AND BEDTIME.   oxybutynin (DITROPAN-XL) 10 MG 24 hr tablet, TAKE 1 TABLET BY MOUTH EVERY DAY   traZODone (DESYREL) 150 MG tablet, TAKE 1 TABLET BY MOUTH 1  HOUR BEFORE BEDTIME AS  NEEDED FOR SLEEP  Medical History:  Past Medical History:  Diagnosis Date   Gout    Hyperlipidemia    Hypertension    Prediabetes  Allergies:  Allergies  Allergen Reactions   Ace Inhibitors Cough   Lipitor [Atorvastatin]     Elevates CPK and Aldolase     Review of Systems:  Review of Systems  Constitutional:  Negative for chills, fever and weight loss.  HENT:  Negative for congestion and hearing loss.   Eyes:  Negative for blurred vision and double vision.  Respiratory:  Negative for cough and shortness of breath.   Cardiovascular:  Negative for chest pain, palpitations, orthopnea and leg swelling.  Gastrointestinal:  Negative for abdominal pain, constipation, diarrhea, heartburn, nausea and vomiting.  Musculoskeletal:  Negative for falls, joint pain and myalgias.  Skin:  Negative for rash.  Neurological:  Negative for  dizziness, tingling, tremors, loss of consciousness and headaches.  Psychiatric/Behavioral:  Negative for depression, memory loss and suicidal ideas.    Family history- Review and unchanged Social history- Review and unchanged Physical Exam: There were no vitals taken for this visit. Wt Readings from Last 3 Encounters:  09/23/20 180 lb (81.6 kg)  06/27/20 179 lb (81.2 kg)  04/13/20 182 lb 9.6 oz (82.8 kg)   General Appearance: Well nourished, in no apparent distress. Eyes: PERRLA, EOMs, conjunctiva no swelling or erythema Sinuses: No Frontal/maxillary tenderness ENT/Mouth: Ext aud canals clear, TMs without erythema, bulging. No erythema, swelling, or exudate on post pharynx.  Tonsils not swollen or erythematous. Hearing normal.  Neck: Supple, thyroid normal.  Respiratory: Respiratory effort normal, BS equal bilaterally without rales, rhonchi, wheezing or stridor.  Cardio: RRR with no MRGs. Brisk peripheral pulses without edema.  Abdomen: Soft, + BS,  Non tender, no guarding, rebound, hernias, masses. Lymphatics: Non tender without lymphadenopathy.  Musculoskeletal: Full ROM, 5/5 strength, Normal gait Skin: Warm, dry without rashes, lesions, ecchymosis.  Neuro: Cranial nerves intact. Normal muscle tone, no cerebellar symptoms. Psych: Awake and oriented X 3, normal affect, Insight and Judgment appropriate.    Revonda Humphrey, NP 4:04 PM [facility]

## 2020-12-24 ENCOUNTER — Ambulatory Visit (INDEPENDENT_AMBULATORY_CARE_PROVIDER_SITE_OTHER): Payer: Medicare Other | Admitting: Nurse Practitioner

## 2020-12-24 ENCOUNTER — Encounter: Payer: Self-pay | Admitting: Nurse Practitioner

## 2020-12-24 ENCOUNTER — Other Ambulatory Visit: Payer: Self-pay

## 2020-12-24 VITALS — BP 132/70 | HR 75 | Temp 97.7°F | Wt 180.4 lb

## 2020-12-24 DIAGNOSIS — I10 Essential (primary) hypertension: Secondary | ICD-10-CM | POA: Diagnosis not present

## 2020-12-24 DIAGNOSIS — Z23 Encounter for immunization: Secondary | ICD-10-CM | POA: Diagnosis not present

## 2020-12-24 DIAGNOSIS — M1000 Idiopathic gout, unspecified site: Secondary | ICD-10-CM | POA: Diagnosis not present

## 2020-12-24 DIAGNOSIS — E782 Mixed hyperlipidemia: Secondary | ICD-10-CM | POA: Diagnosis not present

## 2020-12-24 DIAGNOSIS — Z9989 Dependence on other enabling machines and devices: Secondary | ICD-10-CM

## 2020-12-24 DIAGNOSIS — Z789 Other specified health status: Secondary | ICD-10-CM

## 2020-12-24 DIAGNOSIS — Z79899 Other long term (current) drug therapy: Secondary | ICD-10-CM

## 2020-12-24 DIAGNOSIS — G4733 Obstructive sleep apnea (adult) (pediatric): Secondary | ICD-10-CM

## 2020-12-24 DIAGNOSIS — R7309 Other abnormal glucose: Secondary | ICD-10-CM

## 2020-12-24 DIAGNOSIS — R209 Unspecified disturbances of skin sensation: Secondary | ICD-10-CM

## 2020-12-24 DIAGNOSIS — E559 Vitamin D deficiency, unspecified: Secondary | ICD-10-CM

## 2020-12-24 DIAGNOSIS — G720 Drug-induced myopathy: Secondary | ICD-10-CM

## 2020-12-24 DIAGNOSIS — K219 Gastro-esophageal reflux disease without esophagitis: Secondary | ICD-10-CM

## 2020-12-24 NOTE — Progress Notes (Signed)
FOLLOW UP  Assessment and Plan:   Doye was seen today for follow-up.  Diagnoses and all orders for this visit:  Essential hypertension Continue medications, DASH diet, exercise and monitor at home. Call if greater than 130/80.   Go to the ER if any chest pain, shortness of breath, nausea, dizziness, severe HA, changes vision/speech  -     CBC with Differential/Platelet  Hyperlipidemia, mixed/Statin myopathy/intolerance -     COMPLETE METABOLIC PANEL WITH GFR -     Lipid panel Continue low saturated fat diet and exercise Continue Zetia and lopid, unable to tolerate statins  Abnormal glucose -     Insulin, random Continue diet and exercise.  Perform daily foot/skin check, notify office of any concerning changes.   Vitamin D deficiency At goal at last visit; continue supplementation to maintain goal of 70-100 Defer Vit D level  Cold left foot Normal lower extremity ultrasound 02/07/18 Possible raynauds- to exercise as tolerated, dress in layers, avoid caffeine Monitor and if worsens call the office for further evaluation  Idiopathic gout, unspecified chronicity, unspecified site Continue allopurinol Diet discussed Check uric acid as needed  Gastroesophageal reflux disease without esophagitis Symptoms are currently managed well with dietary modifications  Medication management Continued  Flu vaccine need High dose flu vaccine given    Continue diet and meds as discussed. Further disposition pending results of labs. Over 30 minutes of exam, counseling, chart review, and critical decision making was performed  Future Appointments  Date Time Provider Department Center  03/27/2021 10:00 AM [name], MD [facility] None  06/30/2021  2:30 PM Revonda Humphrey, NP [facility] None     HPI 86 y.o. male  presents for 3 month follow up on hypertension, cholesterol, prediabetes, and vitamin D deficiency.   He does notice his left foot is cold to touch more often than  right foot  This his been ongoing for several years. Had previous vascular ultrasound of lower extremity that was normal bilaterally. Denies burning pain, pins and needles.   His blood pressure has been controlled at home, today their BP is BP: 132/70  BMI is Body mass index is 24.81 kg/m., he is working on diet and exercise. Wt Readings from Last 3 Encounters:  12/24/20 180 lb 6.4 oz (81.8 kg)  09/23/20 180 lb (81.6 kg)  06/27/20 179 lb (81.2 kg)    He does not workout. He denies chest pain, shortness of breath, dizziness.   He  is  on cholesterol medication, lopid and zetia due to statin intolerance with elevated CPK in the past. His cholesterol is at goal. The cholesterol last visit was:   Lab Results  Component Value Date   CHOL 195 09/23/2020   HDL 77 09/23/2020   LDLCALC 102 (H) 09/23/2020   TRIG 74 09/23/2020   CHOLHDL 2.5 09/23/2020     He has been working on diet and exercise for prediabetes, since his wife has dementia,  He is doing the cooking and has increased his veggies and denies paresthesia of the feet, polydipsia, polyuria and visual disturbances. Last A1C in the office was:  Lab Results  Component Value Date   HGBA1C 6.0 (H) 09/23/2020   Patient is on Vitamin D supplement.   Lab Results  Component Value Date   VD25OH 72 09/23/2020      Current Medications:    Current Outpatient Medications (Cardiovascular):    ezetimibe (ZETIA) 10 MG tablet, TAKE 1 TABLET BY MOUTH  DAILY FOR CHOLESTEROL   gemfibrozil (  LOPID) 600 MG tablet, TAKE 1 TABLET TWICE DAILY  WITH MEALS FOR CHOLESTEROL  Current Outpatient Medications (Respiratory):    triamcinolone (NASACORT) 55 MCG/ACT AERO nasal inhaler, Place 2 sprays into the nose at bedtime.  Current Outpatient Medications (Analgesics):    allopurinol (ZYLOPRIM) 300 MG tablet, TAKE 1 TABLET BY MOUTH  DAILY TO PREVENT GOUT   Current Outpatient Medications (Other):    Cholecalciferol (VITAMIN D PO), Take 5,000 Int'l Units  by mouth daily.   clonazePAM (KLONOPIN) 2 MG tablet, TAKE 1/2 TO 1 TABLET BY  MOUTH AT BEDTIME ONLY IF  NEEDED FOR SLEEP & LIMIT TO 5 DAYS /WEEK TO AVOID  ADDICTION & DEMENTIA   famotidine (PEPCID) 20 MG tablet, TAKE 1 TABLET (20 MG TOTAL) BY MOUTH 2 (TWO) TIMES DAILY AS NEEDED FOR HEARTBURN OR INDIGESTION. TAKE 30 MIN PRIOR TO BREAKFAST AND BEDTIME.   traZODone (DESYREL) 150 MG tablet, TAKE 1 TABLET BY MOUTH 1  HOUR BEFORE BEDTIME AS  NEEDED FOR SLEEP   doxycycline (VIBRAMYCIN) 100 MG capsule, TAKE 2 CAPSULES NOW WITH FOOD, THEN TAKE 1 CAPSULE 2 TIMES A DAY WITH MEALS FOR DENTAL INFECTION (Patient not taking: Reported on 12/24/2020)   oxybutynin (DITROPAN-XL) 10 MG 24 hr tablet, TAKE 1 TABLET BY MOUTH EVERY DAY (Patient not taking: Reported on 12/24/2020)  Medical History:  Past Medical History:  Diagnosis Date   Gout    Hyperlipidemia    Hypertension    Prediabetes    Allergies:  Allergies  Allergen Reactions   Ace Inhibitors Cough   Lipitor [Atorvastatin]     Elevates CPK and Aldolase     Review of Systems:  Review of Systems  Constitutional:  Negative for chills, fever and weight loss.  HENT:  Negative for congestion and hearing loss.   Eyes:  Negative for blurred vision and double vision.  Respiratory:  Negative for cough and shortness of breath.   Cardiovascular:  Negative for chest pain, palpitations, orthopnea and leg swelling.  Gastrointestinal:  Negative for abdominal pain, constipation, diarrhea, heartburn, nausea and vomiting.  Musculoskeletal:  Negative for falls, joint pain and myalgias.       Left foot cold> right foot  Skin:  Negative for rash.  Neurological:  Negative for dizziness, tingling, tremors, loss of consciousness and headaches.  Psychiatric/Behavioral:  Negative for depression, memory loss and suicidal ideas.    Family history- Review and unchanged Social history- Review and unchanged Physical Exam: BP 132/70   Pulse 75   Temp 97.7 F (36.5 C)    Wt 180 lb 6.4 oz (81.8 kg)   SpO2 97%   BMI 24.81 kg/m  Wt Readings from Last 3 Encounters:  12/24/20 180 lb 6.4 oz (81.8 kg)  09/23/20 180 lb (81.6 kg)  06/27/20 179 lb (81.2 kg)   General Appearance: Well nourished, in no apparent distress. Eyes: PERRLA, EOMs, conjunctiva no swelling or erythema Sinuses: No Frontal/maxillary tenderness ENT/Mouth: Ext aud canals clear, TMs without erythema, bulging. No erythema, swelling, or exudate on post pharynx.  Tonsils not swollen or erythematous. Hearing normal.  Neck: Supple, thyroid normal.  Respiratory: Respiratory effort normal, BS equal bilaterally without rales, rhonchi, wheezing or stridor.  Cardio: RRR with no MRGs. Brisk peripheral pulses without edema.  Abdomen: Soft, + BS,  Non tender, no guarding, rebound, hernias, masses. Lymphatics: Non tender without lymphadenopathy.  Musculoskeletal: Full ROM, 5/5 strength, Normal gait. Left foot cool to touch, pulses normal DP and PT, sensation intact with monofilament Skin: Warm, dry without rashes,  lesions, ecchymosis.  Neuro: Cranial nerves intact. Normal muscle tone, no cerebellar symptoms. Psych: Awake and oriented X 3, normal affect, Insight and Judgment appropriate.    Revonda Humphrey, NP 3:34 PM [facility]

## 2020-12-24 NOTE — Patient Instructions (Signed)
Raynaud's Phenomenon °Raynaud's phenomenon is a condition that affects the blood vessels (arteries) that carry blood to the fingers and toes. The arteries that supply blood to the ears, lips, nipples, or the tip of the nose might also be affected. Raynaud's phenomenon causes the arteries to become narrow temporarily (spasm). As a result, the flow of blood to the affected areas is temporarily decreased. This usually occurs in response to cold temperatures or stress. During an attack, the skin in the affected areas turns white, then blue, and finally red. A person may also feel tingling or numbness in those areas. °Attacks usually last for only a brief period, and then the blood flow to the area returns to normal. In most cases, Raynaud's phenomenon does not cause serious health problems. °What are the causes? °In many cases, the cause of this condition is not known. The condition may occur on its own (primary Raynaud's phenomenon) or may be associated with other diseases or factors (secondary Raynaud's phenomenon). °Possible causes may include: °Diseases or medical conditions that damage the arteries. °Injuries and repetitive actions that hurt the hands or feet. °Being exposed to certain chemicals. °Taking medicines that narrow the arteries. °Other medical conditions, such as lupus, scleroderma, rheumatoid arthritis, thyroid problems, blood disorders, Sjogren syndrome, or atherosclerosis. °What increases the risk? °The following factors may make you more likely to develop this condition: °Being 20-40 years old. °Being male. °Having a family history of Raynaud's phenomenon. °Living in a cold climate. °Smoking. °What are the signs or symptoms? °Symptoms of this condition usually occur when you are exposed to cold temperatures or when you have emotional stress. The symptoms may last for a few minutes or up to several hours. They usually affect your fingers but may also affect your toes, nipples, lips, ears, or the tip  of your nose. Symptoms may include: °Changes in skin color. The skin in the affected areas will turn pale or white. The skin may then change from white to bluish to red as normal blood flow returns to the area. °Numbness, tingling, or pain in the affected areas. °In severe cases, symptoms may include: °Skin sores. °Tissues decaying and dying (gangrene). °How is this diagnosed? °This condition may be diagnosed based on: °Your symptoms and medical history. °A physical exam. During the exam, you may be asked to put your hands in cold water to check for a reaction to cold temperature. °Tests, such as: °Blood tests to check for other diseases or conditions. °A test to check the movement of blood through your arteries and veins (vascular ultrasound). °A test in which the skin at the base of your fingernail is examined under a microscope (nailfold capillaroscopy). °How is this treated? °During an episode, you can take actions to help symptoms go away faster. Options include moving your arms around in a windmill pattern, warming your fingers under warm water, or placing your fingers in a warm body fold, such as your armpit. °Long-term treatment for this condition often involves making lifestyle changes and taking steps to control your exposure to cold temperature. For more severe cases, medicine (calcium channel blockers) may be used to improve blood circulation. °Follow these instructions at home: °Avoiding cold temperatures °Take these steps to avoid exposure to cold: °If possible, stay indoors during cold weather. °When you go outside during cold weather, dress in layers and wear mittens, a hat, a scarf, and warm footwear. °Wear mittens or gloves when handling ice or frozen food. °Use holders for glasses or cans containing cold   drinks. °Let warm water run for a while before taking a shower or bath. °Warm up the car before driving in cold weather. °Lifestyle °If possible, avoid stressful and emotional situations. Try to  find ways to manage your stress, such as: °Exercise. °Yoga. °Meditation. °Biofeedback. °Do not use any products that contain nicotine or tobacco. These products include cigarettes, chewing tobacco, and vaping devices, such as e-cigarettes. If you need help quitting, ask your health care provider. °Avoid secondhand smoke. °Limit your use of caffeine. °Switch to decaffeinated coffee, tea, and soda. °Avoid chocolate. °Avoid vibrating tools and machinery. °General instructions °Protect your hands and feet from injuries, cuts, or bruises. °Avoid wearing tight rings or wristbands. °Wear loose fitting socks and comfortable, roomy shoes. °Take over-the-counter and prescription medicines only as told by your health care provider. °Where to find support °Raynaud's Association: www.raynauds.org °Where to find more information °National Institute of Arthritis and Musculoskeletal and Skin Diseases: www.niams.nih.gov °Contact a health care provider if: °Your discomfort becomes worse despite lifestyle changes. °You develop sores on your fingers or toes that do not heal. °You have breaks in the skin on your fingers or toes. °You have a fever. °You have pain or swelling in your joints. °You have a rash. °Your symptoms occur on only one side of your body. °Get help right away if: °Your fingers or toes turn black. °You have severe pain in the affected areas. °These symptoms may represent a serious problem that is an emergency. Do not wait to see if the symptoms will go away. Get medical help right away. Call your local emergency services (911 in the [place] Do not drive yourself to the hospital. °Summary °Raynaud's phenomenon is a condition that affects the arteries that carry blood to the fingers, toes, ears, lips, nipples, or the tip of the nose. °In many cases, the cause of this condition is not known. °Symptoms of this condition include changes in skin color along with numbness and tingling in the affected area. °Treatment for this  condition includes lifestyle changes and reducing exposure to cold temperatures. Medicines may be used for severe cases of the condition. °Contact your health care provider if your condition worsens despite treatment. °This information is not intended to replace advice given to you by your health care provider. Make sure you discuss any questions you have with your health care provider. °Document Revised: 04/23/2020 Document Reviewed: 04/23/2020 °[employer]vier Patient Education © 2022 [employer] ° °

## 2020-12-25 LAB — CBC WITH DIFFERENTIAL/PLATELET
Absolute Monocytes: 381 cells/uL (ref 200–950)
Basophils Absolute: 38 cells/uL (ref 0–200)
Basophils Relative: 0.8 %
Eosinophils Absolute: 451 cells/uL (ref 15–500)
Eosinophils Relative: 9.6 %
HCT: 40.8 % (ref 38.5–50.0)
Hemoglobin: 13.8 g/dL (ref 13.2–17.1)
Lymphs Abs: 2557 cells/uL (ref 850–3900)
MCH: 30.9 pg (ref 27.0–33.0)
MCHC: 33.8 g/dL (ref 32.0–36.0)
MCV: 91.5 fL (ref 80.0–100.0)
MPV: 10.4 fL (ref 7.5–12.5)
Monocytes Relative: 8.1 %
Neutro Abs: 1274 cells/uL — ABNORMAL LOW (ref 1500–7800)
Neutrophils Relative %: 27.1 %
Platelets: 229 10*3/uL (ref 140–400)
RBC: 4.46 10*6/uL (ref 4.20–5.80)
RDW: 13 % (ref 11.0–15.0)
Total Lymphocyte: 54.4 %
WBC: 4.7 10*3/uL (ref 3.8–10.8)

## 2020-12-25 LAB — LIPID PANEL
Cholesterol: 185 mg/dL (ref <200)
HDL: 77 mg/dL (ref >=40)
LDL Cholesterol (Calc): 92 mg/dL (calc)
Non-HDL Cholesterol (Calc): 108 mg/dL (calc) (ref <130)
Total CHOL/HDL Ratio: 2.4 (calc) (ref <5.0)
Triglycerides: 72 mg/dL (ref <150)

## 2020-12-25 LAB — COMPLETE METABOLIC PANEL WITH GFR
AG Ratio: 1.3 (calc) (ref 1.0–2.5)
ALT: 8 U/L — ABNORMAL LOW (ref 9–46)
AST: 19 U/L (ref 10–35)
Albumin: 4.1 g/dL (ref 3.6–5.1)
Alkaline phosphatase (APISO): 61 U/L (ref 35–144)
BUN: 14 mg/dL (ref 7–25)
CO2: 24 mmol/L (ref 20–32)
Calcium: 9.2 mg/dL (ref 8.6–10.3)
Chloride: 101 mmol/L (ref 98–110)
Creat: 1.09 mg/dL (ref 0.70–1.22)
Globulin: 3.2 g/dL (calc) (ref 1.9–3.7)
Glucose, Bld: 130 mg/dL — ABNORMAL HIGH (ref 65–99)
Potassium: 3.8 mmol/L (ref 3.5–5.3)
Sodium: 135 mmol/L (ref 135–146)
Total Bilirubin: 0.4 mg/dL (ref 0.2–1.2)
Total Protein: 7.3 g/dL (ref 6.1–8.1)
eGFR: 67 mL/min/{1.73_m2} (ref >=60)

## 2020-12-25 LAB — INSULIN, RANDOM: Insulin: 24.7 u[IU]/mL — ABNORMAL HIGH

## 2021-02-06 ENCOUNTER — Other Ambulatory Visit: Payer: Self-pay | Admitting: Adult Health

## 2021-02-14 ENCOUNTER — Other Ambulatory Visit: Payer: Self-pay | Admitting: Internal Medicine

## 2021-02-14 DIAGNOSIS — I10 Essential (primary) hypertension: Secondary | ICD-10-CM

## 2021-03-25 DIAGNOSIS — G4733 Obstructive sleep apnea (adult) (pediatric): Secondary | ICD-10-CM | POA: Diagnosis not present

## 2021-03-26 ENCOUNTER — Encounter: Payer: Self-pay | Admitting: Internal Medicine

## 2021-03-26 NOTE — Patient Instructions (Signed)

## 2021-03-26 NOTE — Progress Notes (Signed)
Annual  Screening/Preventative Visit  & Comprehensive Evaluation & Examination  Future Appointments  Date Time Provider Department  03/27/2021 10:00 AM [name], MD [facility]  06/30/2021  2:30 PM Revonda Humphrey, NP [facility]  04/01/2022 10:00 AM [name], MD [facility]            This very nice 87 y.o. MBM  presents for a Screening /Preventative Visit & comprehensive evaluation and management of multiple medical co-morbidities.  Patient has been followed for HTN, HLD, Prediabetes and Vitamin D Deficiency. Patient has Gout controlled on meds.        HTN predates since 1998 . Patient's BP has been controlled at home.  Today's BP is at goal -  130/70. Patient denies any cardiac symptoms as chest pain, palpitations, shortness of breath, dizziness or ankle swelling.       Patient's hyperlipidemia is controlled with diet and medications. Patient denies myalgias or other medication SE's. Last lipids were at goal :  Lab Results  Component Value Date   CHOL 185 12/24/2020   HDL 77 12/24/2020   LDLCALC 92 12/24/2020   TRIG 72 12/24/2020   CHOLHDL 2.4 12/24/2020         Patient has hx/o prediabetes (A1c 6.3% /2011) and patient denies reactive hypoglycemic symptoms, visual blurring, diabetic polys or paresthesias. Last A1c was not at goal :   Lab Results  Component Value Date   HGBA1C 6.0 (H) 09/23/2020          Finally, patient has history of Vitamin D Deficiency ("25" /2008) and last vitamin D was at goal :   Lab Results  Component Value Date   VD25OH 72 09/23/2020     Current Outpatient Medications on File Prior to Visit  Medication Sig   allopurinol 300 MG tablet TAKE 1 TABLET  DAILY    VITAMIN D5,000 Units  Take daily.   clonazePAM 2 MG tablet TAKE 1/2 TO 1 TABLET  AT BEDTIME    ezetimibe  10 MG tablet TAKE 1 TABLET   DAILY    famotidine  20 MG tablet TAKE 1 TABLET 30 MIN PRIOR TO BREAKFAST AND BEDTIME AS NEEDED    gemfibrozil 600 MG tablet TAKE 1 TABLET  TWICE DAILY    traZODone 150 MG tablet TAKE 1 TABLET  1  HOUR BEFORE BEDTIME      Allergies  Allergen Reactions   Ace Inhibitors Cough   Lipitor [Atorvastatin]     Elevates CPK and Aldolase    Past Medical History:  Diagnosis Date   Gout    Hyperlipidemia    Hypertension    Prediabetes      Health Maintenance  Topic Date Due   Zoster Vaccines- Shingrix (1 of 2) Never done   Pneumonia Vaccine 30+ Years old (2 - PPSV23 if available, else PCV20) 03/29/2015   COVID-19 Vaccine (4 - Booster for Moderna series) 03/31/2020   TETANUS/TDAP  10/24/2024   INFLUENZA VACCINE  Completed   HPV VACCINES  Aged Out     Immunization History  Administered Date(s) Administered   Influenza, High Dose  01/26/2018, 11/10/2018, 12/24/2020   Influenza,inj,Quad  03/07/2013   Moderna Sars-Covid-2 Vacc 03/14/2019, 04/11/2019, 02/04/2020   Pneumococcal -13 03/28/2014   Pneumococcal-23 03/03/2007   Td 03/02/2004   Tdap 10/13/2007. 10/25/2014   Zoster, Live 08/26/2016    Last Colon - 10/13/2007  - - Dr Arlyce Dice - hyperplastic polyps  - No F/u recommended Due to age.  Past Surgical History:  Procedure Laterality  Date   Cardiac Event Monitor  May-June 2017   Sinus rhythm with average heart rate 74.2 - no notable arrhythmias. Minimal PVCs. No PACs.   CARPAL TUNNEL RELEASE Left 2007   CATARACT EXTRACTION, BILATERAL     OTHER SURGICAL HISTORY  2007   Negative biopsy tonsil mass   ROTATOR CUFF REPAIR Left 2000     Family History  Problem Relation Age of Onset   Hypertension Mother    Stroke Father    Seizures Sister    Colon cancer Neg Hx    Stomach cancer Neg Hx    Esophageal cancer Neg Hx    Inflammatory bowel disease Neg Hx    Liver disease Neg Hx    Pancreatic cancer Neg Hx    Rectal cancer Neg Hx      Social History   Tobacco Use   Smoking status: Never   Smokeless tobacco: Never  Vaping Use   Vaping Use: Never used  Substance Use Topics   Alcohol use: Yes     Alcohol/week: 15.0 standard drinks    Types: 15 Standard drinks or equivalent per week    Comment: every day 2-3 drimks a day    Drug use: No      ROS Constitutional: Denies fever, chills, weight loss/gain, headaches, insomnia,  night sweats or change in appetite. Does c/o fatigue. Eyes: Denies redness, blurred vision, diplopia, discharge, itchy or watery eyes.  ENT: Denies discharge, congestion, post nasal drip, epistaxis, sore throat, earache, hearing loss, dental pain, Tinnitus, Vertigo, Sinus pain or snoring.  Cardio: Denies chest pain, palpitations, irregular heartbeat, syncope, dyspnea, diaphoresis, orthopnea, PND, claudication or edema Respiratory: denies cough, dyspnea, DOE, pleurisy, hoarseness, laryngitis or wheezing.  Gastrointestinal: Denies dysphagia, heartburn, reflux, water brash, pain, cramps, nausea, vomiting, bloating, diarrhea, constipation, hematemesis, melena, hematochezia, jaundice or hemorrhoids Genitourinary: Denies dysuria, frequency, urgency, nocturia, hesitancy, discharge, hematuria or flank pain Musculoskeletal: Denies arthralgia, myalgia, stiffness, Jt. Swelling, pain, limp or strain/sprain. Denies Falls. Skin: Denies puritis, rash, hives, warts, acne, eczema or change in skin lesion Neuro: No weakness, tremor, incoordination, spasms, paresthesia or pain Psychiatric: Denies confusion, memory loss or sensory loss. Denies Depression. Endocrine: Denies change in weight, skin, hair change, nocturia, and paresthesia, diabetic polys, visual blurring or hyper / hypo glycemic episodes.  Heme/Lymph: No excessive bleeding, bruising or enlarged lymph nodes.   Physical Exam  BP 130/70    Pulse 65    Temp 97.9 F (36.6 C)    Resp 16    Ht 5' 11.5" (1.816 m)    Wt 182 lb 3.2 oz (82.6 kg)    SpO2 96%    BMI 25.06 kg/m   General Appearance: Well nourished and well groomed and in no apparent distress.  Eyes: PERRLA, EOMs, conjunctiva no swelling or erythema, normal fundi  and vessels. Sinuses: No frontal/maxillary tenderness ENT/Mouth: EACs patent / TMs  nl. Nares clear without erythema, swelling, mucoid exudates. Oral hygiene is good. No erythema, swelling, or exudate. Tongue normal, non-obstructing. Tonsils not swollen or erythematous. Hearing normal.  Neck: Supple, thyroid not palpable. No bruits, nodes or JVD. Respiratory: Respiratory effort normal.  BS equal and clear bilateral without rales, rhonci, wheezing or stridor. Cardio: Heart sounds are normal with regular rate and rhythm and no murmurs, rubs or gallops. Peripheral pulses are normal and equal bilaterally without edema. No aortic or femoral bruits. Chest: symmetric with normal excursions and percussion.  Abdomen: Soft, with Nl bowel sounds. Nontender, no guarding, rebound, hernias, masses, or organomegaly.  Lymphatics: Non tender without lymphadenopathy.  Musculoskeletal: Full ROM all peripheral extremities, joint stability, 5/5 strength, and normal gait. Skin: Warm and dry without rashes, lesions, cyanosis, clubbing or  ecchymosis.  Neuro: Cranial nerves intact, reflexes equal bilaterally. Normal muscle tone, no cerebellar symptoms. Sensation intact.  Pysch: Alert and oriented X 3 with normal affect, insight and judgment appropriate.   Assessment and Plan  1. Annual Preventative/Screening Exam    2. Essential hypertension  - EKG 12-Lead - [place], RETROPERITNL ABD,  LTD - Urinalysis, Routine w reflex microscopic - Microalbumin / creatinine urine ratio - CBC with Differential/Platelet - COMPLETE METABOLIC PANEL WITH GFR - Magnesium - TSH  3. Hyperlipidemia, mixed  - EKG 12-Lead - [place], RETROPERITNL ABD,  LTD - Lipid panel - TSH  4. Abnormal glucose  - EKG 12-Lead - [place], RETROPERITNL ABD,  LTD - Hemoglobin A1c - Insulin, random  5. Vitamin D deficiency  - VITAMIN D 25 Hydroxy   6. Idiopathic gout  - Uric acid  7. Gastroesophageal reflux disease   - CBC with  Differential/Platelet  8. OSA on CPAP   9. Screening for colorectal cancer  - POC Hemoccult Bld/Stl   10. BPH with obstruction/lower urinary tract symptoms  - PSA  11. Prostate cancer screening  - PSA  12. Screening for ischemic heart disease  - EKG 12-Lead  13. FH: hypertension  - EKG 12-Lead - [place], RETROPERITNL ABD,  LTD  14. Screening for AAA (aortic abdominal aneurysm)  - [place], RETROPERITNL ABD,  LTD  15. Medication management  - Urinalysis, Routine w reflex microscopic - Microalbumin / creatinine urine ratio - CBC with Differential/Platelet - COMPLETE METABOLIC PANEL WITH GFR - Magnesium - Lipid panel - TSH - Hemoglobin A1c - Insulin, random - VITAMIN D 25 Hydroxy           Patient was counseled in prudent diet, weight control to achieve/maintain BMI less than 25, BP monitoring, regular exercise and medications as discussed.  Discussed med effects and SE's. Routine screening labs and tests as requested with regular follow-up as recommended. Over 40 minutes of exam, counseling, chart review and high complex critical decision making was performed   Jeremy Bouchard, MD

## 2021-03-27 ENCOUNTER — Ambulatory Visit (INDEPENDENT_AMBULATORY_CARE_PROVIDER_SITE_OTHER): Payer: Medicare Other | Admitting: Internal Medicine

## 2021-03-27 ENCOUNTER — Encounter: Payer: Self-pay | Admitting: Internal Medicine

## 2021-03-27 ENCOUNTER — Other Ambulatory Visit: Payer: Self-pay

## 2021-03-27 VITALS — BP 130/70 | HR 65 | Temp 97.9°F | Resp 16 | Ht 71.5 in | Wt 182.2 lb

## 2021-03-27 DIAGNOSIS — Z136 Encounter for screening for cardiovascular disorders: Secondary | ICD-10-CM

## 2021-03-27 DIAGNOSIS — E782 Mixed hyperlipidemia: Secondary | ICD-10-CM | POA: Diagnosis not present

## 2021-03-27 DIAGNOSIS — Z8249 Family history of ischemic heart disease and other diseases of the circulatory system: Secondary | ICD-10-CM | POA: Diagnosis not present

## 2021-03-27 DIAGNOSIS — Z0000 Encounter for general adult medical examination without abnormal findings: Secondary | ICD-10-CM

## 2021-03-27 DIAGNOSIS — Z125 Encounter for screening for malignant neoplasm of prostate: Secondary | ICD-10-CM

## 2021-03-27 DIAGNOSIS — M1000 Idiopathic gout, unspecified site: Secondary | ICD-10-CM

## 2021-03-27 DIAGNOSIS — Z0001 Encounter for general adult medical examination with abnormal findings: Secondary | ICD-10-CM

## 2021-03-27 DIAGNOSIS — I10 Essential (primary) hypertension: Secondary | ICD-10-CM | POA: Diagnosis not present

## 2021-03-27 DIAGNOSIS — Z1211 Encounter for screening for malignant neoplasm of colon: Secondary | ICD-10-CM

## 2021-03-27 DIAGNOSIS — R7309 Other abnormal glucose: Secondary | ICD-10-CM

## 2021-03-27 DIAGNOSIS — N138 Other obstructive and reflux uropathy: Secondary | ICD-10-CM

## 2021-03-27 DIAGNOSIS — G4733 Obstructive sleep apnea (adult) (pediatric): Secondary | ICD-10-CM

## 2021-03-27 DIAGNOSIS — K219 Gastro-esophageal reflux disease without esophagitis: Secondary | ICD-10-CM

## 2021-03-27 DIAGNOSIS — E559 Vitamin D deficiency, unspecified: Secondary | ICD-10-CM

## 2021-03-27 DIAGNOSIS — Z79899 Other long term (current) drug therapy: Secondary | ICD-10-CM

## 2021-03-28 LAB — CBC WITH DIFFERENTIAL/PLATELET
Absolute Monocytes: 304 cells/uL (ref 200–950)
Basophils Absolute: 42 cells/uL (ref 0–200)
Basophils Relative: 1.1 %
Eosinophils Absolute: 285 cells/uL (ref 15–500)
Eosinophils Relative: 7.5 %
HCT: 45.1 % (ref 38.5–50.0)
Hemoglobin: 14.6 g/dL (ref 13.2–17.1)
Lymphs Abs: 1824 cells/uL (ref 850–3900)
MCH: 29.9 pg (ref 27.0–33.0)
MCHC: 32.4 g/dL (ref 32.0–36.0)
MCV: 92.4 fL (ref 80.0–100.0)
MPV: 10.3 fL (ref 7.5–12.5)
Monocytes Relative: 8 %
Neutro Abs: 1345 cells/uL — ABNORMAL LOW (ref 1500–7800)
Neutrophils Relative %: 35.4 %
Platelets: 238 10*3/uL (ref 140–400)
RBC: 4.88 10*6/uL (ref 4.20–5.80)
RDW: 12.7 % (ref 11.0–15.0)
Total Lymphocyte: 48 %
WBC: 3.8 10*3/uL (ref 3.8–10.8)

## 2021-03-28 LAB — COMPLETE METABOLIC PANEL WITH GFR
AG Ratio: 1.2 (calc) (ref 1.0–2.5)
ALT: 7 U/L — ABNORMAL LOW (ref 9–46)
AST: 20 U/L (ref 10–35)
Albumin: 4.1 g/dL (ref 3.6–5.1)
Alkaline phosphatase (APISO): 63 U/L (ref 35–144)
BUN: 13 mg/dL (ref 7–25)
CO2: 31 mmol/L (ref 20–32)
Calcium: 9.6 mg/dL (ref 8.6–10.3)
Chloride: 102 mmol/L (ref 98–110)
Creat: 1.16 mg/dL (ref 0.70–1.22)
Globulin: 3.4 g/dL (calc) (ref 1.9–3.7)
Glucose, Bld: 87 mg/dL (ref 65–99)
Potassium: 4.4 mmol/L (ref 3.5–5.3)
Sodium: 139 mmol/L (ref 135–146)
Total Bilirubin: 0.5 mg/dL (ref 0.2–1.2)
Total Protein: 7.5 g/dL (ref 6.1–8.1)
eGFR: 62 mL/min/{1.73_m2} (ref >=60)

## 2021-03-28 LAB — MICROALBUMIN / CREATININE URINE RATIO
Creatinine, Urine: 24 mg/dL (ref 20–320)
Microalb Creat Ratio: 13 mcg/mg creat (ref <30)
Microalb, Ur: 0.3 mg/dL

## 2021-03-28 LAB — URINALYSIS, ROUTINE W REFLEX MICROSCOPIC
Bilirubin Urine: NEGATIVE
Glucose, UA: NEGATIVE
Hgb urine dipstick: NEGATIVE
Ketones, ur: NEGATIVE
Leukocytes,Ua: NEGATIVE
Nitrite: NEGATIVE
Protein, ur: NEGATIVE
Specific Gravity, Urine: 1.005 (ref 1.001–1.035)
pH: 7.5 (ref 5.0–8.0)

## 2021-03-28 LAB — VITAMIN D 25 HYDROXY (VIT D DEFICIENCY, FRACTURES): Vit D, 25-Hydroxy: 64 ng/mL (ref 30–100)

## 2021-03-28 LAB — INSULIN, RANDOM: Insulin: 4.6 u[IU]/mL

## 2021-03-28 LAB — LIPID PANEL
Cholesterol: 189 mg/dL (ref <200)
HDL: 79 mg/dL (ref >=40)
LDL Cholesterol (Calc): 96 mg/dL (calc)
Non-HDL Cholesterol (Calc): 110 mg/dL (calc) (ref <130)
Total CHOL/HDL Ratio: 2.4 (calc) (ref <5.0)
Triglycerides: 55 mg/dL (ref <150)

## 2021-03-28 LAB — MAGNESIUM: Magnesium: 2.1 mg/dL (ref 1.5–2.5)

## 2021-03-28 LAB — PSA: PSA: 2.92 ng/mL (ref <=4.00)

## 2021-03-28 LAB — URIC ACID: Uric Acid, Serum: 5.3 mg/dL (ref 4.0–8.0)

## 2021-03-28 LAB — TSH: TSH: 2.83 mIU/L (ref 0.40–4.50)

## 2021-03-28 LAB — HEMOGLOBIN A1C
Hgb A1c MFr Bld: 6 % of total Hgb — ABNORMAL HIGH (ref <5.7)
Mean Plasma Glucose: 126 mg/dL
eAG (mmol/L): 7 mmol/L

## 2021-03-28 NOTE — Progress Notes (Signed)
============================================================ °-   Test results slightly outside the reference range are not unusual. If there is anything important, I will review this with you,  otherwise it is considered normal test values.  If you have further questions,  please do not hesitate to contact me at the office or via My Chart.  ============================================================ ============================================================  -  A1c = 6.0% - Blood sugar and A1c are STILL elevated                in the borderline and early or pre-diabetes range which has the same   300% increased risk for heart attack, stroke, cancer and                                     alzheimer- type vascular dementia as full blown diabetes.   But the good news is that diet, exercise with                                              weight loss can cure the early diabetes at this point. ============================================================ ============================================================  -  It is very important that you work harder with diet by  avoiding all foods that are white except chicken,   fish & calliflower.  - Avoid white rice  (brown & wild rice is OK),   - Avoid white potatoes  (sweet potatoes in moderation is OK),   White bread or wheat bread or anything made out of   white flour like bagels, donuts, rolls, buns, biscuits, cakes,  - pastries, cookies, pizza crust, and pasta (made from  white flour & egg whites)   - vegetarian pasta or spinach or wheat pasta is OK.  - Multigrain breads like Arnold's, Pepperidge Farm or   multigrain sandwich thins or high fiber breads like   Eureka bread or "Dave's Killer" breads that are  4 to 5 grams fiber per slice !  are best.    Diet, exercise and weight loss can reverse and cure  diabetes in the early stages.    ============================================================ ============================================================  -  Uric acid / Gout test is Normal &OK - please continue Allopurinol  ============================================================ ============================================================  -  Total Chol = 189    & LDL Chol = 96  - Both  Excellent   - Very low risk for Heart Attack  / Stroke ============================================================ ============================================================  -  Vitamin D = 64 - Great ! - Please continue dose same ============================================================ ============================================================  -  All Else - CBC - Kidneys - Electrolytes - Liver - Magnesium & Thyroid    - all  Normal / OK  ============================================================ ============================================================

## 2021-04-19 ENCOUNTER — Other Ambulatory Visit: Payer: Self-pay | Admitting: Adult Health

## 2021-04-19 DIAGNOSIS — G4700 Insomnia, unspecified: Secondary | ICD-10-CM

## 2021-04-23 ENCOUNTER — Other Ambulatory Visit: Payer: Self-pay | Admitting: Internal Medicine

## 2021-04-23 DIAGNOSIS — K219 Gastro-esophageal reflux disease without esophagitis: Secondary | ICD-10-CM

## 2021-04-23 MED ORDER — FAMOTIDINE 20 MG PO TABS: ORAL_TABLET | ORAL | 3 refills | Status: DC

## 2021-05-18 ENCOUNTER — Other Ambulatory Visit: Payer: Self-pay | Admitting: Nurse Practitioner

## 2021-05-18 DIAGNOSIS — N3281 Overactive bladder: Secondary | ICD-10-CM

## 2021-05-23 ENCOUNTER — Other Ambulatory Visit: Payer: Self-pay | Admitting: Adult Health

## 2021-05-23 DIAGNOSIS — E782 Mixed hyperlipidemia: Secondary | ICD-10-CM

## 2021-06-20 ENCOUNTER — Other Ambulatory Visit: Payer: Self-pay | Admitting: Internal Medicine

## 2021-06-25 NOTE — Progress Notes (Signed)
MEDICARE ANNUAL WELLNESS VISIT AND FOLLOW UP ?Assessment:  ? ?Encounter for Medicare annual wellness exam ?1 year ? ?Essential hypertension ?- continue medications, DASH diet, exercise and monitor at home. Call if greater than 130/80.  ?-     CBC with Differential/Platelet ?-     COMPLETE METABOLIC PANEL WITH GFR ? ? ?OSA on CPAP ?Continue CPAP ? ?Mixed hyperlipidemia, statin intolerance ?check lipids ?decrease fatty foods ?increase activity.  ?-     Lipid panel ? ?Abnormal glucose ?Discussed disease progression and risks ?Discussed diet/exercise, weight management and risk modification ? ? ?Medication management ?Magnesium ? ?Vitamin D deficiency ?Continue supplement ? ?History of colonic polyps ?Declines follow up due to age ? ?Idiopathic gout, unspecified chronicity, unspecified site ?Gout- recheck Uric acid as needed ?- Uric acid ? ?Gastroesophageal reflux disease with esophagitis ?Initiated medication for new reflux related symptoms: famotidine 29 mg BID ?Discussed diet, avoiding triggers and other lifestyle changes ?Follow up if not improving ?- Magnesium ? ?Insomnia ?Continue trazodone daily, klonopin PRN, doing well limiting use ?Monitor closely  ? ?Neuropathy ?Monitor symptoms ?Will try Gabapentin 100 mg QHS ? ?Sinus congestion ?Continue Nasocort as needed ? ?Need for pneumococcal vaccine ?Prevnar 20 given ? ? ?Future Appointments  ?Date Time Provider Department Center  ?10/02/2021 10:30 AM [name], MD [facility] None  ?04/01/2022 10:00 AM [name], MD [facility] None  ?07/02/2022  4:00 PM Revonda Humphrey, NP [facility] None  ? ? ? ?Plan:  ? ?During the course of the visit the patient was educated and counseled about appropriate screening and preventive services including:  ? ?Pneumococcal vaccine  ?Influenza vaccine ?Td vaccine ?Screening electrocardiogram ?Colorectal cancer screening ?Diabetes screening ?Glaucoma screening ?Nutrition counseling  ? ? ?Subjective:  ?Jeremy Boone is a AA 87 y.o.  male who presents for Medicare Annual Wellness Visit and 3 month follow up for HTN, hyperlipidemia, prediabetes, and vitamin D Def.  ? ?Wife has dementia, he is primary caregiver. Goes to [employer] during the day.  ? ?He has CPAP machine, reports is wearing without difficulty, endorses restorative sleep. He is on trazodone daily and klonopin as needed for insomnia, will take occasionally if unable to fall back asleep after waking up, takes 1/2 tab only.  ? ?He reports GERD symptoms are currently controlled with Famotidine. ? ?He is complaining of burning pain in feet worse when pressure is placed on them. Occ has coldness more on left foot. He had previous ABI in 2019 that was normal. ? ?He was having urinary urgency and mild incontinence, reports improved with oxybutynin but does not take regularly.  ? ?BMI is Body mass index is 24.81 kg/m?., he has been working on diet and generally not intentionally exercising, active in home and yard, but could [place].  ?Wt Readings from Last 3 Encounters:  ?07/01/21 180 lb 6.4 oz (81.8 kg)  ?03/27/21 182 lb 3.2 oz (82.6 kg)  ?12/24/20 180 lb 6.4 oz (81.8 kg)  ? ?His blood pressure has been controlled at home, today their BP is BP: 112/80,  ?BP Readings from Last 3 Encounters:  ?07/01/21 112/80  ?03/27/21 130/70  ?12/24/20 132/70  ?He does workout. He denies chest pain, dyspnea, dizziness.  ? ? ?He is on cholesterol medication, lopid 1200 once daily due to intolerance of statins due to elevated CPK/aldolase and denies myalgias. His cholesterol is not at goal. The cholesterol last visit was:   ?Lab Results  ?Component Value Date  ? CHOL 189 03/27/2021  ? HDL 79 03/27/2021  ?  LDLCALC 96 03/27/2021  ? TRIG 55 03/27/2021  ? CHOLHDL 2.4 03/27/2021  ? ?He has been working on diet and exercise for prediabetes, and denies paresthesia of the feet, polydipsia, polyuria and visual disturbances. Last A1C in the office was:  ?Lab Results  ?Component Value Date  ? HGBA1C  6.0 (H) 03/27/2021  ? ?Patient is on Vitamin D supplement.   ?Lab Results  ?Component Value Date  ? VD25OH 64 03/27/2021  ? ? ?Patient is on allopurinol (150 mg only) for gout and does not report a recent flare. ?Lab Results  ?Component Value Date  ? LABURIC 5.3 03/27/2021  ? ? ? ? ?Medication Review: ?Current Outpatient Medications on File Prior to Visit  ?Medication Sig Dispense Refill  ? allopurinol (ZYLOPRIM) 300 MG tablet TAKE 1 TABLET BY MOUTH  DAILY FOR GOUT PREVENTION 90 tablet 3  ? Cholecalciferol (VITAMIN D PO) Take 5,000 Int'l Units by mouth daily.    ? clonazePAM (KLONOPIN) 2 MG tablet TAKE 1/2 TO 1 TABLET BY  MOUTH AT BEDTIME ONLY IF  NEEDED FOR SLEEP & LIMIT TO 5 DAYS /WEEK TO AVOID  ADDICTION & DEMENTIA 30 tablet 0  ? ezetimibe (ZETIA) 10 MG tablet TAKE 1 TABLET BY MOUTH  DAILY FOR CHOLESTEROL 90 tablet 3  ? famotidine (PEPCID) 20 MG tablet TAKE 1 TABLET 30 MIN PRIOR TO BREAKFAST AND BEDTIME AS NEEDED FOR HEARTBURN/INDIGESTION 180 tablet 3  ? gemfibrozil (LOPID) 600 MG tablet TAKE 1 TABLET BY MOUTH  TWICE DAILY WITH MEALS FOR  CHOLESTEROL 180 tablet 3  ? oxybutynin (DITROPAN-XL) 10 MG 24 hr tablet TAKE 1 TABLET BY MOUTH EVERY DAY 90 tablet 1  ? traZODone (DESYREL) 150 MG tablet TAKE 1 TABLET BY MOUTH 1  HOUR BEFORE BEDTIME AS  NEEDED FOR SLEEP 90 tablet 3  ? ?No current facility-administered medications on file prior to visit.  ? ? ?Current Problems (verified) ?Patient Active Problem List  ? Diagnosis Date Noted  ? Insomnia 06/27/2020  ? Statin myopathy 11/21/2019  ? Constipation 12/15/2017  ? OSA on CPAP 04/19/2015  ? Vitamin D deficiency 09/14/2013  ? Medication management 09/14/2013  ? History of colonic polyps 08/02/2013  ? Gastroesophageal reflux disease without esophagitis 08/02/2013  ? Hyperlipidemia, mixed   ? Essential hypertension   ? Idiopathic gout   ? Abnormal glucose   ? ? ?Screening Tests ?Immunization History  ?Administered Date(s) Administered  ? Influenza, High Dose Seasonal PF  12/18/2013, 10/28/2015, 12/10/2016, 01/26/2018, 11/10/2018, 12/24/2020  ? Influenza,inj,Quad PF,6+ Mos 03/07/2013  ? Moderna Sars-Covid-2 Vaccination 03/14/2019, 04/11/2019, 02/04/2020  ? Pneumococcal Conjugate-13 03/28/2014  ? Pneumococcal-Unspecified 03/03/2007  ? Td 03/02/2004  ? Tdap 10/25/2014  ? Zoster, Live 08/26/2016  ? ? ?Preventative care: ?Last colonoscopy: 10/13/07 polyps, diverticulosis DECLINES DUE TO AGE ?EGD: 2015 Dr. Arlyce Dice ? ?Prior vaccinations: ?TD or Tdap: 2016 ?Influenza:12/24/20 ?Pneumococcal: 2009 ?Prevnar 13: 2016 ?Shingles/Zostavax: 2018 ?Covid 19: 3/3, 2021 ? ?Names of Other Physician/Practitioners you currently use: ?1. [facility] here for primary care ?2. Last eye: Dr. Marland Kitchen 2021 ?3. Last dental, 2022, has scheduled next month  ? ?Patient Care Team: ?[name], MD as PCP - General (Internal Medicine) ?Teresa Coombs, MD as Consulting Physician (Ophthalmology) ?A[place], Illene Labrador, MD (Inactive) as Consulting Physician (Orthopedic Surgery) ?Louis Meckel, MD (Inactive) as Consulting Physician (Gastroenterology) ?Carilyn Goodpasture as Physician Assistant (Sleep Medicine) ? ? ?History reviewed: allergies, current medications, past family history, past medical history, past social history, past surgical history and problem  list ? ?Allergies ?Allergies  ?Allergen Reactions  ? Ace Inhibitors Cough  ? Lipitor [Atorvastatin]   ?  Elevates CPK and Aldolase  ? ? ?SURGICAL HISTORY ?He  has a past surgical history that includes Carpal tunnel release (Left, 2007); Rotator cuff repair (Left, 2000); Other surgical history (2007); Cardiac Event Monitor (May-June 2017); and Cataract extraction, bilateral. ?FAMILY HISTORY ?His family history includes Hypertension in his mother; Seizures in his sister; Stroke in his father. ?SOCIAL HISTORY ?He  reports that he has never smoked. He has never used smokeless tobacco. He reports current alcohol use of about 15.0 standard  drinks per week. He reports that he does not use drugs. ? ?MEDICARE WELLNESS OBJECTIVES: ?Physical activity: Current Exercise Habits: The patient does not participate in regular exercise at present, [facility]

## 2021-06-30 ENCOUNTER — Ambulatory Visit: Payer: Medicare Other | Admitting: Nurse Practitioner

## 2021-07-01 ENCOUNTER — Ambulatory Visit (INDEPENDENT_AMBULATORY_CARE_PROVIDER_SITE_OTHER): Payer: Medicare Other | Admitting: Nurse Practitioner

## 2021-07-01 ENCOUNTER — Encounter: Payer: Self-pay | Admitting: Nurse Practitioner

## 2021-07-01 VITALS — BP 112/80 | HR 65 | Temp 97.5°F | Wt 180.4 lb

## 2021-07-01 DIAGNOSIS — G4733 Obstructive sleep apnea (adult) (pediatric): Secondary | ICD-10-CM | POA: Diagnosis not present

## 2021-07-01 DIAGNOSIS — E782 Mixed hyperlipidemia: Secondary | ICD-10-CM

## 2021-07-01 DIAGNOSIS — Z0000 Encounter for general adult medical examination without abnormal findings: Secondary | ICD-10-CM

## 2021-07-01 DIAGNOSIS — R0981 Nasal congestion: Secondary | ICD-10-CM

## 2021-07-01 DIAGNOSIS — G4700 Insomnia, unspecified: Secondary | ICD-10-CM | POA: Diagnosis not present

## 2021-07-01 DIAGNOSIS — K219 Gastro-esophageal reflux disease without esophagitis: Secondary | ICD-10-CM

## 2021-07-01 DIAGNOSIS — E559 Vitamin D deficiency, unspecified: Secondary | ICD-10-CM | POA: Diagnosis not present

## 2021-07-01 DIAGNOSIS — I10 Essential (primary) hypertension: Secondary | ICD-10-CM | POA: Diagnosis not present

## 2021-07-01 DIAGNOSIS — M1000 Idiopathic gout, unspecified site: Secondary | ICD-10-CM

## 2021-07-01 DIAGNOSIS — R6889 Other general symptoms and signs: Secondary | ICD-10-CM | POA: Diagnosis not present

## 2021-07-01 DIAGNOSIS — Z0001 Encounter for general adult medical examination with abnormal findings: Secondary | ICD-10-CM | POA: Diagnosis not present

## 2021-07-01 DIAGNOSIS — Z23 Encounter for immunization: Secondary | ICD-10-CM | POA: Diagnosis not present

## 2021-07-01 DIAGNOSIS — Z789 Other specified health status: Secondary | ICD-10-CM

## 2021-07-01 DIAGNOSIS — R7309 Other abnormal glucose: Secondary | ICD-10-CM | POA: Diagnosis not present

## 2021-07-01 DIAGNOSIS — Z79899 Other long term (current) drug therapy: Secondary | ICD-10-CM | POA: Diagnosis not present

## 2021-07-01 DIAGNOSIS — Z86010 Personal history of colonic polyps: Secondary | ICD-10-CM

## 2021-07-01 DIAGNOSIS — G629 Polyneuropathy, unspecified: Secondary | ICD-10-CM

## 2021-07-01 MED ORDER — GABAPENTIN 100 MG PO CAPS: 100.0000 mg | ORAL_CAPSULE | Freq: Every day | ORAL | 2 refills | Status: DC

## 2021-07-01 MED ORDER — TRIAMCINOLONE ACETONIDE 55 MCG/ACT NA AERO: 2.0000 | INHALATION_SPRAY | Freq: Every day | NASAL | 1 refills | Status: DC

## 2021-07-02 LAB — COMPLETE METABOLIC PANEL WITH GFR
AG Ratio: 1.2 (calc) (ref 1.0–2.5)
ALT: 9 U/L (ref 9–46)
AST: 19 U/L (ref 10–35)
Albumin: 4.1 g/dL (ref 3.6–5.1)
Alkaline phosphatase (APISO): 60 U/L (ref 35–144)
BUN: 16 mg/dL (ref 7–25)
CO2: 28 mmol/L (ref 20–32)
Calcium: 9.3 mg/dL (ref 8.6–10.3)
Chloride: 100 mmol/L (ref 98–110)
Creat: 1.11 mg/dL (ref 0.70–1.22)
Globulin: 3.4 g/dL (calc) (ref 1.9–3.7)
Glucose, Bld: 93 mg/dL (ref 65–99)
Potassium: 4 mmol/L (ref 3.5–5.3)
Sodium: 137 mmol/L (ref 135–146)
Total Bilirubin: 0.3 mg/dL (ref 0.2–1.2)
Total Protein: 7.5 g/dL (ref 6.1–8.1)
eGFR: 65 mL/min/{1.73_m2} (ref >=60)

## 2021-07-02 LAB — LIPID PANEL
Cholesterol: 194 mg/dL (ref <200)
HDL: 76 mg/dL (ref >=40)
LDL Cholesterol (Calc): 98 mg/dL (calc)
Non-HDL Cholesterol (Calc): 118 mg/dL (calc) (ref <130)
Total CHOL/HDL Ratio: 2.6 (calc) (ref <5.0)
Triglycerides: 107 mg/dL (ref <150)

## 2021-07-02 LAB — CBC WITH DIFFERENTIAL/PLATELET
Absolute Monocytes: 536 cells/uL (ref 200–950)
Basophils Absolute: 38 cells/uL (ref 0–200)
Basophils Relative: 0.8 %
Eosinophils Absolute: 508 cells/uL — ABNORMAL HIGH (ref 15–500)
Eosinophils Relative: 10.8 %
HCT: 43.4 % (ref 38.5–50.0)
Hemoglobin: 14.3 g/dL (ref 13.2–17.1)
Lymphs Abs: 2153 cells/uL (ref 850–3900)
MCH: 30.2 pg (ref 27.0–33.0)
MCHC: 32.9 g/dL (ref 32.0–36.0)
MCV: 91.8 fL (ref 80.0–100.0)
MPV: 10.1 fL (ref 7.5–12.5)
Monocytes Relative: 11.4 %
Neutro Abs: 1466 cells/uL — ABNORMAL LOW (ref 1500–7800)
Neutrophils Relative %: 31.2 %
Platelets: 249 10*3/uL (ref 140–400)
RBC: 4.73 10*6/uL (ref 4.20–5.80)
RDW: 13 % (ref 11.0–15.0)
Total Lymphocyte: 45.8 %
WBC: 4.7 10*3/uL (ref 3.8–10.8)

## 2021-07-02 LAB — URIC ACID: Uric Acid, Serum: 5.1 mg/dL (ref 4.0–8.0)

## 2021-07-02 LAB — MAGNESIUM: Magnesium: 2 mg/dL (ref 1.5–2.5)

## 2021-07-14 DIAGNOSIS — G4733 Obstructive sleep apnea (adult) (pediatric): Secondary | ICD-10-CM | POA: Diagnosis not present

## 2021-10-01 ENCOUNTER — Encounter: Payer: Self-pay | Admitting: Internal Medicine

## 2021-10-01 NOTE — Progress Notes (Unsigned)
Future Appointments  Date Time Provider Department  10/02/2021             6 mo ov 10:30 AM [name], MD [facility]  04/01/2022           cpe 10:00 AM [name], MD [facility]  07/02/2022            wellness  4:00 PM Raynelle Dick, NP [facility]    History of Present Illness:       This very nice 87 y.o. MBM presents for 6  month follow up with HTN, HLD, Pre-Diabetes and Vitamin D Deficiency.  Patient has Gout controlled on his Allopurinol. He also is on CPAP for OSA with improved Sleep Hygiene. Patient also has oBPH / LUTS improved with Tamsulosin. This am, patient is c/o mild dysuria.        Patient is treated for HTN (1998) & BP has been controlled at home. Today's BP  is at goal -  136/78 . Patient has had no complaints of any cardiac type chest pain, palpitations, dyspnea / orthopnea / PND, dizziness, claudication, or dependent edema.       Hyperlipidemia is controlled with diet & meds. Patient denies myalgias or other med SE's. Last Lipids were at goal:  Lab Results  Component Value Date   CHOL 174 06/27/2020   HDL 71 06/27/2020   LDLCALC 89 06/27/2020   TRIG 48 06/27/2020   CHOLHDL 2.5 06/27/2020     Also, the patient has history of PreDiabetes  (A1c 6.3% /2011) and has had no symptoms of reactive hypoglycemia, diabetic polys, paresthesias or visual blurring.  Last A1c was not at goal:  Lab Results  Component Value Date   HGBA1C 6.0 (H) 03/12/2020        Further, the patient also has history of Vitamin D Deficiency  ("25" /2008) and supplements vitamin D without any suspected side-effects. Last vitamin D was at goal:  Lab Results  Component Value Date   VD25OH 70 03/12/2020    Allergies  Allergen Reactions   Ace Inhibitors Cough   Lipitor [Atorvastatin]     Elevates CPK and Aldolase    PMHx:   Past Medical History:  Diagnosis Date   Gout    Hyperlipidemia    Hypertension    Prediabetes     Immunization History   Administered Date(s) Administered   Influenza, High Dose  12/18/2013, 10/28/2015, 12/10/2016, 01/26/2018, 11/10/2018   Influenza,inj,Quad  03/07/2013   Moderna Sars-Covid-2 Vacci 03/14/2019, 04/11/2019, 02/04/2020   Pneumococcal -13 03/28/2014   Pneumococcal-23 03/03/2007   Td 03/02/2004   Tdap 10/25/2014   Zoster, Live 08/26/2016     Past Surgical History:  Procedure Laterality Date   Cardiac Event Monitor  May-June 2017   Sinus rhythm with average heart rate 74.2 - no notable arrhythmias. Minimal PVCs. No PACs.   CARPAL TUNNEL RELEASE Left 2007   CATARACT EXTRACTION, BILATERAL     OTHER SURGICAL HISTORY  2007   Negative biopsy tonsil mass   ROTATOR CUFF REPAIR Left 2000    FHx:    Reviewed / unchanged  SHx:    Reviewed / unchanged   Systems Review:  Constitutional: Denies fever, chills, wt changes, headaches, insomnia, fatigue, night sweats, change in appetite. Eyes: Denies redness, blurred vision, diplopia, discharge, itchy, watery eyes.  ENT: Denies discharge, congestion, post nasal drip, epistaxis, sore throat, earache, hearing loss, dental pain, tinnitus, vertigo, sinus pain, snoring.  CV: Denies chest pain, palpitations, irregular heartbeat,  syncope, dyspnea, diaphoresis, orthopnea, PND, claudication or edema. Respiratory: denies cough, dyspnea, DOE, pleurisy, hoarseness, laryngitis, wheezing.  Gastrointestinal: Denies dysphagia, odynophagia, heartburn, reflux, water brash, abdominal pain or cramps, nausea, vomiting, bloating, diarrhea, constipation, hematemesis, melena, hematochezia  or hemorrhoids. Genitourinary: Denies dysuria, frequency, urgency, nocturia, hesitancy, discharge, hematuria or flank pain. Musculoskeletal: Denies arthralgias, myalgias, stiffness, jt. swelling, pain, limping or strain/sprain.  Skin: Denies pruritus, rash, hives, warts, acne, eczema or change in skin lesion(s). Neuro: No weakness, tremor, incoordination, spasms, paresthesia or  pain. Psychiatric: Denies confusion, memory loss or sensory loss. Endo: Denies change in weight, skin or hair change.  Heme/Lymph: No excessive bleeding, bruising or enlarged lymph nodes.  Physical Exam  BP 136/78   Pulse 64   Temp (!) 96.9 F (36.1 C)   Ht 5' 11.5" (1.816 m)   Wt 180 lb (81.6 kg)   SpO2 96%   BMI 24.76 kg/m   Appears  well nourished, well groomed  and in no distress.  Eyes: PERRLA, EOMs, conjunctiva no swelling or erythema. Sinuses: No frontal/maxillary tenderness ENT/Mouth: EAC's clear, TM's nl w/o erythema, bulging. Nares clear w/o erythema, swelling, exudates. Oropharynx clear without erythema or exudates. Oral hygiene is good. Tongue normal, non obstructing. Hearing intact.  Neck: Supple. Thyroid not palpable. Car 2+/2+ without bruits, nodes or JVD. Chest: Respirations nl with BS clear & equal w/o rales, rhonchi, wheezing or stridor.  Cor: Heart sounds normal w/ regular rate and rhythm without sig. murmurs, gallops, clicks or rubs. Peripheral pulses normal and equal  without edema.  Abdomen: Soft & bowel sounds normal. Non-tender w/o guarding, rebound, hernias, masses or organomegaly.  Lymphatics: Unremarkable.  Musculoskeletal: Full ROM all peripheral extremities, joint stability, 5/5 strength and normal gait.  Skin: Warm, dry without exposed rashes, lesions or ecchymosis apparent.  Neuro: Cranial nerves intact, reflexes equal bilaterally. Sensory-motor testing grossly intact. Tendon reflexes grossly intact.  Pysch: Alert & oriented x 3.  Insight and judgement nl & appropriate. No ideations.  Assessment and Plan:  1. Essential hypertension  - Continue medication, monitor blood pressure at home.  - Continue DASH diet.  Reminder to go to the ER if any CP,  SOB, nausea, dizziness, severe HA, changes vision/speech.    - CBC with Differential/Platelet - COMPLETE METABOLIC PANEL WITH GFR - Magnesium - TSH  2. Hyperlipidemia, mixed  - Continue diet/meds,  exercise,& lifestyle modifications.  - Continue monitor periodic cholesterol/liver & renal functions       - Lipid panel - TSH  3. Abnormal glucose  - Continue diet, exercise  - Lifestyle modifications.  - Monitor appropriate labs   - Hemoglobin A1c - Insulin, random  4. Vitamin D deficiency  - Continue supplementation    - VITAMIN D 25 Hydroxy   5. Idiopathic gout  - Uric acid  6. Medication management  - CBC with Differential/Platelet - COMPLETE METABOLIC PANEL WITH GFR - Magnesium - Lipid panel - TSH - Hemoglobin A1c - Insulin, random - VITAMIN D 25 Hydroxy         Discussed  regular exercise, BP monitoring, weight control to achieve/maintain BMI less than 25 and discussed med and SE's. Recommended labs to assess and monitor clinical status with further disposition pending results of labs.  I discussed the assessment and treatment plan with the patient. The patient was provided an opportunity to ask questions and all were answered. The patient agreed with the plan and demonstrated an understanding of the instructions.  I provided over 30 minutes of exam,  counseling, chart review and  complex critical decision making.        The patient was advised to call back or seek an in-person evaluation if the symptoms worsen or if the condition fails to improve as anticipated.   Marinus Maw, MD

## 2021-10-01 NOTE — Patient Instructions (Signed)

## 2021-10-02 ENCOUNTER — Ambulatory Visit (INDEPENDENT_AMBULATORY_CARE_PROVIDER_SITE_OTHER): Payer: Medicare Other | Admitting: Internal Medicine

## 2021-10-02 ENCOUNTER — Encounter: Payer: Self-pay | Admitting: Internal Medicine

## 2021-10-02 VITALS — BP 136/78 | HR 64 | Temp 96.9°F | Ht 71.5 in | Wt 180.0 lb

## 2021-10-02 DIAGNOSIS — R300 Dysuria: Secondary | ICD-10-CM | POA: Diagnosis not present

## 2021-10-02 DIAGNOSIS — I10 Essential (primary) hypertension: Secondary | ICD-10-CM

## 2021-10-02 DIAGNOSIS — R7309 Other abnormal glucose: Secondary | ICD-10-CM | POA: Diagnosis not present

## 2021-10-02 DIAGNOSIS — E559 Vitamin D deficiency, unspecified: Secondary | ICD-10-CM | POA: Diagnosis not present

## 2021-10-02 DIAGNOSIS — M1000 Idiopathic gout, unspecified site: Secondary | ICD-10-CM | POA: Diagnosis not present

## 2021-10-02 DIAGNOSIS — E782 Mixed hyperlipidemia: Secondary | ICD-10-CM

## 2021-10-02 DIAGNOSIS — Z79899 Other long term (current) drug therapy: Secondary | ICD-10-CM | POA: Diagnosis not present

## 2021-10-03 LAB — URINALYSIS, ROUTINE W REFLEX MICROSCOPIC
Bilirubin Urine: NEGATIVE
Glucose, UA: NEGATIVE
Hgb urine dipstick: NEGATIVE
Ketones, ur: NEGATIVE
Leukocytes,Ua: NEGATIVE
Nitrite: NEGATIVE
Protein, ur: NEGATIVE
Specific Gravity, Urine: 1.006 (ref 1.001–1.035)
pH: 7.5 (ref 5.0–8.0)

## 2021-10-03 LAB — URINE CULTURE
MICRO NUMBER:: 13733080
Result:: NO GROWTH
SPECIMEN QUALITY:: ADEQUATE

## 2021-10-03 LAB — CBC WITH DIFFERENTIAL/PLATELET
Absolute Monocytes: 458 cells/uL (ref 200–950)
Basophils Absolute: 21 cells/uL (ref 0–200)
Basophils Relative: 0.5 %
Eosinophils Absolute: 277 cells/uL (ref 15–500)
Eosinophils Relative: 6.6 %
HCT: 39.9 % (ref 38.5–50.0)
Hemoglobin: 13.5 g/dL (ref 13.2–17.1)
Lymphs Abs: 1995 cells/uL (ref 850–3900)
MCH: 31 pg (ref 27.0–33.0)
MCHC: 33.8 g/dL (ref 32.0–36.0)
MCV: 91.7 fL (ref 80.0–100.0)
MPV: 9.8 fL (ref 7.5–12.5)
Monocytes Relative: 10.9 %
Neutro Abs: 1449 cells/uL — ABNORMAL LOW (ref 1500–7800)
Neutrophils Relative %: 34.5 %
Platelets: 223 10*3/uL (ref 140–400)
RBC: 4.35 10*6/uL (ref 4.20–5.80)
RDW: 13 % (ref 11.0–15.0)
Total Lymphocyte: 47.5 %
WBC: 4.2 10*3/uL (ref 3.8–10.8)

## 2021-10-03 LAB — LIPID PANEL
Cholesterol: 180 mg/dL (ref <200)
HDL: 74 mg/dL (ref >=40)
LDL Cholesterol (Calc): 90 mg/dL (calc)
Non-HDL Cholesterol (Calc): 106 mg/dL (calc) (ref <130)
Total CHOL/HDL Ratio: 2.4 (calc) (ref <5.0)
Triglycerides: 70 mg/dL (ref <150)

## 2021-10-03 LAB — COMPLETE METABOLIC PANEL WITH GFR
AG Ratio: 1.3 (calc) (ref 1.0–2.5)
ALT: 9 U/L (ref 9–46)
AST: 17 U/L (ref 10–35)
Albumin: 4.1 g/dL (ref 3.6–5.1)
Alkaline phosphatase (APISO): 47 U/L (ref 35–144)
BUN: 22 mg/dL (ref 7–25)
CO2: 25 mmol/L (ref 20–32)
Calcium: 9.1 mg/dL (ref 8.6–10.3)
Chloride: 102 mmol/L (ref 98–110)
Creat: 1.14 mg/dL (ref 0.70–1.22)
Globulin: 3.1 g/dL (calc) (ref 1.9–3.7)
Glucose, Bld: 85 mg/dL (ref 65–99)
Potassium: 4.3 mmol/L (ref 3.5–5.3)
Sodium: 137 mmol/L (ref 135–146)
Total Bilirubin: 0.4 mg/dL (ref 0.2–1.2)
Total Protein: 7.2 g/dL (ref 6.1–8.1)
eGFR: 63 mL/min/{1.73_m2} (ref >=60)

## 2021-10-03 LAB — INSULIN, RANDOM: Insulin: 7 u[IU]/mL

## 2021-10-03 LAB — URIC ACID: Uric Acid, Serum: 5.6 mg/dL (ref 4.0–8.0)

## 2021-10-03 LAB — HEMOGLOBIN A1C
Hgb A1c MFr Bld: 5.7 % of total Hgb — ABNORMAL HIGH (ref <5.7)
Mean Plasma Glucose: 117 mg/dL
eAG (mmol/L): 6.5 mmol/L

## 2021-10-03 LAB — VITAMIN D 25 HYDROXY (VIT D DEFICIENCY, FRACTURES): Vit D, 25-Hydroxy: 60 ng/mL (ref 30–100)

## 2021-10-03 LAB — TSH: TSH: 1.24 mIU/L (ref 0.40–4.50)

## 2021-10-03 LAB — MAGNESIUM: Magnesium: 2.1 mg/dL (ref 1.5–2.5)

## 2021-10-03 NOTE — Progress Notes (Signed)
<><><><><><><><><><><><><><><><><><><><><><><><><><><><><><><><><> <><><><><><><><><><><><><><><><><><><><><><><><><><><><><><><><><> -   Test results slightly outside the reference range are not unusual. If there is anything important, I will review this with you,  otherwise it is considered normal test values.  If you have further questions,  please do not hesitate to contact me at the office or via My Chart.  <><><><><><><><><><><><><><><><><><><><><><><><><><><><><><><><><> <><><><><><><><><><><><><><><><><><><><><><><><><><><><><><><><><>  -  A1c is better - down to 5.7 % - almost back to normal                                      - Keep up the Good work with diet <><><><><><><><><><><><><><><><><><><><><><><><><><><><><><><><><>  - Total Chol = 180     &    LD Chol = 90  - Both Excellent   - Very low risk for Heart Attack  / Stroke <><><><><><><><><><><><><><><><><><><><><><><><><><><><><><><><><>  -  Vitamin D = 60 - Great - Please keep dose same  <><><><><><><><><><><><><><><><><><><><><><><><><><><><><><><><><> <><><><><><><><><><><><><><><><><><><><><><><><><><><><><><><><><>  - Uric Acid / Gout test is Normal - Please continue Allopurinol same <><><><><><><><><><><><><><><><><><><><><><><><><><><><><><><><><> <><><><><><><><><><><><><><><><><><><><><><><><><><><><><><><><><>  -  All Else - CBC - Kidneys - Electrolytes - Liver - Magnesium & Thyroid    - all  Normal / OK <><><><><><><><><><><><><><><><><><><><><><><><><><><><><><><><><> <><><><><><><><><><><><><><><><><><><><><><><><><><><><><><><><><>

## 2021-10-04 NOTE — Progress Notes (Signed)
<><><><><><><><><><><><><><><><><><><><><><><><><><><><><><><><><> <><><><><><><><><><><><><><><><><><><><><><><><><><><><><><><><><>  -   Urine Culture returned "OK" - No Infection   - So No Antibiotics necessary  <><><><><><><><><><><><><><><><><><><><><><><><><><><><><><><><><> <><><><><><><><><><><><><><><><><><><><><><><><><><><><><><><><><>

## 2021-10-19 ENCOUNTER — Other Ambulatory Visit: Payer: Self-pay | Admitting: Internal Medicine

## 2021-10-19 DIAGNOSIS — E782 Mixed hyperlipidemia: Secondary | ICD-10-CM

## 2021-10-19 MED ORDER — GEMFIBROZIL 600 MG PO TABS: ORAL_TABLET | ORAL | 3 refills | Status: DC

## 2021-10-27 DIAGNOSIS — G4733 Obstructive sleep apnea (adult) (pediatric): Secondary | ICD-10-CM | POA: Diagnosis not present

## 2021-11-06 ENCOUNTER — Ambulatory Visit (INDEPENDENT_AMBULATORY_CARE_PROVIDER_SITE_OTHER): Payer: Medicare Other | Admitting: Nurse Practitioner

## 2021-11-06 ENCOUNTER — Encounter: Payer: Self-pay | Admitting: Nurse Practitioner

## 2021-11-06 VITALS — BP 150/82 | HR 65 | Temp 97.9°F | Ht 71.5 in | Wt 173.8 lb

## 2021-11-06 DIAGNOSIS — R300 Dysuria: Secondary | ICD-10-CM

## 2021-11-06 DIAGNOSIS — M545 Low back pain, unspecified: Secondary | ICD-10-CM

## 2021-11-06 DIAGNOSIS — N410 Acute prostatitis: Secondary | ICD-10-CM

## 2021-11-06 DIAGNOSIS — Z79899 Other long term (current) drug therapy: Secondary | ICD-10-CM

## 2021-11-06 DIAGNOSIS — Z7251 High risk heterosexual behavior: Secondary | ICD-10-CM

## 2021-11-06 DIAGNOSIS — R3912 Poor urinary stream: Secondary | ICD-10-CM

## 2021-11-06 MED ORDER — CIPROFLOXACIN HCL 250 MG PO TABS: 250.0000 mg | ORAL_TABLET | Freq: Two times a day (BID) | ORAL | 0 refills | Status: AC

## 2021-11-06 NOTE — Patient Instructions (Signed)
Urinary Tract Infection, Adult A urinary tract infection (UTI) is an infection of any part of the urinary tract. The urinary tract includes: The kidneys. The ureters. The bladder. The urethra. These organs make, store, and get rid of pee (urine) in the body. What are the causes? This infection is caused by germs (bacteria) in your genital area. These germs grow and cause swelling (inflammation) of your urinary tract. What increases the risk? The following factors may make you more likely to develop this condition: Using a small, thin tube (catheter) to drain pee. Not being able to control when you pee or poop (incontinence). Being male. If you are male, these things can increase the risk: Using these methods to prevent pregnancy: A medicine that kills sperm (spermicide). A device that blocks sperm (diaphragm). Having low levels of a male hormone (estrogen). Being pregnant. You are more likely to develop this condition if: You have genes that add to your risk. You are sexually active. You take antibiotic medicines. You have trouble peeing because of: A prostate that is bigger than normal, if you are male. A blockage in the part of your body that drains pee from the bladder. A kidney stone. A nerve condition that affects your bladder. Not getting enough to drink. Not peeing often enough. You have other conditions, such as: Diabetes. A weak disease-fighting system (immune system). Sickle cell disease. Gout. Injury of the spine. What are the signs or symptoms? Symptoms of this condition include: Needing to pee right away. Peeing small amounts often. Pain or burning when peeing. Blood in the pee. Pee that smells bad or not like normal. Trouble peeing. Pee that is cloudy. Fluid coming from the vagina, if you are male. Pain in the belly or lower back. Other symptoms include: Vomiting. Not feeling hungry. Feeling mixed up (confused). This may be the first symptom in  older adults. Being tired and grouchy (irritable). A fever. Watery poop (diarrhea). How is this treated? Taking antibiotic medicine. Taking other medicines. Drinking enough water. In some cases, you may need to see a specialist. Follow these instructions at home:  Medicines Take over-the-counter and prescription medicines only as told by your doctor. If you were prescribed an antibiotic medicine, take it as told by your doctor. Do not stop taking it even if you start to feel better. General instructions Make sure you: Pee until your bladder is empty. Do not hold pee for a long time. Empty your bladder after sex. Wipe from front to back after peeing or pooping if you are a male. Use each tissue one time when you wipe. Drink enough fluid to keep your pee pale yellow. Keep all follow-up visits. Contact a doctor if: You do not get better after 1-2 days. Your symptoms go away and then come back. Get help right away if: You have very bad back pain. You have very bad pain in your lower belly. You have a fever. You have chills. You feeling like you will vomit or you vomit. Summary A urinary tract infection (UTI) is an infection of any part of the urinary tract. This condition is caused by germs in your genital area. There are many risk factors for a UTI. Treatment includes antibiotic medicines. Drink enough fluid to keep your pee pale yellow. This information is not intended to replace advice given to you by your health care provider. Make sure you discuss any questions you have with your health care provider. Document Revised: 09/29/2019 Document Reviewed: 09/29/2019 Elsevier Patient Education    2023 [employer]  

## 2021-11-06 NOTE — Progress Notes (Signed)
Assessment and Plan:  Pranshu Lyster was seen today for an episodic visit.  Diagnoses and all order for this visit:  1. Acute prostatitis  - ciprofloxacin (CIPRO) 250 MG tablet; Take 1 tablet (250 mg total) by mouth 2 (two) times daily for 10 days.  Dispense: 20 tablet; Refill: 0 - CBC with Differential/Platelet  2. Dysuria  - ciprofloxacin (CIPRO) 250 MG tablet; Take 1 tablet (250 mg total) by mouth 2 (two) times daily for 10 days.  Dispense: 20 tablet; Refill: 0 - RPR - HSV(herpes simplex vrs) 1+2 ab-IgG - C. trachomatis/N. gonorrhoeae RNA - Urinalysis, Routine w reflex microscopic - Urine Culture  3. Weak urinary stream  - ciprofloxacin (CIPRO) 250 MG tablet; Take 1 tablet (250 mg total) by mouth 2 (two) times daily for 10 days.  Dispense: 20 tablet; Refill: 0 - PSA - Urinalysis, Routine w reflex microscopic - Urine Culture  4. Acute right-sided low back pain without sciatica  - ciprofloxacin (CIPRO) 250 MG tablet; Take 1 tablet (250 mg total) by mouth 2 (two) times daily for 10 days.  Dispense: 20 tablet; Refill: 0 - COMPLETE METABOLIC PANEL WITH GFR  5. Medication management  - CBC with Differential/Platelet - COMPLETE METABOLIC PANEL WITH GFR - PSA - Urinalysis, Routine w reflex microscopic - Urine Culture  6. High risk heterosexual behavior Discussed safe sex practices.  - RPR - HSV(herpes simplex vrs) 1+2 ab-IgG - C. trachomatis/N. gonorrhoeae RNA - HIV antibody (with reflex)    Continue to monitor for any increase in fever, chills, N/V,blood in urine, uncontrolled abdominal pain, rash.  Notify office for further evaluation and treatment, questions or concerns if s/s fail to improve. The risks and benefits of my recommendations, as well as other treatment options were discussed with the patient today. Questions were answered.  Further disposition pending results of labs. Discussed med's effects and SE's.    Over 20 minutes of exam, counseling, chart  review, and critical decision making was performed.   Future Appointments  Date Time Provider Department Center  01/05/2022  3:30 PM Adela Glimpse, NP [facility] None  04/01/2022 10:00 AM [name], MD [facility] None  07/02/2022  4:00 PM Raynelle Dick, NP [facility] None    ------------------------------------------------------------------------------------------------------------------   HPI BP (!) 150/82   Pulse 65   Temp 97.9 F (36.6 C)   Ht 5' 11.5" (1.816 m)   Wt 173 lb 12.8 oz (78.8 kg)   SpO2 97%   BMI 23.90 kg/m    Patient complains of burning with urination, foul smelling urine, frequency, hesitancy, suprapubic pressure, and urgency. He has had symptoms for 1  month . Patient also complains of back pain. Patient denies fever and penile discharge . Patient does not have a history of recurrent UTI. Patient does not have a history of pyelonephritis. He was sexually active, unprotected, with a male one month ago , which is onset of symptoms.     Past Medical History:  Diagnosis Date   Gout    Hyperlipidemia    Hypertension    Prediabetes      Allergies  Allergen Reactions   Ace Inhibitors Cough   Lipitor [Atorvastatin]     Elevates CPK and Aldolase    Current Outpatient Medications on File Prior to Visit  Medication Sig   allopurinol (ZYLOPRIM) 300 MG tablet TAKE 1 TABLET BY MOUTH  DAILY FOR GOUT PREVENTION (Patient taking differently: TAKE 1/2 TABLET BY MOUTH  DAILY FOR GOUT PREVENTION)   Cholecalciferol (VITAMIN D PO)  Take 5,000 Int'l Units by mouth daily.   clonazePAM (KLONOPIN) 2 MG tablet TAKE 1/2 TO 1 TABLET BY  MOUTH AT BEDTIME ONLY IF  NEEDED FOR SLEEP & LIMIT TO 5 DAYS /WEEK TO AVOID  ADDICTION & DEMENTIA   ezetimibe (ZETIA) 10 MG tablet TAKE 1 TABLET BY MOUTH  DAILY FOR CHOLESTEROL   famotidine (PEPCID) 20 MG tablet TAKE 1 TABLET 30 MIN PRIOR TO BREAKFAST AND BEDTIME AS NEEDED FOR HEARTBURN/INDIGESTION   gabapentin (NEURONTIN) 100 MG  capsule Take 1 capsule (100 mg total) by mouth at bedtime.   gemfibrozil (LOPID) 600 MG tablet Take  1 tablet   2 x /day  with Meals  for Cholesterol        ( Dx: e78.2 )                      /                         TAKE                                  BY                         MOUTH   oxybutynin (DITROPAN-XL) 10 MG 24 hr tablet TAKE 1 TABLET BY MOUTH EVERY DAY   traZODone (DESYREL) 150 MG tablet TAKE 1 TABLET BY MOUTH 1  HOUR BEFORE BEDTIME AS  NEEDED FOR SLEEP   triamcinolone (NASACORT) 55 MCG/ACT AERO nasal inhaler Place 2 sprays into the nose at bedtime.   No current facility-administered medications on file prior to visit.    ROS: all negative except what is noted in the HPI.   Physical Exam:  BP (!) 150/82   Pulse 65   Temp 97.9 F (36.6 C)   Ht 5' 11.5" (1.816 m)   Wt 173 lb 12.8 oz (78.8 kg)   SpO2 97%   BMI 23.90 kg/m   General Appearance: NAD.  Awake, conversant and cooperative. Eyes: PERRLA, EOMs intact.  Sclera white.  Conjunctiva without erythema. Sinuses: No frontal/maxillary tenderness.  No nasal discharge. Nares patent.  ENT/Mouth: Ext aud canals clear.  Bilateral TMs w/DOL and without erythema or bulging. Hearing intact.  Posterior pharynx without swelling or exudate.  Tonsils without swelling or erythema.  Neck: Supple.  No masses, nodules or thyromegaly. Respiratory: Effort is regular with non-labored breathing. Breath sounds are equal bilaterally without rales, rhonchi, wheezing or stridor.  Cardio: RRR with no MRGs. Brisk peripheral pulses without edema.  Abdomen: Active BS in all four quadrants.  Soft and non-tender without guarding, rebound tenderness, hernias or masses. Lymphatics: Non tender without lymphadenopathy.  Musculoskeletal: Full ROM, 5/5 strength, normal ambulation.  No clubbing or cyanosis. Skin: Appropriate color for ethnicity. Warm without rashes, lesions, ecchymosis, ulcers.  Neuro: CN II-XII grossly normal. Normal muscle tone without  cerebellar symptoms and intact sensation.   Psych: AO X 3,  appropriate mood and affect, insight and judgment.     Adela Glimpse, NP 11:02 AM [facility]

## 2021-11-07 LAB — COMPLETE METABOLIC PANEL WITH GFR
AG Ratio: 1.4 (calc) (ref 1.0–2.5)
ALT: 12 U/L (ref 9–46)
AST: 27 U/L (ref 10–35)
Albumin: 4.4 g/dL (ref 3.6–5.1)
Alkaline phosphatase (APISO): 66 U/L (ref 35–144)
BUN: 15 mg/dL (ref 7–25)
CO2: 28 mmol/L (ref 20–32)
Calcium: 9.6 mg/dL (ref 8.6–10.3)
Chloride: 101 mmol/L (ref 98–110)
Creat: 0.97 mg/dL (ref 0.70–1.22)
Globulin: 3.2 g/dL (calc) (ref 1.9–3.7)
Glucose, Bld: 80 mg/dL (ref 65–99)
Potassium: 4.5 mmol/L (ref 3.5–5.3)
Sodium: 137 mmol/L (ref 135–146)
Total Bilirubin: 0.4 mg/dL (ref 0.2–1.2)
Total Protein: 7.6 g/dL (ref 6.1–8.1)
eGFR: 77 mL/min/{1.73_m2} (ref >=60)

## 2021-11-07 LAB — CBC WITH DIFFERENTIAL/PLATELET
Absolute Monocytes: 541 cells/uL (ref 200–950)
Basophils Absolute: 32 cells/uL (ref 0–200)
Basophils Relative: 0.6 %
Eosinophils Absolute: 382 cells/uL (ref 15–500)
Eosinophils Relative: 7.2 %
HCT: 44 % (ref 38.5–50.0)
Hemoglobin: 14.7 g/dL (ref 13.2–17.1)
Lymphs Abs: 2364 cells/uL (ref 850–3900)
MCH: 30.9 pg (ref 27.0–33.0)
MCHC: 33.4 g/dL (ref 32.0–36.0)
MCV: 92.4 fL (ref 80.0–100.0)
MPV: 9.9 fL (ref 7.5–12.5)
Monocytes Relative: 10.2 %
Neutro Abs: 1982 cells/uL (ref 1500–7800)
Neutrophils Relative %: 37.4 %
Platelets: 243 10*3/uL (ref 140–400)
RBC: 4.76 10*6/uL (ref 4.20–5.80)
RDW: 13 % (ref 11.0–15.0)
Total Lymphocyte: 44.6 %
WBC: 5.3 10*3/uL (ref 3.8–10.8)

## 2021-11-07 LAB — URINE CULTURE
MICRO NUMBER:: 13885958
Result:: NO GROWTH
SPECIMEN QUALITY:: ADEQUATE

## 2021-11-07 LAB — PSA: PSA: 3.47 ng/mL (ref <=4.00)

## 2021-11-07 LAB — URINALYSIS, ROUTINE W REFLEX MICROSCOPIC
Bilirubin Urine: NEGATIVE
Glucose, UA: NEGATIVE
Hgb urine dipstick: NEGATIVE
Ketones, ur: NEGATIVE
Leukocytes,Ua: NEGATIVE
Nitrite: NEGATIVE
Protein, ur: NEGATIVE
Specific Gravity, Urine: 1.005 (ref 1.001–1.035)
pH: 7.5 (ref 5.0–8.0)

## 2021-11-07 LAB — RPR: RPR Ser Ql: NONREACTIVE

## 2021-11-07 LAB — HSV(HERPES SIMPLEX VRS) I + II AB-IGG
HAV 1 IGG,TYPE SPECIFIC AB: 40.5 index — ABNORMAL HIGH
HSV 2 IGG,TYPE SPECIFIC AB: 2.4 index — ABNORMAL HIGH

## 2021-11-07 LAB — HIV ANTIBODY (ROUTINE TESTING W REFLEX): HIV 1&2 Ab, 4th Generation: NONREACTIVE

## 2021-11-07 LAB — C. TRACHOMATIS/N. GONORRHOEAE RNA
C. trachomatis RNA, TMA: NOT DETECTED
N. gonorrhoeae RNA, TMA: NOT DETECTED

## 2021-11-09 ENCOUNTER — Other Ambulatory Visit: Payer: Self-pay | Admitting: Nurse Practitioner

## 2021-11-09 MED ORDER — VALACYCLOVIR HCL 1 G PO TABS: 1000.0000 mg | ORAL_TABLET | Freq: Every day | ORAL | 3 refills | Status: DC

## 2021-12-02 ENCOUNTER — Ambulatory Visit: Payer: Medicare Other | Admitting: Nurse Practitioner

## 2021-12-02 NOTE — Progress Notes (Deleted)
Assessment and Plan:  Hrithik Boschee was seen today for an episodic visit.  Diagnoses and all order for this visit:  1. Acute prostatitis  - ciprofloxacin (CIPRO) 250 MG tablet; Take 1 tablet (250 mg total) by mouth 2 (two) times daily for 10 days.  Dispense: 20 tablet; Refill: 0 - CBC with Differential/Platelet  2. Dysuria  - ciprofloxacin (CIPRO) 250 MG tablet; Take 1 tablet (250 mg total) by mouth 2 (two) times daily for 10 days.  Dispense: 20 tablet; Refill: 0 - RPR - HSV(herpes simplex vrs) 1+2 ab-IgG - C. trachomatis/N. gonorrhoeae RNA - Urinalysis, Routine w reflex microscopic - Urine Culture  3. Weak urinary stream  - ciprofloxacin (CIPRO) 250 MG tablet; Take 1 tablet (250 mg total) by mouth 2 (two) times daily for 10 days.  Dispense: 20 tablet; Refill: 0 - PSA - Urinalysis, Routine w reflex microscopic - Urine Culture  4. Acute right-sided low back pain without sciatica  - ciprofloxacin (CIPRO) 250 MG tablet; Take 1 tablet (250 mg total) by mouth 2 (two) times daily for 10 days.  Dispense: 20 tablet; Refill: 0 - COMPLETE METABOLIC PANEL WITH GFR  5. Medication management  - CBC with Differential/Platelet - COMPLETE METABOLIC PANEL WITH GFR - PSA - Urinalysis, Routine w reflex microscopic - Urine Culture  6. High risk heterosexual behavior Discussed safe sex practices.  - RPR - HSV(herpes simplex vrs) 1+2 ab-IgG - C. trachomatis/N. gonorrhoeae RNA - HIV antibody (with reflex)    Continue to monitor for any increase in fever, chills, N/V,blood in urine, uncontrolled abdominal pain, rash.  Notify office for further evaluation and treatment, questions or concerns if s/s fail to improve. The risks and benefits of my recommendations, as well as other treatment options were discussed with the patient today. Questions were answered.  Further disposition pending results of labs. Discussed med's effects and SE's.    Over 20 minutes of exam, counseling, chart  review, and critical decision making was performed.   Future Appointments  Date Time Provider Department Center  12/02/2021 11:00 AM Adela Glimpse, NP [facility] None  01/05/2022  3:30 PM Adela Glimpse, NP [facility] None  04/01/2022 10:00 AM [name], MD [facility] None  07/02/2022  4:00 PM Raynelle Dick, NP [facility] None    ------------------------------------------------------------------------------------------------------------------   HPI There were no vitals taken for this visit.   Patient complains of burning with urination, foul smelling urine, frequency, hesitancy, suprapubic pressure, and urgency. He has had symptoms for 1  month . Patient also complains of back pain. Patient denies fever and penile discharge . Patient does not have a history of recurrent UTI. Patient does not have a history of pyelonephritis. He was sexually active, unprotected, with a male one month ago , which is onset of symptoms.     Past Medical History:  Diagnosis Date   Gout    Hyperlipidemia    Hypertension    Prediabetes      Allergies  Allergen Reactions   Ace Inhibitors Cough   Lipitor [Atorvastatin]     Elevates CPK and Aldolase    Current Outpatient Medications on File Prior to Visit  Medication Sig   allopurinol (ZYLOPRIM) 300 MG tablet TAKE 1 TABLET BY MOUTH  DAILY FOR GOUT PREVENTION (Patient taking differently: TAKE 1/2 TABLET BY MOUTH  DAILY FOR GOUT PREVENTION)   Cholecalciferol (VITAMIN D PO) Take 5,000 Int'l Units by mouth daily.   clonazePAM (KLONOPIN) 2 MG tablet TAKE 1/2 TO 1 TABLET BY  MOUTH AT  BEDTIME ONLY IF  NEEDED FOR SLEEP & LIMIT TO 5 DAYS /WEEK TO AVOID  ADDICTION & DEMENTIA   ezetimibe (ZETIA) 10 MG tablet TAKE 1 TABLET BY MOUTH  DAILY FOR CHOLESTEROL   famotidine (PEPCID) 20 MG tablet TAKE 1 TABLET 30 MIN PRIOR TO BREAKFAST AND BEDTIME AS NEEDED FOR HEARTBURN/INDIGESTION   gabapentin (NEURONTIN) 100 MG capsule Take 1 capsule (100 mg total) by mouth  at bedtime.   gemfibrozil (LOPID) 600 MG tablet Take  1 tablet   2 x /day  with Meals  for Cholesterol        ( Dx: e78.2 )                      /                         TAKE                                  BY                         MOUTH   oxybutynin (DITROPAN-XL) 10 MG 24 hr tablet TAKE 1 TABLET BY MOUTH EVERY DAY   traZODone (DESYREL) 150 MG tablet TAKE 1 TABLET BY MOUTH 1  HOUR BEFORE BEDTIME AS  NEEDED FOR SLEEP   triamcinolone (NASACORT) 55 MCG/ACT AERO nasal inhaler Place 2 sprays into the nose at bedtime.   valACYclovir (VALTREX) 1000 MG tablet Take 1 tablet (1,000 mg total) by mouth daily.   No current facility-administered medications on file prior to visit.    ROS: all negative except what is noted in the HPI.   Physical Exam:  There were no vitals taken for this visit.  General Appearance: NAD.  Awake, conversant and cooperative. Eyes: PERRLA, EOMs intact.  Sclera white.  Conjunctiva without erythema. Sinuses: No frontal/maxillary tenderness.  No nasal discharge. Nares patent.  ENT/Mouth: Ext aud canals clear.  Bilateral TMs w/DOL and without erythema or bulging. Hearing intact.  Posterior pharynx without swelling or exudate.  Tonsils without swelling or erythema.  Neck: Supple.  No masses, nodules or thyromegaly. Respiratory: Effort is regular with non-labored breathing. Breath sounds are equal bilaterally without rales, rhonchi, wheezing or stridor.  Cardio: RRR with no MRGs. Brisk peripheral pulses without edema.  Abdomen: Active BS in all four quadrants.  Soft and non-tender without guarding, rebound tenderness, hernias or masses. Lymphatics: Non tender without lymphadenopathy.  Musculoskeletal: Full ROM, 5/5 strength, normal ambulation.  No clubbing or cyanosis. Skin: Appropriate color for ethnicity. Warm without rashes, lesions, ecchymosis, ulcers.  Neuro: CN II-XII grossly normal. Normal muscle tone without cerebellar symptoms and intact sensation.   Psych: AO X 3,   appropriate mood and affect, insight and judgment.     Darrol Jump, NP 8:49 AM [facility]

## 2022-01-05 ENCOUNTER — Ambulatory Visit: Payer: Medicare Other | Admitting: Nurse Practitioner

## 2022-01-05 DIAGNOSIS — I10 Essential (primary) hypertension: Secondary | ICD-10-CM

## 2022-01-14 ENCOUNTER — Ambulatory Visit (INDEPENDENT_AMBULATORY_CARE_PROVIDER_SITE_OTHER): Payer: Medicare Other | Admitting: Nurse Practitioner

## 2022-01-14 VITALS — BP 158/88 | HR 82 | Temp 97.5°F | Ht 71.5 in | Wt 178.2 lb

## 2022-01-14 DIAGNOSIS — G4733 Obstructive sleep apnea (adult) (pediatric): Secondary | ICD-10-CM

## 2022-01-14 DIAGNOSIS — Z23 Encounter for immunization: Secondary | ICD-10-CM

## 2022-01-14 DIAGNOSIS — E782 Mixed hyperlipidemia: Secondary | ICD-10-CM

## 2022-01-14 DIAGNOSIS — M1000 Idiopathic gout, unspecified site: Secondary | ICD-10-CM

## 2022-01-14 DIAGNOSIS — K219 Gastro-esophageal reflux disease without esophagitis: Secondary | ICD-10-CM

## 2022-01-14 DIAGNOSIS — I10 Essential (primary) hypertension: Secondary | ICD-10-CM | POA: Diagnosis not present

## 2022-01-14 DIAGNOSIS — G4700 Insomnia, unspecified: Secondary | ICD-10-CM

## 2022-01-14 DIAGNOSIS — R7309 Other abnormal glucose: Secondary | ICD-10-CM

## 2022-01-14 DIAGNOSIS — G629 Polyneuropathy, unspecified: Secondary | ICD-10-CM | POA: Diagnosis not present

## 2022-01-14 DIAGNOSIS — Z1159 Encounter for screening for other viral diseases: Secondary | ICD-10-CM

## 2022-01-14 NOTE — Patient Instructions (Signed)

## 2022-01-14 NOTE — Progress Notes (Signed)
FOLLOW UP Assessment:    Essential hypertension Discussed DASH (Dietary Approaches to Stop Hypertension) DASH diet is lower in sodium than a typical American diet. Cut back on foods that are high in saturated fat, cholesterol, and trans fats. Eat more whole-grain foods, fish, poultry, and nuts Remain active and exercise as tolerated daily.  Monitor BP at home-Call if greater than 130/80.   OSA on CPAP Continue CPAP  Mixed hyperlipidemia, statin intolerance Discussed lifestyle modifications. Recommended diet heavy in fruits and veggies, omega 3's. Decrease consumption of animal meats, cheeses, and dairy products. Remain active and exercise as tolerated. Continue to monitor.  Abnormal glucose Education: Reviewed 'ABCs' of diabetes management  Discussed goals to be met and/or maintained include A1C (<7) Blood pressure (<130/80) Cholesterol (LDL <70) Continue Eye Exam yearly  Continue Dental Exam Q6 mo Discussed dietary recommendations Discussed Physical Activity recommendations  Medication management All medications discussed and reviewed in full. All questions and concerns regarding medications addressed.    Idiopathic gout, unspecified chronicity, unspecified site No recent flare Discussed low purine diet Monitor Uric Acid  Gastroesophageal reflux disease with esophagitis No suspected reflux complications (Barret/stricture). Lifestyle modification:  wt loss, avoid meals 2-3h before bedtime. Consider eliminating food triggers:  chocolate, caffeine, EtOH, acid/spicy food.  Insomnia Continue trazodone daily, klonopin PRN, doing well limiting use Monitor closely   Neuropathy Monitor symptoms Continue Gabapentin 100 mg QHS  Need for flu vaccine Administered Patient tolerated well  HSV II Continue Valtrex Practice safe sex  Orders Placed This Encounter  Procedures   Flu vaccine HIGH DOSE PF (Fluzone High dose)    Notify office for further evaluation and  treatment, questions or concerns if any reported s/s fail to improve.   The patient was advised to call back or seek an in-person evaluation if any symptoms worsen or if the condition fails to improve as anticipated.   Further disposition pending results of labs. Discussed med's effects and SE's.    I discussed the assessment and treatment plan with the patient. The patient was provided an opportunity to ask questions and all were answered. The patient agreed with the plan and demonstrated an understanding of the instructions.  Discussed med's effects and SE's. Screening labs and tests as requested with regular follow-up as recommended.  I provided 20 minutes of face-to-face time during this encounter including counseling, chart review, and critical decision making was preformed.   Future Appointments  Date Time Provider [place]  04/21/2022 10:00 AM Unk Pinto, MD [facility] None  07/22/2022  3:30 PM Alycia Rossetti, NP [facility] None    Subjective:  Jeremy Boone is a AA 87 y.o. male who presents for Medicare Annual Wellness Visit and 3 month follow up for HTN, hyperlipidemia, prediabetes, and vitamin D Def.    Wife has dementia, he is primary caregiver. Goes to [employer] during the day.   Was recently diagnosed with HSV II.  Continuing tmt with Valacyclovir.   He has CPAP machine, reports is wearing without difficulty, endorses restorative sleep. He is on trazodone daily and klonopin as needed for insomnia, will take occasionally if unable to fall back asleep after waking up, takes 1/2 tab only. Admits to using sparingly.  He reports GERD symptoms are currently controlled with Famotidine.  Tries to avoid triggers.  Has hx of neuropathy and notices coldness more on left foot. He had previous ABI in 2019 that was normal.  Takes Gabapentin with some relief.  He was having urinary urgency and mild incontinence, reports  improved with oxybutynin but does not take  regularly.  Continues to follow with Urology.  BMI is Body mass index is 24.51 kg/m., he has been working on diet . Wt Readings from Last 3 Encounters:  01/14/22 178 lb 3.2 oz (80.8 kg)  11/06/21 173 lb 12.8 oz (78.8 kg)  10/02/21 180 lb (81.6 kg)   His blood pressure has been controlled at home, today their BP is BP: (!) 158/88,  BP Readings from Last 3 Encounters:  01/14/22 (!) 158/88  11/06/21 (!) 150/82  10/02/21 136/78  He does workout. He denies chest pain, dyspnea, dizziness.    He is on cholesterol medication, lopid 1200 once daily due to intolerance of statins due to elevated CPK/aldolase and denies myalgias. His cholesterol is not at goal. The cholesterol last visit was:   Lab Results  Component Value Date   CHOL 180 10/02/2021   HDL 74 10/02/2021   LDLCALC 90 10/02/2021   TRIG 70 10/02/2021   CHOLHDL 2.4 10/02/2021   He has been working on diet and exercise for prediabetes, and denies paresthesia of the feet, polydipsia, polyuria and visual disturbances. Last A1C in the office was:  Lab Results  Component Value Date   HGBA1C 5.7 (H) 10/02/2021   Patient is on Vitamin D supplement.   Lab Results  Component Value Date   VD25OH 60 10/02/2021    Patient is on allopurinol (150 mg only) for gout and does not report a recent flare. Lab Results  Component Value Date   LABURIC 5.6 10/02/2021      Medication Review: Current Outpatient Medications on File Prior to Visit  Medication Sig Dispense Refill   allopurinol (ZYLOPRIM) 300 MG tablet TAKE 1 TABLET BY MOUTH  DAILY FOR GOUT PREVENTION (Patient taking differently: TAKE 1/2 TABLET BY MOUTH  DAILY FOR GOUT PREVENTION) 90 tablet 3   Cholecalciferol (VITAMIN D PO) Take 5,000 Int'l Units by mouth daily.     clonazePAM (KLONOPIN) 2 MG tablet TAKE 1/2 TO 1 TABLET BY  MOUTH AT BEDTIME ONLY IF  NEEDED FOR SLEEP & LIMIT TO 5 DAYS /WEEK TO AVOID  ADDICTION & DEMENTIA 30 tablet 0   ezetimibe (ZETIA) 10 MG tablet TAKE 1  TABLET BY MOUTH  DAILY FOR CHOLESTEROL 90 tablet 3   famotidine (PEPCID) 20 MG tablet TAKE 1 TABLET 30 MIN PRIOR TO BREAKFAST AND BEDTIME AS NEEDED FOR HEARTBURN/INDIGESTION 180 tablet 3   gabapentin (NEURONTIN) 100 MG capsule Take 1 capsule (100 mg total) by mouth at bedtime. 90 capsule 2   gemfibrozil (LOPID) 600 MG tablet Take  1 tablet   2 x /day  with Meals  for Cholesterol        ( Dx: e78.2 )                      /                         TAKE                                  BY                         MOUTH 180 tablet 3   oxybutynin (DITROPAN-XL) 10 MG 24 hr tablet TAKE 1 TABLET BY MOUTH EVERY DAY 90 tablet 1   traZODone (DESYREL)  150 MG tablet TAKE 1 TABLET BY MOUTH 1  HOUR BEFORE BEDTIME AS  NEEDED FOR SLEEP 90 tablet 3   triamcinolone (NASACORT) 55 MCG/ACT AERO nasal inhaler Place 2 sprays into the nose at bedtime. 3 each 1   valACYclovir (VALTREX) 1000 MG tablet Take 1 tablet (1,000 mg total) by mouth daily. 90 tablet 3   No current facility-administered medications on file prior to visit.    Current Problems (verified) Patient Active Problem List   Diagnosis Date Noted   Insomnia 06/27/2020   Statin myopathy 11/21/2019   Constipation 12/15/2017   OSA on CPAP 04/19/2015   Vitamin D deficiency 09/14/2013   Medication management 09/14/2013   History of colonic polyps 08/02/2013   Gastroesophageal reflux disease without esophagitis 08/02/2013   Hyperlipidemia, mixed    Essential hypertension    Idiopathic gout    Abnormal glucose     Screening Tests Immunization History  Administered Date(s) Administered   Influenza, High Dose Seasonal PF 12/18/2013, 10/28/2015, 12/10/2016, 01/26/2018, 11/10/2018, 12/24/2020, 01/14/2022   Influenza,inj,Quad PF,6+ Mos 03/07/2013   Moderna Sars-Covid-2 Vaccination 03/14/2019, 04/11/2019, 02/04/2020   PNEUMOCOCCAL CONJUGATE-20 07/01/2021   Pneumococcal Conjugate-13 03/28/2014   Pneumococcal-Unspecified 03/03/2007   Td 03/02/2004   Tdap  10/25/2014   Zoster, Live 08/26/2016    Allergies Allergies  Allergen Reactions   Ace Inhibitors Cough   Lipitor [Atorvastatin]     Elevates CPK and Aldolase    SURGICAL HISTORY He  has a past surgical history that includes Carpal tunnel release (Left, 2007); Rotator cuff repair (Left, 2000); Other surgical history (2007); Cardiac Event Monitor (May-June 2017); and Cataract extraction, bilateral. FAMILY HISTORY His family history includes Hypertension in his mother; Seizures in his sister; Stroke in his father. SOCIAL HISTORY He  reports that he has never smoked. He has never used smokeless tobacco. He reports current alcohol use of about 15.0 standard drinks of alcohol per week. He reports that he does not use drugs.   Objective:   Blood pressure (!) 158/88, pulse 82, temperature (!) 97.5 F (36.4 C), height 5' 11.5" (1.816 m), weight 178 lb 3.2 oz (80.8 kg), SpO2 99 %. Body mass index is 24.51 kg/m.  General appearance: alert, no distress, WD/WN, male HEENT: normocephalic, sclerae anicteric, TMs pearly, nares patent, no discharge or erythema, pharynx normal Oral cavity: MMM, no lesions Neck: supple, no lymphadenopathy, no thyromegaly, no masses Heart: RRR, normal S1, S2, 2/6 rumbling early systolic murmur without radiation Lungs: CTA bilaterally, no wheezes, rhonchi, or rales Abdomen: +bs, soft, mild epigastric tenderness, non distended, no masses, no hepatomegaly, no splenomegaly Musculoskeletal: nontender, no swelling, no obvious deformity Extremities: no edema, no cyanosis, no clubbing Pulses: 2+ symmetric, upper and lower extremities, normal cap refill Neurological: alert, oriented x 3, CN2-12 intact, strength normal upper extremities and lower extremities, sensation normal throughout except left leg decrease sensation in L5/S1 distribution,, DTRs 2+ throughout, no cerebellar signs, gait normal Psychiatric: normal affect, behavior normal, pleasant   Eliette Drumwright,  NP   01/14/2022

## 2022-01-30 DIAGNOSIS — G4733 Obstructive sleep apnea (adult) (pediatric): Secondary | ICD-10-CM | POA: Diagnosis not present

## 2022-02-05 ENCOUNTER — Encounter: Payer: Self-pay | Admitting: Internal Medicine

## 2022-02-05 ENCOUNTER — Ambulatory Visit (INDEPENDENT_AMBULATORY_CARE_PROVIDER_SITE_OTHER): Payer: Medicare Other | Admitting: Internal Medicine

## 2022-02-05 VITALS — BP 126/70 | HR 69 | Temp 97.9°F | Resp 16 | Ht 71.5 in | Wt 179.2 lb

## 2022-02-05 DIAGNOSIS — M7672 Peroneal tendinitis, left leg: Secondary | ICD-10-CM | POA: Diagnosis not present

## 2022-02-05 MED ORDER — DEXAMETHASONE 4 MG PO TABS: ORAL_TABLET | ORAL | 0 refills | Status: DC

## 2022-02-05 NOTE — Progress Notes (Signed)
Future Appointments  Date Time Provider Department  02/05/2022 11:45 AM [name], MD [facility]  04/21/2022 10:00 AM [name], MD [facility]  07/22/2022  3:30 PM Raynelle Dick, NP [facility]    History of Present Illness:         This very nice 87 y.o. MBM  with HTN, HLD, Pre-Diabetes,  OSA /CPAP,  Gout  and Vitamin D Deficiency presents with symptoms of a tender area of the volar  left mid forearm for several weeks exacerbating with elbow & wrist ROM. Was severe pain yesterday, but better today.   Medications   Current Outpatient Medications (Cardiovascular):    ezetimibe (ZETIA) 10 MG tablet, TAKE 1 TABLET BY MOUTH  DAILY FOR CHOLESTEROL   gemfibrozil (LOPID) 600 MG tablet, Take  1 tablet   2 x /day  with Meals  for Cholesterol        ( Dx: e78.2 )                      /                         TAKE                                  BY                         MOUTH  Current Outpatient Medications (Respiratory):    triamcinolone (NASACORT) 55 MCG/ACT AERO nasal inhaler, Place 2 sprays into the nose at bedtime.  Current Outpatient Medications (Analgesics):    allopurinol (ZYLOPRIM) 300 MG tablet, TAKE 1 TABLET BY MOUTH  DAILY FOR GOUT PREVENTION (Patient taking differently: TAKE 1/2 TABLET BY MOUTH  DAILY FOR GOUT PREVENTION)   Current Outpatient Medications (Other):    Cholecalciferol (VITAMIN D PO), Take 5,000 Int'l Units by mouth daily.   clonazePAM (KLONOPIN) 2 MG tablet, TAKE 1/2 TO 1 TABLET BY  MOUTH AT BEDTIME ONLY IF  NEEDED FOR SLEEP & LIMIT TO 5 DAYS /WEEK TO AVOID  ADDICTION & DEMENTIA   famotidine (PEPCID) 20 MG tablet, TAKE 1 TABLET 30 MIN PRIOR TO BREAKFAST AND BEDTIME AS NEEDED FOR HEARTBURN/INDIGESTION   gabapentin (NEURONTIN) 100 MG capsule, Take 1 capsule (100 mg total) by mouth at bedtime.   oxybutynin (DITROPAN-XL) 10 MG 24 hr tablet, TAKE 1 TABLET BY MOUTH EVERY DAY   traZODone (DESYREL) 150 MG tablet, TAKE 1 TABLET BY MOUTH 1  HOUR BEFORE  BEDTIME AS  NEEDED FOR SLEEP   valACYclovir (VALTREX) 1000 MG tablet, Take 1 tablet (1,000 mg total) by mouth daily.  Problem list He has Hyperlipidemia, mixed; Essential hypertension; Idiopathic gout; Abnormal glucose; History of colonic polyps; Gastroesophageal reflux disease without esophagitis; Vitamin D deficiency; Medication management; OSA on CPAP; Constipation; Statin myopathy; and Insomnia on their problem list.   Observations/Objective:  There were no vitals taken for this visit.  HEENT - WNL. Neck - supple.  Chest - Clear equal BS. Cor - Nl HS. RRR w/o sig M. MS- FROM w/o deformities.  Gait Nl.        (+)  tender area of the volar  left mid forearm  w/o induration or fluctuance.   Neuro -  Nl w/o focal abnormalities.   Assessment and Plan:      Follow Up Instructions:  I discussed the assessment and treatment plan with the patient. The patient was provided an opportunity to ask questions and all were answered. The patient agreed with the plan and demonstrated an understanding of the instructions.       The patient was advised to call back or seek an in-person evaluation if the symptoms worsen or if the condition fails to improve as anticipated.    Kirtland Bouchard, MD

## 2022-02-17 ENCOUNTER — Other Ambulatory Visit: Payer: Self-pay

## 2022-02-17 MED ORDER — ALLOPURINOL 300 MG PO TABS: ORAL_TABLET | ORAL | 3 refills | Status: DC

## 2022-03-28 ENCOUNTER — Other Ambulatory Visit: Payer: Self-pay | Admitting: Nurse Practitioner

## 2022-03-28 DIAGNOSIS — G4700 Insomnia, unspecified: Secondary | ICD-10-CM

## 2022-04-01 ENCOUNTER — Encounter: Payer: Medicare Other | Admitting: Internal Medicine

## 2022-04-20 ENCOUNTER — Encounter: Payer: Self-pay | Admitting: Internal Medicine

## 2022-04-20 ENCOUNTER — Other Ambulatory Visit: Payer: Self-pay

## 2022-04-20 DIAGNOSIS — E782 Mixed hyperlipidemia: Secondary | ICD-10-CM

## 2022-04-20 MED ORDER — EZETIMIBE 10 MG PO TABS: ORAL_TABLET | ORAL | 3 refills | Status: DC

## 2022-04-20 NOTE — Patient Instructions (Signed)

## 2022-04-20 NOTE — Progress Notes (Unsigned)
Annual  Screening/Preventative Visit  & Comprehensive Evaluation & Examination   Future Appointments  Date Time Provider Department  04/21/2022 10:00 AM Unk Pinto, MD [facility]  07/22/2022  3:30 PM Alycia Rossetti, NP [facility]  05/26/2023 10:00 AM Unk Pinto, MD [facility]              This very nice 88 y.o. MBM  presents for a Screening /Preventative Visit & comprehensive evaluation and management of multiple medical co-morbidities.  Patient has been followed for HTN, HLD, Prediabetes and Vitamin D Deficiency. Patient has Gout controlled on meds.        HTN predates circa 1998 . Patient's BP has been controlled at home.  Today's BP is at goal - 128/70 . Patient denies any cardiac symptoms as chest pain, palpitations, shortness of breath, dizziness or ankle swelling.       Patient's hyperlipidemia is controlled with diet and medications. Patient denies myalgias or other medication SE's. Last lipids were at goal :  Lab Results  Component Value Date   CHOL 180 10/02/2021   HDL 74 10/02/2021   LDLCALC 90 10/02/2021   TRIG 70 10/02/2021   CHOLHDL 2.4 10/02/2021         Patient has hx/o prediabetes (A1c 6.3% /2011) and patient denies reactive hypoglycemic symptoms, visual blurring, diabetic polys or paresthesias. Last A1c was near goal :   Lab Results  Component Value Date   HGBA1C 5.7 (H) 10/02/2021         Finally, patient has history of Vitamin D Deficiency ("25" /2008) and last vitamin D was at goal :  Lab Results  Component Value Date   VD25OH 60 10/02/2021       Current Outpatient Medications  Medication Instructions   allopurinol 300 MG tablet TAKE 1 TABLET  DAILY    VITAMIN D  5,000 Units  Daily   ezetimibe (ZETIA) 10 MG tablet TAKE 1 TABLET   DAILY    famotidine (PEPCID) 20 MG tablet TAKE 1 TABLET 30 MIN PRIOR TO BREAKFAST AND BEDTIME AS NEEDED    gabapentin (NEURONTIN)0 10 mg  Daily at bedtime   gemfibrozil (LOPID) 600 MG tablet Take   1 tablet   2 x /day  with Meals     oxybutynin (DITROPAN-XL) 10 MG 24 hr tablet TAKE 1 TABLET VERY DAY   traZODone (DESYREL) 150 MG tablet TAKE 1 TABLET BY MOUTH 1 HOUR  BEFORE BEDTIME AS NEEDED FOR  SLEEP   NASACORT   nasal inhaler 2 sprays  at bedtime   valACYclovir (VALTREX) 1,000 mg  Daily      Allergies  Allergen Reactions   Ace Inhibitors Cough   Lipitor [Atorvastatin]     Elevates CPK and Aldolase    Past Medical History:  Diagnosis Date   Gout    Hyperlipidemia    Hypertension    Prediabetes      Health Maintenance  Topic Date Due   Zoster Vaccines- Shingrix (1 of 2) Never done   Pneumonia Vaccine 56+ Years old (2 - PPSV23 if available, else PCV20) 03/29/2015   COVID-19 Vaccine (4 - Booster for Moderna series) 03/31/2020   TETANUS/TDAP  10/24/2024   INFLUENZA VACCINE  Completed   HPV VACCINES  Aged Out     Immunization History  Administered Date(s) Administered   Influenza, High Dose  01/26/2018, 11/10/2018, 12/24/2020   Influenza,inj,Quad  03/07/2013   Moderna Sars-Covid-2 Vacc 03/14/2019, 04/11/2019, 02/04/2020   Pneumococcal -13 03/28/2014   Pneumococcal-23  03/03/2007   Td 03/02/2004   Tdap 10/13/2007. 10/25/2014   Zoster, Live 08/26/2016    Last Colon - 10/13/2007  - - Dr Deatra Ina - hyperplastic polyps  - No F/u recommended Due to age.  Past Surgical History:  Procedure Laterality Date   Cardiac Event Monitor  May-June 2017   Sinus rhythm with average heart rate 74.2 - no notable arrhythmias. Minimal PVCs. No PACs.   CARPAL TUNNEL RELEASE Left 2007   CATARACT EXTRACTION, BILATERAL     OTHER SURGICAL HISTORY  2007   Negative biopsy tonsil mass   ROTATOR CUFF REPAIR Left 2000     Family History  Problem Relation Age of Onset   Hypertension Mother    Stroke Father    Seizures Sister    Colon cancer Neg Hx    Stomach cancer Neg Hx    Esophageal cancer Neg Hx    Inflammatory bowel disease Neg Hx    Liver disease Neg Hx    Pancreatic cancer  Neg Hx    Rectal cancer Neg Hx      Social History   Tobacco Use   Smoking status: Never   Smokeless tobacco: Never  Vaping Use   Vaping Use: Never used  Substance Use Topics   Alcohol use: Yes    Alcohol/week: 15.0 standard drinks    Types: 15 Standard drinks or equivalent per week    Comment: every day 2-3 drimks a day    Drug use: No      ROS Constitutional: Denies fever, chills, weight loss/gain, headaches, insomnia,  night sweats or change in appetite. Does c/o fatigue. Eyes: Denies redness, blurred vision, diplopia, discharge, itchy or watery eyes.  ENT: Denies discharge, congestion, post nasal drip, epistaxis, sore throat, earache, hearing loss, dental pain, Tinnitus, Vertigo, Sinus pain or snoring.  Cardio: Denies chest pain, palpitations, irregular heartbeat, syncope, dyspnea, diaphoresis, orthopnea, PND, claudication or edema Respiratory: denies cough, dyspnea, DOE, pleurisy, hoarseness, laryngitis or wheezing.  Gastrointestinal: Denies dysphagia, heartburn, reflux, water brash, pain, cramps, nausea, vomiting, bloating, diarrhea, constipation, hematemesis, melena, hematochezia, jaundice or hemorrhoids Genitourinary: Denies dysuria, frequency, urgency, nocturia, hesitancy, discharge, hematuria or flank pain Musculoskeletal: Denies arthralgia, myalgia, stiffness, Jt. Swelling, pain, limp or strain/sprain. Denies Falls. Skin: Denies puritis, rash, hives, warts, acne, eczema or change in skin lesion Neuro: No weakness, tremor, incoordination, spasms, paresthesia or pain Psychiatric: Denies confusion, memory loss or sensory loss. Denies Depression. Endocrine: Denies change in weight, skin, hair change, nocturia, and paresthesia, diabetic polys, visual blurring or hyper / hypo glycemic episodes.  Heme/Lymph: No excessive bleeding, bruising or enlarged lymph nodes.   Physical Exam  BP 128/70   Pulse 69   Temp 97.9 F (36.6 C)   Resp 16   Ht 5' 11.5" (1.816 m)   Wt 181  lb 3.2 oz (82.2 kg)   SpO2 97%   BMI 24.92 kg/m   General Appearance: Well nourished and well groomed and in no apparent distress.  Eyes: PERRLA, EOMs, conjunctiva no swelling or erythema, normal fundi and vessels. Sinuses: No frontal/maxillary tenderness ENT/Mouth: EACs patent / TMs  nl. Nares clear without erythema, swelling, mucoid exudates. Oral hygiene is good. No erythema, swelling, or exudate. Tongue normal, non-obstructing. Tonsils not swollen or erythematous. Hearing normal.  Neck: Supple, thyroid not palpable. No bruits, nodes or JVD. Respiratory: Respiratory effort normal.  BS equal and clear bilateral without rales, rhonci, wheezing or stridor. Cardio: Heart sounds are normal with regular rate and rhythm and no murmurs, rubs  or gallops. Peripheral pulses are normal and equal bilaterally without edema. No aortic or femoral bruits. Chest: symmetric with normal excursions and percussion.  Abdomen: Soft, with Nl bowel sounds. Nontender, no guarding, rebound, hernias, masses, or organomegaly.  Lymphatics: Non tender without lymphadenopathy.  Musculoskeletal: Full ROM all peripheral extremities, joint stability, 5/5 strength, and normal gait. Skin: Warm and dry without rashes, lesions, cyanosis, clubbing or  ecchymosis.  Neuro: Cranial nerves intact, reflexes equal bilaterally. Normal muscle tone, no cerebellar symptoms. Sensation intact.  Pysch: Alert and oriented X 3 with normal affect, insight and judgment appropriate.   Assessment and Plan  1. Annual Preventative/Screening Exam    2. Essential hypertension  - EKG 12-Lead - Urinalysis, Routine w reflex microscopic - Microalbumin / creatinine urine ratio - CBC with Differential/Platelet - COMPLETE METABOLIC PANEL WITH GFR - Magnesium - TSH  3. Hyperlipidemia, mixed  - EKG 12-Lead - [place], RETROPERITNL ABD,  LTD - Lipid panel - TSH  4. Abnormal glucose  - EKG 12-Lead - [place], RETROPERITNL ABD,  LTD - Hemoglobin A1c -  Insulin, random  5. Vitamin D deficiency  - VITAMIN D 25 Hydroxy  6. OSA on CPAP   7. Idiopathic gout  - Uric acid  8. Gastroesophageal reflux disease   - CBC with Differential/Platelet  9. Screening for colorectal cancer  - POC Hemoccult Bld/Stl   10. BPH with obstruction/lower urinary tract symptoms  - PSA  11. Prostate cancer screening  - PSA  12. FHx: heart disease  - EKG 12-Lead  13. Screening for heart disease  - EKG 12-Lead - [place], RETROPERITNL ABD,  LTD  14. Screening for AAA (aortic abdominal aneurysm)  - [place], RETROPERITNL ABD,  LTD - CBC with Differential/Platelet - COMPLETE METABOLIC PANEL WITH GFR - Magnesium - Lipid panel - TSH - Hemoglobin A1c - Insulin, random - VITAMIN D 25 Hydroxy   15. Medication management  - Urinalysis, Routine w reflex microscopic - Microalbumin / creatinine urine ratio - CBC with Differential/Platelet - COMPLETE METABOLIC PANEL WITH GFR - Magnesium - Lipid panel - TSH - Hemoglobin A1c - Insulin, random - VITAMIN D 25 Hydroxy           Patient was counseled in prudent diet, weight control to achieve/maintain BMI less than 25, BP monitoring, regular exercise and medications as discussed.  Discussed med effects and SE's. Routine screening labs and tests as requested with regular follow-up as recommended. Over 40 minutes of exam, counseling, chart review and high complex critical decision making was performed   Kirtland Bouchard, MD

## 2022-04-21 ENCOUNTER — Ambulatory Visit (INDEPENDENT_AMBULATORY_CARE_PROVIDER_SITE_OTHER): Payer: Medicare Other | Admitting: Internal Medicine

## 2022-04-21 ENCOUNTER — Encounter: Payer: Self-pay | Admitting: Internal Medicine

## 2022-04-21 VITALS — BP 128/70 | HR 69 | Temp 97.9°F | Resp 16 | Ht 71.5 in | Wt 181.2 lb

## 2022-04-21 DIAGNOSIS — G4700 Insomnia, unspecified: Secondary | ICD-10-CM

## 2022-04-21 DIAGNOSIS — R7309 Other abnormal glucose: Secondary | ICD-10-CM

## 2022-04-21 DIAGNOSIS — N3281 Overactive bladder: Secondary | ICD-10-CM

## 2022-04-21 DIAGNOSIS — Z125 Encounter for screening for malignant neoplasm of prostate: Secondary | ICD-10-CM

## 2022-04-21 DIAGNOSIS — I10 Essential (primary) hypertension: Secondary | ICD-10-CM | POA: Diagnosis not present

## 2022-04-21 DIAGNOSIS — E782 Mixed hyperlipidemia: Secondary | ICD-10-CM

## 2022-04-21 DIAGNOSIS — Z8249 Family history of ischemic heart disease and other diseases of the circulatory system: Secondary | ICD-10-CM

## 2022-04-21 DIAGNOSIS — E559 Vitamin D deficiency, unspecified: Secondary | ICD-10-CM

## 2022-04-21 DIAGNOSIS — G629 Polyneuropathy, unspecified: Secondary | ICD-10-CM

## 2022-04-21 DIAGNOSIS — Z79899 Other long term (current) drug therapy: Secondary | ICD-10-CM | POA: Diagnosis not present

## 2022-04-21 DIAGNOSIS — Z0001 Encounter for general adult medical examination with abnormal findings: Secondary | ICD-10-CM

## 2022-04-21 DIAGNOSIS — G4733 Obstructive sleep apnea (adult) (pediatric): Secondary | ICD-10-CM

## 2022-04-21 DIAGNOSIS — Z136 Encounter for screening for cardiovascular disorders: Secondary | ICD-10-CM | POA: Diagnosis not present

## 2022-04-21 DIAGNOSIS — M1000 Idiopathic gout, unspecified site: Secondary | ICD-10-CM

## 2022-04-21 DIAGNOSIS — E538 Deficiency of other specified B group vitamins: Secondary | ICD-10-CM

## 2022-04-21 DIAGNOSIS — Z0000 Encounter for general adult medical examination without abnormal findings: Secondary | ICD-10-CM

## 2022-04-21 DIAGNOSIS — K219 Gastro-esophageal reflux disease without esophagitis: Secondary | ICD-10-CM

## 2022-04-21 DIAGNOSIS — N138 Other obstructive and reflux uropathy: Secondary | ICD-10-CM

## 2022-04-21 DIAGNOSIS — Z1211 Encounter for screening for malignant neoplasm of colon: Secondary | ICD-10-CM

## 2022-04-21 DIAGNOSIS — I700 Atherosclerosis of aorta: Secondary | ICD-10-CM | POA: Diagnosis not present

## 2022-04-21 MED ORDER — FAMOTIDINE 20 MG PO TABS: ORAL_TABLET | ORAL | 3 refills | Status: DC

## 2022-04-21 MED ORDER — VITAMIN B-12 1000 MCG SL SUBL: SUBLINGUAL_TABLET | SUBLINGUAL | Status: DC

## 2022-04-21 MED ORDER — GABAPENTIN 300 MG PO CAPS: ORAL_CAPSULE | ORAL | 3 refills | Status: DC

## 2022-04-21 MED ORDER — OXYBUTYNIN CHLORIDE ER 10 MG PO TB24: ORAL_TABLET | ORAL | 3 refills | Status: DC

## 2022-04-22 LAB — CBC WITH DIFFERENTIAL/PLATELET
Absolute Monocytes: 449 cells/uL (ref 200–950)
Basophils Absolute: 31 cells/uL (ref 0–200)
Basophils Relative: 0.7 %
Eosinophils Absolute: 339 cells/uL (ref 15–500)
Eosinophils Relative: 7.7 %
HCT: 41.4 % (ref 38.5–50.0)
Hemoglobin: 14 g/dL (ref 13.2–17.1)
Lymphs Abs: 2077 cells/uL (ref 850–3900)
MCH: 32.2 pg (ref 27.0–33.0)
MCHC: 33.8 g/dL (ref 32.0–36.0)
MCV: 95.2 fL (ref 80.0–100.0)
MPV: 10.3 fL (ref 7.5–12.5)
Monocytes Relative: 10.2 %
Neutro Abs: 1505 cells/uL (ref 1500–7800)
Neutrophils Relative %: 34.2 %
Platelets: 225 10*3/uL (ref 140–400)
RBC: 4.35 10*6/uL (ref 4.20–5.80)
RDW: 12.3 % (ref 11.0–15.0)
Total Lymphocyte: 47.2 %
WBC: 4.4 10*3/uL (ref 3.8–10.8)

## 2022-04-22 LAB — URINALYSIS, ROUTINE W REFLEX MICROSCOPIC
Bilirubin Urine: NEGATIVE
Glucose, UA: NEGATIVE
Hgb urine dipstick: NEGATIVE
Ketones, ur: NEGATIVE
Leukocytes,Ua: NEGATIVE
Nitrite: NEGATIVE
Protein, ur: NEGATIVE
Specific Gravity, Urine: 1.004 (ref 1.001–1.035)
pH: 7.5 (ref 5.0–8.0)

## 2022-04-22 LAB — INSULIN, RANDOM: Insulin: 6.3 u[IU]/mL

## 2022-04-22 LAB — LIPID PANEL
Cholesterol: 179 mg/dL (ref <200)
HDL: 86 mg/dL (ref >=40)
LDL Cholesterol (Calc): 79 mg/dL (calc)
Non-HDL Cholesterol (Calc): 93 mg/dL (calc) (ref <130)
Total CHOL/HDL Ratio: 2.1 (calc) (ref <5.0)
Triglycerides: 62 mg/dL (ref <150)

## 2022-04-22 LAB — MICROALBUMIN / CREATININE URINE RATIO
Creatinine, Urine: 19 mg/dL — ABNORMAL LOW (ref 20–320)
Microalb, Ur: <0.2 mg/dL

## 2022-04-22 LAB — VITAMIN D 25 HYDROXY (VIT D DEFICIENCY, FRACTURES): Vit D, 25-Hydroxy: 63 ng/mL (ref 30–100)

## 2022-04-22 LAB — COMPLETE METABOLIC PANEL WITH GFR
AG Ratio: 1.2 (calc) (ref 1.0–2.5)
ALT: 9 U/L (ref 9–46)
AST: 19 U/L (ref 10–35)
Albumin: 4.1 g/dL (ref 3.6–5.1)
Alkaline phosphatase (APISO): 54 U/L (ref 35–144)
BUN: 18 mg/dL (ref 7–25)
CO2: 26 mmol/L (ref 20–32)
Calcium: 9.3 mg/dL (ref 8.6–10.3)
Chloride: 103 mmol/L (ref 98–110)
Creat: 0.98 mg/dL (ref 0.70–1.22)
Globulin: 3.3 g/dL (calc) (ref 1.9–3.7)
Glucose, Bld: 74 mg/dL (ref 65–99)
Potassium: 4.1 mmol/L (ref 3.5–5.3)
Sodium: 137 mmol/L (ref 135–146)
Total Bilirubin: 0.4 mg/dL (ref 0.2–1.2)
Total Protein: 7.4 g/dL (ref 6.1–8.1)
eGFR: 75 mL/min/{1.73_m2} (ref >=60)

## 2022-04-22 LAB — PSA: PSA: 2.88 ng/mL (ref <=4.00)

## 2022-04-22 LAB — HEMOGLOBIN A1C
Hgb A1c MFr Bld: 6.1 % of total Hgb — ABNORMAL HIGH (ref <5.7)
Mean Plasma Glucose: 128 mg/dL
eAG (mmol/L): 7.1 mmol/L

## 2022-04-22 LAB — TSH: TSH: 1.96 mIU/L (ref 0.40–4.50)

## 2022-04-22 LAB — URIC ACID: Uric Acid, Serum: 3.7 mg/dL — ABNORMAL LOW (ref 4.0–8.0)

## 2022-04-22 LAB — MAGNESIUM: Magnesium: 2 mg/dL (ref 1.5–2.5)

## 2022-04-22 NOTE — Progress Notes (Signed)
<><><><><><><><><><><><><><><><><><><><><><><><><><><><><><><><><> <><><><><><><><><><><><><><><><><><><><><><><><><><><><><><><><><> - Test results slightly outside the reference range are not unusual. If there is anything important, I will review this with you,  otherwise it is considered normal test values.  If you have further questions,  please do not hesitate to contact me at the office or via My Chart.  <><><><><><><><><><><><><><><><><><><><><><><><><><><><><><><><><> <><><><><><><><><><><><><><><><><><><><><><><><><><><><><><><><><>  -  Uric acid  / gout test is OK - Please continue Allopurinol same  <><><><><><><><><><><><><><><><><><><><><><><><><><><><><><><><><> <><><><><><><><><><><><><><><><><><><><><><><><><><><><><><><><><>  - A1c = 6.1% Blood sugar and A1c are elevated in the borderline and                                                                                                                                                                                                                                                                                                                                                                                                                                                                      early or pre-diabetes range which has the same   300% increased risk for heart attack, stroke, cancer and   alzheimer- type vascular dementia as full blown diabetes.   But the good news is that diet, exercise with  weight loss can cure the early diabetes at this point. <><><><><><><><><><><><><><><><><><><><><><><><><><><><><><><><><> <><><><><><><><><><><><><><><><><><><><><><><><><><><><><><><><><>  - PSA is low - Great  No Prostate Cancer  -  Great !   <><><><><><><><><><><><><><><><><><><><><><><><><><><><><><><><><> <><><><><><><><><><><><><><><><><><><><><><><><><><><><><><><><><>

## 2022-04-27 ENCOUNTER — Other Ambulatory Visit: Payer: Self-pay | Admitting: Internal Medicine

## 2022-04-27 DIAGNOSIS — K219 Gastro-esophageal reflux disease without esophagitis: Secondary | ICD-10-CM

## 2022-04-27 MED ORDER — FAMOTIDINE 20 MG PO TABS: ORAL_TABLET | ORAL | 3 refills | Status: DC

## 2022-05-19 DIAGNOSIS — G4733 Obstructive sleep apnea (adult) (pediatric): Secondary | ICD-10-CM | POA: Diagnosis not present

## 2022-07-02 ENCOUNTER — Ambulatory Visit: Payer: Medicare Other | Admitting: Nurse Practitioner

## 2022-07-21 ENCOUNTER — Other Ambulatory Visit: Payer: Self-pay | Admitting: Internal Medicine

## 2022-07-21 DIAGNOSIS — E782 Mixed hyperlipidemia: Secondary | ICD-10-CM

## 2022-07-21 NOTE — Progress Notes (Unsigned)
MEDICARE ANNUAL WELLNESS VISIT AND FOLLOW UP Assessment:   Encounter for Medicare annual wellness exam 1 year Last colonoscopy 2009  Essential hypertension - continue medications, DASH diet, exercise and monitor at home. Call if greater than 130/80.  -     CBC with Differential/Platelet -     COMPLETE METABOLIC PANEL WITH GFR   OSA on CPAP Continue CPAP  Mixed hyperlipidemia, statin intolerance check lipids decrease fatty foods increase activity.  -     Lipid panel  Abnormal glucose Discussed disease progression and risks Discussed diet/exercise, weight management and risk modification   Medication management Magnesium  Vitamin D deficiency Continue supplement  History of colonic polyps Declines follow up due to age  Idiopathic gout, unspecified chronicity, unspecified site Gout- recheck Uric acid as needed Last check 04/21/22 was 3.7- low  Gastroesophageal reflux disease with esophagitis Initiated medication for new reflux related symptoms: famotidine 29 mg BID Discussed diet, avoiding triggers and other lifestyle changes Follow up if not improving - Magnesium  Insomnia Continue trazodone daily, klonopin PRN, doing well limiting use Monitor closely   Neuropathy Monitor symptoms Will try Gabapentin 100 mg QHS   Urine abnormality Will treat pending test results, encourage to push water throughout the day -     Urinalysis, Routine w reflex microscopic -     C. trachomatis/N. 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      Future Appointments  Date Time Provider Department Center  10/26/2022 10:30 AM [name], MD [facility] None  01/26/2023 10:30 AM Raynelle Dick, NP [facility] None  05/26/2023 10:00 AM [name], MD [facility] None     Plan:    During the course of the visit the patient was educated and counseled about appropriate screening and preventive services including:   Pneumococcal vaccine  Influenza vaccine Td vaccine Screening electrocardiogram Colorectal cancer screening Diabetes screening Glaucoma screening Nutrition counseling    Subjective:  Jeremy Boone is a AA 88 y.o. male who presents for Medicare Annual Wellness Visit and 3 month follow up for HTN, hyperlipidemia, abnormal glucose, OSA w CPAP, GERD, gout and vitamin D Def.   Wife has dementia, he is primary caregiver. Goes to [employer] during the day.   He has CPAP machine, reports is wearing without difficulty, endorses restorative sleep. He is on trazodone 150 mg as needed for sleep. He does use Gabapentin 300 mg 1-2 caps as needed for sleep.   He has been noticing that his urine has a foul odor and wants rechecked for UTI and , gonorrhea and chlamydia  Denies dysuria, hematuria, penis lesions and discharge.   He reports GERD symptoms are currently controlled with Famotidine.  He did test positive last year for HSV I & II and is to take Valacyclovir 1000 mg daily for prevention but has only been taking as needed.   He was having urinary urgency and mild incontinence, reports improved with oxybutynin but does not take regularly.   BMI is Body mass index is 23.68 kg/m., he has lost 9 pounds in the past 9 years, he does usually skip lunch.  Wt Readings from Last 3 Encounters:  07/22/22 172 lb 3.2 oz (78.1 kg)  04/21/22 181 lb 3.2 oz (82.2 kg)  02/05/22 179 lb 3.2 oz (81.3  kg)   His blood pressure has been controlled at home without medications, today their BP is BP: 126/72,  BP Readings from Last 3 Encounters:  07/22/22 126/72  04/21/22 128/70  02/05/22 126/70  He does workout. He denies chest pain, dyspnea, dizziness.    He is on cholesterol medication, Zetia 10 mg and Lopid 1200 once daily due to intolerance of statins due to elevated  CPK/aldolase and denies myalgias. His cholesterol is not at goal. The cholesterol last visit was:   Lab Results  Component Value Date   CHOL 179 04/21/2022   HDL 86 04/21/2022   LDLCALC 79 04/21/2022   TRIG 62 04/21/2022   CHOLHDL 2.1 04/21/2022   He has been working on diet and exercise for abnormal glucose. Last A1C in the office was:  Lab Results  Component Value Date   HGBA1C 6.1 (H) 04/21/2022   Lab Results  Component Value Date   EGFR 75 04/21/2022    Patient is on Vitamin D supplement.   Lab Results  Component Value Date   VD25OH 63 04/21/2022    Patient is on allopurinol (150 mg only) for gout and does not report a recent flare. Lab Results  Component Value Date   LABURIC 3.7 (L) 04/21/2022      Medication Review: Current Outpatient Medications on File Prior to Visit  Medication Sig Dispense Refill   ezetimibe (ZETIA) 10 MG tablet TAKE 1 TABLET BY MOUTH  DAILY FOR CHOLESTEROL 90 tablet 3   famotidine (PEPCID) 20 MG tablet Take 1 tablet  2 x / day before Breakfast & Supper to Prevent Heartburn & Indigestion 180 tablet 3   gabapentin (NEURONTIN) 300 MG capsule Take   1 to 2 capsules   1 to 2 hours   before Bedtime as Needed for Sleep 180 capsule 3   gemfibrozil (LOPID) 600 MG tablet TAKE 1 TABLET BY MOUTH TWICE  DAILY WITH MEALS FOR CHOLESTEROL 200 tablet 2   oxybutynin (DITROPAN-XL) 10 MG 24 hr tablet Take  1 tablet  Daily  for OverActive Bladder 90 tablet 3   traZODone (DESYREL) 150 MG tablet TAKE 1 TABLET BY MOUTH 1 HOUR  BEFORE BEDTIME AS NEEDED FOR  SLEEP 90 tablet 3   Cholecalciferol (VITAMIN D PO) Take 5,000 Int'l Units by mouth daily. (Patient not taking: Reported on 07/22/2022)     Cyanocobalamin (VITAMIN B-12) 1000 MCG SUBL Dissolve under tongue Daily (Patient not taking: Reported on 07/22/2022)     triamcinolone (NASACORT) 55 MCG/ACT AERO nasal inhaler Place 2 sprays into the nose at bedtime. 3 each 1   No current facility-administered medications on file  prior to visit.    Current Problems (verified) Patient Active Problem List   Diagnosis Date Noted   Insomnia 06/27/2020   Statin myopathy 11/21/2019   Constipation 12/15/2017   OSA on CPAP 04/19/2015   Vitamin D deficiency 09/14/2013   Medication management 09/14/2013   History of colonic polyps 08/02/2013   Gastroesophageal reflux disease without esophagitis 08/02/2013   Hyperlipidemia, mixed    Essential hypertension    Idiopathic gout    Abnormal glucose     Screening Tests Immunization History  Administered Date(s) Administered   Influenza, High Dose Seasonal PF 12/18/2013, 10/28/2015, 12/10/2016, 01/26/2018, 11/10/2018, 12/24/2020, 01/14/2022   Influenza,inj,Quad PF,6+ Mos 03/07/2013   Moderna Sars-Covid-2 Vaccination 03/14/2019, 04/11/2019, 02/04/2020   PNEUMOCOCCAL CONJUGATE-20 07/01/2021   Pneumococcal Conjugate-13 03/28/2014   Pneumococcal-Unspecified 03/03/2007   Td 03/02/2004   Tdap 10/25/2014   Zoster, Live  08/26/2016   Health Maintenance  Topic Date Due   COVID-19 Vaccine (4 - 2023-24 season) 08/07/2022 (Originally 10/31/2021)   Zoster Vaccines- Shingrix (1 of 2) 10/22/2022 (Originally 03/19/1953)   INFLUENZA VACCINE  10/01/2022   Medicare Annual Wellness (AWV)  07/22/2023   DTaP/Tdap/Td (3 - Td or Tdap) 10/24/2024   Pneumonia Vaccine 77+ Years old  Completed   HPV VACCINES  Aged Out     Names of Other Physician/Practitioners you currently use: 1. [facility] here for primary care 2. Last eye: upcoming appointment 08/02/22 3. Last dental, 04/2022  Patient Care Team: [name], MD as PCP - General (Internal Medicine) Teresa Coombs, MD as Consulting Physician (Ophthalmology) [place], Illene Labrador, MD (Inactive) as Consulting Physician (Orthopedic Surgery) Louis Meckel, MD (Inactive) as Consulting Physician (Gastroenterology) Carilyn Goodpasture as Physician Assistant (Sleep Medicine)   History reviewed:  allergies, current medications, past family history, past medical history, past social history, past surgical history and problem list  Allergies Allergies  Allergen Reactions   Ace Inhibitors Cough   Lipitor [Atorvastatin]     Elevates CPK and Aldolase    SURGICAL HISTORY He  has a past surgical history that includes Carpal tunnel release (Left, 2007); Rotator cuff repair (Left, 2000); Other surgical history (2007); Cardiac Event Monitor (May-June 2017); and Cataract extraction, bilateral. FAMILY HISTORY His family history includes Hypertension in his mother; Seizures in his sister; Stroke in his father. SOCIAL HISTORY He  reports that he has never smoked. He has never used smokeless tobacco. He reports current alcohol use of about 15.0 standard drinks of alcohol per week. He reports that he does not use drugs.  MEDICARE WELLNESS OBJECTIVES: Physical activity: Current Exercise Habits: The patient does not participate in regular exercise at present, Exercise limited by: orthopedic condition(s) Cardiac risk factors: Cardiac Risk Factors include: advanced age (>71men, >9 women);dyslipidemia;hypertension;male gender;sedentary lifestyle Depression/mood screen:      07/22/2022    3:42 PM  Depression screen PHQ 2/9  Decreased Interest 0  Down, Depressed, Hopeless 0  PHQ - 2 Score 0    ADLs:     07/22/2022    3:42 PM 04/20/2022   10:04 PM  In your present state of health, do you have any difficulty performing the following activities:  Hearing? 0 0  Vision? 0 0  Difficulty concentrating or making decisions? 0 0  Walking or climbing stairs? 1 0  Dressing or bathing? 0 0  Doing errands, shopping? 0 0     Cognitive Testing  Alert? Yes  Normal Appearance?Yes  Oriented to person? Yes  Place? Yes   Time? Yes  Recall of three objects?  Yes  Can perform simple calculations? Yes  Displays appropriate judgment?Yes  Can read the correct time from a watch face?Yes  EOL planning: Does  Patient Have a Medical Advance Directive?: Yes Type of Advance Directive: Healthcare Power of Attorney, Living will Does patient want to make changes to medical advance directive?: No - Patient declined Copy of Healthcare Power of Attorney in Chart?: No - copy requested   Objective:   Blood pressure 126/72, pulse 71, temperature 97.7 F (36.5 C), height 5' 11.5" (1.816 m), weight 172 lb 3.2 oz (78.1 kg), SpO2 94 %. Body mass index is 23.68 kg/m.  General appearance: alert, no distress, WD/WN, male HEENT: normocephalic, sclerae anicteric, TMs pearly, nares patent, no discharge or erythema, pharynx normal Oral cavity: MMM, no lesions Neck: supple, no lymphadenopathy, no thyromegaly, no masses Heart: RRR,  normal S1, S2, 2/6 rumbling early systolic murmur without radiation Lungs: CTA bilaterally, no wheezes, rhonchi, or rales Abdomen: +bs, soft, mild epigastric tenderness, non distended, no masses, no hepatomegaly, no splenomegaly Musculoskeletal: nontender, no swelling, no obvious deformity Extremities: no edema, no cyanosis, no clubbing Pulses: 2+ symmetric, upper and lower extremities, normal cap refill Neurological: alert, oriented x 3, CN2-12 intact, strength normal upper extremities and lower extremities, sensation normal throughout except left leg decrease sensation of foot, DTRs 2+ throughout, no cerebellar signs, gait antalgic with cane Psychiatric: normal affect, behavior normal, pleasant   Medicare Attestation I have personally reviewed: The patient's medical and social history Their use of alcohol, tobacco or illicit drugs Their current medications and supplements The patient's functional ability including ADLs,fall risks, home safety risks, cognitive, and hearing and visual impairment Diet and physical activities Evidence for depression or mood disorders  The patient's weight, height, BMI, and visual acuity have been recorded in the chart.  I have made referrals,  counseling, and provided education to the patient based on review of the above and I have provided the patient with a written personalized care plan for preventive services.     Raynelle Dick, NP   07/22/2022

## 2022-07-22 ENCOUNTER — Encounter: Payer: Self-pay | Admitting: Nurse Practitioner

## 2022-07-22 ENCOUNTER — Ambulatory Visit (INDEPENDENT_AMBULATORY_CARE_PROVIDER_SITE_OTHER): Payer: Medicare Other | Admitting: Nurse Practitioner

## 2022-07-22 VITALS — BP 126/72 | HR 71 | Temp 97.7°F | Ht 71.5 in | Wt 172.2 lb

## 2022-07-22 DIAGNOSIS — G629 Polyneuropathy, unspecified: Secondary | ICD-10-CM

## 2022-07-22 DIAGNOSIS — G4700 Insomnia, unspecified: Secondary | ICD-10-CM

## 2022-07-22 DIAGNOSIS — M1000 Idiopathic gout, unspecified site: Secondary | ICD-10-CM

## 2022-07-22 DIAGNOSIS — R0981 Nasal congestion: Secondary | ICD-10-CM | POA: Diagnosis not present

## 2022-07-22 DIAGNOSIS — R7309 Other abnormal glucose: Secondary | ICD-10-CM

## 2022-07-22 DIAGNOSIS — E559 Vitamin D deficiency, unspecified: Secondary | ICD-10-CM | POA: Diagnosis not present

## 2022-07-22 DIAGNOSIS — Z0000 Encounter for general adult medical examination without abnormal findings: Secondary | ICD-10-CM

## 2022-07-22 DIAGNOSIS — R8290 Unspecified abnormal findings in urine: Secondary | ICD-10-CM | POA: Diagnosis not present

## 2022-07-22 DIAGNOSIS — R6889 Other general symptoms and signs: Secondary | ICD-10-CM

## 2022-07-22 DIAGNOSIS — Z0001 Encounter for general adult medical examination with abnormal findings: Secondary | ICD-10-CM | POA: Diagnosis not present

## 2022-07-22 DIAGNOSIS — I10 Essential (primary) hypertension: Secondary | ICD-10-CM | POA: Diagnosis not present

## 2022-07-22 DIAGNOSIS — E782 Mixed hyperlipidemia: Secondary | ICD-10-CM

## 2022-07-22 DIAGNOSIS — K219 Gastro-esophageal reflux disease without esophagitis: Secondary | ICD-10-CM | POA: Diagnosis not present

## 2022-07-22 DIAGNOSIS — G4733 Obstructive sleep apnea (adult) (pediatric): Secondary | ICD-10-CM

## 2022-07-22 DIAGNOSIS — Z79899 Other long term (current) drug therapy: Secondary | ICD-10-CM | POA: Diagnosis not present

## 2022-07-22 DIAGNOSIS — Z1159 Encounter for screening for other viral diseases: Secondary | ICD-10-CM

## 2022-07-22 MED ORDER — VALACYCLOVIR HCL 1 G PO TABS: 1000.0000 mg | ORAL_TABLET | Freq: Every day | ORAL | 3 refills | Status: DC

## 2022-07-22 MED ORDER — ALLOPURINOL 300 MG PO TABS: ORAL_TABLET | ORAL | 3 refills | Status: DC

## 2022-07-22 NOTE — Patient Instructions (Signed)

## 2022-07-23 LAB — COMPLETE METABOLIC PANEL WITH GFR
AG Ratio: 1.1 (calc) (ref 1.0–2.5)
ALT: 9 U/L (ref 9–46)
AST: 20 U/L (ref 10–35)
Albumin: 4 g/dL (ref 3.6–5.1)
Alkaline phosphatase (APISO): 78 U/L (ref 35–144)
BUN: 15 mg/dL (ref 7–25)
CO2: 27 mmol/L (ref 20–32)
Calcium: 9.5 mg/dL (ref 8.6–10.3)
Chloride: 100 mmol/L (ref 98–110)
Creat: 1.04 mg/dL (ref 0.70–1.22)
Globulin: 3.6 g/dL (calc) (ref 1.9–3.7)
Glucose, Bld: 110 mg/dL — ABNORMAL HIGH (ref 65–99)
Potassium: 4.2 mmol/L (ref 3.5–5.3)
Sodium: 136 mmol/L (ref 135–146)
Total Bilirubin: 0.4 mg/dL (ref 0.2–1.2)
Total Protein: 7.6 g/dL (ref 6.1–8.1)
eGFR: 70 mL/min/{1.73_m2} (ref >=60)

## 2022-07-23 LAB — CBC WITH DIFFERENTIAL/PLATELET
Absolute Monocytes: 542 cells/uL (ref 200–950)
Basophils Absolute: 42 cells/uL (ref 0–200)
Basophils Relative: 1 %
Eosinophils Absolute: 424 cells/uL (ref 15–500)
Eosinophils Relative: 10.1 %
HCT: 43.7 % (ref 38.5–50.0)
Hemoglobin: 14.5 g/dL (ref 13.2–17.1)
Lymphs Abs: 1764 cells/uL (ref 850–3900)
MCH: 30.6 pg (ref 27.0–33.0)
MCHC: 33.2 g/dL (ref 32.0–36.0)
MCV: 92.2 fL (ref 80.0–100.0)
MPV: 9.8 fL (ref 7.5–12.5)
Monocytes Relative: 12.9 %
Neutro Abs: 1428 cells/uL — ABNORMAL LOW (ref 1500–7800)
Neutrophils Relative %: 34 %
Platelets: 277 10*3/uL (ref 140–400)
RBC: 4.74 10*6/uL (ref 4.20–5.80)
RDW: 12.1 % (ref 11.0–15.0)
Total Lymphocyte: 42 %
WBC: 4.2 10*3/uL (ref 3.8–10.8)

## 2022-07-23 LAB — LIPID PANEL
Cholesterol: 185 mg/dL (ref <200)
HDL: 81 mg/dL (ref >=40)
LDL Cholesterol (Calc): 88 mg/dL (calc)
Non-HDL Cholesterol (Calc): 104 mg/dL (calc) (ref <130)
Total CHOL/HDL Ratio: 2.3 (calc) (ref <5.0)
Triglycerides: 75 mg/dL (ref <150)

## 2022-07-23 LAB — URINALYSIS, ROUTINE W REFLEX MICROSCOPIC
Bilirubin Urine: NEGATIVE
Glucose, UA: NEGATIVE
Hgb urine dipstick: NEGATIVE
Ketones, ur: NEGATIVE
Leukocytes,Ua: NEGATIVE
Nitrite: NEGATIVE
Protein, ur: NEGATIVE
Specific Gravity, Urine: 1.01 (ref 1.001–1.035)
pH: 7 (ref 5.0–8.0)

## 2022-07-23 LAB — URINE CULTURE
MICRO NUMBER:: 14990925
Result:: NO GROWTH
SPECIMEN QUALITY:: ADEQUATE

## 2022-07-23 LAB — C. TRACHOMATIS/N. GONORRHOEAE RNA
C. trachomatis RNA, TMA: NOT DETECTED
N. gonorrhoeae RNA, TMA: NOT DETECTED

## 2022-07-23 LAB — MAGNESIUM: Magnesium: 2.1 mg/dL (ref 1.5–2.5)

## 2022-08-03 DIAGNOSIS — Z961 Presence of intraocular lens: Secondary | ICD-10-CM | POA: Diagnosis not present

## 2022-08-17 DIAGNOSIS — G4733 Obstructive sleep apnea (adult) (pediatric): Secondary | ICD-10-CM | POA: Diagnosis not present

## 2022-08-26 ENCOUNTER — Encounter: Payer: Self-pay | Admitting: Nurse Practitioner

## 2022-08-26 ENCOUNTER — Ambulatory Visit (INDEPENDENT_AMBULATORY_CARE_PROVIDER_SITE_OTHER): Payer: Medicare Other | Admitting: Nurse Practitioner

## 2022-08-26 ENCOUNTER — Other Ambulatory Visit: Payer: Self-pay

## 2022-08-26 VITALS — BP 130/82 | HR 71 | Temp 97.9°F | Ht 71.5 in | Wt 173.8 lb

## 2022-08-26 DIAGNOSIS — J301 Allergic rhinitis due to pollen: Secondary | ICD-10-CM | POA: Diagnosis not present

## 2022-08-26 DIAGNOSIS — R0981 Nasal congestion: Secondary | ICD-10-CM

## 2022-08-26 DIAGNOSIS — R0982 Postnasal drip: Secondary | ICD-10-CM | POA: Diagnosis not present

## 2022-08-26 MED ORDER — CETIRIZINE HCL 10 MG PO TABS
10.0000 mg | ORAL_TABLET | Freq: Every day | ORAL | 0 refills | Status: DC
Start: 2022-08-26 — End: 2022-12-24

## 2022-08-26 NOTE — Progress Notes (Signed)
Assessment and Plan:  Jeremy Boone was seen today for an episodic visit.  1. Seasonal allergic rhinitis due to pollen Zyrtec sample provided - take daily as directed. Avoid triggers  - cetirizine (ZYRTEC ALLERGY) 10 MG tablet; Take 1 tablet (10 mg total) by mouth daily.  Dispense: 30 tablet; Refill: 0  2. PND (post-nasal drip)/nasal congestion Stay well hydrated to keep mucus thin and productive.    Notify office for further evaluation and treatment, questions or concerns if s/s fail to improve. The risks and benefits of my recommendations, as well as other treatment options were discussed with the patient today. Questions were answered.  Further disposition pending results of labs. Discussed med's effects and SE's.    Over 15 minutes of exam, counseling, chart review, and critical decision making was performed.   Future Appointments  Date Time Provider Department Center  11/09/2022  3:30 PM [name], MD [facility] None  02/08/2023  3:30 PM Raynelle Dick, NP [facility] None  05/26/2023 10:00 AM [name], MD [facility] None    ------------------------------------------------------------------------------------------------------------------   HPI BP 130/82   Pulse 71   Temp 97.9 F (36.6 C)   Ht 5' 11.5" (1.816 m)   Wt 173 lb 12.8 oz (78.8 kg)   SpO2 92%   BMI 23.90 kg/m   Jeremy Boone is here for evaluation of possible allergic rhinitis. Patient's symptoms include clear rhinorrhea, postnasal drip, and sinus pressure. These symptoms are seasonal. Current triggers include exposure to no known precipitant. The patient has been suffering from these symptoms for approximately 1 month. The patient has tried  none  . Immunotherapy has never been tried.     The patient has no history of asthma. The patient has no history of eczema. The patient does not suffer from frequent sinopulmonary infections. The patient has not had sinus surgery in the past.   Past  Medical History:  Diagnosis Date   Gout    Hyperlipidemia    Hypertension    Prediabetes      Allergies  Allergen Reactions   Ace Inhibitors Cough   Lipitor [Atorvastatin]     Elevates CPK and Aldolase    Current Outpatient Medications on File Prior to Visit  Medication Sig   allopurinol (ZYLOPRIM) 300 MG tablet TAKE 1/2 TABLET BY MOUTH  DAILY FOR GOUT PREVENTION   Cholecalciferol (VITAMIN D PO) Take 5,000 Int'l Units by mouth daily.   Cyanocobalamin (VITAMIN B-12) 1000 MCG SUBL Dissolve under tongue Daily   ezetimibe (ZETIA) 10 MG tablet TAKE 1 TABLET BY MOUTH  DAILY FOR CHOLESTEROL   famotidine (PEPCID) 20 MG tablet Take 1 tablet  2 x / day before Breakfast & Supper to Prevent Heartburn & Indigestion   gabapentin (NEURONTIN) 300 MG capsule Take   1 to 2 capsules   1 to 2 hours   before Bedtime as Needed for Sleep   gemfibrozil (LOPID) 600 MG tablet TAKE 1 TABLET BY MOUTH TWICE  DAILY WITH MEALS FOR CHOLESTEROL   oxybutynin (DITROPAN-XL) 10 MG 24 hr tablet Take  1 tablet  Daily  for OverActive Bladder   traZODone (DESYREL) 150 MG tablet TAKE 1 TABLET BY MOUTH 1 HOUR  BEFORE BEDTIME AS NEEDED FOR  SLEEP   triamcinolone (NASACORT) 55 MCG/ACT AERO nasal inhaler Place 2 sprays into the nose at bedtime.   valACYclovir (VALTREX) 1000 MG tablet Take 1 tablet (1,000 mg total) by mouth daily.   No current facility-administered medications on file prior to visit.  ROS: all negative except what is noted in the HPI.   Physical Exam:  BP 130/82   Pulse 71   Temp 97.9 F (36.6 C)   Ht 5' 11.5" (1.816 m)   Wt 173 lb 12.8 oz (78.8 kg)   SpO2 92%   BMI 23.90 kg/m   General Appearance: NAD.  Awake, conversant and cooperative. Eyes: PERRLA, EOMs intact.  Sclera white.  Conjunctiva without erythema. Sinuses: No frontal/maxillary tenderness.  No nasal discharge. Nares patent.  ENT/Mouth: Ext aud canals clear.  Bilateral TMs w/DOL and without erythema or bulging. Hearing intact.   Posterior pharynx without swelling or exudate.  Tonsils without swelling or erythema.  Neck: Supple.  No masses, nodules or thyromegaly. Respiratory: Effort is regular with non-labored breathing. Breath sounds are equal bilaterally without rales, rhonchi, wheezing or stridor.  Cardio: RRR with no MRGs. Brisk peripheral pulses without edema.  Abdomen: Active BS in all four quadrants.  Soft and non-tender without guarding, rebound tenderness, hernias or masses. Lymphatics: Non tender without lymphadenopathy.  Musculoskeletal: Full ROM, 5/5 strength, normal ambulation.  No clubbing or cyanosis. Skin: Appropriate color for ethnicity. Warm without rashes, lesions, ecchymosis, ulcers.  Neuro: CN II-XII grossly normal. Normal muscle tone without cerebellar symptoms and intact sensation.   Psych: AO X 3,  appropriate mood and affect, insight and judgment.     Adela Glimpse, NP 9:34 AM [facility]

## 2022-09-15 ENCOUNTER — Other Ambulatory Visit: Payer: Self-pay

## 2022-09-15 DIAGNOSIS — Z1211 Encounter for screening for malignant neoplasm of colon: Secondary | ICD-10-CM | POA: Diagnosis not present

## 2022-09-15 DIAGNOSIS — Z1212 Encounter for screening for malignant neoplasm of rectum: Secondary | ICD-10-CM | POA: Diagnosis not present

## 2022-09-15 LAB — POC HEMOCCULT BLD/STL (HOME/3-CARD/SCREEN)
Card #2 Fecal Occult Blod, POC: NEGATIVE
Card #3 Fecal Occult Blood, POC: NEGATIVE
Fecal Occult Blood, POC: NEGATIVE

## 2022-10-24 ENCOUNTER — Other Ambulatory Visit: Payer: Self-pay | Admitting: Internal Medicine

## 2022-10-24 DIAGNOSIS — M1000 Idiopathic gout, unspecified site: Secondary | ICD-10-CM

## 2022-10-26 ENCOUNTER — Ambulatory Visit: Payer: Medicare Other | Admitting: Internal Medicine

## 2022-11-09 ENCOUNTER — Encounter: Payer: Self-pay | Admitting: Internal Medicine

## 2022-11-09 ENCOUNTER — Ambulatory Visit (INDEPENDENT_AMBULATORY_CARE_PROVIDER_SITE_OTHER): Payer: Medicare Other | Admitting: Internal Medicine

## 2022-11-09 VITALS — BP 122/82 | HR 60 | Temp 97.7°F | Resp 16 | Ht 71.5 in | Wt 178.4 lb

## 2022-11-09 DIAGNOSIS — E559 Vitamin D deficiency, unspecified: Secondary | ICD-10-CM

## 2022-11-09 DIAGNOSIS — I872 Venous insufficiency (chronic) (peripheral): Secondary | ICD-10-CM | POA: Diagnosis not present

## 2022-11-09 DIAGNOSIS — I10 Essential (primary) hypertension: Secondary | ICD-10-CM | POA: Diagnosis not present

## 2022-11-09 DIAGNOSIS — R7309 Other abnormal glucose: Secondary | ICD-10-CM

## 2022-11-09 DIAGNOSIS — E782 Mixed hyperlipidemia: Secondary | ICD-10-CM | POA: Diagnosis not present

## 2022-11-09 DIAGNOSIS — M1000 Idiopathic gout, unspecified site: Secondary | ICD-10-CM

## 2022-11-09 DIAGNOSIS — Z79899 Other long term (current) drug therapy: Secondary | ICD-10-CM

## 2022-11-09 DIAGNOSIS — E538 Deficiency of other specified B group vitamins: Secondary | ICD-10-CM | POA: Diagnosis not present

## 2022-11-09 MED ORDER — FUROSEMIDE 40 MG PO TABS: ORAL_TABLET | ORAL | 3 refills | Status: DC

## 2022-11-09 NOTE — Progress Notes (Signed)
Future Appointments  Date Time Provider Department  11/09/2022                            6 mo ov  3:30 PM [name], MD [facility]  [facility]  [facility]    History of Present Illness:      This very nice 88 y.o. MBM presents for 6  month follow up with HTN, HLD, Pre-Diabetes, Vitamin B12 Deficiency and Vitamin D Deficiency.  Patient has hx/o Gout controlled on his Allopurinol. He also is on CPAP for OSA with improved Sleep Hygiene. Patient also has oBPH / LUTS improved with Tamsulosin.        Patient is treated for HTN (1998) & BP has been controlled at home. Today's BP  is at goal - 122/82 . Patient has had no complaints of any cardiac type chest pain, palpitations, dyspnea Pollyann Kennedy /PND, dizziness, claudication, but does report dependent edema.        Hyperlipidemia is controlled with diet & Gemfibrozil & Ezetimibe. Patient denies myalgias or other med SE's. Last Lipids were at goal :  Lab Results  Component Value Date   CHOL 185 07/22/2022   HDL 81 07/22/2022   LDLCALC 88 07/22/2022   TRIG 75 07/22/2022   CHOLHDL 2.3 07/22/2022     Also, the patient has history of PreDiabetes  (A1c 6.3% /2011) and has had no symptoms of reactive hypoglycemia, diabetic polys, paresthesias or visual blurring.  Last A1c was not at goal:  Lab Results  Component Value Date   HGBA1C 6.1 (H) 04/21/2022         Patient has been on Vitamin B12 SL for deficiency.                                                     Further, the patient also has history of Vitamin D Deficiency  ("25" /2008) and supplements vitamin D without any suspected side-effects. Last vitamin D was at goal :  Lab Results  Component Value Date   VD25OH 63 04/21/2022     Current Outpatient Medications  Medication Instructions   allopurinol 300 MG tablet Take  1 tablet  Daily   cetirizine  10 mg  Daily   VITAMIN D 5,000 Units  Daily   VITAMIN B-12 1000 mcg SL Dissolve under tongue Daily    ezetimibe 10 MG tablet TAKE 1 TABLET  DAILY    famotidine  20 MG tablet Take 1 tablet  2 x / day    gabapentin 300 MG capsule Take   1-2 capsules 1-2 hours   before Bedtime    gemfibrozil  600 MG tablet TAKE 1 TABLET TWICE  DAILY    oxybutynin-XL  10 MG  Take  1 tablet  Daily    traZODone 150 MG tablet TAKE 1 TABLET  BEDTIME    NASACORT nasal inhaler 2 sprays at bedtime   valACYclovir  1,000 mg, Oral, Daily     Allergies  Allergen Reactions   Ace Inhibitors Cough   Lipitor [Atorvastatin]     Elevates CPK and Aldolase     PMHx:   Past Medical History:  Diagnosis Date   Gout    Hyperlipidemia    Hypertension    Prediabetes      Immunization  History  Administered Date(s) Administered   Influenza, High Dose  12/10/2016, 01/26/2018, 11/10/2018   Influenza,inj,Quad  03/07/2013   Moderna Sars-Covid-2 Vacc 03/14/2019, 04/11/2019, 02/04/2020   Pneumococcal -13 03/28/2014   Pneumococcal-23 03/03/2007   Td 03/02/2004   Tdap 10/25/2014   Zoster, Live 08/26/2016     Past Surgical History:  Procedure Laterality Date   Cardiac Event Monitor  May-June 2017   Sinus rhythm with average heart rate 74.2 - no notable arrhythmias. Minimal PVCs. No PACs.   CARPAL TUNNEL RELEASE Left 2007   CATARACT EXTRACTION, BILATERAL     OTHER SURGICAL HISTORY  2007   Negative biopsy tonsil mass   ROTATOR CUFF REPAIR Left 2000    FHx:    Reviewed / unchanged   SHx:    Reviewed / unchanged    Systems Review:  Constitutional: Denies fever, chills, wt changes, headaches, insomnia, fatigue, night sweats, change in appetite. Eyes: Denies redness, blurred vision, diplopia, discharge, itchy, watery eyes.  ENT: Denies discharge, congestion, post nasal drip, epistaxis, sore throat, earache, hearing loss, dental pain, tinnitus, vertigo, sinus pain, snoring.  CV: Denies chest pain, palpitations, irregular heartbeat, syncope, dyspnea, diaphoresis, orthopnea, PND, claudication or edema. Respiratory:  denies cough, dyspnea, DOE, pleurisy, hoarseness, laryngitis, wheezing.  Gastrointestinal: Denies dysphagia, odynophagia, heartburn, reflux, water brash, abdominal pain or cramps, nausea, vomiting, bloating, diarrhea, constipation, hematemesis, melena, hematochezia  or hemorrhoids. Genitourinary: Denies dysuria, frequency, urgency, nocturia, hesitancy, discharge, hematuria or flank pain. Musculoskeletal: Denies arthralgias, myalgias, stiffness, jt. swelling, pain, limping or strain/sprain.  Skin: Denies pruritus, rash, hives, warts, acne, eczema or change in skin lesion(s). Neuro: No weakness, tremor, incoordination, spasms, paresthesia or pain. Psychiatric: Denies confusion, memory loss or sensory loss. Endo: Denies change in weight, skin or hair change.  Heme/Lymph: No excessive bleeding, bruising or enlarged lymph nodes.   Physical Exam  BP 122/82   Pulse 60   Temp 97.7 F (36.5 C)   Resp 16   Ht 5' 11.5" (1.816 m)   Wt 178 lb 6.4 oz (80.9 kg)   SpO2 94%   BMI 24.53 kg/m   Appears  well nourished, well groomed  and in no distress.  Eyes: PERRLA, EOMs, conjunctiva no swelling or erythema. Sinuses: No frontal/maxillary tenderness ENT/Mouth: EAC's clear, TM's nl w/o erythema, bulging. Nares clear w/o erythema, swelling, exudates. Oropharynx clear without erythema or exudates. Oral hygiene is good. Tongue normal, non obstructing. Hearing intact.  Neck: Supple. Thyroid not palpable. Car 2+/2+ without bruits, nodes or JVD. Chest: Respirations nl with BS clear & equal w/o rales, rhonchi, wheezing or stridor.  Cor: Heart sounds normal w/ regular rate and rhythm without sig. murmurs, gallops, clicks or rubs. Peripheral pulses normal and equal  without edema.  Abdomen: Soft & bowel sounds normal. Non-tender w/o guarding, rebound, hernias, masses or organomegaly.  Lymphatics: Unremarkable.  Musculoskeletal: Full ROM all peripheral extremities, joint stability, 5/5 strength and normal gait.   Skin: Warm, dry without exposed rashes, lesions or ecchymosis apparent.  Neuro: Cranial nerves intact, reflexes equal bilaterally. Sensory-motor testing grossly intact. Tendon reflexes grossly intact.  Pysch: Alert & oriented x 3.  Insight and judgement nl & appropriate. No ideations.  Assessment and Plan:   1. Essential hypertension  - Continue medication, monitor blood pressure at home.  - Continue DASH diet.  Reminder to go to the ER if any CP,  SOB, nausea, dizziness, severe HA, changes vision/speech.    - CBC with Differential/Platelet - COMPLETE METABOLIC PANEL WITH GFR - Magnesium -  TSH   2. Hyperlipidemia, mixed  - Continue diet/meds, exercise,& lifestyle modifications.  - Continue monitor periodic cholesterol/liver & renal functions     - Lipid panel - TSH   3. Abnormal glucose  - Continue diet, exercise  - Lifestyle modifications.  - Monitor appropriate labs   - Hemoglobin A1c - Insulin, random   4. Vitamin D deficiency  - Continue supplementation  - VITAM IN D 25 Hydroxy    5. Vitamin B12 deficiency  - Vitamin B12   6. Idiopathic gout  - Uric acid   7. Medication management  - CBC with Differential/Platelet - COMPLETE METABOLIC PANEL WITH GFR - Magnesium - Lipid panel - TSH - Hemoglobin A1c - Insulin, random - Vitamin B12 - Uric acid - VITAMIN D 25 Hydroxy          Discussed  regular exercise, BP monitoring, weight control to achieve/maintain BMI less than 25 and discussed med and SE's. Recommended labs to assess and monitor clinical status with further disposition pending results of labs.  I discussed the assessment and treatment plan with the patient. The patient was provided an opportunity to ask questions and all were answered. The patient agreed with the plan and demonstrated an understanding of the instructions.  I provided over 30 minutes of exam, counseling, chart review and  complex critical decision making.        The  patient was advised to call back or seek an in-person evaluation if the symptoms worsen or if the condition fails to improve as anticipated.   Marinus Maw, MD

## 2022-11-09 NOTE — Patient Instructions (Signed)

## 2022-11-10 LAB — CBC WITH DIFFERENTIAL/PLATELET
Absolute Monocytes: 519 {cells}/uL (ref 200–950)
Basophils Absolute: 40 {cells}/uL (ref 0–200)
Basophils Relative: 0.7 %
Eosinophils Absolute: 308 {cells}/uL (ref 15–500)
Eosinophils Relative: 5.4 %
HCT: 44.8 % (ref 38.5–50.0)
Hemoglobin: 14.9 g/dL (ref 13.2–17.1)
Lymphs Abs: 2924 {cells}/uL (ref 850–3900)
MCH: 30.3 pg (ref 27.0–33.0)
MCHC: 33.3 g/dL (ref 32.0–36.0)
MCV: 91.2 fL (ref 80.0–100.0)
MPV: 9.8 fL (ref 7.5–12.5)
Monocytes Relative: 9.1 %
Neutro Abs: 1910 {cells}/uL (ref 1500–7800)
Neutrophils Relative %: 33.5 %
Platelets: 242 10*3/uL (ref 140–400)
RBC: 4.91 10*6/uL (ref 4.20–5.80)
RDW: 13.1 % (ref 11.0–15.0)
Total Lymphocyte: 51.3 %
WBC: 5.7 10*3/uL (ref 3.8–10.8)

## 2022-11-10 LAB — LIPID PANEL
Cholesterol: 218 mg/dL — ABNORMAL HIGH (ref <200)
HDL: 86 mg/dL (ref >=40)
LDL Cholesterol (Calc): 113 mg/dL — ABNORMAL HIGH
Non-HDL Cholesterol (Calc): 132 mg/dL — ABNORMAL HIGH (ref <130)
Total CHOL/HDL Ratio: 2.5 (calc) (ref <5.0)
Triglycerides: 89 mg/dL (ref <150)

## 2022-11-10 LAB — COMPLETE METABOLIC PANEL WITH GFR
AG Ratio: 1.3 (calc) (ref 1.0–2.5)
ALT: 13 U/L (ref 9–46)
AST: 22 U/L (ref 10–35)
Albumin: 4.6 g/dL (ref 3.6–5.1)
Alkaline phosphatase (APISO): 73 U/L (ref 35–144)
BUN: 17 mg/dL (ref 7–25)
CO2: 29 mmol/L (ref 20–32)
Calcium: 9.7 mg/dL (ref 8.6–10.3)
Chloride: 98 mmol/L (ref 98–110)
Creat: 1.05 mg/dL (ref 0.70–1.22)
Globulin: 3.5 g/dL (ref 1.9–3.7)
Glucose, Bld: 92 mg/dL (ref 65–99)
Potassium: 3.8 mmol/L (ref 3.5–5.3)
Sodium: 137 mmol/L (ref 135–146)
Total Bilirubin: 0.5 mg/dL (ref 0.2–1.2)
Total Protein: 8.1 g/dL (ref 6.1–8.1)
eGFR: 69 mL/min/{1.73_m2} (ref >=60)

## 2022-11-10 LAB — VITAMIN B12: Vitamin B-12: 1060 pg/mL (ref 200–1100)

## 2022-11-10 LAB — TSH: TSH: 2.72 m[IU]/L (ref 0.40–4.50)

## 2022-11-10 LAB — INSULIN, RANDOM: Insulin: 14.3 u[IU]/mL

## 2022-11-10 LAB — HEMOGLOBIN A1C
Hgb A1c MFr Bld: 6.4 %{Hb} — ABNORMAL HIGH (ref <5.7)
Mean Plasma Glucose: 137 mg/dL
eAG (mmol/L): 7.6 mmol/L

## 2022-11-10 LAB — URIC ACID: Uric Acid, Serum: 4.9 mg/dL (ref 4.0–8.0)

## 2022-11-10 LAB — VITAMIN D 25 HYDROXY (VIT D DEFICIENCY, FRACTURES): Vit D, 25-Hydroxy: 51 ng/mL (ref 30–100)

## 2022-11-10 LAB — MAGNESIUM: Magnesium: 2.2 mg/dL (ref 1.5–2.5)

## 2022-11-10 NOTE — Progress Notes (Signed)
<>*<>*<>*<>*<>*<>*<>*<>*<>*<>*<>*<>*<>*<>*<>*<>*<>*<>*<>*<>*<>*<>*<>*<>*<> <>*<>*<>*<>*<>*<>*<>*<>*<>*<>*<>*<>*<>*<>*<>*<>*<>*<>*<>*<>*<>*<>*<>*<>*<>  -Test results slightly outside the reference range are not unusual. If there is anything important, I will review this with you,  otherwise it is considered normal test values.  If you have further questions,  please do not hesitate to contact me at the office or via My Chart.   <>*<>*<>*<>*<>*<>*<>*<>*<>*<>*<>*<>*<>*<>*<>*<>*<>*<>*<>*<>*<>*<>*<>*<>*<> <>*<>*<>*<>*<>*<>*<>*<>*<>*<>*<>*<>*<>*<>*<>*<>*<>*<>*<>*<>*<>*<>*<>*<>*<>  -  Total  Chol =  218    -  Elevated             (  Ideal  or  Goal is less than 180  !  )  & -  Bad / Dangerous LDL  Chol = 113 - also Elevated              (  Ideal  or  Goal is less than 70  !  )     -  Both too high  !   - Cholesterol is too high - Recommend a STRICTER  low cholesterol diet  !  - Cholesterol only comes from animal sources                                                                              - ie. meat, dairy, egg yolks  - Eat all the vegetables you want.  - Avoid Meat, Avoid Meat,  Avoid Meat                                                             - especially Red Meat - Beef AND Pork .  - Avoid cheese & dairy - milk & ice cream.     - Cheese is the most concentrated form of trans-fats which                                                              is the worst thing to clog up our arteries.    - Veggie cheese is OK which can be found in the fresh                            produce section at [employer] or [employer] or [employer]  <>*<>*<>*<>*<>*<>*<>*<>*<>*<>*<>*<>*<>*<>*<>*<>*<>*<>*<>*<>*<>*<>*<>*<>*<> <>*<>*<>*<>*<>*<>*<>*<>*<>*<>*<>*<>*<>*<>*<>*<>*<>*<>*<>*<>*<>*<>*<>*<>*<>  -  A1c = 6.4% - right on the borderline of                                                             becoming a full Diabetic when it reaches 6.5%   !   <>*<>*<>*<>*<>*<>*<>*<>*<>*<>*<>*<>*<>*<>*<>*<>*<>*<>*<>*<>*<>*<>*<>*<>*<> <>*<>*<>*<>*<>*<>*<>*<>*<>*<>*<>*<>*<>*<>*<>*<>*<>*<>*<>*<>*<>*<>*<>*<>*<>  Being diabetic has a  300% increased risk for heart attack,  stroke, cancer, and alzheimer- type vascular dementia.   It is very important that you work harder with diet by                                     avoiding all foods that are white except chicken,  fish & calliflower.  - Avoid white rice  (brown & wild rice is OK),   - Avoid white potatoes  (sweet potatoes in moderation is OK),   White bread or wheat bread or anything made out of                                                      white flour like bagels, donuts, rolls, buns, biscuits, cakes,  - pastries, cookies, pizza crust, and pasta (made from white flour & egg whites)   - vegetarian pasta or spinach or wheat pasta is OK.  - Multigrain breads like Arnold's, Pepperidge Farm or                                                                   multigrain sandwich thins or high fiber breads like   Eureka bread or "Dave's Killer" breads that are 4 to 5 grams fiber per slice !  are best.    Diet, exercise and weight loss can reverse and cure diabetes in the early stages.    - Diet, exercise and weight loss is very important in the                                                     control and prevention of complications of diabetes which  affects every system in your body, ie.   -Brain - dementia/stroke,  - eyes - glaucoma/blindness,  - heart - heart attack/heart failure,  - kidneys - dialysis,  - stomach - gastric paralysis,  - intestines - malabsorption,  - nerves - severe painful neuritis,  - circulation - gangrene & loss of a leg(s)  - and finally  . . . . . . . . . . . . . . . . . .    - cancer and  Alzheimers.  <>*<>*<>*<>*<>*<>*<>*<>*<>*<>*<>*<>*<>*<>*<>*<>*<>*<>*<>*<>*<>*<>*<>*<>*<> <>*<>*<>*<>*<>*<>*<>*<>*<>*<>*<>*<>*<>*<>*<>*<>*<>*<>*<>*<>*<>*<>*<>*<>*<>  -  Vitamin B12 level is Normal & at a great level  <>*<>*<>*<>*<>*<>*<>*<>*<>*<>*<>*<>*<>*<>*<>*<>*<>*<>*<>*<>*<>*<>*<>*<>*<> <>*<>*<>*<>*<>*<>*<>*<>*<>*<>*<>*<>*<>*<>*<>*<>*<>*<>*<>*<>*<>*<>*<>*<>*<>  -  Uric Acid / Gout test is normal   <>*<>*<>*<>*<>*<>*<>*<>*<>*<>*<>*<>*<>*<>*<>*<>*<>*<>*<>*<>*<>*<>*<>*<>*<> <>*<>*<>*<>*<>*<>*<>*<>*<>*<>*<>*<>*<>*<>*<>*<>*<>*<>*<>*<>*<>*<>*<>*<>*<>  -  Vitamin D is "OK"  <>*<>*<>*<>*<>*<>*<>*<>*<>*<>*<>*<>*<>*<>*<>*<>*<>*<>*<>*<>*<>*<>*<>*<>*<> <>*<>*<>*<>*<>*<>*<>*<>*<>*<>*<>*<>*<>*<>*<>*<>*<>*<>*<>*<>*<>*<>*<>*<>*<>  -  All Else - CBC - Kidneys - Electrolytes - Liver - Magnesium & Thyroid    - all  Normal / OK  <>*<>*<>*<>*<>*<>*<>*<>*<>*<>*<>*<>*<>*<>*<>*<>*<>*<>*<>*<>*<>*<>*<>*<>*<> <>*<>*<>*<>*<>*<>*<>*<>*<>*<>*<>*<>*<>*<>*<>*<>*<>*<>*<>*<>*<>*<>*<>*<>*<>  -

## 2022-12-01 DIAGNOSIS — J9383 Other pneumothorax: Secondary | ICD-10-CM

## 2022-12-01 HISTORY — DX: Other pneumothorax: J93.83

## 2022-12-07 ENCOUNTER — Other Ambulatory Visit: Payer: Self-pay | Admitting: Internal Medicine

## 2022-12-07 DIAGNOSIS — G629 Polyneuropathy, unspecified: Secondary | ICD-10-CM

## 2022-12-13 ENCOUNTER — Other Ambulatory Visit: Payer: Self-pay | Admitting: Internal Medicine

## 2022-12-13 DIAGNOSIS — N3281 Overactive bladder: Secondary | ICD-10-CM

## 2022-12-24 ENCOUNTER — Encounter (HOSPITAL_COMMUNITY): Payer: Self-pay

## 2022-12-24 ENCOUNTER — Emergency Department (HOSPITAL_COMMUNITY): Payer: Medicare Other

## 2022-12-24 ENCOUNTER — Inpatient Hospital Stay (HOSPITAL_COMMUNITY)
Admission: EM | Admit: 2022-12-24 | Discharge: 2022-12-26 | DRG: 200 | Disposition: A | Discharge status: Home / Self Care | Payer: Medicare Other | Attending: Internal Medicine | Admitting: Internal Medicine

## 2022-12-24 ENCOUNTER — Other Ambulatory Visit: Payer: Self-pay

## 2022-12-24 DIAGNOSIS — Z1152 Encounter for screening for COVID-19: Secondary | ICD-10-CM | POA: Diagnosis not present

## 2022-12-24 DIAGNOSIS — Z888 Allergy status to other drugs, medicaments and biological substances status: Secondary | ICD-10-CM | POA: Diagnosis not present

## 2022-12-24 DIAGNOSIS — Z823 Family history of stroke: Secondary | ICD-10-CM

## 2022-12-24 DIAGNOSIS — R109 Unspecified abdominal pain: Secondary | ICD-10-CM | POA: Diagnosis not present

## 2022-12-24 DIAGNOSIS — Z8249 Family history of ischemic heart disease and other diseases of the circulatory system: Secondary | ICD-10-CM

## 2022-12-24 DIAGNOSIS — J9811 Atelectasis: Secondary | ICD-10-CM | POA: Diagnosis not present

## 2022-12-24 DIAGNOSIS — Z743 Need for continuous supervision: Secondary | ICD-10-CM | POA: Diagnosis not present

## 2022-12-24 DIAGNOSIS — F1090 Alcohol use, unspecified, uncomplicated: Secondary | ICD-10-CM | POA: Diagnosis present

## 2022-12-24 DIAGNOSIS — R7989 Other specified abnormal findings of blood chemistry: Secondary | ICD-10-CM | POA: Diagnosis not present

## 2022-12-24 DIAGNOSIS — I10 Essential (primary) hypertension: Secondary | ICD-10-CM | POA: Diagnosis present

## 2022-12-24 DIAGNOSIS — I2489 Other forms of acute ischemic heart disease: Secondary | ICD-10-CM | POA: Diagnosis not present

## 2022-12-24 DIAGNOSIS — R0989 Other specified symptoms and signs involving the circulatory and respiratory systems: Secondary | ICD-10-CM | POA: Diagnosis not present

## 2022-12-24 DIAGNOSIS — R0603 Acute respiratory distress: Secondary | ICD-10-CM | POA: Diagnosis not present

## 2022-12-24 DIAGNOSIS — G4733 Obstructive sleep apnea (adult) (pediatric): Secondary | ICD-10-CM

## 2022-12-24 DIAGNOSIS — R069 Unspecified abnormalities of breathing: Secondary | ICD-10-CM | POA: Diagnosis not present

## 2022-12-24 DIAGNOSIS — M109 Gout, unspecified: Secondary | ICD-10-CM | POA: Diagnosis present

## 2022-12-24 DIAGNOSIS — E782 Mixed hyperlipidemia: Secondary | ICD-10-CM | POA: Diagnosis present

## 2022-12-24 DIAGNOSIS — I700 Atherosclerosis of aorta: Secondary | ICD-10-CM | POA: Diagnosis not present

## 2022-12-24 DIAGNOSIS — I350 Nonrheumatic aortic (valve) stenosis: Secondary | ICD-10-CM | POA: Diagnosis not present

## 2022-12-24 DIAGNOSIS — J9383 Other pneumothorax: Secondary | ICD-10-CM | POA: Diagnosis not present

## 2022-12-24 DIAGNOSIS — I771 Stricture of artery: Secondary | ICD-10-CM | POA: Diagnosis not present

## 2022-12-24 DIAGNOSIS — Z4682 Encounter for fitting and adjustment of non-vascular catheter: Secondary | ICD-10-CM | POA: Diagnosis not present

## 2022-12-24 DIAGNOSIS — Z79899 Other long term (current) drug therapy: Secondary | ICD-10-CM | POA: Diagnosis not present

## 2022-12-24 DIAGNOSIS — R0602 Shortness of breath: Secondary | ICD-10-CM | POA: Diagnosis not present

## 2022-12-24 DIAGNOSIS — Z789 Other specified health status: Secondary | ICD-10-CM | POA: Diagnosis present

## 2022-12-24 DIAGNOSIS — J939 Pneumothorax, unspecified: Secondary | ICD-10-CM | POA: Diagnosis not present

## 2022-12-24 DIAGNOSIS — R6889 Other general symptoms and signs: Secondary | ICD-10-CM | POA: Diagnosis not present

## 2022-12-24 DIAGNOSIS — K219 Gastro-esophageal reflux disease without esophagitis: Secondary | ICD-10-CM | POA: Diagnosis present

## 2022-12-24 DIAGNOSIS — F17200 Nicotine dependence, unspecified, uncomplicated: Secondary | ICD-10-CM | POA: Diagnosis not present

## 2022-12-24 DIAGNOSIS — K5900 Constipation, unspecified: Secondary | ICD-10-CM | POA: Diagnosis present

## 2022-12-24 DIAGNOSIS — R0902 Hypoxemia: Secondary | ICD-10-CM | POA: Diagnosis not present

## 2022-12-24 DIAGNOSIS — N281 Cyst of kidney, acquired: Secondary | ICD-10-CM | POA: Diagnosis not present

## 2022-12-24 LAB — LIPASE, BLOOD: Lipase: 32 U/L (ref 11–51)

## 2022-12-24 LAB — CBC WITH DIFFERENTIAL/PLATELET
Abs Immature Granulocytes: 0.01 10*3/uL (ref 0.00–0.07)
Basophils Absolute: 0.1 10*3/uL (ref 0.0–0.1)
Basophils Relative: 1 %
Eosinophils Absolute: 0.5 10*3/uL (ref 0.0–0.5)
Eosinophils Relative: 9 %
HCT: 44.9 % (ref 39.0–52.0)
Hemoglobin: 14.6 g/dL (ref 13.0–17.0)
Immature Granulocytes: 0 %
Lymphocytes Relative: 41 %
Lymphs Abs: 2.1 10*3/uL (ref 0.7–4.0)
MCH: 30.1 pg (ref 26.0–34.0)
MCHC: 32.5 g/dL (ref 30.0–36.0)
MCV: 92.6 fL (ref 80.0–100.0)
Monocytes Absolute: 0.4 10*3/uL (ref 0.1–1.0)
Monocytes Relative: 9 %
Neutro Abs: 2.1 10*3/uL (ref 1.7–7.7)
Neutrophils Relative %: 40 %
Platelets: 238 10*3/uL (ref 150–400)
RBC: 4.85 MIL/uL (ref 4.22–5.81)
RDW: 13.9 % (ref 11.5–15.5)
WBC: 5.1 10*3/uL (ref 4.0–10.5)
nRBC: 0 % (ref 0.0–0.2)

## 2022-12-24 LAB — URINALYSIS, W/ REFLEX TO CULTURE (INFECTION SUSPECTED)
Bacteria, UA: NONE SEEN
Bilirubin Urine: NEGATIVE
Glucose, UA: NEGATIVE mg/dL
Ketones, ur: NEGATIVE mg/dL
Leukocytes,Ua: NEGATIVE
Nitrite: NEGATIVE
Protein, ur: 100 mg/dL — AB
Specific Gravity, Urine: 1.011 (ref 1.005–1.030)
pH: 6 (ref 5.0–8.0)

## 2022-12-24 LAB — TROPONIN I (HIGH SENSITIVITY)
Troponin I (High Sensitivity): 120 ng/L (ref <18)
Troponin I (High Sensitivity): 858 ng/L (ref <18)

## 2022-12-24 LAB — COMPREHENSIVE METABOLIC PANEL
ALT: 18 U/L (ref 0–44)
AST: 41 U/L (ref 15–41)
Albumin: 3.7 g/dL (ref 3.5–5.0)
Alkaline Phosphatase: 61 U/L (ref 38–126)
Anion gap: 12 (ref 5–15)
BUN: 15 mg/dL (ref 8–23)
CO2: 19 mmol/L — ABNORMAL LOW (ref 22–32)
Calcium: 9 mg/dL (ref 8.9–10.3)
Chloride: 104 mmol/L (ref 98–111)
Creatinine, Ser: 0.84 mg/dL (ref 0.61–1.24)
GFR, Estimated: >60 mL/min (ref >60)
Glucose, Bld: 116 mg/dL — ABNORMAL HIGH (ref 70–99)
Potassium: 3.5 mmol/L (ref 3.5–5.1)
Sodium: 135 mmol/L (ref 135–145)
Total Bilirubin: 0.7 mg/dL (ref 0.3–1.2)
Total Protein: 7.7 g/dL (ref 6.5–8.1)

## 2022-12-24 LAB — RESP PANEL BY RT-PCR (RSV, FLU A&B, COVID)  RVPGX2
Influenza A by PCR: NEGATIVE
Influenza B by PCR: NEGATIVE
Resp Syncytial Virus by PCR: NEGATIVE
SARS Coronavirus 2 by RT PCR: NEGATIVE

## 2022-12-24 LAB — BRAIN NATRIURETIC PEPTIDE: B Natriuretic Peptide: 21.8 pg/mL (ref 0.0–100.0)

## 2022-12-24 MED ORDER — ENOXAPARIN SODIUM 40 MG/0.4ML IJ SOSY
40.0000 mg | PREFILLED_SYRINGE | INTRAMUSCULAR | Status: DC
  Administered 2022-12-24 – 2022-12-25 (×2): 40 mg via SUBCUTANEOUS
  Filled 2022-12-24 (×2): qty 0.4

## 2022-12-24 MED ORDER — THIAMINE MONONITRATE 100 MG PO TABS
100.0000 mg | ORAL_TABLET | Freq: Every day | ORAL | Status: DC
  Administered 2022-12-25 – 2022-12-26 (×2): 100 mg via ORAL
  Filled 2022-12-24 (×2): qty 1

## 2022-12-24 MED ORDER — ACETAMINOPHEN 650 MG RE SUPP: 650.0000 mg | Freq: Four times a day (QID) | RECTAL | Status: DC | PRN

## 2022-12-24 MED ORDER — HYDRALAZINE HCL 20 MG/ML IJ SOLN: 10.0000 mg | INTRAMUSCULAR | Status: DC | PRN

## 2022-12-24 MED ORDER — OXYCODONE HCL 5 MG PO TABS: 5.0000 mg | ORAL_TABLET | ORAL | Status: DC | PRN

## 2022-12-24 MED ORDER — LORAZEPAM 2 MG/ML IJ SOLN: 1.0000 mg | INTRAMUSCULAR | Status: DC | PRN

## 2022-12-24 MED ORDER — FOLIC ACID 1 MG PO TABS
1.0000 mg | ORAL_TABLET | Freq: Every day | ORAL | Status: DC
  Administered 2022-12-25 – 2022-12-26 (×2): 1 mg via ORAL
  Filled 2022-12-24 (×2): qty 1

## 2022-12-24 MED ORDER — MORPHINE SULFATE (PF) 4 MG/ML IV SOLN
4.0000 mg | INTRAVENOUS | Status: DC | PRN
  Administered 2022-12-24 – 2022-12-25 (×4): 4 mg via INTRAVENOUS
  Filled 2022-12-24 (×4): qty 1

## 2022-12-24 MED ORDER — FAMOTIDINE 20 MG PO TABS
20.0000 mg | ORAL_TABLET | Freq: Every day | ORAL | Status: DC
  Administered 2022-12-24 – 2022-12-26 (×3): 20 mg via ORAL
  Filled 2022-12-24 (×3): qty 1

## 2022-12-24 MED ORDER — SODIUM CHLORIDE 0.9% FLUSH
10.0000 mL | Freq: Three times a day (TID) | INTRAVENOUS | Status: DC
  Administered 2022-12-24 – 2022-12-25 (×3): 10 mL via INTRAPLEURAL

## 2022-12-24 MED ORDER — EZETIMIBE 10 MG PO TABS
10.0000 mg | ORAL_TABLET | Freq: Every day | ORAL | Status: DC
  Administered 2022-12-24 – 2022-12-26 (×3): 10 mg via ORAL
  Filled 2022-12-24 (×3): qty 1

## 2022-12-24 MED ORDER — LIDOCAINE-EPINEPHRINE (PF) 2 %-1:200000 IJ SOLN
20.0000 mL | Freq: Once | INTRAMUSCULAR | Status: AC
  Administered 2022-12-24: 20 mL
  Filled 2022-12-24: qty 20

## 2022-12-24 MED ORDER — ALLOPURINOL 300 MG PO TABS
300.0000 mg | ORAL_TABLET | Freq: Every day | ORAL | Status: DC
  Administered 2022-12-24 – 2022-12-26 (×3): 300 mg via ORAL
  Filled 2022-12-24 (×2): qty 1
  Filled 2022-12-24: qty 3

## 2022-12-24 MED ORDER — IOHEXOL 350 MG/ML SOLN
75.0000 mL | Freq: Once | INTRAVENOUS | Status: AC | PRN
Start: 2022-12-24 — End: 2022-12-24
  Administered 2022-12-24: 75 mL via INTRAVENOUS

## 2022-12-24 MED ORDER — THIAMINE HCL 100 MG/ML IJ SOLN: 100.0000 mg | Freq: Every day | INTRAMUSCULAR | Status: DC

## 2022-12-24 MED ORDER — GEMFIBROZIL 600 MG PO TABS
600.0000 mg | ORAL_TABLET | Freq: Two times a day (BID) | ORAL | Status: DC
  Administered 2022-12-25 – 2022-12-26 (×3): 600 mg via ORAL
  Filled 2022-12-24 (×6): qty 1

## 2022-12-24 MED ORDER — ALBUTEROL SULFATE (2.5 MG/3ML) 0.083% IN NEBU: 2.5000 mg | INHALATION_SOLUTION | RESPIRATORY_TRACT | Status: DC | PRN

## 2022-12-24 MED ORDER — BENZONATATE 100 MG PO CAPS
100.0000 mg | ORAL_CAPSULE | Freq: Three times a day (TID) | ORAL | Status: DC
  Administered 2022-12-24 – 2022-12-26 (×6): 100 mg via ORAL
  Filled 2022-12-24 (×6): qty 1

## 2022-12-24 MED ORDER — GABAPENTIN 300 MG PO CAPS
300.0000 mg | ORAL_CAPSULE | Freq: Every day | ORAL | Status: DC
  Administered 2022-12-25 (×2): 300 mg via ORAL
  Filled 2022-12-24 (×2): qty 1

## 2022-12-24 MED ORDER — ADULT MULTIVITAMIN W/MINERALS CH
1.0000 | ORAL_TABLET | Freq: Every day | ORAL | Status: DC
  Administered 2022-12-25 – 2022-12-26 (×2): 1 via ORAL
  Filled 2022-12-24 (×2): qty 1

## 2022-12-24 MED ORDER — ONDANSETRON HCL 4 MG/2ML IJ SOLN
4.0000 mg | Freq: Four times a day (QID) | INTRAMUSCULAR | Status: DC | PRN
  Administered 2022-12-24: 4 mg via INTRAVENOUS
  Filled 2022-12-24: qty 2

## 2022-12-24 MED ORDER — AMLODIPINE BESYLATE 2.5 MG PO TABS
2.5000 mg | ORAL_TABLET | Freq: Every day | ORAL | Status: DC
  Administered 2022-12-24 – 2022-12-26 (×3): 2.5 mg via ORAL
  Filled 2022-12-24 (×3): qty 1

## 2022-12-24 MED ORDER — ACETAMINOPHEN 325 MG PO TABS: 650.0000 mg | ORAL_TABLET | Freq: Four times a day (QID) | ORAL | Status: DC | PRN

## 2022-12-24 MED ORDER — ONDANSETRON HCL 4 MG PO TABS: 4.0000 mg | ORAL_TABLET | Freq: Four times a day (QID) | ORAL | Status: DC | PRN

## 2022-12-24 MED ORDER — FENTANYL CITRATE PF 50 MCG/ML IJ SOSY
50.0000 ug | PREFILLED_SYRINGE | Freq: Once | INTRAMUSCULAR | Status: AC
  Administered 2022-12-24: 50 ug via INTRAVENOUS
  Filled 2022-12-24: qty 1

## 2022-12-24 MED ORDER — LORAZEPAM 1 MG PO TABS
1.0000 mg | ORAL_TABLET | ORAL | Status: DC | PRN
  Administered 2022-12-25: 1 mg via ORAL
  Filled 2022-12-24: qty 1

## 2022-12-24 MED ORDER — DOCUSATE SODIUM 100 MG PO CAPS
100.0000 mg | ORAL_CAPSULE | Freq: Two times a day (BID) | ORAL | Status: DC
  Administered 2022-12-24 – 2022-12-26 (×3): 100 mg via ORAL
  Filled 2022-12-24 (×4): qty 1

## 2022-12-24 MED ORDER — GUAIFENESIN ER 600 MG PO TB12
600.0000 mg | ORAL_TABLET | Freq: Two times a day (BID) | ORAL | Status: DC
  Administered 2022-12-24 – 2022-12-26 (×5): 600 mg via ORAL
  Filled 2022-12-24 (×5): qty 1

## 2022-12-24 NOTE — H&P (Signed)
[facility] History and Physical   Patient: Jeremy Boone WJX:[id number] DOB: 1934/04/05 DOA: 12/24/2022 PCP: [name], MD  DOS: the patient was seen and examined on 12/24/2022  Patient coming from: Home  Chief Complaint: Abdominal pain.  And shortness of breath ED TRIAGE note: Pt BIB EMS due to resp distress. Fire put pt on cpap. Pt arrives on 4L of O2. C/O stomach distention and bilateral leg swelling. No hx of CHF. Axox4. Pt in no apparent distress at this time. EMS states HTN on 220 systolic.    HPI: Jeremy Boone is a 88 y.o. male with Past medical history of HLD, HTN, gout more.  Active smoker, daily alcohol use. Patient presented to the hospital with complaints of abdominal pain and shortness of breath.  He reports that he was at his baseline yesterday. He woke up this morning and later in the day started having complaints of abdominal pain which is located on the right lower quadrant.  This progressed further to involve right upper quadrant and then lower part of his chest. This was also associated with shortness of breath. Pain was reported as severe and 5 out of 10 in intensity. He called EMS due to pain.  Initially was placed on CPAP with improvement in his shortness of breath.  Was transition to nasal cannula. When arrived in ED he was 86% on room air. Patient also had a very high blood pressure of 220 systolic when EMS found him. Denies any smoking history. Drinks 2 mixed drinks on a daily basis.  Review of Systems: As mentioned in the history of present illness. All other systems reviewed and are negative.  Past Medical History:  Diagnosis Date   Gout    Hyperlipidemia    Hypertension    Prediabetes    Past Surgical History:  Procedure Laterality Date   Cardiac Event Monitor  May-June 2017   Sinus rhythm with average heart rate 74.2 - no notable arrhythmias. Minimal PVCs. No PACs.   CARPAL TUNNEL RELEASE Left 2007   CATARACT EXTRACTION, BILATERAL      OTHER SURGICAL HISTORY  2007   Negative biopsy tonsil mass   ROTATOR CUFF REPAIR Left 2000   Social History:  reports that he has never smoked. He has never used smokeless tobacco. He reports current alcohol use of about 15.0 standard drinks of alcohol per week. He reports that he does not use drugs.  Allergies  Allergen Reactions   Ace Inhibitors Cough   Lipitor [Atorvastatin]     Elevates CPK and Aldolase    Family history reviewed and not pertinent Family History  Problem Relation Age of Onset   Hypertension Mother    Stroke Father    Seizures Sister    Colon cancer Neg Hx    Stomach cancer Neg Hx    Esophageal cancer Neg Hx    Inflammatory bowel disease Neg Hx    Liver disease Neg Hx    Pancreatic cancer Neg Hx    Rectal cancer Neg Hx     Prior to Admission medications   Medication Sig Start Date End Date Taking? Authorizing Provider  allopurinol (ZYLOPRIM) 300 MG tablet Take  1 tablet  Daily for For Gout Prevention                                                                /  TAKE                                         BY                                                 MOUTH 10/24/22  Yes [name], MD  Cholecalciferol (VITAMIN D PO) Take 5,000 Int'l Units by mouth daily.   Yes [provider]  ezetimibe (ZETIA) 10 MG tablet TAKE 1 TABLET BY MOUTH  DAILY FOR CHOLESTEROL 04/20/22  Yes Cranford, Archie Patten, NP  famotidine (PEPCID) 20 MG tablet Take 1 tablet  2 x / day before Breakfast & Supper to Prevent Heartburn & Indigestion 04/27/22  Yes [name], MD  furosemide (LASIX) 40 MG tablet Take 1 tablet Daily as needed for  Fluid Retention / Ankle Swelling 11/09/22  Yes [name], MD  gabapentin (NEURONTIN) 300 MG capsule TAKE 1 TO 2 CAPSULES BY MOUTH 1  TO 2 HOURS BEFORE BEDTIME AS  NEEDED FOR SLEEP 12/08/22  Yes Cranford, Archie Patten, NP  gemfibrozil (LOPID) 600 MG tablet TAKE 1 TABLET BY  MOUTH TWICE  DAILY WITH MEALS FOR CHOLESTEROL 07/21/22  Yes Raynelle Dick, NP  oxybutynin (DITROPAN-XL) 10 MG 24 hr tablet Take  1 tablet Daily for Bladder                                                                 /                                                     TAKE                       BY                                                 MOUTH Patient taking differently: Take 10 mg by mouth daily as needed (retention). Take  1 tablet Daily for Bladder                                                                 /                                                     TAKE  BY                                                 MOUTH 12/14/22  Yes [name], MD  triamcinolone (NASACORT) 55 MCG/ACT AERO nasal inhaler Place 2 sprays into the nose at bedtime. Patient taking differently: Place 2 sprays into the nose daily as needed (allergies). 07/01/21 12/24/22 Yes Raynelle Dick, NP    Physical Exam: Vitals:   12/24/22 1539 12/24/22 1615 12/24/22 1630 12/24/22 1645  BP:  128/71  122/66  Pulse:  (!) 59 60 (!) 59  Resp:  16 12 16   Temp: (!) 97.1 F (36.2 C)     TempSrc: Temporal     SpO2:  93% 92% 91%  Weight:      Height:        General: alert and oriented to time, place, and person. Appear in mild distress, affect appropriate Eyes: PERRL, Conjunctiva normal ENT: Oral Mucosa Clear, moist  Neck: no JVD, no Abnormal Mass Or lumps Cardiovascular: S1 and S2 Present, aortic systolic Murmur, peripheral pulses symmetrical Respiratory: good respiratory effort, Bilateral Air entry equal and Decreased, no signs of accessory muscle use, right Crackles, no wheezes Abdomen: Bowel Sound present, Soft and no tenderness, no hernia Skin: no rashes  Extremities: trace Pedal edema, no calf tenderness Neurologic: without any new focal findings, mental status, alert and oriented x3, speech normal, PERLA, and Motor strength 5/5 and symmetric  Data Reviewed: I have  personally reviewed and interpreted labs, imaging as discussed below.  CBC: Recent Labs  Lab 12/24/22 0733  WBC 5.1  NEUTROABS 2.1  HGB 14.6  HCT 44.9  MCV 92.6  PLT 238   Basic Metabolic Panel: Recent Labs  Lab 12/24/22 0733  NA 135  K 3.5  CL 104  CO2 19*  GLUCOSE 116*  BUN 15  CREATININE 0.84  CALCIUM 9.0   GFR: Estimated Creatinine Clearance: 67.2 mL/min (by C-G formula based on SCr of 0.84 mg/dL). Liver Function Tests: Recent Labs  Lab 12/24/22 0733  AST 41  ALT 18  ALKPHOS 61  BILITOT 0.7  PROT 7.7  ALBUMIN 3.7   Recent Labs  Lab 12/24/22 0733  LIPASE 32   No results for input(s): "AMMONIA" in the last 168 hours. Coagulation Profile: No results for input(s): "INR", "PROTIME" in the last 168 hours. Cardiac Enzymes: No results for input(s): "CKTOTAL", "CKMB", "CKMBINDEX", "TROPONINI" in the last 168 hours. BNP (last 3 results) No results for input(s): "PROBNP" in the last 8760 hours. HbA1C: No results for input(s): "HGBA1C" in the last 72 hours. CBG: No results for input(s): "GLUCAP" in the last 168 hours. Lipid Profile: No results for input(s): "CHOL", "HDL", "LDLCALC", "TRIG", "CHOLHDL", "LDLDIRECT" in the last 72 hours. Thyroid Function Tests: No results for input(s): "TSH", "T4TOTAL", "FREET4", "T3FREE", "THYROIDAB" in the last 72 hours. Anemia Panel: No results for input(s): "VITAMINB12", "FOLATE", "FERRITIN", "TIBC", "IRON", "RETICCTPCT" in the last 72 hours. Urine analysis:    Component Value Date/Time   COLORURINE STRAW (A) 12/24/2022 0752   APPEARANCEUR CLEAR 12/24/2022 0752   LABSPEC 1.011 12/24/2022 0752   PHURINE 6.0 12/24/2022 0752   GLUCOSEU NEGATIVE 12/24/2022 0752   HGBUR SMALL (A) 12/24/2022 0752   BILIRUBINUR NEGATIVE 12/24/2022 0752   KETONESUR NEGATIVE 12/24/2022 0752   PROTEINUR 100 (A) 12/24/2022 0752   NITRITE NEGATIVE 12/24/2022 0752  LEUKOCYTESUR NEGATIVE 12/24/2022 0752    Radiological Exams on Admission: CT  CHEST ABDOMEN PELVIS W CONTRAST  Result Date: 12/24/2022 CLINICAL DATA:  Shortness of breath, respiratory distress, pneumothorax EXAM: CT CHEST, ABDOMEN, AND PELVIS WITH CONTRAST TECHNIQUE: Multidetector CT imaging of the chest, abdomen and pelvis was performed following the standard protocol during bolus administration of intravenous contrast. RADIATION DOSE REDUCTION: This exam was performed according to the departmental dose-optimization program which includes automated exposure control, adjustment of the mA and/or kV according to patient size and/or use of iterative reconstruction technique. CONTRAST:  75mL OMNIPAQUE IOHEXOL 350 MG/ML SOLN COMPARISON:  Same-day chest radiographs, CT abdomen pelvis, 09/30/2009 FINDINGS: CT CHEST FINDINGS Cardiovascular: Aortic atherosclerosis. Dense aortic valve calcifications. Normal heart size. Scattered coronary artery calcifications. No pericardial effusion. Mediastinum/Nodes: No enlarged mediastinal, hilar, or axillary lymph nodes. Thyroid gland, trachea, and esophagus demonstrate no significant findings. Lungs/Pleura: Right-sided pigtail chest tube either traverses the parenchyma of the right lower lobe or an accessory segmental fissure, clearly posterior to the major fissure (e.g. Series 5, image 99), with associated contusion (e.g. Series 5, image 74). Tip of the catheter is situated posterior to the right upper lobe within the pleural space (series 5, image 54). Minimal, less than 5% residual right pneumothorax. Left basilar scarring or atelectasis with otherwise normally aerated left lung. Musculoskeletal: No chest wall abnormality. No acute osseous findings. CT ABDOMEN PELVIS FINDINGS Hepatobiliary: No solid liver abnormality is seen. No gallstones, gallbladder wall thickening, or biliary dilatation. Pancreas: Unremarkable. No pancreatic ductal dilatation or surrounding inflammatory changes. Spleen: Normal in size without significant abnormality. Adrenals/Urinary  Tract: Adrenal glands are unremarkable. Simple, benign bilateral renal cortical cysts, for which no further follow-up or characterization is required. Kidneys are otherwise normal, without renal calculi, solid lesion, or hydronephrosis. Bladder is unremarkable. Stomach/Bowel: Stomach is within normal limits. Appendix appears normal. No evidence of bowel wall thickening, distention, or inflammatory changes. Pancolonic diverticulosis. Vascular/Lymphatic: Aortic atherosclerosis. No enlarged abdominal or pelvic lymph nodes. Reproductive: Prostatomegaly. Other: No abdominal wall hernia or abnormality. No ascites. Musculoskeletal: No acute osseous findings. Incidental note of a large benign lipoma within the right gluteus maximus musculature (series 3, image 108) IMPRESSION: 1. Right-sided pigtail chest tube either traverses the parenchyma of the right lower lobe or an accessory segmental fissure, clearly posterior to the major fissure, with associated contusion. Tip of the catheter is situated posterior to the right upper lobe within the pleural space. Minimal, less than 5% residual right pneumothorax. 2. No acute CT findings of the abdomen or pelvis. 3. Pancolonic diverticulosis without evidence of acute diverticulitis. 4. Prostatomegaly. 5. Coronary artery disease. 6. Dense aortic valve calcifications. Correlate for echocardiographic evidence of aortic valve dysfunction. Aortic Atherosclerosis (ICD10-I70.0). Electronically Signed   By: Jearld Lesch M.D.   On: 12/24/2022 10:41   DG Chest Portable 1 View  Result Date: 12/24/2022 CLINICAL DATA:  88 year old male with chest tube placement for pneumothorax. EXAM: PORTABLE CHEST 1 VIEW COMPARISON:  0733 hours today. FINDINGS: Portable AP upright view at 0849 hours. Pigtail left chest tube placed with near complete resolution of the large pneumothorax. Small residual is visible at the apex. Lower lung volumes. Patchy lung base opacity most resembles atelectasis.  Tortuous thoracic aorta. Calcified aortic atherosclerosis. Negative visible bowel gas. No acute osseous abnormality identified. Severe degeneration at both shoulders. IMPRESSION: 1. Left chest tube placed with substantially reduced pneumothorax. Small residual at the apex. 2. Lower lung volumes with basilar atelectasis. Electronically Signed   By: Althea Grimmer.D.  On: 12/24/2022 09:17   DG Chest Port 1 View  Addendum Date: 12/24/2022   ADDENDUM REPORT: 12/24/2022 08:45 ADDENDUM: Critical Value/emergent results were called by telephone at the time of interpretation on 12/24/2022 at 8:39 a.m. to provider Pricilla Loveless , who verbally acknowledged these results. A right-sided chest tube has been placed. Electronically Signed   By: Orvan Falconer M.D.   On: 12/24/2022 08:45   Result Date: 12/24/2022 CLINICAL DATA:  Shortness of breath. EXAM: PORTABLE CHEST 1 VIEW COMPARISON:  Chest radiograph 10/07/2005. FINDINGS: Large right pneumothorax. Linear scarring in the left lung base. Normal heart size. Stable mediastinal contours with atherosclerotic calcifications of the aortic arch. IMPRESSION: Large right pneumothorax. Attempted to contact the referring physician by phone at 8:18 a.m. on 12/24/2022. Radiology assistant personnel have been notified to put me in telephone contact with the referring physician or the referring physician's clinical representative in order to discuss these findings. Once this communication is established I will issue an addendum to this report for documentation purposes. Electronically Signed: By: Orvan Falconer M.D. On: 12/24/2022 08:20   EKG: Independently reviewed. normal sinus rhythm, nonspecific ST and T waves changes. Echocardiogram: Ordered  I reviewed all nursing notes, pharmacy notes, vitals, pertinent old records.  Assessment/Plan Spontaneous pneumothorax Seen on arrival on chest x-ray. ED provider placed a chest tube in the patient. Pulmonary consulted. Most likely  associate with labs. CT chest ordered. Will monitor recommendation.  Pulmonary will be managing chest tube.  Elevated troponin likely demand ischemia. Hypertensive emergency. Presents with abdominal pain as well as shortness of breath. Blood pressure was 220/100 systolic at the time of EMS arrival. Currently blood pressure has improved significantly and almost normalized. High blood pressure most likely is a with pain. But patient does have elevated troponin. No ST-T wave changes to concern for acute ischemia. Troponins are peaking up to 850. Cardiology consulted.  Will monitor further recommendation.  Hyperlipidemia, mixed Continue home regimen not on statin though.  Essential hypertension Blood pressure now improving. For now we will monitor  Gastroesophageal reflux disease without esophagitis Continue PPI.  OSA on CPAP Avoiding CPAP in the setting of pneumothorax.  Constipation Continue bowel regimen.  Likely cause of the patient's initial abdominal pain symptoms.  Alcohol use Reports he drinks 2-3 drinks on a daily basis. CIWA protocol recommended.  Nutrition: Cardiac diet DVT Prophylaxis: Subcutaneous Lovenox  Advance goals of care discussion:   Code Status: Full Code   Consults: Cardiology, EDP discussed with PCCM.  Family Communication: family was present at bedside, at the time of interview.  Opportunity was given to ask question and all questions were answered satisfactorily.   Disposition:  From: Home Likely will need Home on discharge.   Author: Lynden Oxford, MD [facility] 12/24/2022 6:28 PM   To reach On-call, see care teams to locate the attending and reach out to them via [email or web address]. If 7PM-7AM, please contact night-coverage If you still have difficulty reaching the attending provider, please page the [facility] (Director on Call) for [facility] on amion for assistance.

## 2022-12-24 NOTE — ED Triage Notes (Signed)
Pt BIB EMS due to resp distress. Fire put pt on cpap. Pt arrives on 4L of O2. C/O stomach distention and bilateral leg swelling. No hx of CHF. Axox4. Pt in no apparent distress at this time. EMS states HTN on 220 systolic.

## 2022-12-24 NOTE — ED Notes (Signed)
RN sent down another light green tube per labs request

## 2022-12-24 NOTE — Consult Note (Signed)
NAME:  Jeremy Boone, MRN:  [id number], DOB:  10/11/1934, LOS: 0 ADMISSION DATE:  12/24/2022, CONSULTATION DATE:  12/24/22 REFERRING MD:  EDP, CHIEF COMPLAINT:  Abd pain   History of Present Illness:  88 year old man w/ hx of HTN presenting with rather sudden onset R abdominal pain today followed by shortness of breath.  CXR showing large PTX.  Pigtail placed in ER. PCCM to manage. Nonsmoker, no hx of trauma, no prior lung hx. CT chest has just been done  Pertinent  Medical History  HTN HLD Gout  Significant Hospital Events: Including procedures, antibiotic start and stop dates in addition to other pertinent events   10/24 admit  Interim History / Subjective:  Consult  Objective   Blood pressure 102/76, pulse 66, temperature 97.6 F (36.4 C), temperature source Oral, resp. rate 13, height 5\' 11"  (1.803 m), weight 80.7 kg, SpO2 97%.       No intake or output data in the 24 hours ending 12/24/22 0950 Filed Weights   12/24/22 0722  Weight: 80.7 kg    Examination: General: no distress HENT: MMM, trachea midline Lungs: reduced on R somewhat, nonlabored breathing pattern; atrium without tidaling or air leak Cardiovascular: RRR, ext warm Abdomen: soft, nontender, +BS Extremities: no edema Neuro: moves to command Psych: Aox3, good insight  Bicarb slightly low Minimally elevated Trop Lipase okay CMP ok CT personally reviewed  Resolved Hospital Problem list   N/A  Assessment & Plan:  Primary spontaneous PTX- does have ipsilateral blebs.  No preceding cause identified.  Given age would just watch with tube for now and see if we can achieve pleurodesis on suction.  - Pigtail to -20 - f/u formal read on CT chest - Will follow  Best Practice (right click and "Reselect all SmartList Selections" daily)  Per primary  Labs   CBC: Recent Labs  Lab 12/24/22 0733  WBC 5.1  NEUTROABS 2.1  HGB 14.6  HCT 44.9  MCV 92.6  PLT 238    Basic Metabolic Panel: Recent  Labs  Lab 12/24/22 0733  NA 135  K 3.5  CL 104  CO2 19*  GLUCOSE 116*  BUN 15  CREATININE 0.84  CALCIUM 9.0   GFR: Estimated Creatinine Clearance: 67.2 mL/min (by C-G formula based on SCr of 0.84 mg/dL). Recent Labs  Lab 12/24/22 0733  WBC 5.1    Liver Function Tests: Recent Labs  Lab 12/24/22 0733  AST 41  ALT 18  ALKPHOS 61  BILITOT 0.7  PROT 7.7  ALBUMIN 3.7   Recent Labs  Lab 12/24/22 0733  LIPASE 32   No results for input(s): "AMMONIA" in the last 168 hours.  ABG No results found for: "PHART", "PCO2ART", "PO2ART", "HCO3", "TCO2", "ACIDBASEDEF", "O2SAT"   Coagulation Profile: No results for input(s): "INR", "PROTIME" in the last 168 hours.  Cardiac Enzymes: No results for input(s): "CKTOTAL", "CKMB", "CKMBINDEX", "TROPONINI" in the last 168 hours.  HbA1C: Hgb A1c MFr Bld  Date/Time Value Ref Range Status  11/09/2022 03:30 PM 6.4 (H) <5.7 % of total Hgb Final    Comment:    For someone without known diabetes, a hemoglobin  A1c value between 5.7% and 6.4% is consistent with prediabetes and should be confirmed with a  follow-up test. . For someone with known diabetes, a value <7% indicates that their diabetes is well controlled. A1c targets should be individualized based on duration of diabetes, age, comorbid conditions, and other considerations. . This assay result is consistent with an  increased risk of diabetes. . Currently, no consensus exists regarding use of hemoglobin A1c for diagnosis of diabetes for children. Marland Kitchen   04/21/2022 11:20 AM 6.1 (H) <5.7 % of total Hgb Final    Comment:    For someone without known diabetes, a hemoglobin  A1c value between 5.7% and 6.4% is consistent with prediabetes and should be confirmed with a  follow-up test. . For someone with known diabetes, a value <7% indicates that their diabetes is well controlled. A1c targets should be individualized based on duration of diabetes, age, comorbid conditions,  and other considerations. . This assay result is consistent with an increased risk of diabetes. . Currently, no consensus exists regarding use of hemoglobin A1c for diagnosis of diabetes for children. .     CBG: No results for input(s): "GLUCAP" in the last 168 hours.  Review of Systems:    Positive Symptoms in bold:  Constitutional fevers, chills, weight loss, fatigue, anorexia, malaise  Eyes decreased vision, double vision, eye irritation  Ears, Nose, Mouth, Throat sore throat, trouble swallowing, sinus congestion  Cardiovascular chest pain, paroxysmal nocturnal dyspnea, lower ext edema, palpitations   Respiratory SOB, cough, DOE, hemoptysis, wheezing  Gastrointestinal nausea, vomiting, diarrhea  Genitourinary burning with urination, trouble urinating  Musculoskeletal joint aches, joint swelling, back pain  Integumentary  rashes, skin lesions  Neurological focal weakness, focal numbness, trouble speaking, headaches  Psychiatric depression, anxiety, confusion  Endocrine polyuria, polydipsia, cold intolerance, heat intolerance  Hematologic abnormal bruising, abnormal bleeding, unexplained nose bleeds  Allergic/Immunologic recurrent infections, hives, swollen lymph nodes     Past Medical History:  He,  has a past medical history of Gout, Hyperlipidemia, Hypertension, and Prediabetes.   Surgical History:   Past Surgical History:  Procedure Laterality Date   Cardiac Event Monitor  May-June 2017   Sinus rhythm with average heart rate 74.2 - no notable arrhythmias. Minimal PVCs. No PACs.   CARPAL TUNNEL RELEASE Left 2007   CATARACT EXTRACTION, BILATERAL     OTHER SURGICAL HISTORY  2007   Negative biopsy tonsil mass   ROTATOR CUFF REPAIR Left 2000     Social History:   reports that he has never smoked. He has never used smokeless tobacco. He reports current alcohol use of about 15.0 standard drinks of alcohol per week. He reports that he does not use drugs.   Family  History:  His family history includes Hypertension in his mother; Seizures in his sister; Stroke in his father. There is no history of Colon cancer, Stomach cancer, Esophageal cancer, Inflammatory bowel disease, Liver disease, Pancreatic cancer, or Rectal cancer.   Allergies Allergies  Allergen Reactions   Ace Inhibitors Cough   Lipitor [Atorvastatin]     Elevates CPK and Aldolase     Home Medications  Prior to Admission medications   Medication Sig Start Date End Date Taking? Authorizing Provider  allopurinol (ZYLOPRIM) 300 MG tablet Take  1 tablet  Daily for For Gout Prevention                                                                /  TAKE                                         BY                                                 MOUTH 10/24/22  Yes [name], MD  Cholecalciferol (VITAMIN D PO) Take 5,000 Int'l Units by mouth daily.   Yes [provider]  ezetimibe (ZETIA) 10 MG tablet TAKE 1 TABLET BY MOUTH  DAILY FOR CHOLESTEROL 04/20/22  Yes Cranford, Archie Patten, NP  famotidine (PEPCID) 20 MG tablet Take 1 tablet  2 x / day before Breakfast & Supper to Prevent Heartburn & Indigestion 04/27/22  Yes [name], MD  furosemide (LASIX) 40 MG tablet Take 1 tablet Daily as needed for  Fluid Retention / Ankle Swelling 11/09/22  Yes [name], MD  gabapentin (NEURONTIN) 300 MG capsule TAKE 1 TO 2 CAPSULES BY MOUTH 1  TO 2 HOURS BEFORE BEDTIME AS  NEEDED FOR SLEEP 12/08/22  Yes Cranford, Archie Patten, NP  gemfibrozil (LOPID) 600 MG tablet TAKE 1 TABLET BY MOUTH TWICE  DAILY WITH MEALS FOR CHOLESTEROL 07/21/22  Yes Raynelle Dick, NP  oxybutynin (DITROPAN-XL) 10 MG 24 hr tablet Take  1 tablet Daily for Bladder                                                                 /                                                     TAKE                       BY                                                 MOUTH Patient  taking differently: Take 10 mg by mouth daily as needed (retention). Take  1 tablet Daily for Bladder                                                                 /                                                     TAKE  BY                                                 MOUTH 12/14/22  Yes [name], MD  triamcinolone (NASACORT) 55 MCG/ACT AERO nasal inhaler Place 2 sprays into the nose at bedtime. Patient taking differently: Place 2 sprays into the nose daily as needed (allergies). 07/01/21 12/24/22 Yes Raynelle Dick, NP     Critical care time: N/A

## 2022-12-24 NOTE — Consult Note (Signed)
Cardiology Consultation   Patient ID: Jeremy Boone MRN: [id number]; DOB: [birth year]  Admit date: 12/24/2022 Date of Consult: 12/24/2022  PCP:  [name], MD   [facility] Providers Cardiologist: New, Dr. Scharlene Gloss  Patient Profile:   Jeremy Boone is a 88 y.o. male with a hx of HTN, HLD, Gout, OSA on Cpap who is being seen 12/24/2022 for the evaluation of elevated troponin at the request of Dr. Allena Katz.  History of Present Illness:   [name] is a 88 yo male with PMH noted above. He follows with a  PCP on a regular basis. He lives independently, cares for himself. Denies any anginal symptoms with his daily activities. He was seen back in 2018 with Dr. Herbie Baltimore in the setting of exertional dyspnea. Stress testing was considered but the patient did not want to proceed.   He presented to the ED on 10/24 with complaints of right sided abd pain and significant shortness of breath. Reports he was sitting in the bed and developed abrupt symptoms. Called EMS and found to be sitting up in the bed using his Cpap with significant work of breathing. O2 sats noted at 86% and Bp 220/110.   In the ED his labs showed Na+ 135, K+ 3.5, Cr 0.84, BNP 21, Troponin 120>>858, WBC 5.1, Hgb 14.6. CXR with large R pneumothorax. Underwent pigtail placement in the ED. CT c/a/p with improvement in pneumothorax to less than 5%. EKG showed nonspecific changes. Cardiology asked to evaluate.    Past Medical History:  Diagnosis Date   Gout    Hyperlipidemia    Hypertension    Prediabetes     Past Surgical History:  Procedure Laterality Date   Cardiac Event Monitor  May-June 2017   Sinus rhythm with average heart rate 74.2 - no notable arrhythmias. Minimal PVCs. No PACs.   CARPAL TUNNEL RELEASE Left 2007   CATARACT EXTRACTION, BILATERAL     OTHER SURGICAL HISTORY  2007   Negative biopsy tonsil mass   ROTATOR CUFF REPAIR Left 2000     Home Medications:  Prior to Admission medications    Medication Sig Start Date End Date Taking? Authorizing Provider  allopurinol (ZYLOPRIM) 300 MG tablet Take  1 tablet  Daily for For Gout Prevention                                                                /                                                                   TAKE                                         BY  MOUTH 10/24/22  Yes [name], MD  Cholecalciferol (VITAMIN D PO) Take 5,000 Int'l Units by mouth daily.   Yes [provider]  ezetimibe (ZETIA) 10 MG tablet TAKE 1 TABLET BY MOUTH  DAILY FOR CHOLESTEROL 04/20/22  Yes Cranford, Archie Patten, NP  famotidine (PEPCID) 20 MG tablet Take 1 tablet  2 x / day before Breakfast & Supper to Prevent Heartburn & Indigestion 04/27/22  Yes [name], MD  furosemide (LASIX) 40 MG tablet Take 1 tablet Daily as needed for  Fluid Retention / Ankle Swelling 11/09/22  Yes [name], MD  gabapentin (NEURONTIN) 300 MG capsule TAKE 1 TO 2 CAPSULES BY MOUTH 1  TO 2 HOURS BEFORE BEDTIME AS  NEEDED FOR SLEEP 12/08/22  Yes Cranford, Archie Patten, NP  gemfibrozil (LOPID) 600 MG tablet TAKE 1 TABLET BY MOUTH TWICE  DAILY WITH MEALS FOR CHOLESTEROL 07/21/22  Yes Raynelle Dick, NP  oxybutynin (DITROPAN-XL) 10 MG 24 hr tablet Take  1 tablet Daily for Bladder                                                                 /                                                     TAKE                       BY                                                 MOUTH Patient taking differently: Take 10 mg by mouth daily as needed (retention). Take  1 tablet Daily for Bladder                                                                 /                                                     TAKE                       BY                                                 MOUTH 12/14/22  Yes [name], MD  triamcinolone (NASACORT) 55 MCG/ACT AERO nasal inhaler Place 2 sprays into the nose at bedtime. Patient  taking differently: Place 2 sprays into  the nose daily as needed (allergies). 07/01/21 12/24/22 Yes Raynelle Dick, NP    Inpatient Medications: Scheduled Meds:  allopurinol  300 mg Oral Daily   amLODipine  2.5 mg Oral Daily   benzonatate  100 mg Oral TID   docusate sodium  100 mg Oral BID   enoxaparin (LOVENOX) injection  40 mg Subcutaneous Q24H   ezetimibe  10 mg Oral Daily   famotidine  20 mg Oral Daily   gabapentin  300 mg Oral QHS   gemfibrozil  600 mg Oral BID AC   guaiFENesin  600 mg Oral BID   sodium chloride flush  10 mL Intrapleural Q8H   Continuous Infusions:  PRN Meds: acetaminophen **OR** acetaminophen, albuterol, hydrALAZINE, morphine injection, ondansetron **OR** ondansetron (ZOFRAN) IV, oxyCODONE  Allergies:    Allergies  Allergen Reactions   Ace Inhibitors Cough   Lipitor [Atorvastatin]     Elevates CPK and Aldolase    Social History:   Social History   Socioeconomic History   Marital status: Married    Spouse name: Not on file   Number of children: Not on file   Years of education: Not on file   Highest education level: Not on file  Occupational History   Not on file  Tobacco Use   Smoking status: Never   Smokeless tobacco: Never  Vaping Use   Vaping status: Never Used  Substance and Sexual Activity   Alcohol use: Yes    Alcohol/week: 15.0 standard drinks of alcohol    Types: 15 Standard drinks or equivalent per week    Comment: every day 2-3 drimks a day    Drug use: No   Sexual activity: Not on file  Other Topics Concern   Not on file  Social History Narrative   Not on file   Social Determinants of Health   Financial Resource Strain: Not on file  Food Insecurity: Not on file  Transportation Needs: Not on file  Physical Activity: Not on file  Stress: Not on file  Social Connections: Not on file  Intimate Partner Violence: Not on file    Family History:    Family History  Problem Relation Age of Onset   Hypertension Mother     Stroke Father    Seizures Sister    Colon cancer Neg Hx    Stomach cancer Neg Hx    Esophageal cancer Neg Hx    Inflammatory bowel disease Neg Hx    Liver disease Neg Hx    Pancreatic cancer Neg Hx    Rectal cancer Neg Hx      ROS:  Please see the history of present illness.   All other ROS reviewed and negative.     Physical Exam/Data:   Vitals:   12/24/22 1300 12/24/22 1315 12/24/22 1330 12/24/22 1415  BP: (!) 148/117 (!) 163/149 (!) 163/83 133/75  Pulse: 67 69 71 60  Resp: 18 16 18 16   Temp:      TempSrc:      SpO2: 96% 99% 98% 100%  Weight:      Height:       No intake or output data in the 24 hours ending 12/24/22 1451    12/24/2022    7:22 AM 11/09/2022    3:29 PM 08/26/2022    9:19 AM  Last 3 Weights  Weight (lbs) 178 lb 178 lb 6.4 oz 173 lb 12.8 oz  Weight (kg) 80.74 kg 80.922 kg 78.835 kg     Body mass  index is 24.83 kg/m.  General:  Well nourished, well developed, in no acute distress HEENT: normal Neck: no JVD Vascular: No carotid bruits; Distal pulses 2+ bilaterally Cardiac:  normal S1, S2; RRR; + systolic murmur RUSB  Lungs:  clear to auscultation bilaterally, right pigtail catheter noted Abd: soft, nontender, no hepatomegaly  Ext: no edema Musculoskeletal:  No deformities, BUE and BLE strength normal and equal Skin: warm and dry  Neuro:  CNs 2-12 intact, no focal abnormalities noted Psych:  Normal affect   EKG:  The EKG was personally reviewed and demonstrates:  Sinus Rhythm 88bpm, nonspecific changes   Relevant CV Studies:  N/a   Laboratory Data:  High Sensitivity Troponin:   Recent Labs  Lab 12/24/22 0733 12/24/22 1100  TROPONINIHS 120* 858*     Chemistry Recent Labs  Lab 12/24/22 0733  NA 135  K 3.5  CL 104  CO2 19*  GLUCOSE 116*  BUN 15  CREATININE 0.84  CALCIUM 9.0  GFRNONAA >60  ANIONGAP 12    Recent Labs  Lab 12/24/22 0733  PROT 7.7  ALBUMIN 3.7  AST 41  ALT 18  ALKPHOS 61  BILITOT 0.7   Lipids No results  for input(s): "CHOL", "TRIG", "HDL", "LABVLDL", "LDLCALC", "CHOLHDL" in the last 168 hours.  Hematology Recent Labs  Lab 12/24/22 0733  WBC 5.1  RBC 4.85  HGB 14.6  HCT 44.9  MCV 92.6  MCH 30.1  MCHC 32.5  RDW 13.9  PLT 238   Thyroid No results for input(s): "TSH", "FREET4" in the last 168 hours.  BNP Recent Labs  Lab 12/24/22 0733  BNP 21.8    DDimer No results for input(s): "DDIMER" in the last 168 hours.   Radiology/Studies:  CT CHEST ABDOMEN PELVIS W CONTRAST  Result Date: 12/24/2022 CLINICAL DATA:  Shortness of breath, respiratory distress, pneumothorax EXAM: CT CHEST, ABDOMEN, AND PELVIS WITH CONTRAST TECHNIQUE: Multidetector CT imaging of the chest, abdomen and pelvis was performed following the standard protocol during bolus administration of intravenous contrast. RADIATION DOSE REDUCTION: This exam was performed according to the departmental dose-optimization program which includes automated exposure control, adjustment of the mA and/or kV according to patient size and/or use of iterative reconstruction technique. CONTRAST:  75mL OMNIPAQUE IOHEXOL 350 MG/ML SOLN COMPARISON:  Same-day chest radiographs, CT abdomen pelvis, 09/30/2009 FINDINGS: CT CHEST FINDINGS Cardiovascular: Aortic atherosclerosis. Dense aortic valve calcifications. Normal heart size. Scattered coronary artery calcifications. No pericardial effusion. Mediastinum/Nodes: No enlarged mediastinal, hilar, or axillary lymph nodes. Thyroid gland, trachea, and esophagus demonstrate no significant findings. Lungs/Pleura: Right-sided pigtail chest tube either traverses the parenchyma of the right lower lobe or an accessory segmental fissure, clearly posterior to the major fissure (e.g. Series 5, image 99), with associated contusion (e.g. Series 5, image 74). Tip of the catheter is situated posterior to the right upper lobe within the pleural space (series 5, image 54). Minimal, less than 5% residual right pneumothorax.  Left basilar scarring or atelectasis with otherwise normally aerated left lung. Musculoskeletal: No chest wall abnormality. No acute osseous findings. CT ABDOMEN PELVIS FINDINGS Hepatobiliary: No solid liver abnormality is seen. No gallstones, gallbladder wall thickening, or biliary dilatation. Pancreas: Unremarkable. No pancreatic ductal dilatation or surrounding inflammatory changes. Spleen: Normal in size without significant abnormality. Adrenals/Urinary Tract: Adrenal glands are unremarkable. Simple, benign bilateral renal cortical cysts, for which no further follow-up or characterization is required. Kidneys are otherwise normal, without renal calculi, solid lesion, or hydronephrosis. Bladder is unremarkable. Stomach/Bowel: Stomach is within normal limits.  Appendix appears normal. No evidence of bowel wall thickening, distention, or inflammatory changes. Pancolonic diverticulosis. Vascular/Lymphatic: Aortic atherosclerosis. No enlarged abdominal or pelvic lymph nodes. Reproductive: Prostatomegaly. Other: No abdominal wall hernia or abnormality. No ascites. Musculoskeletal: No acute osseous findings. Incidental note of a large benign lipoma within the right gluteus maximus musculature (series 3, image 108) IMPRESSION: 1. Right-sided pigtail chest tube either traverses the parenchyma of the right lower lobe or an accessory segmental fissure, clearly posterior to the major fissure, with associated contusion. Tip of the catheter is situated posterior to the right upper lobe within the pleural space. Minimal, less than 5% residual right pneumothorax. 2. No acute CT findings of the abdomen or pelvis. 3. Pancolonic diverticulosis without evidence of acute diverticulitis. 4. Prostatomegaly. 5. Coronary artery disease. 6. Dense aortic valve calcifications. Correlate for echocardiographic evidence of aortic valve dysfunction. Aortic Atherosclerosis (ICD10-I70.0). Electronically Signed   By: Jearld Lesch M.D.   On:  12/24/2022 10:41   DG Chest Portable 1 View  Result Date: 12/24/2022 CLINICAL DATA:  88 year old male with chest tube placement for pneumothorax. EXAM: PORTABLE CHEST 1 VIEW COMPARISON:  0733 hours today. FINDINGS: Portable AP upright view at 0849 hours. Pigtail left chest tube placed with near complete resolution of the large pneumothorax. Small residual is visible at the apex. Lower lung volumes. Patchy lung base opacity most resembles atelectasis. Tortuous thoracic aorta. Calcified aortic atherosclerosis. Negative visible bowel gas. No acute osseous abnormality identified. Severe degeneration at both shoulders. IMPRESSION: 1. Left chest tube placed with substantially reduced pneumothorax. Small residual at the apex. 2. Lower lung volumes with basilar atelectasis. Electronically Signed   By: Odessa Fleming M.D.   On: 12/24/2022 09:17   DG Chest Port 1 View  Addendum Date: 12/24/2022   ADDENDUM REPORT: 12/24/2022 08:45 ADDENDUM: Critical Value/emergent results were called by telephone at the time of interpretation on 12/24/2022 at 8:39 a.m. to provider Pricilla Loveless , who verbally acknowledged these results. A right-sided chest tube has been placed. Electronically Signed   By: Orvan Falconer M.D.   On: 12/24/2022 08:45   Result Date: 12/24/2022 CLINICAL DATA:  Shortness of breath. EXAM: PORTABLE CHEST 1 VIEW COMPARISON:  Chest radiograph 10/07/2005. FINDINGS: Large right pneumothorax. Linear scarring in the left lung base. Normal heart size. Stable mediastinal contours with atherosclerotic calcifications of the aortic arch. IMPRESSION: Large right pneumothorax. Attempted to contact the referring physician by phone at 8:18 a.m. on 12/24/2022. Radiology assistant personnel have been notified to put me in telephone contact with the referring physician or the referring physician's clinical representative in order to discuss these findings. Once this communication is established I will issue an addendum to this  report for documentation purposes. Electronically Signed: By: Orvan Falconer M.D. On: 12/24/2022 08:20     Assessment and Plan:   Cillian Vallee is a 88 y.o. male with a hx of HTN, HLD, Gout who is being seen 12/24/2022 for the evaluation of elevated troponin at the request of Dr. Allena Katz.  Right sided pneumothorax -- presented with right sided abd pain. CXR with large right sided pneumothorax. Seen by PCCM, s/p right pigtail placed in the ED. Has ipsilateral blebs -- management per PCCM  Elevated Troponin -- denies any anginal symptoms PTA -- hsTn 100>>800, EKG with nonspecific changes -- review of CT chest with MD shows minimal CAD -- suspect demand ischemia in the setting of pneumo -- check echo  Systolic murmur -- suspect aortic stenosis -- check echo  Hypertension -- blood  pressures initially elevated on admission, now improved -- continue   Hyperlipidemia -- on statin   For questions or updates, please contact [facility] Please consult [email or web address] for contact info under    Signed, Laverda Page, NP  12/24/2022 2:51 PM

## 2022-12-24 NOTE — ED Provider Notes (Signed)
[place] EMERGENCY DEPARTMENT AT [facility] Provider Note   CSN: [id number] Arrival date & time: 12/24/22  1191     History  Chief Complaint  Patient presents with   Respiratory Distress   Shortness of Breath    Jeremy Boone is a 88 y.o. male.  HPI 88 year old male presents with right-sided abdominal pain and shortness of breath.  History is from patient and paramedic.  Patient's pain started about an hour and a half prior to arrival in the right side of his abdomen.  It was more severe than it is now and is about a 5 out of 10.  Did not radiate. He was also short of breath and EMS reports that the first paramedic to arrive put him on CPAP due to work of breathing.  He seemed to be doing very well on this and so the paramedic who brought him in put him to nasal cannula and his respiratory rate remained improved.  The patient feels like his shortness of breath is better.  He feels like his abdomen is distended since this morning.  He denies nausea or vomiting or diarrhea but has been constipated.  He denies any chest pain, cough, fever.  He has on and off leg swelling for which she takes as needed Lasix but the last time he had leg swelling was about a week ago. He is 86% O2 sat on room air here. EMS reports initial BP was 220/110, now down into 160s.  Home Medications Prior to Admission medications   Medication Sig Start Date End Date Taking? Authorizing Provider  allopurinol (ZYLOPRIM) 300 MG tablet Take  1 tablet  Daily for For Gout Prevention                                                                /                                                                   TAKE                                         BY                                                 MOUTH 10/24/22  Yes [name], MD  Cholecalciferol (VITAMIN D PO) Take 5,000 Int'l Units by mouth daily.   Yes [provider]  ezetimibe (ZETIA) 10 MG tablet TAKE 1 TABLET BY MOUTH  DAILY FOR  CHOLESTEROL 04/20/22  Yes Cranford, Archie Patten, NP  famotidine (PEPCID) 20 MG tablet Take 1 tablet  2 x / day before Breakfast & Supper to Prevent Heartburn & Indigestion 04/27/22  Yes [name], MD  furosemide (LASIX) 40 MG tablet Take 1 tablet Daily as needed for  Fluid  Retention / Ankle Swelling 11/09/22  Yes [name], MD  gabapentin (NEURONTIN) 300 MG capsule TAKE 1 TO 2 CAPSULES BY MOUTH 1  TO 2 HOURS BEFORE BEDTIME AS  NEEDED FOR SLEEP 12/08/22  Yes Cranford, Tonya, NP  gemfibrozil (LOPID) 600 MG tablet TAKE 1 TABLET BY MOUTH TWICE  DAILY WITH MEALS FOR CHOLESTEROL 07/21/22  Yes Raynelle Dick, NP  oxybutynin (DITROPAN-XL) 10 MG 24 hr tablet Take  1 tablet Daily for Bladder                                                                 /                                                     TAKE                       BY                                                 MOUTH Patient taking differently: Take 10 mg by mouth daily as needed (retention). Take  1 tablet Daily for Bladder                                                                 /                                                     TAKE                       BY                                                 MOUTH 12/14/22  Yes [name], MD  triamcinolone (NASACORT) 55 MCG/ACT AERO nasal inhaler Place 2 sprays into the nose at bedtime. Patient taking differently: Place 2 sprays into the nose daily as needed (allergies). 07/01/21 12/24/22 Yes Raynelle Dick, NP      Allergies    Ace inhibitors and Lipitor [atorvastatin]    Review of Systems   Review of Systems  Constitutional:  Negative for fever.  Respiratory:  Positive for shortness of breath. Negative for cough.   Cardiovascular:  Negative for chest pain and leg swelling.  Gastrointestinal:  Positive for abdominal distention and abdominal pain. Negative for diarrhea and vomiting.  Genitourinary:  Negative for dysuria.    Physical  Exam Updated Vital  Signs BP 137/86   Pulse 87   Temp 97.6 F (36.4 C) (Oral)   Resp 17   Ht 5\' 11"  (1.803 m)   Wt 80.7 kg   SpO2 94%   BMI 24.83 kg/m  Physical Exam Vitals and nursing note reviewed.  Constitutional:      General: He is not in acute distress.    Appearance: He is well-developed. He is not ill-appearing or diaphoretic.  HENT:     Head: Normocephalic and atraumatic.  Cardiovascular:     Rate and Rhythm: Normal rate and regular rhythm.     Heart sounds: Normal heart sounds.  Pulmonary:     Effort: Pulmonary effort is normal. Tachypnea present. No accessory muscle usage or respiratory distress.     Breath sounds: Examination of the right-lower field reveals decreased breath sounds. Decreased breath sounds present.  Abdominal:     Palpations: Abdomen is soft.     Tenderness: There is abdominal tenderness in the right upper quadrant and right lower quadrant.  Musculoskeletal:     Right lower leg: No edema.     Left lower leg: No edema.  Skin:    General: Skin is warm and dry.  Neurological:     Mental Status: He is alert.     ED Results / Procedures / Treatments   Labs (all labs ordered are listed, but only abnormal results are displayed) Labs Reviewed  COMPREHENSIVE METABOLIC PANEL - Abnormal; Notable for the following components:      Result Value   CO2 19 (*)    Glucose, Bld 116 (*)    All other components within normal limits  URINALYSIS, W/ REFLEX TO CULTURE (INFECTION SUSPECTED) - Abnormal; Notable for the following components:   Color, Urine STRAW (*)    Hgb urine dipstick SMALL (*)    Protein, ur 100 (*)    All other components within normal limits  RESP PANEL BY RT-PCR (RSV, FLU A&B, COVID)  RVPGX2  BRAIN NATRIURETIC PEPTIDE  CBC WITH DIFFERENTIAL/PLATELET  LIPASE, BLOOD  TROPONIN I (HIGH SENSITIVITY)  TROPONIN I (HIGH SENSITIVITY)    EKG EKG Interpretation Date/Time:  Thursday December 24 2022 07:31:53 EDT Ventricular Rate:  88 PR Interval:  201 QRS  Duration:  71 QT Interval:  321 QTC Calculation: 389 R Axis:   36  Text Interpretation: Sinus rhythm Posterior infarct, old Repol abnrm suggests ischemia, diffuse leads ST/T changes new since 2007 Confirmed by Pricilla Loveless [phone]) on 12/24/2022 7:34:40 AM  Radiology DG Chest Portable 1 View  Result Date: 12/24/2022 CLINICAL DATA:  88 year old male with chest tube placement for pneumothorax. EXAM: PORTABLE CHEST 1 VIEW COMPARISON:  0733 hours today. FINDINGS: Portable AP upright view at 0849 hours. Pigtail left chest tube placed with near complete resolution of the large pneumothorax. Small residual is visible at the apex. Lower lung volumes. Patchy lung base opacity most resembles atelectasis. Tortuous thoracic aorta. Calcified aortic atherosclerosis. Negative visible bowel gas. No acute osseous abnormality identified. Severe degeneration at both shoulders. IMPRESSION: 1. Left chest tube placed with substantially reduced pneumothorax. Small residual at the apex. 2. Lower lung volumes with basilar atelectasis. Electronically Signed   By: Odessa Fleming M.D.   On: 12/24/2022 09:17   DG Chest Port 1 View  Addendum Date: 12/24/2022   ADDENDUM REPORT: 12/24/2022 08:45 ADDENDUM: Critical Value/emergent results were called by telephone at the time of interpretation on 12/24/2022 at 8:39 a.m. to provider Pricilla Loveless , who verbally acknowledged these  results. A right-sided chest tube has been placed. Electronically Signed   By: Orvan Falconer M.D.   On: 12/24/2022 08:45   Result Date: 12/24/2022 CLINICAL DATA:  Shortness of breath. EXAM: PORTABLE CHEST 1 VIEW COMPARISON:  Chest radiograph 10/07/2005. FINDINGS: Large right pneumothorax. Linear scarring in the left lung base. Normal heart size. Stable mediastinal contours with atherosclerotic calcifications of the aortic arch. IMPRESSION: Large right pneumothorax. Attempted to contact the referring physician by phone at 8:18 a.m. on 12/24/2022. Radiology  assistant personnel have been notified to put me in telephone contact with the referring physician or the referring physician's clinical representative in order to discuss these findings. Once this communication is established I will issue an addendum to this report for documentation purposes. Electronically Signed: By: Orvan Falconer M.D. On: 12/24/2022 08:20    Procedures CHEST TUBE INSERTION  Date/Time: 12/24/2022 8:37 AM  Performed by: Pricilla Loveless, MD Authorized by: Pricilla Loveless, MD   Consent:    Consent obtained:  Verbal and written   Consent given by:  Patient   Risks, benefits, and alternatives were discussed: yes     Risks discussed:  Bleeding, damage to surrounding structures, infection, incomplete drainage, nerve damage and pain Universal protocol:    Patient identity confirmed:  Verbally with patient Pre-procedure details:    Skin preparation:  Chlorhexidine Sedation:    Sedation type:  None Anesthesia:    Anesthesia method:  Local infiltration   Local anesthetic:  Lidocaine 2% WITH epi Procedure details:    Placement location:  R lateral   Tube size (Fr):  Minicatheter   Tension pneumothorax: no     Tube connected to:  Suction   Drainage characteristics:  Air only   Suture material:  0 silk Post-procedure details:    Post-insertion x-ray findings: tube in good position     Procedure completion:  Tolerated well, no immediate complications .Critical Care  Performed by: Pricilla Loveless, MD Authorized by: Pricilla Loveless, MD   Critical care provider statement:    Critical care time (minutes):  35   Critical care time was exclusive of:  Separately billable procedures and treating other patients   Critical care was necessary to treat or prevent imminent or life-threatening deterioration of the following conditions:  Respiratory failure   Critical care was time spent personally by me on the following activities:  Development of treatment plan with patient or  surrogate, discussions with consultants, evaluation of patient's response to treatment, examination of patient, ordering and review of laboratory studies, ordering and review of radiographic studies, ordering and performing treatments and interventions, pulse oximetry, re-evaluation of patient's condition and review of old charts     Medications Ordered in ED Medications  lidocaine-EPINEPHrine (XYLOCAINE W/EPI) 2 %-1:200000 (PF) injection 20 mL (20 mLs Infiltration Given 12/24/22 0816)  fentaNYL (SUBLIMAZE) injection 50 mcg (50 mcg Intravenous Given 12/24/22 0816)  iohexol (OMNIPAQUE) 350 MG/ML injection 75 mL (75 mLs Intravenous Contrast Given 12/24/22 0913)    ED Course/ Medical Decision Making/ A&P                                 Medical Decision Making Amount and/or Complexity of Data Reviewed Independent Historian: EMS Labs: ordered.    Details: Normal WBC, unremarkable electrolytes Radiology: ordered and independent interpretation performed.    Details: Right sided pneumothorax ECG/medicine tests: ordered and independent interpretation performed.    Details: Diffuse ST changes  Risk Prescription  drug management. Decision regarding hospitalization.   Patient presents with right-sided abdominal pain which is probably referred pain from his right sided pneumothorax.  He is not distressed though requiring multiple liters of oxygen and tachypneic.  Chest x-ray shows large pneumothorax.  Chest tube placed as above.  He is feeling better after this.  He is also been given some fentanyl.  I discussed with pulmonology, Dr. Katrinka Blazing, who will help manage the chest tube.  He will need a hospitalist admission.  Discussed with Dr. Allena Katz.  Given the area of pain and no other clear cause for his pneumothorax, chest abdomen pelvis CT has been ordered.  He denies any falls.  I have also discussed with his son to update family.        Final Clinical Impression(s) / ED Diagnoses Final  diagnoses:  Spontaneous pneumothorax    Rx / DC Orders ED Discharge Orders     None         Pricilla Loveless, MD 12/24/22 [phone]

## 2022-12-24 NOTE — ED Notes (Signed)
Pt placed on 4L of O2, MD at bedside

## 2022-12-25 ENCOUNTER — Inpatient Hospital Stay (HOSPITAL_COMMUNITY): Payer: Medicare Other

## 2022-12-25 DIAGNOSIS — R7989 Other specified abnormal findings of blood chemistry: Secondary | ICD-10-CM

## 2022-12-25 DIAGNOSIS — I2489 Other forms of acute ischemic heart disease: Secondary | ICD-10-CM

## 2022-12-25 DIAGNOSIS — J9383 Other pneumothorax: Secondary | ICD-10-CM | POA: Diagnosis not present

## 2022-12-25 DIAGNOSIS — Z789 Other specified health status: Secondary | ICD-10-CM

## 2022-12-25 DIAGNOSIS — I350 Nonrheumatic aortic (valve) stenosis: Secondary | ICD-10-CM | POA: Insufficient documentation

## 2022-12-25 DIAGNOSIS — I10 Essential (primary) hypertension: Secondary | ICD-10-CM | POA: Diagnosis not present

## 2022-12-25 LAB — BASIC METABOLIC PANEL
Anion gap: 8 (ref 5–15)
BUN: 15 mg/dL (ref 8–23)
CO2: 25 mmol/L (ref 22–32)
Calcium: 8.9 mg/dL (ref 8.9–10.3)
Chloride: 100 mmol/L (ref 98–111)
Creatinine, Ser: 0.93 mg/dL (ref 0.61–1.24)
GFR, Estimated: >60 mL/min (ref >60)
Glucose, Bld: 124 mg/dL — ABNORMAL HIGH (ref 70–99)
Potassium: 4 mmol/L (ref 3.5–5.1)
Sodium: 133 mmol/L — ABNORMAL LOW (ref 135–145)

## 2022-12-25 LAB — ECHOCARDIOGRAM COMPLETE
AR max vel: 1.64 cm2
AV Area VTI: 1.93 cm2
AV Area mean vel: 1.69 cm2
AV Mean grad: 14 mm[Hg]
AV Peak grad: 27.7 mm[Hg]
Ao pk vel: 2.63 m/s
Area-P 1/2: 1.98 cm2
Est EF: >75
Height: 71 in
S' Lateral: 2.9 cm
Weight: 2848 [oz_av]

## 2022-12-25 LAB — CBC
HCT: 41.6 % (ref 39.0–52.0)
Hemoglobin: 13.8 g/dL (ref 13.0–17.0)
MCH: 30.1 pg (ref 26.0–34.0)
MCHC: 33.2 g/dL (ref 30.0–36.0)
MCV: 90.8 fL (ref 80.0–100.0)
Platelets: 237 10*3/uL (ref 150–400)
RBC: 4.58 MIL/uL (ref 4.22–5.81)
RDW: 13.9 % (ref 11.5–15.5)
WBC: 4.6 10*3/uL (ref 4.0–10.5)
nRBC: 0 % (ref 0.0–0.2)

## 2022-12-25 LAB — TROPONIN I (HIGH SENSITIVITY): Troponin I (High Sensitivity): 486 ng/L (ref <18)

## 2022-12-25 LAB — MRSA NEXT GEN BY PCR, NASAL: MRSA by PCR Next Gen: NOT DETECTED

## 2022-12-25 MED ORDER — ALUM & MAG HYDROXIDE-SIMETH 200-200-20 MG/5ML PO SUSP
15.0000 mL | Freq: Once | ORAL | Status: AC
  Administered 2022-12-25: 15 mL via ORAL
  Filled 2022-12-25: qty 30

## 2022-12-25 MED ORDER — ASPIRIN 81 MG PO CHEW
81.0000 mg | CHEWABLE_TABLET | Freq: Every day | ORAL | Status: DC
  Administered 2022-12-25: 81 mg via ORAL
  Filled 2022-12-25 (×2): qty 1

## 2022-12-25 NOTE — Evaluation (Signed)
Physical Therapy Evaluation Patient Details Name: Jeremy Boone MRN: [id number] DOB: 09/08/1934 Today's Date: 12/25/2022  History of Present Illness  Pt is 88 year old [facility]  [facility]/22 for spontaneous rt sided pneumothorax. Chest tube placed. PMH - HTN, OSA, gout  Clinical Impression  Pt admitted with above diagnosis and presents to PT with functional limitations due to deficits listed below (See PT problem list). Pt needs skilled PT to maximize independence and safety. Pt with improved stability using walker. Instructed pt to use walker on return home. Expect pt is not far from baseline. Pt declined follow up.         If plan is discharge home, recommend the following: Help with stairs or ramp for entrance   Can travel by private vehicle        Equipment Recommendations None recommended by PT  Recommendations for Other Services       Functional Status Assessment Patient has had a recent decline in their functional status and demonstrates the ability to make significant improvements in function in a reasonable and predictable amount of time.     Precautions / Restrictions Precautions Precautions: Fall;Other (comment) Precaution Comments: chest tube      Mobility  Bed Mobility Overal bed mobility: Modified Independent                  Transfers Overall transfer level: Needs assistance Equipment used: None Transfers: Sit to/from Stand Sit to Stand: Contact guard assist           General transfer comment: Assist for safety    Ambulation/Gait Ambulation/Gait assistance: Contact guard assist Gait Distance (Feet): 180 Feet Assistive device: None, Rolling walker (2 wheels) Gait Pattern/deviations: Step-through pattern, Decreased stride length, Decreased dorsiflexion - right Gait velocity: decr Gait velocity interpretation: 1.31 - 2.62 ft/sec, indicative of limited community ambulator   General Gait Details: Assist for safety. Pt with better  stability with use of rolling walker  Stairs            Wheelchair Mobility     Tilt Bed    Modified Rankin (Stroke Patients Only)       Balance Overall balance assessment: Needs assistance Sitting-balance support: No upper extremity supported, Feet supported Sitting balance-Leahy Scale: Good     Standing balance support: No upper extremity supported, During functional activity Standing balance-Leahy Scale: Fair                               Pertinent Vitals/Pain Pain Assessment Pain Assessment: No/denies pain    Home Living Family/patient expects to be discharged to:: Private residence Living Arrangements: Spouse/significant other Available Help at Discharge: Family;Available 24 hours/day Type of Home: House Home Access: Stairs to enter   [employer] of Steps: 3-4   Home Layout: Two level;Able to live on main level with bedroom/bathroom Home Equipment: Rolling Walker (2 wheels);Cane - single point;Wheelchair - manual      Prior Function Prior Level of Function : Independent/Modified Independent;Driving             Mobility Comments: Not using assistive device       Extremity/Trunk Assessment   Upper Extremity Assessment Upper Extremity Assessment: Overall WFL for tasks assessed    Lower Extremity Assessment Lower Extremity Assessment: Generalized weakness       Communication   Communication Communication: No apparent difficulties  Cognition Arousal: Alert Behavior During Therapy: WFL for tasks assessed/performed Overall Cognitive Status:  Within Functional Limits for tasks assessed                                          General Comments General comments (skin integrity, edema, etc.): VSS on RA    Exercises     Assessment/Plan    PT Assessment Patient needs continued PT services  PT Problem List Decreased strength;Decreased balance;Decreased mobility       PT Treatment Interventions DME  instruction;Gait training;Functional mobility training;Therapeutic activities;Therapeutic exercise;Stair training;Balance training;Patient/family education    PT Goals (Current goals can be found in the Care Plan section)  Acute Rehab PT Goals Patient Stated Goal: return home PT Goal Formulation: With patient Time For Goal Achievement: 01/08/23 Potential to Achieve Goals: Good    Frequency Min 1X/week     Co-evaluation               AM-PAC PT "6 Clicks" Mobility  Outcome Measure Help needed turning from your back to your side while in a flat bed without using bedrails?: None Help needed moving from lying on your back to sitting on the side of a flat bed without using bedrails?: None Help needed moving to and from a bed to a chair (including a wheelchair)?: A Little Help needed standing up from a chair using your arms (e.g., wheelchair or bedside chair)?: A Little Help needed to walk in hospital room?: A Little Help needed climbing 3-5 steps with a railing? : A Little 6 Click Score: 20    End of Session   Activity Tolerance: Patient tolerated treatment well Patient left: in chair;with call bell/phone within reach;with chair alarm set Nurse Communication: Mobility status PT Visit Diagnosis: Unsteadiness on feet (R26.81);Other abnormalities of gait and mobility (R26.89)    Time: 5573-2202 PT Time Calculation (min) (ACUTE ONLY): 15 min   Charges:   PT Evaluation $PT Eval Low Complexity: 1 Low   PT General Charges $$ ACUTE PT VISIT: 1 Visit         [facility] PT Acute Rehabilitation Services Office [phone]   Angelina Ok [facility] 12/25/2022, 1:33 PM

## 2022-12-25 NOTE — Progress Notes (Signed)
NAME:  Jeremy Boone, MRN:  [id number], DOB:  12/28/1934, LOS: 1 ADMISSION DATE:  12/24/2022, CONSULTATION DATE:  12/24/22 REFERRING MD:  EDP, CHIEF COMPLAINT:  Abd pain   History of Present Illness:  88 year old man w/ hx of HTN presenting with rather sudden onset R abdominal pain today followed by shortness of breath.  CXR showing large PTX.  Pigtail placed in ER. PCCM to manage. Nonsmoker, no hx of trauma, no prior lung hx. CT chest has just been done  Pertinent  Medical History  HTN HLD Gout  Significant Hospital Events: Including procedures, antibiotic start and stop dates in addition to other pertinent events   10/24 admit  Interim History / Subjective:  S/p chest tube placement 12/24/2022  Objective   Blood pressure 111/71, pulse 60, temperature 98 F (36.7 C), temperature source Oral, resp. rate 15, height 5\' 11"  (1.803 m), weight 80.7 kg, SpO2 97%.        Intake/Output Summary (Last 24 hours) at 12/25/2022 0826 Last data filed at 12/25/2022 0300 Gross per 24 hour  Intake --  Output 520 ml  Net -520 ml   Filed Weights   12/24/22 0722  Weight: 80.7 kg    Examination: Awake alert no distress No JVD lymphadenopathy is appreciated Decreased breath sounds in the bases right chest tube connected to 20 cm of suction.  Suction discontinued.  No airleak is noted at this time.  No drainage at this time. Abdomen soft nontender Extremities are warm without edema. Resolved Hospital Problem list   N/A  Assessment & Plan:  Primary spontaneous PTX- does have ipsilateral blebs.  No preceding cause identified.  Given age would just watch with tube for now and see if we can achieve pleurodesis on suction.  Chest tube was placed 12/24/2022 Chest x-ray 12/25/2022 reveals no pneumothorax chest tube in good position CT scan from 12/24/2023 with no acute abnormal findings Chest tube placed to waterseal 12/25/2022 at 0730 hrs. Will check back with him in 2 hours if no  distress will clamp chest tube with a plan to remove later today. Serial chest x-rays Best Practice (right click and "Reselect all SmartList Selections" daily)  Per primary  Labs   CBC: Recent Labs  Lab 12/24/22 0733 12/25/22 0641  WBC 5.1 4.6  NEUTROABS 2.1  --   HGB 14.6 13.8  HCT 44.9 41.6  MCV 92.6 90.8  PLT 238 237    Basic Metabolic Panel: Recent Labs  Lab 12/24/22 0733 12/25/22 0641  NA 135 133*  K 3.5 4.0  CL 104 100  CO2 19* 25  GLUCOSE 116* 124*  BUN 15 15  CREATININE 0.84 0.93  CALCIUM 9.0 8.9   GFR: Estimated Creatinine Clearance: 60.7 mL/min (by C-G formula based on SCr of 0.93 mg/dL). Recent Labs  Lab 12/24/22 0733 12/25/22 0641  WBC 5.1 4.6    Liver Function Tests: Recent Labs  Lab 12/24/22 0733  AST 41  ALT 18  ALKPHOS 61  BILITOT 0.7  PROT 7.7  ALBUMIN 3.7   Recent Labs  Lab 12/24/22 0733  LIPASE 32   No results for input(s): "AMMONIA" in the last 168 hours.  ABG No results found for: "PHART", "PCO2ART", "PO2ART", "HCO3", "TCO2", "ACIDBASEDEF", "O2SAT"   Coagulation Profile: No results for input(s): "INR", "PROTIME" in the last 168 hours.  Cardiac Enzymes: No results for input(s): "CKTOTAL", "CKMB", "CKMBINDEX", "TROPONINI" in the last 168 hours.  HbA1C: Hgb A1c MFr Bld  Date/Time Value Ref Range Status  11/09/2022 03:30 PM 6.4 (H) <5.7 % of total Hgb Final    Comment:    For someone without known diabetes, a hemoglobin  A1c value between 5.7% and 6.4% is consistent with prediabetes and should be confirmed with a  follow-up test. . For someone with known diabetes, a value <7% indicates that their diabetes is well controlled. A1c targets should be individualized based on duration of diabetes, age, comorbid conditions, and other considerations. . This assay result is consistent with an increased risk of diabetes. . Currently, no consensus exists regarding use of hemoglobin A1c for diagnosis of diabetes for  children. Marland Kitchen   04/21/2022 11:20 AM 6.1 (H) <5.7 % of total Hgb Final    Comment:    For someone without known diabetes, a hemoglobin  A1c value between 5.7% and 6.4% is consistent with prediabetes and should be confirmed with a  follow-up test. . For someone with known diabetes, a value <7% indicates that their diabetes is well controlled. A1c targets should be individualized based on duration of diabetes, age, comorbid conditions, and other considerations. . This assay result is consistent with an increased risk of diabetes. . Currently, no consensus exists regarding use of hemoglobin A1c for diagnosis of diabetes for children. .     CBG: No results for input(s): "GLUCAP" in the last 168 hours.       Brett Canales Katelind Pytel ACNP Acute Care Nurse Practitioner [facility]/Critical [facility] Please consult Amion 12/25/2022, 8:26 AM

## 2022-12-25 NOTE — Progress Notes (Addendum)
Echocardiogram reviewed.  Shows hyperdynamic LV function with no regional wall motion abnormality.  Mild aortic stenosis.  The patient can follow-up in our office in 4 to 6 weeks.  His symptoms are not consistent with an acute coronary syndrome.  Would continue aspirin and Zetia.  His aortic stenosis is mild and will be followed as an outpatient.  Cardiology to sign off.  Please call with questions.  Gerri Spore T. Flora Lipps, MD, [facility]  [facility]  [address] [place], [place] [address] [phone]  4:53 PM

## 2022-12-25 NOTE — Progress Notes (Addendum)
Cardiology Progress Note  Patient ID: Jeremy Boone MRN: [id number] DOB: 04-18-1934 Date of Encounter: 12/25/2022 Primary Cardiologist: None  Subjective   Chief Complaint: SOB  HPI: No more CP.   ROS:  All other ROS reviewed and negative. Pertinent positives noted in the HPI.     Vital Signs   Vitals:   12/25/22 0800 12/25/22 0815 12/25/22 0830 12/25/22 0837  BP: (!) 142/75 109/69 130/76   Pulse: 63 71 81   Resp: 16 (!) 21 15   Temp:    98.2 F (36.8 C)  TempSrc:    Oral  SpO2: 96% 97% 96%   Weight:      Height:        Intake/Output Summary (Last 24 hours) at 12/25/2022 0925 Last data filed at 12/25/2022 0300 Gross per 24 hour  Intake --  Output 520 ml  Net -520 ml      12/24/2022    7:22 AM 11/09/2022    3:29 PM 08/26/2022    9:19 AM  Last 3 Weights  Weight (lbs) 178 lb 178 lb 6.4 oz 173 lb 12.8 oz  Weight (kg) 80.74 kg 80.922 kg 78.835 kg      Telemetry  Overnight telemetry shows SR 60-70, which I personally reviewed.   ECG  The most recent ECG shows SR LVH with repol, which I personally reviewed.   Physical Exam   Vitals:   12/25/22 0800 12/25/22 0815 12/25/22 0830 12/25/22 0837  BP: (!) 142/75 109/69 130/76   Pulse: 63 71 81   Resp: 16 (!) 21 15   Temp:    98.2 F (36.8 C)  TempSrc:    Oral  SpO2: 96% 97% 96%   Weight:      Height:        Intake/Output Summary (Last 24 hours) at 12/25/2022 0925 Last data filed at 12/25/2022 0300 Gross per 24 hour  Intake --  Output 520 ml  Net -520 ml       12/24/2022    7:22 AM 11/09/2022    3:29 PM 08/26/2022    9:19 AM  Last 3 Weights  Weight (lbs) 178 lb 178 lb 6.4 oz 173 lb 12.8 oz  Weight (kg) 80.74 kg 80.922 kg 78.835 kg    Body mass index is 24.83 kg/m.  General: Well nourished, well developed, in no acute distress Head: Atraumatic, normal size  Eyes: PEERLA, EOMI  Neck: Supple, no JVD Endocrine: No thryomegaly Cardiac: Normal S1, S2; RRR; 2/6 SEM Lungs: Clear to auscultation  bilaterally, no wheezing, rhonchi or rales  Abd: Soft, nontender, no hepatomegaly  Ext: No edema, pulses 2+ Musculoskeletal: No deformities, BUE and BLE strength normal and equal Skin: Warm and dry, no rashes   Neuro: Alert and oriented to person, place, time, and situation, CNII-XII grossly intact, no focal deficits  Psych: Normal mood and affect   Cardiac Studies  Echo pending  Patient Profile  88 year old male with history of hypertension, hyperlipidemia, OSA admitted on 12/24/2022 for spontaneous right-sided pneumothorax. Cardiology consulted for elevated troponin.   Assessment & Plan   # Elevated troponin, demand ischemia -Admitted with shortness of breath and right-sided chest discomfort.  Found to have a spontaneous right-sided pneumothorax.  Status post chest tube placement.  Symptoms are improved. -Troponins were minimally elevated.  EKG shows LVH.  His symptoms are related to his pneumothorax.  I suspect this is demand. -Start aspirin 81 mg daily as you are able.  No need for heparin.  Statin allergy reported.  On Zetia. -Follow-up echo.  # Murmur -echo pending       For questions or updates, please contact [facility] Please consult [email or web address] for contact info under        Signed, Gerri Spore T. Flora Lipps, MD, [facility] [place]  [facility]  12/25/2022 9:25 AM

## 2022-12-25 NOTE — ED Notes (Signed)
Going to vascular.

## 2022-12-25 NOTE — ED Notes (Signed)
Pt moved to hospital bed.

## 2022-12-25 NOTE — ED Notes (Signed)
Minor, NP at bedside. Clamping chest tube.

## 2022-12-25 NOTE — Progress Notes (Signed)
  Echocardiogram 2D Echocardiogram has been performed.  Delcie Roch 12/25/2022, 10:46 AM

## 2022-12-25 NOTE — Progress Notes (Signed)
CXR reviewed after pigtail was clamped this morning. On my review, cxr appears stable with no ptx.   Will place order to remove chest tube.     Tessie Fass MSN, AGACNP-BC [facility] Pulmonary/Critical Care Medicine 12/25/2022, 1:59 PM

## 2022-12-25 NOTE — ED Notes (Signed)
Jeremy Boone Minor, NP to bedside. States he has taken the chest tube off suction and will monitor. Plan to clamp later today and hopefully remove it later today.

## 2022-12-25 NOTE — Progress Notes (Signed)
[facility] PROGRESS NOTE   Jeremy Boone HQI:[id number] DOB: April 30, 1934 DOA: 12/24/2022  PCP: [name], MD  Brief History: 88 y.o. male with Past medical history of HLD, HTN, gout, presented to the hospital with complaints of abdominal pain and shortness of breath.  He was found to have pneumothorax.  He was hospitalized for further management.  Was also found to have elevated troponin levels.    Consultants: Pulmonology.  Cardiology  Procedures: Echocardiogram is pending.  Chest tube placement for pneumothorax    Subjective/Interval History: Patient feels fatigued.  Could not sleep well overnight.  Some discomfort around the chest tube site.  Denies any nausea or vomiting.  No shortness of breath currently.    Assessment/Plan:  Spontaneous pneumothorax Pulmonology is following.  Chest tube was placed in the emergency department. Patient denies history of smoking although he did work in [place] and was exposed to a lot of dust.  Elevated troponin/demand ischemia Cardiology is following.  Echocardiogram is pending.  Denies any chest pain this morning. Check lipid panel. Okay to use aspirin.  Noted to be on Zetia and gemfibrozil.  Hyperlipidemia Intolerance to statins.  Noted to be on gemfibrozil and Zetia. Lipid panel in September showed a LDL of 113.  Essential hypertension Monitor blood pressure closely.  History of GERD PPI  Obstructive sleep apnea No CPAP for now due to pneumothorax.  Alcohol use Consumes 2-3 drinks on a daily basis.  CIWA protocol.  Constipation Bowel regimen.   DVT Prophylaxis: Lovenox Code Status: Full code Family Communication: Discussed with patient Disposition Plan: Hopefully return home when improved  Status is: Inpatient Remains inpatient appropriate because: Pneumothorax, elevated troponin      Medications: Scheduled:  allopurinol  300 mg Oral Daily   amLODipine  2.5 mg Oral Daily   aspirin   81 mg Oral Daily   benzonatate  100 mg Oral TID   docusate sodium  100 mg Oral BID   enoxaparin (LOVENOX) injection  40 mg Subcutaneous Q24H   ezetimibe  10 mg Oral Daily   famotidine  20 mg Oral Daily   folic acid  1 mg Oral Daily   gabapentin  300 mg Oral QHS   gemfibrozil  600 mg Oral BID AC   guaiFENesin  600 mg Oral BID   multivitamin with minerals  1 tablet Oral Daily   sodium chloride flush  10 mL Intrapleural Q8H   thiamine  100 mg Oral Daily   Or   thiamine  100 mg Intravenous Daily   Continuous: XLK:GMWNUUVOZDGUY **OR** acetaminophen, albuterol, hydrALAZINE, LORazepam **OR** LORazepam, morphine injection, ondansetron **OR** ondansetron (ZOFRAN) IV, oxyCODONE  Antibiotics: Anti-infectives (From admission, onward)    None       Objective:  Vital Signs  Vitals:   12/25/22 0815 12/25/22 0830 12/25/22 0837 12/25/22 0900  BP: 109/69 130/76  122/72  Pulse: 71 81  63  Resp: (!) 21 15  13   Temp:   98.2 F (36.8 C)   TempSrc:   Oral   SpO2: 97% 96%  97%  Weight:      Height:        Intake/Output Summary (Last 24 hours) at 12/25/2022 0958 Last data filed at 12/25/2022 0300 Gross per 24 hour  Intake --  Output 520 ml  Net -520 ml   Filed Weights   12/24/22 0722  Weight: 80.7 kg    General appearance: Awake alert.  In no distress Resp: Diminished air entry in the right base.  Few crackles.  No wheezing or rhonchi. Cardio: S1-S2 is normal regular.  Systolic murmur appreciated over the precordium GI: Abdomen is soft.  Nontender nondistended.  Bowel sounds are present normal.  No masses organomegaly Extremities: No edema.  Full range of motion of lower extremities. Neurologic: Alert and oriented x3.  No focal neurological deficits.    Lab Results:  Data Reviewed: I have personally reviewed following labs and reports of the imaging studies  CBC: Recent Labs  Lab 12/24/22 0733 12/25/22 0641  WBC 5.1 4.6  NEUTROABS 2.1  --   HGB 14.6 13.8  HCT 44.9  41.6  MCV 92.6 90.8  PLT 238 237    Basic Metabolic Panel: Recent Labs  Lab 12/24/22 0733 12/25/22 0641  NA 135 133*  K 3.5 4.0  CL 104 100  CO2 19* 25  GLUCOSE 116* 124*  BUN 15 15  CREATININE 0.84 0.93  CALCIUM 9.0 8.9    GFR: Estimated Creatinine Clearance: 60.7 mL/min (by C-G formula based on SCr of 0.93 mg/dL).  Liver Function Tests: Recent Labs  Lab 12/24/22 0733  AST 41  ALT 18  ALKPHOS 61  BILITOT 0.7  PROT 7.7  ALBUMIN 3.7    Recent Labs  Lab 12/24/22 0733  LIPASE 32     Recent Results (from the past 240 hour(s))  Resp panel by RT-PCR (RSV, Flu A&B, Covid) Anterior Nasal Swab     Status: None   Collection Time: 12/24/22  7:33 AM   Specimen: Anterior Nasal Swab  Result Value Ref Range Status   SARS Coronavirus 2 by RT PCR NEGATIVE NEGATIVE Final   Influenza A by PCR NEGATIVE NEGATIVE Final   Influenza B by PCR NEGATIVE NEGATIVE Final    Comment: (NOTE) The Xpert Xpress SARS-CoV-2/FLU/RSV plus assay is intended as an aid in the diagnosis of influenza from Nasopharyngeal swab specimens and should not be used as a sole basis for treatment. Nasal washings and aspirates are unacceptable for Xpert Xpress SARS-CoV-2/FLU/RSV testing.  Fact Sheet for Patients: BloggerCourse.com  Fact Sheet for Healthcare Providers: SeriousBroker.it  This test is not yet approved or cleared by the [place] FDA and has been authorized for detection and/or diagnosis of SARS-CoV-2 by FDA under an Emergency Use Authorization (EUA). This EUA will remain in effect (meaning this test can be used) for the duration of the COVID-19 declaration under Section 564(b)(1) of the Act, 21 U.S.C. section 360bbb-3(b)(1), unless the authorization is terminated or revoked.     Resp Syncytial Virus by PCR NEGATIVE NEGATIVE Final    Comment: (NOTE) Fact Sheet for Patients: BloggerCourse.com  Fact Sheet  for Healthcare Providers: SeriousBroker.it  This test is not yet approved or cleared by the [place] FDA and has been authorized for detection and/or diagnosis of SARS-CoV-2 by FDA under an Emergency Use Authorization (EUA). This EUA will remain in effect (meaning this test can be used) for the duration of the COVID-19 declaration under Section 564(b)(1) of the Act, 21 U.S.C. section 360bbb-3(b)(1), unless the authorization is terminated or revoked.  Performed at [facility], [address], [place], [place] [address]       Radiology Studies: DG Chest [place] 1 View  Result Date: 12/25/2022 CLINICAL DATA:  88 year old male with right chest tube for pneumothorax. EXAM: PORTABLE CHEST 1 VIEW COMPARISON:  Chest CT yesterday and earlier. FINDINGS: Portable AP upright view at 0602 hours. Pigtail right chest tube appears stable. No residual pneumothorax identified now. Stable low lung volumes with  patchy lung base opacity. Stable cardiac size and mediastinal contours. No areas of worsening ventilation. Stable visualized osseous structures. Stable visible bowel gas pattern. IMPRESSION: 1. Stable right chest tube. No residual pneumothorax identified. 2. Stable low lung volumes. Electronically Signed   By: Odessa Fleming M.D.   On: 12/25/2022 06:17   CT CHEST ABDOMEN PELVIS W CONTRAST  Result Date: 12/24/2022 CLINICAL DATA:  Shortness of breath, respiratory distress, pneumothorax EXAM: CT CHEST, ABDOMEN, AND PELVIS WITH CONTRAST TECHNIQUE: Multidetector CT imaging of the chest, abdomen and pelvis was performed following the standard protocol during bolus administration of intravenous contrast. RADIATION DOSE REDUCTION: This exam was performed according to the departmental dose-optimization program which includes automated exposure control, adjustment of the mA and/or kV according to patient size and/or use of iterative reconstruction technique. CONTRAST:  75mL OMNIPAQUE  IOHEXOL 350 MG/ML SOLN COMPARISON:  Same-day chest radiographs, CT abdomen pelvis, 09/30/2009 FINDINGS: CT CHEST FINDINGS Cardiovascular: Aortic atherosclerosis. Dense aortic valve calcifications. Normal heart size. Scattered coronary artery calcifications. No pericardial effusion. Mediastinum/Nodes: No enlarged mediastinal, hilar, or axillary lymph nodes. Thyroid gland, trachea, and esophagus demonstrate no significant findings. Lungs/Pleura: Right-sided pigtail chest tube either traverses the parenchyma of the right lower lobe or an accessory segmental fissure, clearly posterior to the major fissure (e.g. Series 5, image 99), with associated contusion (e.g. Series 5, image 74). Tip of the catheter is situated posterior to the right upper lobe within the pleural space (series 5, image 54). Minimal, less than 5% residual right pneumothorax. Left basilar scarring or atelectasis with otherwise normally aerated left lung. Musculoskeletal: No chest wall abnormality. No acute osseous findings. CT ABDOMEN PELVIS FINDINGS Hepatobiliary: No solid liver abnormality is seen. No gallstones, gallbladder wall thickening, or biliary dilatation. Pancreas: Unremarkable. No pancreatic ductal dilatation or surrounding inflammatory changes. Spleen: Normal in size without significant abnormality. Adrenals/Urinary Tract: Adrenal glands are unremarkable. Simple, benign bilateral renal cortical cysts, for which no further follow-up or characterization is required. Kidneys are otherwise normal, without renal calculi, solid lesion, or hydronephrosis. Bladder is unremarkable. Stomach/Bowel: Stomach is within normal limits. Appendix appears normal. No evidence of bowel wall thickening, distention, or inflammatory changes. Pancolonic diverticulosis. Vascular/Lymphatic: Aortic atherosclerosis. No enlarged abdominal or pelvic lymph nodes. Reproductive: Prostatomegaly. Other: No abdominal wall hernia or abnormality. No ascites. Musculoskeletal:  No acute osseous findings. Incidental note of a large benign lipoma within the right gluteus maximus musculature (series 3, image 108) IMPRESSION: 1. Right-sided pigtail chest tube either traverses the parenchyma of the right lower lobe or an accessory segmental fissure, clearly posterior to the major fissure, with associated contusion. Tip of the catheter is situated posterior to the right upper lobe within the pleural space. Minimal, less than 5% residual right pneumothorax. 2. No acute CT findings of the abdomen or pelvis. 3. Pancolonic diverticulosis without evidence of acute diverticulitis. 4. Prostatomegaly. 5. Coronary artery disease. 6. Dense aortic valve calcifications. Correlate for echocardiographic evidence of aortic valve dysfunction. Aortic Atherosclerosis (ICD10-I70.0). Electronically Signed   By: Jearld Lesch M.D.   On: 12/24/2022 10:41   DG Chest Portable 1 View  Result Date: 12/24/2022 CLINICAL DATA:  88 year old male with chest tube placement for pneumothorax. EXAM: PORTABLE CHEST 1 VIEW COMPARISON:  0733 hours today. FINDINGS: Portable AP upright view at 0849 hours. Pigtail left chest tube placed with near complete resolution of the large pneumothorax. Small residual is visible at the apex. Lower lung volumes. Patchy lung base opacity most resembles atelectasis. Tortuous thoracic aorta. Calcified aortic atherosclerosis. Negative visible  bowel gas. No acute osseous abnormality identified. Severe degeneration at both shoulders. IMPRESSION: 1. Left chest tube placed with substantially reduced pneumothorax. Small residual at the apex. 2. Lower lung volumes with basilar atelectasis. Electronically Signed   By: Odessa Fleming M.D.   On: 12/24/2022 09:17   DG Chest Port 1 View  Addendum Date: 12/24/2022   ADDENDUM REPORT: 12/24/2022 08:45 ADDENDUM: Critical Value/emergent results were called by telephone at the time of interpretation on 12/24/2022 at 8:39 a.m. to provider Pricilla Loveless , who  verbally acknowledged these results. A right-sided chest tube has been placed. Electronically Signed   By: Orvan Falconer M.D.   On: 12/24/2022 08:45   Result Date: 12/24/2022 CLINICAL DATA:  Shortness of breath. EXAM: PORTABLE CHEST 1 VIEW COMPARISON:  Chest radiograph 10/07/2005. FINDINGS: Large right pneumothorax. Linear scarring in the left lung base. Normal heart size. Stable mediastinal contours with atherosclerotic calcifications of the aortic arch. IMPRESSION: Large right pneumothorax. Attempted to contact the referring physician by phone at 8:18 a.m. on 12/24/2022. Radiology assistant personnel have been notified to put me in telephone contact with the referring physician or the referring physician's clinical representative in order to discuss these findings. Once this communication is established I will issue an addendum to this report for documentation purposes. Electronically Signed: By: Orvan Falconer M.D. On: 12/24/2022 08:20       LOS: 1 day   Osvaldo Shipper  [facility] Pager on [email or web address]  12/25/2022, 9:58 AM

## 2022-12-25 NOTE — Progress Notes (Signed)
Admission from the Ed by bed awake and alert.  Right chest tube (pigtail) clumped.  Denied any respiratory distress. Continue to monitor.

## 2022-12-25 NOTE — Significant Event (Cosign Needed)
12/25/2022 Right chest tube is clamped at 0093 hrs.  Plan is to DC chest tube in the next 3 to 4 hours with a follow-up chest x-ray.   Brett Canales Marinus Eicher ACNP Acute Care Nurse Practitioner [facility] Pulmonary/Critical Care Please consult Amion 12/25/2022, 10:05 AM

## 2022-12-25 NOTE — ED Notes (Signed)
ED TO INPATIENT HANDOFF REPORT  ED Nurse Name and Phone #: Vernona Rieger [phone]  S Name/Age/Gender Jeremy Boone 87 y.o. male Room/Bed: 004C/004C  Code Status   Code Status: Full Code  Home/SNF/Other Home Patient oriented to: self, place, time, and situation Is this baseline? Yes   Triage Complete: Triage complete  Chief Complaint Spontaneous pneumothorax [J93.83]  Triage Note Pt BIB EMS due to resp distress. Fire put pt on cpap. Pt arrives on 4L of O2. C/O stomach distention and bilateral leg swelling. No hx of CHF. Axox4. Pt in no apparent distress at this time. EMS states HTN on 220 systolic.    Allergies Allergies  Allergen Reactions   Ace Inhibitors Cough   Lipitor [Atorvastatin]     Elevates CPK and Aldolase    Level of Care/Admitting Diagnosis ED Disposition     ED Disposition  Admit   Condition  --   Comment  Hospital [facility]: [facility] [[id number]]  Level of Care: Progressive [102]  Admit to Progressive based on following criteria: RESPIRATORY PROBLEMS hypoxemic/hypercapnic respiratory failure that is responsive to NIPPV (BiPAP) or High Flow Nasal Cannula (6-80 lpm). Frequent assessment/intervention, no > Q2 hrs < Q4 hrs, to maintain oxygenation and pulmonary hygiene.  May admit patient to [facility] or [facility] if equivalent level of care is available:: No  Covid Evaluation: Asymptomatic - no recent exposure (last 10 days) testing not required  Diagnosis: Spontaneous pneumothorax [960454]  Admitting Physician: Rolly Salter [[id number]]  Attending Physician: Rolly Salter [[id number]]  Certification:: I certify there are rare and unusual circumstances requiring inpatient admission  Expected Medical Readiness: 12/27/2022          B Medical/Surgery History Past Medical History:  Diagnosis Date   Gout    Hyperlipidemia    Hypertension    Prediabetes    Past Surgical History:  Procedure Laterality Date   Cardiac Event Monitor  May-June  2017   Sinus rhythm with average heart rate 74.2 - no notable arrhythmias. Minimal PVCs. No PACs.   CARPAL TUNNEL RELEASE Left 2007   CATARACT EXTRACTION, BILATERAL     OTHER SURGICAL HISTORY  2007   Negative biopsy tonsil mass   ROTATOR CUFF REPAIR Left 2000     A IV Location/Drains/Wounds Patient Lines/Drains/Airways Status     Active Line/Drains/Airways     Name Placement date Placement time Site Days   Peripheral IV 12/24/22 18 G Anterior;Left Forearm 12/24/22  0725  Forearm  1   Chest Tube 1 Lateral;Right 12/24/22  0900  --  1            Intake/Output Last 24 hours  Intake/Output Summary (Last 24 hours) at 12/25/2022 2130 Last data filed at 12/25/2022 0300 Gross per 24 hour  Intake --  Output 520 ml  Net -520 ml    Labs/Imaging Results for orders placed or performed during the hospital encounter of 12/24/22 (from the past 48 hour(s))  Comprehensive metabolic panel     Status: Abnormal   Collection Time: 12/24/22  7:33 AM  Result Value Ref Range   Sodium 135 135 - 145 mmol/L   Potassium 3.5 3.5 - 5.1 mmol/L   Chloride 104 98 - 111 mmol/L   CO2 19 (L) 22 - 32 mmol/L   Glucose, Bld 116 (H) 70 - 99 mg/dL    Comment: Glucose reference range applies only to samples taken after fasting for at least 8 hours.   BUN 15 8 - 23 mg/dL  Creatinine, Ser 0.84 0.61 - 1.24 mg/dL   Calcium 9.0 8.9 - 41.6 mg/dL   Total Protein 7.7 6.5 - 8.1 g/dL   Albumin 3.7 3.5 - 5.0 g/dL   AST 41 15 - 41 U/L   ALT 18 0 - 44 U/L   Alkaline Phosphatase 61 38 - 126 U/L   Total Bilirubin 0.7 0.3 - 1.2 mg/dL   GFR, Estimated >60 >63 mL/min    Comment: (NOTE) Calculated using the CKD-EPI Creatinine Equation (2021)    Anion gap 12 5 - 15    Comment: Performed at [facility], [address], [place], [place] [address]  Resp panel by RT-PCR (RSV, Flu A&B, Covid) Anterior Nasal Swab     Status: None   Collection Time: 12/24/22  7:33 AM   Specimen: Anterior Nasal Swab  Result Value  Ref Range   SARS Coronavirus 2 by RT PCR NEGATIVE NEGATIVE   Influenza A by PCR NEGATIVE NEGATIVE   Influenza B by PCR NEGATIVE NEGATIVE    Comment: (NOTE) The Xpert Xpress SARS-CoV-2/FLU/RSV plus assay is intended as an aid in the diagnosis of influenza from Nasopharyngeal swab specimens and should not be used as a sole basis for treatment. Nasal washings and aspirates are unacceptable for Xpert Xpress SARS-CoV-2/FLU/RSV testing.  Fact Sheet for Patients: BloggerCourse.com  Fact Sheet for Healthcare Providers: SeriousBroker.it  This test is not yet approved or cleared by the [place] FDA and has been authorized for detection and/or diagnosis of SARS-CoV-2 by FDA under an Emergency Use Authorization (EUA). This EUA will remain in effect (meaning this test can be used) for the duration of the COVID-19 declaration under Section 564(b)(1) of the Act, 21 U.S.C. section 360bbb-3(b)(1), unless the authorization is terminated or revoked.     Resp Syncytial Virus by PCR NEGATIVE NEGATIVE    Comment: (NOTE) Fact Sheet for Patients: BloggerCourse.com  Fact Sheet for Healthcare Providers: SeriousBroker.it  This test is not yet approved or cleared by the [place] FDA and has been authorized for detection and/or diagnosis of SARS-CoV-2 by FDA under an Emergency Use Authorization (EUA). This EUA will remain in effect (meaning this test can be used) for the duration of the COVID-19 declaration under Section 564(b)(1) of the Act, 21 U.S.C. section 360bbb-3(b)(1), unless the authorization is terminated or revoked.  Performed at [facility], [address] [place], [place] [address]   Brain natriuretic peptide     Status: None   Collection Time: 12/24/22  7:33 AM  Result Value Ref Range   B Natriuretic Peptide 21.8 0.0 - 100.0 pg/mL    Comment: Performed at [facility], [address] [place], [place] [address]  CBC with Differential     Status: None   Collection Time: 12/24/22  7:33 AM  Result Value Ref Range   WBC 5.1 4.0 - 10.5 K/uL   RBC 4.85 4.22 - 5.81 MIL/uL   Hemoglobin 14.6 13.0 - 17.0 g/dL   HCT 20.2 54.2 - 70.6 %   MCV 92.6 80.0 - 100.0 fL   MCH 30.1 26.0 - 34.0 pg   MCHC 32.5 30.0 - 36.0 g/dL   RDW 23.7 62.8 - 31.5 %   Platelets 238 150 - 400 K/uL   nRBC 0.0 0.0 - 0.2 %   Neutrophils Relative % 40 %   Neutro Abs 2.1 1.7 - 7.7 K/uL   Lymphocytes Relative 41 %   Lymphs Abs 2.1 0.7 - 4.0 K/uL  Monocytes Relative 9 %   Monocytes Absolute 0.4 0.1 - 1.0 K/uL   Eosinophils Relative 9 %   Eosinophils Absolute 0.5 0.0 - 0.5 K/uL   Basophils Relative 1 %   Basophils Absolute 0.1 0.0 - 0.1 K/uL   Immature Granulocytes 0 %   Abs Immature Granulocytes 0.01 0.00 - 0.07 K/uL    Comment: Performed at [facility], [address], [place], [place] [address]  Troponin I (High Sensitivity)     Status: Abnormal   Collection Time: 12/24/22  7:33 AM  Result Value Ref Range   Troponin I (High Sensitivity) 120 (HH) <18 ng/L    Comment: CRITICAL RESULT CALLED TO, READ BACK BY AND VERIFIED WITH Manley Mason, RN [phone] 12/24/2022 SANDOVAL K (NOTE) Elevated high sensitivity troponin I (hsTnI) values and significant  changes across serial measurements may suggest ACS but many other  chronic and acute conditions are known to elevate hsTnI results.  Refer to the "Links" section for chest pain algorithms and additional  guidance. Performed at [facility], [address], [place], [place] [address]   Lipase, blood     Status: None   Collection Time: 12/24/22  7:33 AM  Result Value Ref Range   Lipase 32 11 - 51 U/L    Comment: Performed at [facility], [address] [place], [place] [address]  Urinalysis, w/ Reflex to Culture (Infection Suspected) -Urine, Clean Catch     Status: Abnormal   Collection Time: 12/24/22  7:52 AM   Result Value Ref Range   Specimen Source URINE, CLEAN CATCH    Color, Urine STRAW (A) YELLOW   APPearance CLEAR CLEAR   Specific Gravity, Urine 1.011 1.005 - 1.030   pH 6.0 5.0 - 8.0   Glucose, UA NEGATIVE NEGATIVE mg/dL   Hgb urine dipstick SMALL (A) NEGATIVE   Bilirubin Urine NEGATIVE NEGATIVE   Ketones, ur NEGATIVE NEGATIVE mg/dL   Protein, ur 865 (A) NEGATIVE mg/dL   Nitrite NEGATIVE NEGATIVE   Leukocytes,Ua NEGATIVE NEGATIVE   RBC / HPF 0-5 0 - 5 RBC/hpf   WBC, UA 0-5 0 - 5 WBC/hpf    Comment:        Reflex urine culture not performed if WBC <=10, OR if Squamous epithelial cells >5. If Squamous epithelial cells >5 suggest recollection.    Bacteria, UA NONE SEEN NONE SEEN   Squamous Epithelial / HPF 0-5 0 - 5 /HPF    Comment: Performed at [facility], [address] [place], [place] [address]  Troponin I (High Sensitivity)     Status: Abnormal   Collection Time: 12/24/22 11:00 AM  Result Value Ref Range   Troponin I (High Sensitivity) 858 (HH) <18 ng/L    Comment: CRITICAL VALUE NOTED. VALUE IS CONSISTENT WITH PREVIOUSLY REPORTED/CALLED VALUE (NOTE) Elevated high sensitivity troponin I (hsTnI) values and significant  changes across serial measurements may suggest ACS but many other  chronic and acute conditions are known to elevate hsTnI results.  Refer to the "Links" section for chest pain algorithms and additional  guidance. Performed at [facility], [address] [place], [place] [address]   Basic metabolic panel     Status: Abnormal   Collection Time: 12/25/22  6:41 AM  Result Value Ref Range   Sodium 133 (L) 135 - 145 mmol/L   Potassium 4.0 3.5 - 5.1 mmol/L   Chloride 100 98 - 111 mmol/L   CO2 25 22 -  32 mmol/L   Glucose, Bld 124 (H) 70 - 99 mg/dL    Comment: Glucose reference range applies only to samples taken after fasting for at least 8 hours.   BUN 15 8 - 23 mg/dL   Creatinine, Ser 7.82 0.61 - 1.24 mg/dL   Calcium 8.9 8.9 - 95.6 mg/dL    GFR, Estimated >21 >30 mL/min    Comment: (NOTE) Calculated using the CKD-EPI Creatinine Equation (2021)    Anion gap 8 5 - 15    Comment: Performed at [facility], [address], [place], [place] [address]  CBC     Status: None   Collection Time: 12/25/22  6:41 AM  Result Value Ref Range   WBC 4.6 4.0 - 10.5 K/uL   RBC 4.58 4.22 - 5.81 MIL/uL   Hemoglobin 13.8 13.0 - 17.0 g/dL   HCT 46.9 62.9 - 52.8 %   MCV 90.8 80.0 - 100.0 fL   MCH 30.1 26.0 - 34.0 pg   MCHC 33.2 30.0 - 36.0 g/dL   RDW 41.3 24.4 - 01.0 %   Platelets 237 150 - 400 K/uL   nRBC 0.0 0.0 - 0.2 %    Comment: Performed at [facility], [address] [place], [place] [address]  Troponin I (High Sensitivity)     Status: Abnormal   Collection Time: 12/25/22  6:41 AM  Result Value Ref Range   Troponin I (High Sensitivity) 486 (HH) <18 ng/L    Comment: CRITICAL VALUE NOTED. VALUE IS CONSISTENT WITH PREVIOUSLY REPORTED/CALLED VALUE (NOTE) Elevated high sensitivity troponin I (hsTnI) values and significant  changes across serial measurements may suggest ACS but many other  chronic and acute conditions are known to elevate hsTnI results.  Refer to the "Links" section for chest pain algorithms and additional  guidance. Performed at [facility], [address], [place], [place] [address]    DG Chest Port 1 View  Result Date: 12/25/2022 CLINICAL DATA:  88 year old male with right chest tube for pneumothorax. EXAM: PORTABLE CHEST 1 VIEW COMPARISON:  Chest CT yesterday and earlier. FINDINGS: Portable AP upright view at 0602 hours. Pigtail right chest tube appears stable. No residual pneumothorax identified now. Stable low lung volumes with patchy lung base opacity. Stable cardiac size and mediastinal contours. No areas of worsening ventilation. Stable visualized osseous structures. Stable visible bowel gas pattern. IMPRESSION: 1. Stable right chest tube. No residual pneumothorax identified. 2. Stable  low lung volumes. Electronically Signed   By: Odessa Fleming M.D.   On: 12/25/2022 06:17   CT CHEST ABDOMEN PELVIS W CONTRAST  Result Date: 12/24/2022 CLINICAL DATA:  Shortness of breath, respiratory distress, pneumothorax EXAM: CT CHEST, ABDOMEN, AND PELVIS WITH CONTRAST TECHNIQUE: Multidetector CT imaging of the chest, abdomen and pelvis was performed following the standard protocol during bolus administration of intravenous contrast. RADIATION DOSE REDUCTION: This exam was performed according to the departmental dose-optimization program which includes automated exposure control, adjustment of the mA and/or kV according to patient size and/or use of iterative reconstruction technique. CONTRAST:  75mL OMNIPAQUE IOHEXOL 350 MG/ML SOLN COMPARISON:  Same-day chest radiographs, CT abdomen pelvis, 09/30/2009 FINDINGS: CT CHEST FINDINGS Cardiovascular: Aortic atherosclerosis. Dense aortic valve calcifications. Normal heart size. Scattered coronary artery calcifications. No pericardial effusion. Mediastinum/Nodes: No enlarged mediastinal, hilar, or axillary lymph nodes. Thyroid gland, trachea, and esophagus demonstrate no significant findings. Lungs/Pleura: Right-sided pigtail chest tube either traverses the parenchyma of the right lower lobe or an accessory segmental fissure,  clearly posterior to the major fissure (e.g. Series 5, image 99), with associated contusion (e.g. Series 5, image 74). Tip of the catheter is situated posterior to the right upper lobe within the pleural space (series 5, image 54). Minimal, less than 5% residual right pneumothorax. Left basilar scarring or atelectasis with otherwise normally aerated left lung. Musculoskeletal: No chest wall abnormality. No acute osseous findings. CT ABDOMEN PELVIS FINDINGS Hepatobiliary: No solid liver abnormality is seen. No gallstones, gallbladder wall thickening, or biliary dilatation. Pancreas: Unremarkable. No pancreatic ductal dilatation or surrounding  inflammatory changes. Spleen: Normal in size without significant abnormality. Adrenals/Urinary Tract: Adrenal glands are unremarkable. Simple, benign bilateral renal cortical cysts, for which no further follow-up or characterization is required. Kidneys are otherwise normal, without renal calculi, solid lesion, or hydronephrosis. Bladder is unremarkable. Stomach/Bowel: Stomach is within normal limits. Appendix appears normal. No evidence of bowel wall thickening, distention, or inflammatory changes. Pancolonic diverticulosis. Vascular/Lymphatic: Aortic atherosclerosis. No enlarged abdominal or pelvic lymph nodes. Reproductive: Prostatomegaly. Other: No abdominal wall hernia or abnormality. No ascites. Musculoskeletal: No acute osseous findings. Incidental note of a large benign lipoma within the right gluteus maximus musculature (series 3, image 108) IMPRESSION: 1. Right-sided pigtail chest tube either traverses the parenchyma of the right lower lobe or an accessory segmental fissure, clearly posterior to the major fissure, with associated contusion. Tip of the catheter is situated posterior to the right upper lobe within the pleural space. Minimal, less than 5% residual right pneumothorax. 2. No acute CT findings of the abdomen or pelvis. 3. Pancolonic diverticulosis without evidence of acute diverticulitis. 4. Prostatomegaly. 5. Coronary artery disease. 6. Dense aortic valve calcifications. Correlate for echocardiographic evidence of aortic valve dysfunction. Aortic Atherosclerosis (ICD10-I70.0). Electronically Signed   By: Jearld Lesch M.D.   On: 12/24/2022 10:41   DG Chest Portable 1 View  Result Date: 12/24/2022 CLINICAL DATA:  88 year old male with chest tube placement for pneumothorax. EXAM: PORTABLE CHEST 1 VIEW COMPARISON:  0733 hours today. FINDINGS: Portable AP upright view at 0849 hours. Pigtail left chest tube placed with near complete resolution of the large pneumothorax. Small residual is  visible at the apex. Lower lung volumes. Patchy lung base opacity most resembles atelectasis. Tortuous thoracic aorta. Calcified aortic atherosclerosis. Negative visible bowel gas. No acute osseous abnormality identified. Severe degeneration at both shoulders. IMPRESSION: 1. Left chest tube placed with substantially reduced pneumothorax. Small residual at the apex. 2. Lower lung volumes with basilar atelectasis. Electronically Signed   By: Odessa Fleming M.D.   On: 12/24/2022 09:17   DG Chest Port 1 View  Addendum Date: 12/24/2022   ADDENDUM REPORT: 12/24/2022 08:45 ADDENDUM: Critical Value/emergent results were called by telephone at the time of interpretation on 12/24/2022 at 8:39 a.m. to provider Pricilla Loveless , who verbally acknowledged these results. A right-sided chest tube has been placed. Electronically Signed   By: Orvan Falconer M.D.   On: 12/24/2022 08:45   Result Date: 12/24/2022 CLINICAL DATA:  Shortness of breath. EXAM: PORTABLE CHEST 1 VIEW COMPARISON:  Chest radiograph 10/07/2005. FINDINGS: Large right pneumothorax. Linear scarring in the left lung base. Normal heart size. Stable mediastinal contours with atherosclerotic calcifications of the aortic arch. IMPRESSION: Large right pneumothorax. Attempted to contact the referring physician by phone at 8:18 a.m. on 12/24/2022. Radiology assistant personnel have been notified to put me in telephone contact with the referring physician or the referring physician's clinical representative in order to discuss these findings. Once this communication is established I will issue an  addendum to this report for documentation purposes. Electronically Signed: By: Orvan Falconer M.D. On: 12/24/2022 08:20    Pending Labs Unresulted Labs (From admission, onward)    None       Vitals/Pain Today's Vitals   12/25/22 0815 12/25/22 0830 12/25/22 0837 12/25/22 0900  BP: 109/69 130/76  122/72  Pulse: 71 81  63  Resp: (!) 21 15  13   Temp:   98.2 F (36.8  C)   TempSrc:   Oral   SpO2: 97% 96%  97%  Weight:      Height:      PainSc:        Isolation Precautions No active isolations  Medications Medications  sodium chloride flush (NS) 0.9 % injection 10 mL (10 mLs Intrapleural Not Given 12/25/22 0300)  allopurinol (ZYLOPRIM) tablet 300 mg (300 mg Oral Given 12/24/22 1112)  ezetimibe (ZETIA) tablet 10 mg (10 mg Oral Given 12/24/22 1111)  famotidine (PEPCID) tablet 20 mg (20 mg Oral Given 12/24/22 1116)  gabapentin (NEURONTIN) capsule 300 mg (300 mg Oral Given 12/25/22 0246)  gemfibrozil (LOPID) tablet 600 mg (600 mg Oral Given 12/25/22 0852)  enoxaparin (LOVENOX) injection 40 mg (40 mg Subcutaneous Given 12/24/22 1116)  acetaminophen (TYLENOL) tablet 650 mg (has no administration in time range)    Or  acetaminophen (TYLENOL) suppository 650 mg (has no administration in time range)  oxyCODONE (Oxy IR/ROXICODONE) immediate release tablet 5 mg (has no administration in time range)  morphine (PF) 4 MG/ML injection 4 mg (4 mg Intravenous Given 12/25/22 0437)  docusate sodium (COLACE) capsule 100 mg (100 mg Oral Not Given 12/25/22 0923)  ondansetron (ZOFRAN) tablet 4 mg ( Oral See Alternative 12/24/22 1122)    Or  ondansetron (ZOFRAN) injection 4 mg (4 mg Intravenous Given 12/24/22 1122)  albuterol (PROVENTIL) (2.5 MG/3ML) 0.083% nebulizer solution 2.5 mg (has no administration in time range)  guaiFENesin (MUCINEX) 12 hr tablet 600 mg (600 mg Oral Given 12/25/22 0246)  benzonatate (TESSALON) capsule 100 mg (100 mg Oral Patient Refused/Not Given 12/25/22 0246)  hydrALAZINE (APRESOLINE) injection 10 mg (has no administration in time range)  amLODipine (NORVASC) tablet 2.5 mg (2.5 mg Oral Given 12/24/22 1112)  LORazepam (ATIVAN) tablet 1-4 mg (1 mg Oral Given 12/25/22 0246)    Or  LORazepam (ATIVAN) injection 1-4 mg ( Intravenous See Alternative 12/25/22 0246)  thiamine (VITAMIN B1) tablet 100 mg (100 mg Oral Patient Refused/Not Given  12/24/22 1856)    Or  thiamine (VITAMIN B1) injection 100 mg ( Intravenous See Alternative 12/24/22 1856)  folic acid (FOLVITE) tablet 1 mg (1 mg Oral Patient Refused/Not Given 12/24/22 1857)  multivitamin with minerals tablet 1 tablet (1 tablet Oral Patient Refused/Not Given 12/24/22 1857)  aspirin chewable tablet 81 mg (has no administration in time range)  lidocaine-EPINEPHrine (XYLOCAINE W/EPI) 2 %-1:200000 (PF) injection 20 mL (20 mLs Infiltration Given 12/24/22 0816)  fentaNYL (SUBLIMAZE) injection 50 mcg (50 mcg Intravenous Given 12/24/22 0816)  iohexol (OMNIPAQUE) 350 MG/ML injection 75 mL (75 mLs Intravenous Contrast Given 12/24/22 0913)  alum & mag hydroxide-simeth (MAALOX/MYLANTA) 200-200-20 MG/5ML suspension 15 mL (15 mLs Oral Given 12/25/22 0407)    Mobility walks with device     Focused Assessments Pulmonary Assessment Handoff:  Lung sounds: Bilateral Breath Sounds: Clear L Breath Sounds: Clear R Breath Sounds: Clear O2 Device: Nasal Cannula O2 Flow Rate (L/min): 2 L/min    R Recommendations: See Admitting Provider Note  Report given to:   Additional Notes: Chest tube clamped.  Ambulates with an unsteady gait. Pt states this is his baseline.

## 2022-12-26 ENCOUNTER — Inpatient Hospital Stay (HOSPITAL_COMMUNITY): Payer: Medicare Other

## 2022-12-26 DIAGNOSIS — J9383 Other pneumothorax: Secondary | ICD-10-CM | POA: Diagnosis not present

## 2022-12-26 LAB — CBC
HCT: 39.2 % (ref 39.0–52.0)
Hemoglobin: 13 g/dL (ref 13.0–17.0)
MCH: 29.8 pg (ref 26.0–34.0)
MCHC: 33.2 g/dL (ref 30.0–36.0)
MCV: 89.9 fL (ref 80.0–100.0)
Platelets: 215 10*3/uL (ref 150–400)
RBC: 4.36 MIL/uL (ref 4.22–5.81)
RDW: 13.7 % (ref 11.5–15.5)
WBC: 4.9 10*3/uL (ref 4.0–10.5)
nRBC: 0 % (ref 0.0–0.2)

## 2022-12-26 LAB — LIPID PANEL
Cholesterol: 155 mg/dL (ref 0–200)
HDL: 74 mg/dL (ref >40)
LDL Cholesterol: 68 mg/dL (ref 0–99)
Total CHOL/HDL Ratio: 2.1 {ratio}
Triglycerides: 67 mg/dL (ref <150)
VLDL: 13 mg/dL (ref 0–40)

## 2022-12-26 LAB — BASIC METABOLIC PANEL
Anion gap: 8 (ref 5–15)
BUN: 19 mg/dL (ref 8–23)
CO2: 28 mmol/L (ref 22–32)
Calcium: 8.8 mg/dL — ABNORMAL LOW (ref 8.9–10.3)
Chloride: 101 mmol/L (ref 98–111)
Creatinine, Ser: 0.99 mg/dL (ref 0.61–1.24)
GFR, Estimated: >60 mL/min (ref >60)
Glucose, Bld: 114 mg/dL — ABNORMAL HIGH (ref 70–99)
Potassium: 3.9 mmol/L (ref 3.5–5.1)
Sodium: 137 mmol/L (ref 135–145)

## 2022-12-26 MED ORDER — VITAMIN B-1 100 MG PO TABS: 100.0000 mg | ORAL_TABLET | Freq: Every day | ORAL | 0 refills | Status: DC

## 2022-12-26 MED ORDER — ASPIRIN 81 MG PO TBEC: 81.0000 mg | DELAYED_RELEASE_TABLET | Freq: Every day | ORAL | 2 refills | Status: DC

## 2022-12-26 MED ORDER — AMLODIPINE BESYLATE 2.5 MG PO TABS: 2.5000 mg | ORAL_TABLET | Freq: Every day | ORAL | 1 refills | Status: DC

## 2022-12-26 NOTE — Discharge Summary (Incomplete)
Height:       71.0 in Accession #:    [id number]          Weight:       178.0 lb Date of Birth:  18-Dec-1934           BSA:          2.007 m Patient Age:    88 years            BP:           130/76 mmHg Patient Gender: M                   HR:           70 bpm. Exam Location:  Inpatient Procedure: 2D Echo, Cardiac Doppler and Color Doppler Indications:    elevated troponin  History:        Patient has no prior history of Echocardiogram examinations.                 Aortic Valve Disease, Signs/Symptoms:Shortness of Breath and                 Edema; Risk Factors:Hypertension.  Sonographer:    Delcie Roch RDCS Referring Phys: [id number] [facility] M PATEL  Sonographer Comments: Image acquisition  challenging due to respiratory motion. IMPRESSIONS  1. Left ventricular ejection fraction, by estimation, is >75%. The left ventricle has hyperdynamic function. The left ventricle has no regional wall motion abnormalities. There is mild left ventricular hypertrophy of the basal-septal segment. Left ventricular diastolic parameters are indeterminate.  2. Right ventricular systolic function is normal. The right ventricular size is normal. There is mildly elevated pulmonary artery systolic pressure. The estimated right ventricular systolic pressure is 37.3 mmHg.  3. The mitral valve is degenerative. Trivial mitral valve regurgitation. No evidence of mitral stenosis.  4. The aortic valve is tricuspid. There is moderate calcification of the aortic valve. There is moderate thickening of the aortic valve. Aortic valve regurgitation is trivial. Mild aortic valve stenosis. Aortic valve area, by VTI measures 1.93 cm. Aortic valve mean gradient measures 14.0 mmHg. Aortic valve Vmax measures 2.63 m/s. Comparison(s): No prior Echocardiogram. FINDINGS  Left Ventricle: Left ventricular ejection fraction, by estimation, is >75%. The left ventricle has hyperdynamic function. The left ventricle has no regional wall motion abnormalities. The left ventricular internal cavity size was normal in size. There is mild left ventricular hypertrophy of the basal-septal segment. Left ventricular diastolic parameters are indeterminate. Normal left ventricular filling pressure. Right Ventricle: The right ventricular size is normal. No increase in right ventricular wall thickness. Right ventricular systolic function is normal. There is mildly elevated pulmonary artery systolic pressure. The tricuspid regurgitant velocity is 2.93  m/s, and with an assumed right atrial pressure of 3 mmHg, the estimated right ventricular systolic pressure is 37.3 mmHg. Left Atrium: Left atrial size was normal in size. Right Atrium: Right atrial size was normal in  size. Pericardium: There is no evidence of pericardial effusion. Mitral Valve: The mitral valve is degenerative in appearance. There is mild thickening of the mitral valve leaflet(s). There is mild calcification of the mitral valve leaflet(s). Normal mobility of the mitral valve leaflets. Mild mitral annular calcification. Trivial mitral valve regurgitation. No evidence of mitral valve stenosis. Tricuspid Valve: The tricuspid valve is normal in structure. Tricuspid valve regurgitation is trivial. No evidence of tricuspid stenosis. Aortic Valve: The aortic valve is tricuspid. There is moderate calcification of the aortic valve. There is moderate thickening  Height:       71.0 in Accession #:    [id number]          Weight:       178.0 lb Date of Birth:  18-Dec-1934           BSA:          2.007 m Patient Age:    88 years            BP:           130/76 mmHg Patient Gender: M                   HR:           70 bpm. Exam Location:  Inpatient Procedure: 2D Echo, Cardiac Doppler and Color Doppler Indications:    elevated troponin  History:        Patient has no prior history of Echocardiogram examinations.                 Aortic Valve Disease, Signs/Symptoms:Shortness of Breath and                 Edema; Risk Factors:Hypertension.  Sonographer:    Delcie Roch RDCS Referring Phys: [id number] [facility] M PATEL  Sonographer Comments: Image acquisition  challenging due to respiratory motion. IMPRESSIONS  1. Left ventricular ejection fraction, by estimation, is >75%. The left ventricle has hyperdynamic function. The left ventricle has no regional wall motion abnormalities. There is mild left ventricular hypertrophy of the basal-septal segment. Left ventricular diastolic parameters are indeterminate.  2. Right ventricular systolic function is normal. The right ventricular size is normal. There is mildly elevated pulmonary artery systolic pressure. The estimated right ventricular systolic pressure is 37.3 mmHg.  3. The mitral valve is degenerative. Trivial mitral valve regurgitation. No evidence of mitral stenosis.  4. The aortic valve is tricuspid. There is moderate calcification of the aortic valve. There is moderate thickening of the aortic valve. Aortic valve regurgitation is trivial. Mild aortic valve stenosis. Aortic valve area, by VTI measures 1.93 cm. Aortic valve mean gradient measures 14.0 mmHg. Aortic valve Vmax measures 2.63 m/s. Comparison(s): No prior Echocardiogram. FINDINGS  Left Ventricle: Left ventricular ejection fraction, by estimation, is >75%. The left ventricle has hyperdynamic function. The left ventricle has no regional wall motion abnormalities. The left ventricular internal cavity size was normal in size. There is mild left ventricular hypertrophy of the basal-septal segment. Left ventricular diastolic parameters are indeterminate. Normal left ventricular filling pressure. Right Ventricle: The right ventricular size is normal. No increase in right ventricular wall thickness. Right ventricular systolic function is normal. There is mildly elevated pulmonary artery systolic pressure. The tricuspid regurgitant velocity is 2.93  m/s, and with an assumed right atrial pressure of 3 mmHg, the estimated right ventricular systolic pressure is 37.3 mmHg. Left Atrium: Left atrial size was normal in size. Right Atrium: Right atrial size was normal in  size. Pericardium: There is no evidence of pericardial effusion. Mitral Valve: The mitral valve is degenerative in appearance. There is mild thickening of the mitral valve leaflet(s). There is mild calcification of the mitral valve leaflet(s). Normal mobility of the mitral valve leaflets. Mild mitral annular calcification. Trivial mitral valve regurgitation. No evidence of mitral valve stenosis. Tricuspid Valve: The tricuspid valve is normal in structure. Tricuspid valve regurgitation is trivial. No evidence of tricuspid stenosis. Aortic Valve: The aortic valve is tricuspid. There is moderate calcification of the aortic valve. There is moderate thickening  [facility]  Physician Discharge Summary   Patient ID: Jeremy Boone MRN: [id number] DOB/AGE: [birth year] 88 y.o.  Admit date: 12/24/2022 Discharge date: 12/26/2022    PCP: [name], MD  DISCHARGE DIAGNOSES:    Spontaneous pneumothorax   Hyperlipidemia, mixed   Essential hypertension   Gastroesophageal reflux disease without esophagitis   OSA on CPAP   Constipation   Demand ischemia (HCC)   Alcohol use   RECOMMENDATIONS FOR OUTPATIENT FOLLOW UP: Outpatient follow-up with PCP   Home Health: None Equipment/Devices: None  CODE STATUS: Full code  DISCHARGE CONDITION: fair  Diet recommendation: As before  INITIAL HISTORY: 88 y.o. male with Past medical history of HLD, HTN, gout, presented to the hospital with complaints of abdominal pain and shortness of breath.  He was found to have pneumothorax.  He was hospitalized for further management.  Was also found to have elevated troponin levels.     Consultants: Pulmonology.  Cardiology   Procedures: Echocardiogram.  Chest tube placement for pneumothorax  HOSPITAL COURSE:   Spontaneous pneumothorax Patient underwent chest tube placement.  Seen by pulmonology.  Chest x-ray shows improvement.  Chest tube was subsequently removed.  Chest x-ray repeated this morning.  Reviewed by pulmonology.  Cleared for discharge.   Patient denies history of smoking although he did work in [place] and was exposed to a lot of dust.   Elevated troponin/demand ischemia Seen by cardiology.  Underwent echocardiogram which was low risk.  Aspirin is recommended.  Continue with Zetia and gemfibrozil.  Has intolerance to statin.     Hyperlipidemia Intolerance to statins.  Noted to be on gemfibrozil and Zetia. Lipid panel in September showed a LDL of 113.  Lipid panel repeated here showed LDL of 68.   Essential hypertension   History of GERD PPI   Obstructive sleep apnea   Alcohol use Consumes 2-3 drinks on a daily  basis.  CIWA protocol.   Constipation Bowel regimen.  Patient is stable.  Okay for discharge home today.   PERTINENT LABS:  The results of significant diagnostics from this hospitalization (including imaging, microbiology, ancillary and laboratory) are listed below for reference.    Microbiology: Recent Results (from the past 240 hour(s))  Resp panel by RT-PCR (RSV, Flu A&B, Covid) Anterior Nasal Swab     Status: None   Collection Time: 12/24/22  7:33 AM   Specimen: Anterior Nasal Swab  Result Value Ref Range Status   SARS Coronavirus 2 by RT PCR NEGATIVE NEGATIVE Final   Influenza A by PCR NEGATIVE NEGATIVE Final   Influenza B by PCR NEGATIVE NEGATIVE Final    Comment: (NOTE) The Xpert Xpress SARS-CoV-2/FLU/RSV plus assay is intended as an aid in the diagnosis of influenza from Nasopharyngeal swab specimens and should not be used as a sole basis for treatment. Nasal washings and aspirates are unacceptable for Xpert Xpress SARS-CoV-2/FLU/RSV testing.  Fact Sheet for Patients: BloggerCourse.com  Fact Sheet for Healthcare Providers: SeriousBroker.it  This test is not yet approved or cleared by the [place] FDA and has been authorized for detection and/or diagnosis of SARS-CoV-2 by FDA under an Emergency Use Authorization (EUA). This EUA will remain in effect (meaning this test can be used) for the duration of the COVID-19 declaration under Section 564(b)(1) of the Act, 21 U.S.C. section 360bbb-3(b)(1), unless the authorization is terminated or revoked.     Resp Syncytial Virus by PCR NEGATIVE NEGATIVE Final    Comment: (NOTE) Fact Sheet for Patients: BloggerCourse.com  Fact Sheet for  [facility]  Physician Discharge Summary   Patient ID: Jeremy Boone MRN: [id number] DOB/AGE: [birth year] 88 y.o.  Admit date: 12/24/2022 Discharge date: 12/26/2022    PCP: [name], MD  DISCHARGE DIAGNOSES:    Spontaneous pneumothorax   Hyperlipidemia, mixed   Essential hypertension   Gastroesophageal reflux disease without esophagitis   OSA on CPAP   Constipation   Demand ischemia (HCC)   Alcohol use   RECOMMENDATIONS FOR OUTPATIENT FOLLOW UP: Outpatient follow-up with PCP   Home Health: None Equipment/Devices: None  CODE STATUS: Full code  DISCHARGE CONDITION: fair  Diet recommendation: As before  INITIAL HISTORY: 88 y.o. male with Past medical history of HLD, HTN, gout, presented to the hospital with complaints of abdominal pain and shortness of breath.  He was found to have pneumothorax.  He was hospitalized for further management.  Was also found to have elevated troponin levels.     Consultants: Pulmonology.  Cardiology   Procedures: Echocardiogram.  Chest tube placement for pneumothorax  HOSPITAL COURSE:   Spontaneous pneumothorax Patient underwent chest tube placement.  Seen by pulmonology.  Chest x-ray shows improvement.  Chest tube was subsequently removed.  Chest x-ray repeated this morning.  Reviewed by pulmonology.  Cleared for discharge.   Patient denies history of smoking although he did work in [place] and was exposed to a lot of dust.   Elevated troponin/demand ischemia Seen by cardiology.  Underwent echocardiogram which was low risk.  Aspirin is recommended.  Continue with Zetia and gemfibrozil.  Has intolerance to statin.     Hyperlipidemia Intolerance to statins.  Noted to be on gemfibrozil and Zetia. Lipid panel in September showed a LDL of 113.  Lipid panel repeated here showed LDL of 68.   Essential hypertension   History of GERD PPI   Obstructive sleep apnea   Alcohol use Consumes 2-3 drinks on a daily  basis.  CIWA protocol.   Constipation Bowel regimen.  Patient is stable.  Okay for discharge home today.   PERTINENT LABS:  The results of significant diagnostics from this hospitalization (including imaging, microbiology, ancillary and laboratory) are listed below for reference.    Microbiology: Recent Results (from the past 240 hour(s))  Resp panel by RT-PCR (RSV, Flu A&B, Covid) Anterior Nasal Swab     Status: None   Collection Time: 12/24/22  7:33 AM   Specimen: Anterior Nasal Swab  Result Value Ref Range Status   SARS Coronavirus 2 by RT PCR NEGATIVE NEGATIVE Final   Influenza A by PCR NEGATIVE NEGATIVE Final   Influenza B by PCR NEGATIVE NEGATIVE Final    Comment: (NOTE) The Xpert Xpress SARS-CoV-2/FLU/RSV plus assay is intended as an aid in the diagnosis of influenza from Nasopharyngeal swab specimens and should not be used as a sole basis for treatment. Nasal washings and aspirates are unacceptable for Xpert Xpress SARS-CoV-2/FLU/RSV testing.  Fact Sheet for Patients: BloggerCourse.com  Fact Sheet for Healthcare Providers: SeriousBroker.it  This test is not yet approved or cleared by the [place] FDA and has been authorized for detection and/or diagnosis of SARS-CoV-2 by FDA under an Emergency Use Authorization (EUA). This EUA will remain in effect (meaning this test can be used) for the duration of the COVID-19 declaration under Section 564(b)(1) of the Act, 21 U.S.C. section 360bbb-3(b)(1), unless the authorization is terminated or revoked.     Resp Syncytial Virus by PCR NEGATIVE NEGATIVE Final    Comment: (NOTE) Fact Sheet for Patients: BloggerCourse.com  Fact Sheet for  [facility]  Physician Discharge Summary   Patient ID: Jeremy Boone MRN: [id number] DOB/AGE: [birth year] 88 y.o.  Admit date: 12/24/2022 Discharge date: 12/26/2022    PCP: [name], MD  DISCHARGE DIAGNOSES:    Spontaneous pneumothorax   Hyperlipidemia, mixed   Essential hypertension   Gastroesophageal reflux disease without esophagitis   OSA on CPAP   Constipation   Demand ischemia (HCC)   Alcohol use   RECOMMENDATIONS FOR OUTPATIENT FOLLOW UP: Outpatient follow-up with PCP   Home Health: None Equipment/Devices: None  CODE STATUS: Full code  DISCHARGE CONDITION: fair  Diet recommendation: As before  INITIAL HISTORY: 88 y.o. male with Past medical history of HLD, HTN, gout, presented to the hospital with complaints of abdominal pain and shortness of breath.  He was found to have pneumothorax.  He was hospitalized for further management.  Was also found to have elevated troponin levels.     Consultants: Pulmonology.  Cardiology   Procedures: Echocardiogram.  Chest tube placement for pneumothorax  HOSPITAL COURSE:   Spontaneous pneumothorax Patient underwent chest tube placement.  Seen by pulmonology.  Chest x-ray shows improvement.  Chest tube was subsequently removed.  Chest x-ray repeated this morning.  Reviewed by pulmonology.  Cleared for discharge.   Patient denies history of smoking although he did work in [place] and was exposed to a lot of dust.   Elevated troponin/demand ischemia Seen by cardiology.  Underwent echocardiogram which was low risk.  Aspirin is recommended.  Continue with Zetia and gemfibrozil.  Has intolerance to statin.     Hyperlipidemia Intolerance to statins.  Noted to be on gemfibrozil and Zetia. Lipid panel in September showed a LDL of 113.  Lipid panel repeated here showed LDL of 68.   Essential hypertension   History of GERD PPI   Obstructive sleep apnea   Alcohol use Consumes 2-3 drinks on a daily  basis.  CIWA protocol.   Constipation Bowel regimen.  Patient is stable.  Okay for discharge home today.   PERTINENT LABS:  The results of significant diagnostics from this hospitalization (including imaging, microbiology, ancillary and laboratory) are listed below for reference.    Microbiology: Recent Results (from the past 240 hour(s))  Resp panel by RT-PCR (RSV, Flu A&B, Covid) Anterior Nasal Swab     Status: None   Collection Time: 12/24/22  7:33 AM   Specimen: Anterior Nasal Swab  Result Value Ref Range Status   SARS Coronavirus 2 by RT PCR NEGATIVE NEGATIVE Final   Influenza A by PCR NEGATIVE NEGATIVE Final   Influenza B by PCR NEGATIVE NEGATIVE Final    Comment: (NOTE) The Xpert Xpress SARS-CoV-2/FLU/RSV plus assay is intended as an aid in the diagnosis of influenza from Nasopharyngeal swab specimens and should not be used as a sole basis for treatment. Nasal washings and aspirates are unacceptable for Xpert Xpress SARS-CoV-2/FLU/RSV testing.  Fact Sheet for Patients: BloggerCourse.com  Fact Sheet for Healthcare Providers: SeriousBroker.it  This test is not yet approved or cleared by the [place] FDA and has been authorized for detection and/or diagnosis of SARS-CoV-2 by FDA under an Emergency Use Authorization (EUA). This EUA will remain in effect (meaning this test can be used) for the duration of the COVID-19 declaration under Section 564(b)(1) of the Act, 21 U.S.C. section 360bbb-3(b)(1), unless the authorization is terminated or revoked.     Resp Syncytial Virus by PCR NEGATIVE NEGATIVE Final    Comment: (NOTE) Fact Sheet for Patients: BloggerCourse.com  Fact Sheet for  [facility]  Physician Discharge Summary   Patient ID: Jeremy Boone MRN: [id number] DOB/AGE: [birth year] 88 y.o.  Admit date: 12/24/2022 Discharge date: 12/26/2022    PCP: [name], MD  DISCHARGE DIAGNOSES:    Spontaneous pneumothorax   Hyperlipidemia, mixed   Essential hypertension   Gastroesophageal reflux disease without esophagitis   OSA on CPAP   Constipation   Demand ischemia (HCC)   Alcohol use   RECOMMENDATIONS FOR OUTPATIENT FOLLOW UP: Outpatient follow-up with PCP   Home Health: None Equipment/Devices: None  CODE STATUS: Full code  DISCHARGE CONDITION: fair  Diet recommendation: As before  INITIAL HISTORY: 88 y.o. male with Past medical history of HLD, HTN, gout, presented to the hospital with complaints of abdominal pain and shortness of breath.  He was found to have pneumothorax.  He was hospitalized for further management.  Was also found to have elevated troponin levels.     Consultants: Pulmonology.  Cardiology   Procedures: Echocardiogram.  Chest tube placement for pneumothorax  HOSPITAL COURSE:   Spontaneous pneumothorax Patient underwent chest tube placement.  Seen by pulmonology.  Chest x-ray shows improvement.  Chest tube was subsequently removed.  Chest x-ray repeated this morning.  Reviewed by pulmonology.  Cleared for discharge.   Patient denies history of smoking although he did work in [place] and was exposed to a lot of dust.   Elevated troponin/demand ischemia Seen by cardiology.  Underwent echocardiogram which was low risk.  Aspirin is recommended.  Continue with Zetia and gemfibrozil.  Has intolerance to statin.     Hyperlipidemia Intolerance to statins.  Noted to be on gemfibrozil and Zetia. Lipid panel in September showed a LDL of 113.  Lipid panel repeated here showed LDL of 68.   Essential hypertension   History of GERD PPI   Obstructive sleep apnea   Alcohol use Consumes 2-3 drinks on a daily  basis.  CIWA protocol.   Constipation Bowel regimen.  Patient is stable.  Okay for discharge home today.   PERTINENT LABS:  The results of significant diagnostics from this hospitalization (including imaging, microbiology, ancillary and laboratory) are listed below for reference.    Microbiology: Recent Results (from the past 240 hour(s))  Resp panel by RT-PCR (RSV, Flu A&B, Covid) Anterior Nasal Swab     Status: None   Collection Time: 12/24/22  7:33 AM   Specimen: Anterior Nasal Swab  Result Value Ref Range Status   SARS Coronavirus 2 by RT PCR NEGATIVE NEGATIVE Final   Influenza A by PCR NEGATIVE NEGATIVE Final   Influenza B by PCR NEGATIVE NEGATIVE Final    Comment: (NOTE) The Xpert Xpress SARS-CoV-2/FLU/RSV plus assay is intended as an aid in the diagnosis of influenza from Nasopharyngeal swab specimens and should not be used as a sole basis for treatment. Nasal washings and aspirates are unacceptable for Xpert Xpress SARS-CoV-2/FLU/RSV testing.  Fact Sheet for Patients: BloggerCourse.com  Fact Sheet for Healthcare Providers: SeriousBroker.it  This test is not yet approved or cleared by the [place] FDA and has been authorized for detection and/or diagnosis of SARS-CoV-2 by FDA under an Emergency Use Authorization (EUA). This EUA will remain in effect (meaning this test can be used) for the duration of the COVID-19 declaration under Section 564(b)(1) of the Act, 21 U.S.C. section 360bbb-3(b)(1), unless the authorization is terminated or revoked.     Resp Syncytial Virus by PCR NEGATIVE NEGATIVE Final    Comment: (NOTE) Fact Sheet for Patients: BloggerCourse.com  Fact Sheet for  Height:       71.0 in Accession #:    [id number]          Weight:       178.0 lb Date of Birth:  18-Dec-1934           BSA:          2.007 m Patient Age:    88 years            BP:           130/76 mmHg Patient Gender: M                   HR:           70 bpm. Exam Location:  Inpatient Procedure: 2D Echo, Cardiac Doppler and Color Doppler Indications:    elevated troponin  History:        Patient has no prior history of Echocardiogram examinations.                 Aortic Valve Disease, Signs/Symptoms:Shortness of Breath and                 Edema; Risk Factors:Hypertension.  Sonographer:    Delcie Roch RDCS Referring Phys: [id number] [facility] M PATEL  Sonographer Comments: Image acquisition  challenging due to respiratory motion. IMPRESSIONS  1. Left ventricular ejection fraction, by estimation, is >75%. The left ventricle has hyperdynamic function. The left ventricle has no regional wall motion abnormalities. There is mild left ventricular hypertrophy of the basal-septal segment. Left ventricular diastolic parameters are indeterminate.  2. Right ventricular systolic function is normal. The right ventricular size is normal. There is mildly elevated pulmonary artery systolic pressure. The estimated right ventricular systolic pressure is 37.3 mmHg.  3. The mitral valve is degenerative. Trivial mitral valve regurgitation. No evidence of mitral stenosis.  4. The aortic valve is tricuspid. There is moderate calcification of the aortic valve. There is moderate thickening of the aortic valve. Aortic valve regurgitation is trivial. Mild aortic valve stenosis. Aortic valve area, by VTI measures 1.93 cm. Aortic valve mean gradient measures 14.0 mmHg. Aortic valve Vmax measures 2.63 m/s. Comparison(s): No prior Echocardiogram. FINDINGS  Left Ventricle: Left ventricular ejection fraction, by estimation, is >75%. The left ventricle has hyperdynamic function. The left ventricle has no regional wall motion abnormalities. The left ventricular internal cavity size was normal in size. There is mild left ventricular hypertrophy of the basal-septal segment. Left ventricular diastolic parameters are indeterminate. Normal left ventricular filling pressure. Right Ventricle: The right ventricular size is normal. No increase in right ventricular wall thickness. Right ventricular systolic function is normal. There is mildly elevated pulmonary artery systolic pressure. The tricuspid regurgitant velocity is 2.93  m/s, and with an assumed right atrial pressure of 3 mmHg, the estimated right ventricular systolic pressure is 37.3 mmHg. Left Atrium: Left atrial size was normal in size. Right Atrium: Right atrial size was normal in  size. Pericardium: There is no evidence of pericardial effusion. Mitral Valve: The mitral valve is degenerative in appearance. There is mild thickening of the mitral valve leaflet(s). There is mild calcification of the mitral valve leaflet(s). Normal mobility of the mitral valve leaflets. Mild mitral annular calcification. Trivial mitral valve regurgitation. No evidence of mitral valve stenosis. Tricuspid Valve: The tricuspid valve is normal in structure. Tricuspid valve regurgitation is trivial. No evidence of tricuspid stenosis. Aortic Valve: The aortic valve is tricuspid. There is moderate calcification of the aortic valve. There is moderate thickening  [facility]  Physician Discharge Summary   Patient ID: Jeremy Boone MRN: [id number] DOB/AGE: [birth year] 88 y.o.  Admit date: 12/24/2022 Discharge date: 12/26/2022    PCP: [name], MD  DISCHARGE DIAGNOSES:    Spontaneous pneumothorax   Hyperlipidemia, mixed   Essential hypertension   Gastroesophageal reflux disease without esophagitis   OSA on CPAP   Constipation   Demand ischemia (HCC)   Alcohol use   RECOMMENDATIONS FOR OUTPATIENT FOLLOW UP: Outpatient follow-up with PCP   Home Health: None Equipment/Devices: None  CODE STATUS: Full code  DISCHARGE CONDITION: fair  Diet recommendation: As before  INITIAL HISTORY: 88 y.o. male with Past medical history of HLD, HTN, gout, presented to the hospital with complaints of abdominal pain and shortness of breath.  He was found to have pneumothorax.  He was hospitalized for further management.  Was also found to have elevated troponin levels.     Consultants: Pulmonology.  Cardiology   Procedures: Echocardiogram.  Chest tube placement for pneumothorax  HOSPITAL COURSE:   Spontaneous pneumothorax Patient underwent chest tube placement.  Seen by pulmonology.  Chest x-ray shows improvement.  Chest tube was subsequently removed.  Chest x-ray repeated this morning.  Reviewed by pulmonology.  Cleared for discharge.   Patient denies history of smoking although he did work in [place] and was exposed to a lot of dust.   Elevated troponin/demand ischemia Seen by cardiology.  Underwent echocardiogram which was low risk.  Aspirin is recommended.  Continue with Zetia and gemfibrozil.  Has intolerance to statin.     Hyperlipidemia Intolerance to statins.  Noted to be on gemfibrozil and Zetia. Lipid panel in September showed a LDL of 113.  Lipid panel repeated here showed LDL of 68.   Essential hypertension   History of GERD PPI   Obstructive sleep apnea   Alcohol use Consumes 2-3 drinks on a daily  basis.  CIWA protocol.   Constipation Bowel regimen.  Patient is stable.  Okay for discharge home today.   PERTINENT LABS:  The results of significant diagnostics from this hospitalization (including imaging, microbiology, ancillary and laboratory) are listed below for reference.    Microbiology: Recent Results (from the past 240 hour(s))  Resp panel by RT-PCR (RSV, Flu A&B, Covid) Anterior Nasal Swab     Status: None   Collection Time: 12/24/22  7:33 AM   Specimen: Anterior Nasal Swab  Result Value Ref Range Status   SARS Coronavirus 2 by RT PCR NEGATIVE NEGATIVE Final   Influenza A by PCR NEGATIVE NEGATIVE Final   Influenza B by PCR NEGATIVE NEGATIVE Final    Comment: (NOTE) The Xpert Xpress SARS-CoV-2/FLU/RSV plus assay is intended as an aid in the diagnosis of influenza from Nasopharyngeal swab specimens and should not be used as a sole basis for treatment. Nasal washings and aspirates are unacceptable for Xpert Xpress SARS-CoV-2/FLU/RSV testing.  Fact Sheet for Patients: BloggerCourse.com  Fact Sheet for Healthcare Providers: SeriousBroker.it  This test is not yet approved or cleared by the [place] FDA and has been authorized for detection and/or diagnosis of SARS-CoV-2 by FDA under an Emergency Use Authorization (EUA). This EUA will remain in effect (meaning this test can be used) for the duration of the COVID-19 declaration under Section 564(b)(1) of the Act, 21 U.S.C. section 360bbb-3(b)(1), unless the authorization is terminated or revoked.     Resp Syncytial Virus by PCR NEGATIVE NEGATIVE Final    Comment: (NOTE) Fact Sheet for Patients: BloggerCourse.com  Fact Sheet for

## 2022-12-26 NOTE — Progress Notes (Signed)
12/26/2022 Lung is up on CXR Okay to DC

## 2022-12-28 ENCOUNTER — Encounter: Payer: Self-pay | Admitting: Nurse Practitioner

## 2022-12-28 ENCOUNTER — Ambulatory Visit (INDEPENDENT_AMBULATORY_CARE_PROVIDER_SITE_OTHER): Payer: Medicare Other

## 2022-12-28 ENCOUNTER — Ambulatory Visit (INDEPENDENT_AMBULATORY_CARE_PROVIDER_SITE_OTHER): Payer: Medicare Other | Admitting: Nurse Practitioner

## 2022-12-28 VITALS — BP 140/80 | HR 64 | Ht 71.0 in | Wt 178.6 lb

## 2022-12-28 DIAGNOSIS — J9383 Other pneumothorax: Secondary | ICD-10-CM | POA: Diagnosis not present

## 2022-12-28 DIAGNOSIS — J939 Pneumothorax, unspecified: Secondary | ICD-10-CM | POA: Diagnosis not present

## 2022-12-28 DIAGNOSIS — R918 Other nonspecific abnormal finding of lung field: Secondary | ICD-10-CM | POA: Diagnosis not present

## 2022-12-28 NOTE — Patient Instructions (Signed)
Your chest tube site looks good. It still has just a trace amount of drainage so you can keep it covered until it's scabbed over.   Chest x ray stable with re-expanded lung  Limit lifting anything over 10 pounds and no heavy exertion/exercise for the next two weeks then you can gradually start increasing your activity level. No air travel for at least 3-4 weeks.   Follow up in 4 weeks with Dr. Judeth Horn. If symptoms worsen, please contact office for sooner follow up or seek emergency care.

## 2022-12-28 NOTE — Assessment & Plan Note (Addendum)
Spontaneous ptx. No know trauma, hx of smoking or lung disease. He did have some ipsilateral blebs on imaging but otherwise unremarkable. Resolved with pigtail chest tube placement. CXR today stable with fully expanded lung. Strict ED/return precautions reviewed. Will hold off on further workup at this time given lack of respiratory symptoms at baseline.   Patient Instructions  Your chest tube site looks good. It still has just a trace amount of drainage so you can keep it covered until it's scabbed over.   Chest x ray stable with re-expanded lung  Limit lifting anything over 10 pounds and no heavy exertion/exercise for the next two weeks then you can gradually start increasing your activity level. No air travel for at least 3-4 weeks.   Follow up in 4 weeks with Dr. Judeth Horn. If symptoms worsen, please contact office for sooner follow up or seek emergency care.

## 2022-12-28 NOTE — Progress Notes (Signed)
@Patient  ID: Jeremy Boone, male    DOB: [birth year], 88 y.o.   MRN: [id number]  Chief Complaint  Patient presents with   Follow-up    Pt is here for Hospital F/U visit. Pt has no SOB today.    Referring provider: [name], MD  HPI: 88 year old male, never smoker followed for spontaneous pneumothorax.  Past medical history significant for hypertension, OSA on CPAP, GERD, history of statin myopathy, HLD, gout.  He was recently hospitalized for spontaneous pneumothorax requiring chest tube placement.  He had gone to the emergency department with sudden onset of right abdominal pain and shortness of breath.  Clinically improved following chest tube which was placed on 10/24 and removed on 10/26.  He was discharged home with plans to follow-up in the outpatient pulmonary office.  TEST/EVENTS:  12/24/2022 CT chest: Atherosclerosis.  Right-sided chest tube in place.  Minimal less than 5% residual right pneumothorax.  Left basilar scarring or atelectasis. 12/26/2022 CXR: No pneumothorax post pigtail removal.  Mild bibasilar atelectasis, decreased from previous.  12/28/2022: Today - hospital follow up Patient presents today for hospital follow up. Doing well since he was discharged. Breathing is back to normal. No shortness of breath, cough, or chest discomfort. He does not have any history of lung disease. He's a never smoker. He does have some previous occupational exposures with dust. No evidence of ILD on CT imaging.   Allergies  Allergen Reactions   Ace Inhibitors Cough   Lipitor [Atorvastatin]     Elevates CPK and Aldolase    Immunization History  Administered Date(s) Administered   Influenza, High Dose Seasonal PF 12/18/2013, 10/28/2015, 12/10/2016, 01/26/2018, 11/10/2018, 12/24/2020, 01/14/2022   Influenza,inj,Quad PF,6+ Mos 03/07/2013   Moderna Sars-Covid-2 Vaccination 03/14/2019, 04/11/2019, 02/04/2020   PNEUMOCOCCAL CONJUGATE-20 07/01/2021   Pneumococcal Conjugate-13  03/28/2014   Pneumococcal-Unspecified 03/03/2007   Td 03/02/2004   Tdap 10/25/2014   Zoster, Live 08/26/2016    Past Medical History:  Diagnosis Date   Gout    Hyperlipidemia    Hypertension    Prediabetes     Tobacco History: Social History   Tobacco Use  Smoking Status Never  Smokeless Tobacco Never   Counseling given: Not Answered   Outpatient Medications Prior to Visit  Medication Sig Dispense Refill   allopurinol (ZYLOPRIM) 300 MG tablet Take  1 tablet  Daily for For Gout Prevention                                                                /                                                                   TAKE                                         BY  @Patient  ID: Jeremy Boone, male    DOB: [birth year], 88 y.o.   MRN: [id number]  Chief Complaint  Patient presents with   Follow-up    Pt is here for Hospital F/U visit. Pt has no SOB today.    Referring provider: [name], MD  HPI: 88 year old male, never smoker followed for spontaneous pneumothorax.  Past medical history significant for hypertension, OSA on CPAP, GERD, history of statin myopathy, HLD, gout.  He was recently hospitalized for spontaneous pneumothorax requiring chest tube placement.  He had gone to the emergency department with sudden onset of right abdominal pain and shortness of breath.  Clinically improved following chest tube which was placed on 10/24 and removed on 10/26.  He was discharged home with plans to follow-up in the outpatient pulmonary office.  TEST/EVENTS:  12/24/2022 CT chest: Atherosclerosis.  Right-sided chest tube in place.  Minimal less than 5% residual right pneumothorax.  Left basilar scarring or atelectasis. 12/26/2022 CXR: No pneumothorax post pigtail removal.  Mild bibasilar atelectasis, decreased from previous.  12/28/2022: Today - hospital follow up Patient presents today for hospital follow up. Doing well since he was discharged. Breathing is back to normal. No shortness of breath, cough, or chest discomfort. He does not have any history of lung disease. He's a never smoker. He does have some previous occupational exposures with dust. No evidence of ILD on CT imaging.   Allergies  Allergen Reactions   Ace Inhibitors Cough   Lipitor [Atorvastatin]     Elevates CPK and Aldolase    Immunization History  Administered Date(s) Administered   Influenza, High Dose Seasonal PF 12/18/2013, 10/28/2015, 12/10/2016, 01/26/2018, 11/10/2018, 12/24/2020, 01/14/2022   Influenza,inj,Quad PF,6+ Mos 03/07/2013   Moderna Sars-Covid-2 Vaccination 03/14/2019, 04/11/2019, 02/04/2020   PNEUMOCOCCAL CONJUGATE-20 07/01/2021   Pneumococcal Conjugate-13  03/28/2014   Pneumococcal-Unspecified 03/03/2007   Td 03/02/2004   Tdap 10/25/2014   Zoster, Live 08/26/2016    Past Medical History:  Diagnosis Date   Gout    Hyperlipidemia    Hypertension    Prediabetes     Tobacco History: Social History   Tobacco Use  Smoking Status Never  Smokeless Tobacco Never   Counseling given: Not Answered   Outpatient Medications Prior to Visit  Medication Sig Dispense Refill   allopurinol (ZYLOPRIM) 300 MG tablet Take  1 tablet  Daily for For Gout Prevention                                                                /                                                                   TAKE                                         BY  Imaging:  DG Chest 2 View  Result Date: 12/28/2022 CLINICAL DATA:  Pneumothorax follow-up. EXAM: CHEST - 2 VIEW COMPARISON:  Chest radiograph 12/26/2022. FINDINGS: Linear atelectasis or scarring in the lung  bases. No consolidation or pulmonary edema. Stable cardiac and mediastinal contours. No pleural effusion or pneumothorax. IMPRESSION: No pneumothorax. Linear atelectasis or scarring in the lung bases. Electronically Signed   By: Orvan Falconer M.D.   On: 12/28/2022 13:03   DG CHEST PORT 1 VIEW  Result Date: 12/26/2022 CLINICAL DATA:  Pneumothorax, respiratory distress. EXAM: PORTABLE CHEST 1 VIEW COMPARISON:  Chest radiograph dated 12/25/2022. FINDINGS: A right-sided pleural pigtail catheter has been removed. The heart size and mediastinal contours are within normal limits. Mild bibasilar atelectasis has decreased from prior exam. No significant pleural effusion or pneumothorax on either side. The visualized skeletal structures are unremarkable. IMPRESSION: 1. Interval removal of right-sided pleural pigtail catheter. No pneumothorax. 2. Mild bibasilar atelectasis has decreased from prior exam. Electronically Signed   By: Romona Curls M.D.   On: 12/26/2022 11:39   DG Chest 1 View  Result Date: 12/25/2022 CLINICAL DATA:  Chest tube in place EXAM: CHEST  1 VIEW COMPARISON:  Same day chest radiograph FINDINGS: The right chest tube is stable in position The cardiomediastinal silhouette is stable. Linear opacities in both lung bases are unchanged, again likely reflecting atelectasis. There is no pleural effusion. There is no appreciable pneumothorax There is no acute osseous abnormality. IMPRESSION: Stable right chest tube with no appreciable pneumothorax. Electronically Signed   By: Lesia Hausen M.D.   On: 12/25/2022 15:55   ECHOCARDIOGRAM COMPLETE  Result Date: 12/25/2022    ECHOCARDIOGRAM REPORT   Patient Name:   MR. Jeremy Boone Date of Exam: 12/25/2022 Medical Rec #:  [id number]           Height:       71.0 in Accession #:    [id number]          Weight:       178.0 lb Date of Birth:  1934-08-14           BSA:          2.007 m Patient Age:    88 years            BP:           130/76 mmHg Patient  Gender: M                   HR:           70 bpm. Exam Location:  Inpatient Procedure: 2D Echo, Cardiac Doppler and Color Doppler Indications:    elevated troponin  History:        Patient has no prior history of Echocardiogram examinations.                 Aortic Valve Disease, Signs/Symptoms:Shortness of Breath and                 Edema; Risk Factors:Hypertension.  Sonographer:    Delcie Roch RDCS Referring Phys: [id number] [facility] M PATEL  Sonographer Comments: Image acquisition challenging due to respiratory motion. IMPRESSIONS  1. Left ventricular ejection fraction, by estimation, is >75%. The left ventricle has hyperdynamic function. The left ventricle has no regional wall motion abnormalities. There is mild left ventricular hypertrophy of the basal-septal segment. Left ventricular diastolic parameters are indeterminate.  2. Right ventricular systolic function is normal. The right ventricular size is normal. There is mildly elevated  @Patient  ID: Jeremy Boone, male    DOB: [birth year], 88 y.o.   MRN: [id number]  Chief Complaint  Patient presents with   Follow-up    Pt is here for Hospital F/U visit. Pt has no SOB today.    Referring provider: [name], MD  HPI: 88 year old male, never smoker followed for spontaneous pneumothorax.  Past medical history significant for hypertension, OSA on CPAP, GERD, history of statin myopathy, HLD, gout.  He was recently hospitalized for spontaneous pneumothorax requiring chest tube placement.  He had gone to the emergency department with sudden onset of right abdominal pain and shortness of breath.  Clinically improved following chest tube which was placed on 10/24 and removed on 10/26.  He was discharged home with plans to follow-up in the outpatient pulmonary office.  TEST/EVENTS:  12/24/2022 CT chest: Atherosclerosis.  Right-sided chest tube in place.  Minimal less than 5% residual right pneumothorax.  Left basilar scarring or atelectasis. 12/26/2022 CXR: No pneumothorax post pigtail removal.  Mild bibasilar atelectasis, decreased from previous.  12/28/2022: Today - hospital follow up Patient presents today for hospital follow up. Doing well since he was discharged. Breathing is back to normal. No shortness of breath, cough, or chest discomfort. He does not have any history of lung disease. He's a never smoker. He does have some previous occupational exposures with dust. No evidence of ILD on CT imaging.   Allergies  Allergen Reactions   Ace Inhibitors Cough   Lipitor [Atorvastatin]     Elevates CPK and Aldolase    Immunization History  Administered Date(s) Administered   Influenza, High Dose Seasonal PF 12/18/2013, 10/28/2015, 12/10/2016, 01/26/2018, 11/10/2018, 12/24/2020, 01/14/2022   Influenza,inj,Quad PF,6+ Mos 03/07/2013   Moderna Sars-Covid-2 Vaccination 03/14/2019, 04/11/2019, 02/04/2020   PNEUMOCOCCAL CONJUGATE-20 07/01/2021   Pneumococcal Conjugate-13  03/28/2014   Pneumococcal-Unspecified 03/03/2007   Td 03/02/2004   Tdap 10/25/2014   Zoster, Live 08/26/2016    Past Medical History:  Diagnosis Date   Gout    Hyperlipidemia    Hypertension    Prediabetes     Tobacco History: Social History   Tobacco Use  Smoking Status Never  Smokeless Tobacco Never   Counseling given: Not Answered   Outpatient Medications Prior to Visit  Medication Sig Dispense Refill   allopurinol (ZYLOPRIM) 300 MG tablet Take  1 tablet  Daily for For Gout Prevention                                                                /                                                                   TAKE                                         BY  @Patient  ID: Jeremy Boone, male    DOB: [birth year], 88 y.o.   MRN: [id number]  Chief Complaint  Patient presents with   Follow-up    Pt is here for Hospital F/U visit. Pt has no SOB today.    Referring provider: [name], MD  HPI: 88 year old male, never smoker followed for spontaneous pneumothorax.  Past medical history significant for hypertension, OSA on CPAP, GERD, history of statin myopathy, HLD, gout.  He was recently hospitalized for spontaneous pneumothorax requiring chest tube placement.  He had gone to the emergency department with sudden onset of right abdominal pain and shortness of breath.  Clinically improved following chest tube which was placed on 10/24 and removed on 10/26.  He was discharged home with plans to follow-up in the outpatient pulmonary office.  TEST/EVENTS:  12/24/2022 CT chest: Atherosclerosis.  Right-sided chest tube in place.  Minimal less than 5% residual right pneumothorax.  Left basilar scarring or atelectasis. 12/26/2022 CXR: No pneumothorax post pigtail removal.  Mild bibasilar atelectasis, decreased from previous.  12/28/2022: Today - hospital follow up Patient presents today for hospital follow up. Doing well since he was discharged. Breathing is back to normal. No shortness of breath, cough, or chest discomfort. He does not have any history of lung disease. He's a never smoker. He does have some previous occupational exposures with dust. No evidence of ILD on CT imaging.   Allergies  Allergen Reactions   Ace Inhibitors Cough   Lipitor [Atorvastatin]     Elevates CPK and Aldolase    Immunization History  Administered Date(s) Administered   Influenza, High Dose Seasonal PF 12/18/2013, 10/28/2015, 12/10/2016, 01/26/2018, 11/10/2018, 12/24/2020, 01/14/2022   Influenza,inj,Quad PF,6+ Mos 03/07/2013   Moderna Sars-Covid-2 Vaccination 03/14/2019, 04/11/2019, 02/04/2020   PNEUMOCOCCAL CONJUGATE-20 07/01/2021   Pneumococcal Conjugate-13  03/28/2014   Pneumococcal-Unspecified 03/03/2007   Td 03/02/2004   Tdap 10/25/2014   Zoster, Live 08/26/2016    Past Medical History:  Diagnosis Date   Gout    Hyperlipidemia    Hypertension    Prediabetes     Tobacco History: Social History   Tobacco Use  Smoking Status Never  Smokeless Tobacco Never   Counseling given: Not Answered   Outpatient Medications Prior to Visit  Medication Sig Dispense Refill   allopurinol (ZYLOPRIM) 300 MG tablet Take  1 tablet  Daily for For Gout Prevention                                                                /                                                                   TAKE                                         BY  @Patient  ID: Jeremy Boone, male    DOB: [birth year], 88 y.o.   MRN: [id number]  Chief Complaint  Patient presents with   Follow-up    Pt is here for Hospital F/U visit. Pt has no SOB today.    Referring provider: [name], MD  HPI: 88 year old male, never smoker followed for spontaneous pneumothorax.  Past medical history significant for hypertension, OSA on CPAP, GERD, history of statin myopathy, HLD, gout.  He was recently hospitalized for spontaneous pneumothorax requiring chest tube placement.  He had gone to the emergency department with sudden onset of right abdominal pain and shortness of breath.  Clinically improved following chest tube which was placed on 10/24 and removed on 10/26.  He was discharged home with plans to follow-up in the outpatient pulmonary office.  TEST/EVENTS:  12/24/2022 CT chest: Atherosclerosis.  Right-sided chest tube in place.  Minimal less than 5% residual right pneumothorax.  Left basilar scarring or atelectasis. 12/26/2022 CXR: No pneumothorax post pigtail removal.  Mild bibasilar atelectasis, decreased from previous.  12/28/2022: Today - hospital follow up Patient presents today for hospital follow up. Doing well since he was discharged. Breathing is back to normal. No shortness of breath, cough, or chest discomfort. He does not have any history of lung disease. He's a never smoker. He does have some previous occupational exposures with dust. No evidence of ILD on CT imaging.   Allergies  Allergen Reactions   Ace Inhibitors Cough   Lipitor [Atorvastatin]     Elevates CPK and Aldolase    Immunization History  Administered Date(s) Administered   Influenza, High Dose Seasonal PF 12/18/2013, 10/28/2015, 12/10/2016, 01/26/2018, 11/10/2018, 12/24/2020, 01/14/2022   Influenza,inj,Quad PF,6+ Mos 03/07/2013   Moderna Sars-Covid-2 Vaccination 03/14/2019, 04/11/2019, 02/04/2020   PNEUMOCOCCAL CONJUGATE-20 07/01/2021   Pneumococcal Conjugate-13  03/28/2014   Pneumococcal-Unspecified 03/03/2007   Td 03/02/2004   Tdap 10/25/2014   Zoster, Live 08/26/2016    Past Medical History:  Diagnosis Date   Gout    Hyperlipidemia    Hypertension    Prediabetes     Tobacco History: Social History   Tobacco Use  Smoking Status Never  Smokeless Tobacco Never   Counseling given: Not Answered   Outpatient Medications Prior to Visit  Medication Sig Dispense Refill   allopurinol (ZYLOPRIM) 300 MG tablet Take  1 tablet  Daily for For Gout Prevention                                                                /                                                                   TAKE                                         BY  Imaging:  DG Chest 2 View  Result Date: 12/28/2022 CLINICAL DATA:  Pneumothorax follow-up. EXAM: CHEST - 2 VIEW COMPARISON:  Chest radiograph 12/26/2022. FINDINGS: Linear atelectasis or scarring in the lung  bases. No consolidation or pulmonary edema. Stable cardiac and mediastinal contours. No pleural effusion or pneumothorax. IMPRESSION: No pneumothorax. Linear atelectasis or scarring in the lung bases. Electronically Signed   By: Orvan Falconer M.D.   On: 12/28/2022 13:03   DG CHEST PORT 1 VIEW  Result Date: 12/26/2022 CLINICAL DATA:  Pneumothorax, respiratory distress. EXAM: PORTABLE CHEST 1 VIEW COMPARISON:  Chest radiograph dated 12/25/2022. FINDINGS: A right-sided pleural pigtail catheter has been removed. The heart size and mediastinal contours are within normal limits. Mild bibasilar atelectasis has decreased from prior exam. No significant pleural effusion or pneumothorax on either side. The visualized skeletal structures are unremarkable. IMPRESSION: 1. Interval removal of right-sided pleural pigtail catheter. No pneumothorax. 2. Mild bibasilar atelectasis has decreased from prior exam. Electronically Signed   By: Romona Curls M.D.   On: 12/26/2022 11:39   DG Chest 1 View  Result Date: 12/25/2022 CLINICAL DATA:  Chest tube in place EXAM: CHEST  1 VIEW COMPARISON:  Same day chest radiograph FINDINGS: The right chest tube is stable in position The cardiomediastinal silhouette is stable. Linear opacities in both lung bases are unchanged, again likely reflecting atelectasis. There is no pleural effusion. There is no appreciable pneumothorax There is no acute osseous abnormality. IMPRESSION: Stable right chest tube with no appreciable pneumothorax. Electronically Signed   By: Lesia Hausen M.D.   On: 12/25/2022 15:55   ECHOCARDIOGRAM COMPLETE  Result Date: 12/25/2022    ECHOCARDIOGRAM REPORT   Patient Name:   MR. Jeremy Boone Date of Exam: 12/25/2022 Medical Rec #:  [id number]           Height:       71.0 in Accession #:    [id number]          Weight:       178.0 lb Date of Birth:  1934-08-14           BSA:          2.007 m Patient Age:    88 years            BP:           130/76 mmHg Patient  Gender: M                   HR:           70 bpm. Exam Location:  Inpatient Procedure: 2D Echo, Cardiac Doppler and Color Doppler Indications:    elevated troponin  History:        Patient has no prior history of Echocardiogram examinations.                 Aortic Valve Disease, Signs/Symptoms:Shortness of Breath and                 Edema; Risk Factors:Hypertension.  Sonographer:    Delcie Roch RDCS Referring Phys: [id number] [facility] M PATEL  Sonographer Comments: Image acquisition challenging due to respiratory motion. IMPRESSIONS  1. Left ventricular ejection fraction, by estimation, is >75%. The left ventricle has hyperdynamic function. The left ventricle has no regional wall motion abnormalities. There is mild left ventricular hypertrophy of the basal-septal segment. Left ventricular diastolic parameters are indeterminate.  2. Right ventricular systolic function is normal. The right ventricular size is normal. There is mildly elevated  @Patient  ID: Jeremy Boone, male    DOB: [birth year], 88 y.o.   MRN: [id number]  Chief Complaint  Patient presents with   Follow-up    Pt is here for Hospital F/U visit. Pt has no SOB today.    Referring provider: [name], MD  HPI: 88 year old male, never smoker followed for spontaneous pneumothorax.  Past medical history significant for hypertension, OSA on CPAP, GERD, history of statin myopathy, HLD, gout.  He was recently hospitalized for spontaneous pneumothorax requiring chest tube placement.  He had gone to the emergency department with sudden onset of right abdominal pain and shortness of breath.  Clinically improved following chest tube which was placed on 10/24 and removed on 10/26.  He was discharged home with plans to follow-up in the outpatient pulmonary office.  TEST/EVENTS:  12/24/2022 CT chest: Atherosclerosis.  Right-sided chest tube in place.  Minimal less than 5% residual right pneumothorax.  Left basilar scarring or atelectasis. 12/26/2022 CXR: No pneumothorax post pigtail removal.  Mild bibasilar atelectasis, decreased from previous.  12/28/2022: Today - hospital follow up Patient presents today for hospital follow up. Doing well since he was discharged. Breathing is back to normal. No shortness of breath, cough, or chest discomfort. He does not have any history of lung disease. He's a never smoker. He does have some previous occupational exposures with dust. No evidence of ILD on CT imaging.   Allergies  Allergen Reactions   Ace Inhibitors Cough   Lipitor [Atorvastatin]     Elevates CPK and Aldolase    Immunization History  Administered Date(s) Administered   Influenza, High Dose Seasonal PF 12/18/2013, 10/28/2015, 12/10/2016, 01/26/2018, 11/10/2018, 12/24/2020, 01/14/2022   Influenza,inj,Quad PF,6+ Mos 03/07/2013   Moderna Sars-Covid-2 Vaccination 03/14/2019, 04/11/2019, 02/04/2020   PNEUMOCOCCAL CONJUGATE-20 07/01/2021   Pneumococcal Conjugate-13  03/28/2014   Pneumococcal-Unspecified 03/03/2007   Td 03/02/2004   Tdap 10/25/2014   Zoster, Live 08/26/2016    Past Medical History:  Diagnosis Date   Gout    Hyperlipidemia    Hypertension    Prediabetes     Tobacco History: Social History   Tobacco Use  Smoking Status Never  Smokeless Tobacco Never   Counseling given: Not Answered   Outpatient Medications Prior to Visit  Medication Sig Dispense Refill   allopurinol (ZYLOPRIM) 300 MG tablet Take  1 tablet  Daily for For Gout Prevention                                                                /                                                                   TAKE                                         BY

## 2022-12-31 ENCOUNTER — Ambulatory Visit (INDEPENDENT_AMBULATORY_CARE_PROVIDER_SITE_OTHER): Payer: Medicare Other | Admitting: Nurse Practitioner

## 2022-12-31 VITALS — BP 118/68 | HR 76 | Temp 98.1°F | Ht 71.0 in | Wt 174.4 lb

## 2022-12-31 DIAGNOSIS — Z79899 Other long term (current) drug therapy: Secondary | ICD-10-CM | POA: Diagnosis not present

## 2022-12-31 DIAGNOSIS — J9383 Other pneumothorax: Secondary | ICD-10-CM | POA: Diagnosis not present

## 2022-12-31 DIAGNOSIS — Z09 Encounter for follow-up examination after completed treatment for conditions other than malignant neoplasm: Secondary | ICD-10-CM

## 2022-12-31 DIAGNOSIS — I10 Essential (primary) hypertension: Secondary | ICD-10-CM | POA: Diagnosis not present

## 2023-01-06 ENCOUNTER — Encounter: Payer: Self-pay | Admitting: Nurse Practitioner

## 2023-01-06 NOTE — Patient Instructions (Signed)
Pneumothorax A pneumothorax is commonly called a collapsed lung. It is a condition in which air leaks from a lung and builds up between the thin layer of tissue that covers the lungs (visceral pleura)and the interior wall of the chest cavity (parietal pleura). The air gets trapped outside the lung, between the lung and the chest wall (pleural space). The air takes up space and prevents the lung from fully expanding. This condition sometimes occurs suddenly with no apparent cause. The buildup of air may be small or large. A small pneumothorax may go away on its own. A large pneumothorax will require treatment and hospitalization. What are the causes? This condition may be caused by: Trauma and injury to the chest wall. Surgery and other medical procedures. A complication of an underlying lung problem, especially chronic obstructive pulmonary disease (COPD) or emphysema. Sometimes the cause of this condition is not known. What increases the risk? You are more likely to develop this condition if: You have an underlying lung problem. You smoke. You are 56-45 years old, male, tall, and underweight. You have a personal or family history of pneumothorax. You have an eating disorder (anorexia nervosa). This condition can also happen quickly, even in people with no history of lung problems. What are the signs or symptoms? Sometimes a pneumothorax will have no symptoms. When symptoms are present, they may include: Chest pain. Shortness of breath. Increased rate of breathing. Bluish color to the skin, lips, or fingernails (cyanosis). How is this diagnosed? This condition may be diagnosed by: A medical history and physical exam. A chest X-ray, chest CT scan, or ultrasound. How is this treated? Treatment depends on how severe your condition is. The goal of treatment is to remove the extra air and allow your lung to expand back to its normal size. For a small pneumothorax: No treatment may be  needed. Extra oxygen is sometimes used to make it go away more quickly. For a large pneumothorax or one that is causing symptoms, a procedure is done to release the air from around your lungs. To do this, a health care provider may use: A needle with a syringe. This is used to suck air from a pleural space where no additional leakage is taking place. A chest tube. This is used to suck air where there is ongoing leakage into the pleural space. The chest tube may need to remain in place for several days until the air leak has healed. In more severe cases, surgery may be needed to repair the damage that is causing the leak. If you have multiple pneumothorax episodes or have an air leak that will not heal, a procedure called a pleurodesis may be done. A medicine is placed in the pleural space to irritate the tissues around the lung so that the lung will stick to the chest wall, seal any leaks, and stop any buildup of air in that space. If you have an underlying lung problem, severe symptoms, or a large pneumothorax you will usually need to stay in the hospital. Follow these instructions at home: Lifestyle Do not use any products that contain nicotine or tobacco. These products include cigarettes, chewing tobacco, and vaping devices, such as e-cigarettes. If you need help quitting, ask your health care provider. Do not lift anything that is heavier than 10 lb (4.5 kg), or the limit that you are told, until your health care provider says that it is safe. Avoid activities that take a lot of effort (are strenuous) for as long as  told by your health care provider. Return to your normal activities as told by your health care provider. Ask your health care provider what activities are safe for you. Do not fly in an airplane or scuba dive until your health care provider says it is okay. General instructions Take over-the-counter and prescription medicines only as told by your health care provider. If a cough or  pain makes it difficult for you to sleep at night, try sleeping in a semi-upright position in a recliner or by using 2 or 3 pillows. If you had a chest tube and it was removed, ask your health care provider when you can remove the bandage (dressing). While the dressing is in place, do not allow it to get wet. Keep all follow-up visits. This is important. Contact a health care provider if: You cough up thick mucus (sputum) that is yellow or green. You were treated with a chest tube, and you have redness, increasing pain, or discharge at the site where it was placed. Get help right away if: You have increasing chest pain or shortness of breath. You have a cough that will not go away. You begin coughing up blood. You have pain that is getting worse or is not controlled with medicines. The site where your chest tube was located opens up. You feel air coming out of the site where the chest tube was placed. You have a fever or symptoms that last for more than 2-3 days. Your skin, lips, or fingernails turn blue. These symptoms may represent a serious problem that is an emergency. Do not wait to see if the symptoms will go away. Get medical help right away. Call your local emergency services (911 in the [place] Do not drive yourself to the hospital. Summary A pneumothorax, commonly called a collapsed lung, is a condition in which air leaks from a lung and gets trapped between the lung and the chest wall (pleural space). The buildup of air may be small or large. A small pneumothorax may go away on its own. A large pneumothorax will require treatment and hospitalization. Treatment for this condition depends on how severe the pneumothorax is. The goal of treatment is to remove the extra air and allow the lung to expand back to its normal size. Get help right away if you have increasing chest pain or shortness of breath. This information is not intended to replace advice given to you by your health care  provider. Make sure you discuss any questions you have with your health care provider. Document Revised: 07/25/2020 Document Reviewed: 07/25/2020 Elsevier Patient Education  2024 [employer].

## 2023-01-10 ENCOUNTER — Other Ambulatory Visit: Payer: Self-pay | Admitting: Internal Medicine

## 2023-01-10 DIAGNOSIS — K219 Gastro-esophageal reflux disease without esophagitis: Secondary | ICD-10-CM

## 2023-01-18 ENCOUNTER — Other Ambulatory Visit: Payer: Self-pay | Admitting: Nurse Practitioner

## 2023-01-18 DIAGNOSIS — G4700 Insomnia, unspecified: Secondary | ICD-10-CM

## 2023-01-25 NOTE — Progress Notes (Unsigned)
Cardiology Clinic Note   Patient Name: Jeremy Boone Date of Encounter: 01/25/2023  Primary Care Provider:  [name], MD Primary Cardiologist:  None  Patient Profile    Jeremy Boone 88 year old male presents to the clinic today for follow-up evaluation of his essential hypertension and hyperlipidemia.  Past Medical History    Past Medical History:  Diagnosis Date   Gout    Hyperlipidemia    Hypertension    Prediabetes    Past Surgical History:  Procedure Laterality Date   Cardiac Event Monitor  May-June 2017   Sinus rhythm with average heart rate 74.2 - no notable arrhythmias. Minimal PVCs. No PACs.   CARPAL TUNNEL RELEASE Left 2007   CATARACT EXTRACTION, BILATERAL     OTHER SURGICAL HISTORY  2007   Negative biopsy tonsil mass   ROTATOR CUFF REPAIR Left 2000    Allergies  Allergies  Allergen Reactions   Ace Inhibitors Cough   Lipitor [Atorvastatin]     Elevates CPK and Aldolase    History of Present Illness    Jeremy Boone has a PMH of hyperlipidemia, HTN, prediabetes, gout, and was recently admitted to the hospital on 12/24/2022 and discharged on 12/26/2022.  His PMH also includes OSA.  He was diagnosed with spontaneous right sided pneumothorax.  Cardiology was consulted for evaluation of elevated troponin.  It was felt that his troponin values were increased in the setting of demand ischemia.  He received chest tube placement and his symptoms resolved.  His cardiac troponins were minimally elevated.  His EKG showed LVH.  He was started on aspirin and follow-up echocardiogram was planned.  He was also noted to have a cardiac murmur.  His echocardiogram 12/25/2022 showed EF greater than 75%, mild LVH, intermediate diastolic parameters, mildly elevated pulmonary artery systolic pressure, mitral valve degeneration, trivial mitral valve regurgitation, moderate aortic valve calcification, and moderate thickening of the aortic valve with trivial aortic  valve regurgitation and mild aortic valve stenosis.  He presents to the clinic today for follow-up evaluation and states***.  *** denies chest pain, shortness of breath, lower extremity edema, fatigue, palpitations, melena, hematuria, hemoptysis, diaphoresis, weakness, presyncope, syncope, orthopnea, and PND.  Elevated cardiac troponins-denies chest pain.  Has returned to normal daily activities.  Echocardiogram reassuring. Cardiac troponins elevated in the setting of demand ischemia. No plans for ischemic evaluation Maintain physical activity Continue aspirin Heart healthy low-sodium diet  Cardiac murmur-3/6 systolic murmur heard at right sternal border.  Echocardiogram showed trivial mitral valve regurgitation and moderate thickening of the aortic valve with mild aortic valve stenosis. Plan for repeat echocardiogram 10/25  Disposition: Follow-up with Dr. Flora Lipps or me in 6-9 months.  Home Medications    Prior to Admission medications   Medication Sig Start Date End Date Taking? Authorizing Provider  allopurinol (ZYLOPRIM) 300 MG tablet Take  1 tablet  Daily for For Gout Prevention                                                                /  TAKE                                         BY                                                 MOUTH 10/24/22   [name], MD  amLODipine (NORVASC) 2.5 MG tablet Take 1 tablet (2.5 mg total) by mouth daily. 12/27/22   Osvaldo Shipper, MD  aspirin EC 81 MG tablet Take 1 tablet (81 mg total) by mouth daily. Swallow whole. 12/26/22 03/26/23  Osvaldo Shipper, MD  Cholecalciferol (VITAMIN D PO) Take 5,000 Int'l Units by mouth daily.    [provider]  ezetimibe (ZETIA) 10 MG tablet TAKE 1 TABLET BY MOUTH  DAILY FOR CHOLESTEROL 04/20/22   Adela Glimpse, NP  famotidine (PEPCID) 20 MG tablet TAKE 1 TABLET BY MOUTH TWICE  DAILY BEFORE BREAKFAST AND  SUPPER TO PREVENT  HEARTBURN AND  INDIGESTION 01/11/23   Raynelle Dick, NP  furosemide (LASIX) 40 MG tablet Take 1 tablet Daily as needed for  Fluid Retention / Ankle Swelling 11/09/22   [name], MD  gabapentin (NEURONTIN) 300 MG capsule TAKE 1 TO 2 CAPSULES BY MOUTH 1  TO 2 HOURS BEFORE BEDTIME AS  NEEDED FOR SLEEP 12/08/22   Adela Glimpse, NP  gemfibrozil (LOPID) 600 MG tablet TAKE 1 TABLET BY MOUTH TWICE  DAILY WITH MEALS FOR CHOLESTEROL 07/21/22   Raynelle Dick, NP  oxybutynin (DITROPAN-XL) 10 MG 24 hr tablet Take  1 tablet Daily for Bladder                                                                 /                                                     TAKE                       BY                                                 MOUTH Patient taking differently: Take 10 mg by mouth daily as needed (retention). Take  1 tablet Daily for Bladder                                                                 /  TAKE                       BY                                                 MOUTH 12/14/22   [name], MD  thiamine (VITAMIN B-1) 100 MG tablet Take 1 tablet (100 mg total) by mouth daily. 12/27/22   Osvaldo Shipper, MD  triamcinolone (NASACORT) 55 MCG/ACT AERO nasal inhaler Place 2 sprays into the nose at bedtime. Patient taking differently: Place 2 sprays into the nose daily as needed (allergies). 07/01/21 12/31/22  Raynelle Dick, NP    Family History    Family History  Problem Relation Age of Onset   Hypertension Mother    Stroke Father    Seizures Sister    Colon cancer Neg Hx    Stomach cancer Neg Hx    Esophageal cancer Neg Hx    Inflammatory bowel disease Neg Hx    Liver disease Neg Hx    Pancreatic cancer Neg Hx    Rectal cancer Neg Hx    He indicated that his mother is deceased. He indicated that his father is deceased. He indicated that the status of his sister is unknown. He indicated that his son is alive.  He indicated that the status of his neg hx is unknown.  Social History    Social History   Socioeconomic History   Marital status: Married    Spouse name: Not on file   Number of children: Not on file   Years of education: Not on file   Highest education level: Not on file  Occupational History   Not on file  Tobacco Use   Smoking status: Never   Smokeless tobacco: Never  Vaping Use   Vaping status: Never Used  Substance and Sexual Activity   Alcohol use: Yes    Alcohol/week: 15.0 standard drinks of alcohol    Types: 15 Standard drinks or equivalent per week    Comment: every day 2-3 drimks a day    Drug use: No   Sexual activity: Not on file  Other Topics Concern   Not on file  Social History Narrative   Not on file   Social Determinants of Health   Financial Resource Strain: Not on file  Food Insecurity: No Food Insecurity (12/24/2022)   Hunger Vital Sign    Worried About Running Out of Food in the Last Year: Never true    Ran Out of Food in the Last Year: Never true  Transportation Needs: No Transportation Needs (12/24/2022)   PRAPARE - Administrator, Civil Service (Medical): No    Lack of Transportation (Non-Medical): No  Physical Activity: Not on file  Stress: Not on file  Social Connections: Not on file  Intimate Partner Violence: Not At Risk (12/24/2022)   Humiliation, Afraid, Rape, and Kick questionnaire    Fear of Current or Ex-Partner: No    Emotionally Abused: No    Physically Abused: No    Sexually Abused: No     Review of Systems    General:  No chills, fever, night sweats or weight changes.  Cardiovascular:  No chest pain, dyspnea on exertion, edema, orthopnea, palpitations, paroxysmal nocturnal dyspnea. Dermatological: No rash, lesions/masses Respiratory: No cough, dyspnea Urologic: No  hematuria, dysuria Abdominal:   No nausea, vomiting, diarrhea, bright red blood per rectum, melena, or hematemesis Neurologic:  No visual changes,  wkns, changes in mental status. All other systems reviewed and are otherwise negative except as noted above.  Physical Exam    VS:  There were no vitals taken for this visit. , BMI There is no height or weight on file to calculate BMI. GEN: Well nourished, well developed, in no acute distress. HEENT: normal. Neck: Supple, no JVD, carotid bruits, or masses. Cardiac: RRR, no murmurs, rubs, or gallops. No clubbing, cyanosis, edema.  Radials/DP/PT 2+ and equal bilaterally.  Respiratory:  Respirations regular and unlabored, clear to auscultation bilaterally. GI: Soft, nontender, nondistended, BS + x 4. MS: no deformity or atrophy. Skin: warm and dry, no rash. Neuro:  Strength and sensation are intact. Psych: Normal affect.  Accessory Clinical Findings    Recent Labs: 11/09/2022: Magnesium 2.2; TSH 2.72 12/24/2022: ALT 18; B Natriuretic Peptide 21.8 12/26/2022: BUN 19; Creatinine, Ser 0.99; Hemoglobin 13.0; Platelets 215; Potassium 3.9; Sodium 137   Recent Lipid Panel    Component Value Date/Time   CHOL 155 12/26/2022 0230   TRIG 67 12/26/2022 0230   HDL 74 12/26/2022 0230   CHOLHDL 2.1 12/26/2022 0230   VLDL 13 12/26/2022 0230   LDLCALC 68 12/26/2022 0230   LDLCALC 113 (H) 11/09/2022 1530    No BP recorded.  {Refresh Note OR Click here to enter BP  :1}***    ECG personally reviewed by me today- ***     Echocardiogram 12/25/2022   IMPRESSIONS     1. Left ventricular ejection fraction, by estimation, is >75%. The left  ventricle has hyperdynamic function. The left ventricle has no regional  wall motion abnormalities. There is mild left ventricular hypertrophy of  the basal-septal segment. Left  ventricular diastolic parameters are indeterminate.   2. Right ventricular systolic function is normal. The right ventricular  size is normal. There is mildly elevated pulmonary artery systolic  pressure. The estimated right ventricular systolic pressure is 37.3 mmHg.   3. The  mitral valve is degenerative. Trivial mitral valve regurgitation.  No evidence of mitral stenosis.   4. The aortic valve is tricuspid. There is moderate calcification of the  aortic valve. There is moderate thickening of the aortic valve. Aortic  valve regurgitation is trivial. Mild aortic valve stenosis. Aortic valve  area, by VTI measures 1.93 cm.  Aortic valve mean gradient measures 14.0 mmHg. Aortic valve Vmax measures  2.63 m/s.   Comparison(s): No prior Echocardiogram.   FINDINGS   Left Ventricle: Left ventricular ejection fraction, by estimation, is  >75%. The left ventricle has hyperdynamic function. The left ventricle has  no regional wall motion abnormalities. The left ventricular internal  cavity size was normal in size. There  is mild left ventricular hypertrophy of the basal-septal segment. Left  ventricular diastolic parameters are indeterminate. Normal left  ventricular filling pressure.   Right Ventricle: The right ventricular size is normal. No increase in  right ventricular wall thickness. Right ventricular systolic function is  normal. There is mildly elevated pulmonary artery systolic pressure. The  tricuspid regurgitant velocity is 2.93   m/s, and with an assumed right atrial pressure of 3 mmHg, the estimated  right ventricular systolic pressure is 37.3 mmHg.   Left Atrium: Left atrial size was normal in size.   Right Atrium: Right atrial size was normal in size.   Pericardium: There is no evidence of pericardial effusion.  Mitral Valve: The mitral valve is degenerative in appearance. There is  mild thickening of the mitral valve leaflet(s). There is mild  calcification of the mitral valve leaflet(s). Normal mobility of the  mitral valve leaflets. Mild mitral annular  calcification. Trivial mitral valve regurgitation. No evidence of mitral  valve stenosis.   Tricuspid Valve: The tricuspid valve is normal in structure. Tricuspid  valve regurgitation is  trivial. No evidence of tricuspid stenosis.   Aortic Valve: The aortic valve is tricuspid. There is moderate  calcification of the aortic valve. There is moderate thickening of the  aortic valve. There is mild to moderate aortic valve annular  calcification. Aortic valve regurgitation is trivial. Mild  aortic stenosis is present. Aortic valve mean gradient measures 14.0 mmHg.  Aortic valve peak gradient measures 27.7 mmHg. Aortic valve area, by VTI  measures 1.93 cm.   Pulmonic Valve: The pulmonic valve was normal in structure. Pulmonic valve  regurgitation is trivial. No evidence of pulmonic stenosis.   Aorta: The aortic root and ascending aorta are structurally normal, with  no evidence of dilitation.   IAS/Shunts: There is redundancy of the interatrial septum. The atrial  septum is grossly normal.      Assessment & Plan   1.  ***   Thomasene Ripple. Keon Benscoter NP-C     01/25/2023, 7:13 AM [facility] [address] Office [phone] Fax [phone]    I spent***minutes examining this patient, reviewing medications, and using patient centered shared decision making involving her cardiac care.   I spent greater than 20 minutes reviewing her past medical history,  medications, and prior cardiac tests.

## 2023-01-26 ENCOUNTER — Ambulatory Visit: Payer: Medicare Other | Admitting: Nurse Practitioner

## 2023-01-27 ENCOUNTER — Ambulatory Visit: Payer: Medicare Other | Admitting: Pulmonary Disease

## 2023-01-27 ENCOUNTER — Encounter: Payer: Self-pay | Admitting: Pulmonary Disease

## 2023-01-27 VITALS — BP 124/78 | HR 64 | Ht 71.0 in | Wt 173.0 lb

## 2023-01-27 DIAGNOSIS — J9383 Other pneumothorax: Secondary | ICD-10-CM | POA: Diagnosis not present

## 2023-01-27 NOTE — Progress Notes (Signed)
@Patient  ID: Jeremy Boone, male    DOB: [birth year], 88 y.o.   MRN: [id number]  Chief Complaint  Patient presents with   Spontaneous Pneumothorax    Referring provider: [name], MD  HPI:   88 y.o. man whom are seen for follow-up of spontaneous pneumothorax.  Discharge summary reviewed.  Multiple pulmonary consult notes in the hospital reviewed.  Most recent pulmonary note about 4 weeks ago Rhunette Croft, NP reviewed.  Returns for routine follow-up.  Scheduled follow-up.  Doing well.  No chest pain.  Occasional bouts of dyspnea.  Last a few seconds and improved.  Sometimes with exertion.  Sometimes at rest.  Again not prolonged and not reliably reproducible.  Reviewed chest x-ray at last visit that shows complete aeration of the right lung with some residual atelectasis or scarring.  Notably on the CT scan in the hospital reviewed which shows some atelectasis bilateral lower lobes.    Questionaires / Pulmonary Flowsheets:   ACT:      No data to display          MMRC:     No data to display          Epworth:      No data to display          Tests:   FENO:  No results found for: "NITRICOXIDE"  PFT:     No data to display          WALK:      No data to display          Imaging: Personally reviewed and as per EMR and discussion in this note No results found.  Lab Results: Personally reviewed CBC    Component Value Date/Time   WBC 4.9 12/26/2022 0230   RBC 4.36 12/26/2022 0230   HGB 13.0 12/26/2022 0230   HCT 39.2 12/26/2022 0230   PLT 215 12/26/2022 0230   MCV 89.9 12/26/2022 0230   MCH 29.8 12/26/2022 0230   MCHC 33.2 12/26/2022 0230   RDW 13.7 12/26/2022 0230   LYMPHSABS 2.1 12/24/2022 0733   MONOABS 0.4 12/24/2022 0733   EOSABS 0.5 12/24/2022 0733   BASOSABS 0.1 12/24/2022 0733    BMET    Component Value Date/Time   NA 137 12/26/2022 0230   K 3.9 12/26/2022 0230   CL 101 12/26/2022 0230   CO2 28 12/26/2022 0230    GLUCOSE 114 (H) 12/26/2022 0230   BUN 19 12/26/2022 0230   CREATININE 0.99 12/26/2022 0230   CREATININE 1.05 11/09/2022 1530   CALCIUM 8.8 (L) 12/26/2022 0230   GFRNONAA >60 12/26/2022 0230   GFRNONAA 69 06/27/2020 1536   GFRAA 80 06/27/2020 1536    BNP    Component Value Date/Time   BNP 21.8 12/24/2022 0733    ProBNP No results found for: "PROBNP"  Specialty Problems       Pulmonary Problems   OSA on CPAP   Spontaneous pneumothorax    Allergies  Allergen Reactions   Ace Inhibitors Cough   Lipitor [Atorvastatin]     Elevates CPK and Aldolase    Immunization History  Administered Date(s) Administered   Influenza, High Dose Seasonal PF 12/18/2013, 10/28/2015, 12/10/2016, 01/26/2018, 11/10/2018, 12/24/2020, 01/14/2022   Influenza,inj,Quad PF,6+ Mos 03/07/2013   Influenza-Unspecified 12/01/2022   Moderna Sars-Covid-2 Vaccination 03/14/2019, 04/11/2019, 02/04/2020   PNEUMOCOCCAL CONJUGATE-20 07/01/2021   Pneumococcal Conjugate-13 03/28/2014   Pneumococcal-Unspecified 03/03/2007   Td 03/02/2004   Tdap 10/25/2014   Unspecified SARS-COV-2 Vaccination 12/01/2022  Zoster, Live 08/26/2016    Past Medical History:  Diagnosis Date   Gout    Hyperlipidemia    Hypertension    Prediabetes     Tobacco History: Social History   Tobacco Use  Smoking Status Never  Smokeless Tobacco Never   Counseling given: Not Answered   Continue to not smoke  Outpatient Encounter Medications as of 01/27/2023  Medication Sig   allopurinol (ZYLOPRIM) 300 MG tablet Take  1 tablet  Daily for For Gout Prevention                                                                /                                                                   TAKE                                         BY                                                 MOUTH   amLODipine (NORVASC) 2.5 MG tablet Take 1 tablet (2.5 mg total) by mouth daily.   aspirin EC 81 MG tablet Take 1 tablet (81 mg total) by mouth  daily. Swallow whole.   Cholecalciferol (VITAMIN D PO) Take 5,000 Int'l Units by mouth daily.   ezetimibe (ZETIA) 10 MG tablet TAKE 1 TABLET BY MOUTH  DAILY FOR CHOLESTEROL   famotidine (PEPCID) 20 MG tablet TAKE 1 TABLET BY MOUTH TWICE  DAILY BEFORE BREAKFAST AND  SUPPER TO PREVENT HEARTBURN AND  INDIGESTION   furosemide (LASIX) 40 MG tablet Take 1 tablet Daily as needed for  Fluid Retention / Ankle Swelling   gabapentin (NEURONTIN) 300 MG capsule TAKE 1 TO 2 CAPSULES BY MOUTH 1  TO 2 HOURS BEFORE BEDTIME AS  NEEDED FOR SLEEP   gemfibrozil (LOPID) 600 MG tablet TAKE 1 TABLET BY MOUTH TWICE  DAILY WITH MEALS FOR CHOLESTEROL   oxybutynin (DITROPAN-XL) 10 MG 24 hr tablet Take  1 tablet Daily for Bladder                                                                 /                                                     TAKE  BY                                                 MOUTH (Patient taking differently: Take 10 mg by mouth daily as needed (retention). Take  1 tablet Daily for Bladder                                                                 /                                                     TAKE                       BY                                                 MOUTH)   thiamine (VITAMIN B1) 100 MG tablet Take 100 mg by mouth daily.   triamcinolone (NASACORT) 55 MCG/ACT AERO nasal inhaler Place 2 sprays into the nose at bedtime. (Patient taking differently: Place 2 sprays into the nose daily as needed (allergies).)   [DISCONTINUED] thiamine (VITAMIN B-1) 100 MG tablet Take 1 tablet (100 mg total) by mouth daily.   No facility-administered encounter medications on file as of 01/27/2023.     Review of Systems  Review of Systems  No chest pain with exertion.  No pleuritic chest pain.  No orthopnea or PND.  Comprehensive review of systems otherwise negative. Physical Exam  BP 124/78   Pulse 64   Ht 5\' 11"  (1.803 m)   Wt 173 lb (78.5 kg)   SpO2 95%   BMI  24.13 kg/m   Wt Readings from Last 5 Encounters:  01/27/23 173 lb (78.5 kg)  12/31/22 174 lb 6.4 oz (79.1 kg)  12/28/22 178 lb 9.6 oz (81 kg)  12/25/22 166 lb 4.8 oz (75.4 kg)  11/09/22 178 lb 6.4 oz (80.9 kg)    BMI Readings from Last 5 Encounters:  01/27/23 24.13 kg/m  12/31/22 24.32 kg/m  12/28/22 24.91 kg/m  12/25/22 23.19 kg/m  11/09/22 24.53 kg/m     Physical Exam General: Sitting in chair, no acute distress Eyes: EOMI, no icterus Neck: Supple, no JVP Pulmonary: Clear, symmetric, normal work of breathing Cardiovascular: Warm, no edema Abdomen: Nondistended, bowel sounds present MSK: No synovitis, no joint effusion Neuro: Normal gait, no weakness Psych: Normal mood, full affect   Assessment & Plan:   Primary spontaneous pneumothorax: Although query possible mild emphysematous changes on CT scan he denies cigarette smoking.  Chest tube was placed.  Lung was up to 4 weeks ago at recheck.  Symmetrical air entry bilaterally.  No evidence for recurrence.  No symptoms to suggest recurrence.  No further imaging or workup needed.  No restrictions on activity or travel.   Return if symptoms  worsen or fail to improve.   Karren Burly, MD 01/27/2023   This appointment required 31 minutes of patient care (this includes precharting, chart review, review of results, face-to-face care, etc.).

## 2023-01-31 ENCOUNTER — Other Ambulatory Visit: Payer: Self-pay | Admitting: Internal Medicine

## 2023-02-01 ENCOUNTER — Other Ambulatory Visit: Payer: Self-pay

## 2023-02-01 ENCOUNTER — Ambulatory Visit: Payer: Medicare Other | Attending: General Practice | Admitting: General Practice

## 2023-02-01 ENCOUNTER — Other Ambulatory Visit: Payer: Self-pay | Admitting: Nurse Practitioner

## 2023-02-01 DIAGNOSIS — E782 Mixed hyperlipidemia: Secondary | ICD-10-CM

## 2023-02-01 MED ORDER — VITAMIN B-1 100 MG PO TABS
100.0000 mg | ORAL_TABLET | Freq: Every day | ORAL | 1 refills | Status: DC
  Filled 2023-02-01: qty 90, 90d supply, fill #0

## 2023-02-03 ENCOUNTER — Emergency Department (HOSPITAL_COMMUNITY): Payer: Medicare Other

## 2023-02-03 ENCOUNTER — Other Ambulatory Visit: Payer: Self-pay

## 2023-02-03 ENCOUNTER — Encounter (HOSPITAL_COMMUNITY): Payer: Self-pay

## 2023-02-03 ENCOUNTER — Emergency Department (HOSPITAL_COMMUNITY)
Admission: EM | Admit: 2023-02-03 | Discharge: 2023-02-03 | Disposition: A | Discharge status: Home / Self Care | Payer: Medicare Other | Attending: Emergency Medicine | Admitting: Emergency Medicine

## 2023-02-03 DIAGNOSIS — R0989 Other specified symptoms and signs involving the circulatory and respiratory systems: Secondary | ICD-10-CM | POA: Diagnosis not present

## 2023-02-03 DIAGNOSIS — J984 Other disorders of lung: Secondary | ICD-10-CM | POA: Diagnosis not present

## 2023-02-03 DIAGNOSIS — M546 Pain in thoracic spine: Secondary | ICD-10-CM | POA: Diagnosis not present

## 2023-02-03 DIAGNOSIS — R001 Bradycardia, unspecified: Secondary | ICD-10-CM | POA: Diagnosis not present

## 2023-02-03 DIAGNOSIS — I517 Cardiomegaly: Secondary | ICD-10-CM | POA: Diagnosis not present

## 2023-02-03 DIAGNOSIS — J9811 Atelectasis: Secondary | ICD-10-CM | POA: Diagnosis not present

## 2023-02-03 DIAGNOSIS — I499 Cardiac arrhythmia, unspecified: Secondary | ICD-10-CM | POA: Diagnosis not present

## 2023-02-03 DIAGNOSIS — Z79899 Other long term (current) drug therapy: Secondary | ICD-10-CM | POA: Diagnosis not present

## 2023-02-03 DIAGNOSIS — J939 Pneumothorax, unspecified: Secondary | ICD-10-CM | POA: Diagnosis not present

## 2023-02-03 DIAGNOSIS — I10 Essential (primary) hypertension: Secondary | ICD-10-CM | POA: Insufficient documentation

## 2023-02-03 DIAGNOSIS — R079 Chest pain, unspecified: Secondary | ICD-10-CM | POA: Diagnosis not present

## 2023-02-03 DIAGNOSIS — Z7982 Long term (current) use of aspirin: Secondary | ICD-10-CM | POA: Diagnosis not present

## 2023-02-03 DIAGNOSIS — R0689 Other abnormalities of breathing: Secondary | ICD-10-CM | POA: Diagnosis not present

## 2023-02-03 DIAGNOSIS — R0789 Other chest pain: Secondary | ICD-10-CM | POA: Diagnosis not present

## 2023-02-03 LAB — BASIC METABOLIC PANEL
Anion gap: 10 (ref 5–15)
BUN: 14 mg/dL (ref 8–23)
CO2: 24 mmol/L (ref 22–32)
Calcium: 9 mg/dL (ref 8.9–10.3)
Chloride: 101 mmol/L (ref 98–111)
Creatinine, Ser: 1.04 mg/dL (ref 0.61–1.24)
GFR, Estimated: >60 mL/min (ref >60)
Glucose, Bld: 101 mg/dL — ABNORMAL HIGH (ref 70–99)
Potassium: 3.9 mmol/L (ref 3.5–5.1)
Sodium: 135 mmol/L (ref 135–145)

## 2023-02-03 LAB — CBC
HCT: 40.4 % (ref 39.0–52.0)
Hemoglobin: 13.2 g/dL (ref 13.0–17.0)
MCH: 29.8 pg (ref 26.0–34.0)
MCHC: 32.7 g/dL (ref 30.0–36.0)
MCV: 91.2 fL (ref 80.0–100.0)
Platelets: 231 10*3/uL (ref 150–400)
RBC: 4.43 MIL/uL (ref 4.22–5.81)
RDW: 13.5 % (ref 11.5–15.5)
WBC: 4.7 10*3/uL (ref 4.0–10.5)
nRBC: 0 % (ref 0.0–0.2)

## 2023-02-03 LAB — HEPATIC FUNCTION PANEL
ALT: 11 U/L (ref 0–44)
AST: 21 U/L (ref 15–41)
Albumin: 3.5 g/dL (ref 3.5–5.0)
Alkaline Phosphatase: 54 U/L (ref 38–126)
Bilirubin, Direct: 0.1 mg/dL (ref 0.0–0.2)
Indirect Bilirubin: 0.6 mg/dL (ref 0.3–0.9)
Total Bilirubin: 0.7 mg/dL (ref <1.2)
Total Protein: 7.3 g/dL (ref 6.5–8.1)

## 2023-02-03 LAB — MAGNESIUM: Magnesium: 2 mg/dL (ref 1.7–2.4)

## 2023-02-03 LAB — TROPONIN I (HIGH SENSITIVITY)
Troponin I (High Sensitivity): 8 ng/L (ref <18)
Troponin I (High Sensitivity): 7 ng/L (ref <18)

## 2023-02-03 LAB — LIPASE, BLOOD: Lipase: 29 U/L (ref 11–51)

## 2023-02-03 MED ORDER — IOHEXOL 350 MG/ML SOLN
60.0000 mL | Freq: Once | INTRAVENOUS | Status: AC | PRN
  Administered 2023-02-03: 60 mL via INTRAVENOUS

## 2023-02-03 NOTE — ED Triage Notes (Signed)
Patient reports right sided back back that radiates to his chest. Patient states it started this morning and improved throughout the day. Pain was worse when he laid down. Patient had a spontaneous pneumothorax 2 months ago, with a chest tube. EMS gave 324 of ASA. No thinners.

## 2023-02-03 NOTE — ED Provider Notes (Signed)
[place] EMERGENCY DEPARTMENT AT [facility] Provider Note   CSN: [id number] Arrival date & time: 02/03/23  4098     History  Chief Complaint  Patient presents with   Chest Pain    Jeremy Boone is a 88 y.o. male.   Chest Pain Associated symptoms: back pain   Patient presents for pain in area of right scapula.  Medical history includes gout, HLD, HTN, prediabetes.  6 weeks ago, he was admitted to the hospital with a spontaneous pneumothorax of right lung.  He underwent chest tube placement.  Chest tube was removed prior to his discharge 2 days later.  Yesterday morning, patient had onset of pain to area of right thoracic back.  He was at home, not exerting himself at the time.  He denies any recent injuries.  His pain subsided throughout the day but then returned again tonight.  He arrives via EMS.  He did receive 324 of ASA prior to arrival.  Currently, he is pain-free.     Home Medications Prior to Admission medications   Medication Sig Start Date End Date Taking? Authorizing Provider  allopurinol (ZYLOPRIM) 300 MG tablet Take  1 tablet  Daily for For Gout Prevention                                                                /                                                                   TAKE                                         BY                                                 MOUTH 10/24/22   [name], MD  amLODipine (NORVASC) 2.5 MG tablet Take 1 tablet (2.5 mg total) by mouth daily. 12/27/22   Osvaldo Shipper, MD  aspirin EC 81 MG tablet Take 1 tablet (81 mg total) by mouth daily. Swallow whole. 12/26/22 03/26/23  Osvaldo Shipper, MD  Cholecalciferol (VITAMIN D PO) Take 5,000 Int'l Units by mouth daily.    [provider]  ezetimibe (ZETIA) 10 MG tablet TAKE 1 TABLET BY MOUTH DAILY FOR CHOLESTEROL 02/02/23   Raynelle Dick, NP  famotidine (PEPCID) 20 MG tablet TAKE 1 TABLET BY MOUTH TWICE  DAILY BEFORE BREAKFAST AND  SUPPER TO PREVENT  HEARTBURN AND  INDIGESTION 01/11/23   Raynelle Dick, NP  furosemide (LASIX) 40 MG tablet Take 1 tablet Daily as needed for  Fluid Retention / Ankle Swelling 11/09/22   [name], MD  gabapentin (NEURONTIN) 300 MG capsule TAKE 1 TO 2 CAPSULES BY MOUTH 1  TO 2 HOURS BEFORE BEDTIME AS  NEEDED  FOR SLEEP 12/08/22   Adela Glimpse, NP  gemfibrozil (LOPID) 600 MG tablet TAKE 1 TABLET BY MOUTH TWICE  DAILY WITH MEALS FOR CHOLESTEROL 07/21/22   Raynelle Dick, NP  oxybutynin (DITROPAN-XL) 10 MG 24 hr tablet Take  1 tablet Daily for Bladder                                                                 /                                                     TAKE                       BY                                                 MOUTH Patient taking differently: Take 10 mg by mouth daily as needed (retention). Take  1 tablet Daily for Bladder                                                                 /                                                     TAKE                       BY                                                 MOUTH 12/14/22   [name], MD  thiamine (VITAMIN B-1) 100 MG tablet Take 1 tablet (100 mg total) by mouth daily. 02/01/23   Raynelle Dick, NP  triamcinolone (NASACORT) 55 MCG/ACT AERO nasal inhaler Place 2 sprays into the nose at bedtime. Patient taking differently: Place 2 sprays into the nose daily as needed (allergies). 07/01/21 12/31/22  Raynelle Dick, NP      Allergies    Ace inhibitors and Lipitor [atorvastatin]    Review of Systems   Review of Systems  Musculoskeletal:  Positive for back pain.  All other systems reviewed and are negative.   Physical Exam Updated Vital Signs BP (!) 149/88 (BP Location: Right Arm)   Pulse 65   Temp 98.1 F (36.7 C) (Oral)   Resp 16   SpO2 98%  Physical Exam Vitals and nursing note reviewed.  Constitutional:  General: He is not in acute distress.    Appearance: He is well-developed. He is not  ill-appearing, toxic-appearing or diaphoretic.  HENT:     Head: Normocephalic and atraumatic.  Eyes:     Conjunctiva/sclera: Conjunctivae normal.  Cardiovascular:     Rate and Rhythm: Normal rate and regular rhythm.     Heart sounds: No murmur heard. Pulmonary:     Effort: Pulmonary effort is normal. No tachypnea or respiratory distress.     Breath sounds: Normal breath sounds. No decreased breath sounds, wheezing, rhonchi or rales.  Chest:     Chest wall: No tenderness.  Abdominal:     Palpations: Abdomen is soft.     Tenderness: There is no abdominal tenderness.  Musculoskeletal:        General: No swelling. Normal range of motion.     Cervical back: Neck supple.  Skin:    General: Skin is warm and dry.     Coloration: Skin is not cyanotic or pale.  Neurological:     General: No focal deficit present.     Mental Status: He is alert and oriented to person, place, and time.  Psychiatric:        Mood and Affect: Mood normal.        Behavior: Behavior normal.     ED Results / Procedures / Treatments   Labs (all labs ordered are listed, but only abnormal results are displayed) Labs Reviewed  BASIC METABOLIC PANEL - Abnormal; Notable for the following components:      Result Value   Glucose, Bld 101 (*)    All other components within normal limits  CBC  LIPASE, BLOOD  HEPATIC FUNCTION PANEL  MAGNESIUM  TROPONIN I (HIGH SENSITIVITY)  TROPONIN I (HIGH SENSITIVITY)    EKG EKG Interpretation Date/Time:  Wednesday February 03 2023 02:37:20 EST Ventricular Rate:  51 PR Interval:  198 QRS Duration:  86 QT Interval:  430 QTC Calculation: 396 R Axis:   -7  Text Interpretation: Sinus bradycardia Minimal voltage criteria for LVH, may be normal variant ( R in aVL ) Nonspecific T wave abnormality Confirmed by Gloris Manchester ([id number]) on 02/03/2023 3:18:49 AM  Radiology CT Angio Chest PE W and/or Wo Contrast  Result Date: 02/03/2023 CLINICAL DATA:  88 year old male with pain  radiating to the chest and right back, onset this morning. History of right side pneumothorax last month. EXAM: CT ANGIOGRAPHY CHEST WITH CONTRAST TECHNIQUE: Multidetector CT imaging of the chest was performed using the standard protocol during bolus administration of intravenous contrast. Multiplanar CT image reconstructions and MIPs were obtained to evaluate the vascular anatomy. RADIATION DOSE REDUCTION: This exam was performed according to the departmental dose-optimization program which includes automated exposure control, adjustment of the mA and/or kV according to patient size and/or use of iterative reconstruction technique. CONTRAST:  60mL OMNIPAQUE IOHEXOL 350 MG/ML SOLN COMPARISON:  Chest radiographs 0304 hours today. CT Chest, Abdomen, and Pelvis 12/24/2022. FINDINGS: Cardiovascular: Excellent contrast bolus timing in the pulmonary arterial tree. No pulmonary artery filling defect. Some enlargement, tortuosity of the central pulmonary arteries bilaterally. Calcified aortic atherosclerosis. No contrast in the aorta today. Stable heart size, upper limits of normal. No pericardial effusion. Mediastinum/Nodes: Negative. No mediastinal mass or lymphadenopathy. Lungs/Pleura: Removed right side chest tube and resolved right pneumothorax since last month. Underlying chronic lung disease with architectural distortion at both lung bases. Occasional small calcified pleural plaques. Lower lung volumes with atelectasis, and ventilation otherwise not significantly changed from the prior CT.  Major airways remain patent. No pleural effusion. Upper Abdomen: Minimal contrast reflux to the hepatic IVC. Visible noncontrast liver, gallbladder, spleen, pancreas, adrenal glands, right kidney appear negative. Visible bowel and left kidney appears stable, including splenic flexure diverticulosis. No free air or free fluid in the visible upper abdomen. Musculoskeletal: No acute osseous abnormality identified. Review of the MIP  images confirms the above findings. IMPRESSION: 1. Negative for acute pulmonary embolus. 2. Chronic lung disease. Removed right chest tube and resolved right pneumothorax since last month. Lower lung volumes now with atelectasis. 3.  Aortic Atherosclerosis (ICD10-I70.0). Electronically Signed   By: Odessa Fleming M.D.   On: 02/03/2023 05:07   DG Chest 2 View  Result Date: 02/03/2023 CLINICAL DATA:  Right-sided chest pain EXAM: CHEST - 2 VIEW COMPARISON:  12/28/2022 FINDINGS: Cardiac enlargement. No vascular congestion or edema. Scarring in the right lung base is unchanged. No developing airspace disease or consolidation. Degenerative changes in the spine and shoulders. No pleural effusions. No pneumothorax. Mediastinal contours appear intact. IMPRESSION: Cardiac enlargement. Scarring in the right lung base. No evidence of active pulmonary disease. Electronically Signed   By: Burman Nieves M.D.   On: 02/03/2023 03:15    Procedures Procedures    Medications Ordered in ED Medications  iohexol (OMNIPAQUE) 350 MG/ML injection 60 mL (60 mLs Intravenous Contrast Given 02/03/23 0416)    ED Course/ Medical Decision Making/ A&P                                 Medical Decision Making Amount and/or Complexity of Data Reviewed Labs: ordered. Radiology: ordered.  Risk Prescription drug management.   This patient presents to the ED for concern of right thoracic back pain, this involves an extensive number of treatment options, and is a complaint that carries with it a high risk of complications and morbidity.  The differential diagnosis includes recurrence of pneumothorax, PE, ACS, dissection, pancreatitis, pneumonia, musculoskeletal etiology   Co morbidities that complicate the patient evaluation  gout, HLD, HTN, prediabetes, spontaneous pneumothorax   Additional history obtained:  Additional history obtained from N/A External records from outside source obtained and reviewed including  EMR   Lab Tests:  I Ordered, and personally interpreted labs.  The pertinent results include: Normal kidney function, normal electrolytes, no leukocytosis, normal troponins   Imaging Studies ordered:  I ordered imaging studies including chest x-ray, CTA chest I independently visualized and interpreted imaging which showed no acute findings.  Resolution of prior pneumothorax. I agree with the radiologist interpretation   Cardiac Monitoring: / EKG:  The patient was maintained on a cardiac monitor.  I personally viewed and interpreted the cardiac monitored which showed an underlying rhythm of: Sinus rhythm  Problem List / ED Course / Critical interventions / Medication management  Patient presenting for acute onset of pain in area of right scapula yesterday morning with recurrence of similar pain tonight.  Vital signs on arrival are notable for mild hypertension.  On exam, patient is well-appearing.  He denies any pain currently.  His breathing is unlabored.  Lungs are clear to auscultation.  He has no worsened pain with right shoulder range of motion.  He has no areas of tenderness.  EKG does not show any evidence of STEMI.  Lab work and imaging studies were ordered.  Results of studies were reassuring.  Patient remained asymptomatic while in the ED.  He was discharged in stable condition.  Social Determinants of Health:  Has PCP        Final Clinical Impression(s) / ED Diagnoses Final diagnoses:  Acute right-sided thoracic back pain    Rx / DC Orders ED Discharge Orders     None         Gloris Manchester, MD 02/03/23 0700

## 2023-02-03 NOTE — Discharge Instructions (Signed)
 Test results today are reassuring.  Take ibuprofen and Tylenol as needed for pain.  Return to the emergency department for any new or worsening symptoms of concern.

## 2023-02-04 ENCOUNTER — Other Ambulatory Visit: Payer: Self-pay

## 2023-02-08 ENCOUNTER — Ambulatory Visit: Payer: Medicare Other | Admitting: Nurse Practitioner

## 2023-02-16 NOTE — Progress Notes (Unsigned)
Future Appointments  Date Time Provider Department Center  02/17/2023 11:00 AM [name], MD [facility] None  03/11/2023  3:30 PM Raynelle Dick, NP [facility] None  03/16/2023 11:20 AM Ronney Asters, NP [facility] None  06/24/2023 10:00 AM [name], MD [facility] None  09/27/2023  3:30 PM Raynelle Dick, NP [facility] None    History of Present Illness:     This very nice 88 y.o. MBM presents for 6  month follow up with HTN, HLD, Pre-Diabetes, Vitamin B12 Deficiency and Vitamin D Deficiency was hospitalized 12/23/24/2024 with a spontaneous PTX and had chest tube placed for 2 days. He represented to the ER  on Dec 4 with c/o Rt chest pain and w/u was negative w/Negative CXR & EKG/Troponins.  He presents now for ER F/U.             Patient is treated for HTN  since  & BP has been controlled at home. Today's  . Patient has had no complaints of any cardiac type chest pain, palpitations, dyspnea Pollyann Kennedy /PND, dizziness, claudication  or dependent edema.        Hyperlipidemia is controlled with diet & meds. Patient denies myalgias or other med SE's. Last Lipids were  Lab Results  Component Value Date   CHOL 155 12/26/2022   HDL 74 12/26/2022   LDLCALC 68 12/26/2022   TRIG 67 12/26/2022   CHOLHDL 2.1 12/26/2022      Also, the patient has history of T2_NIDDM PreDiabetes and has had no symptoms of reactive hypoglycemia, diabetic polys, paresthesias or visual blurring.  Last A1c was   Lab Results  Component Value Date   HGBA1C 6.4 (H) 11/09/2022         Further, the patient also has history of Vitamin D Deficiency and supplements vitamin D . Last vitamin D was  Lab Results  Component Value Date   VD25OH 14 11/09/2022      Current Outpatient Medications on File Prior to Visit  Medication Sig   allopurinol (ZYLOPRIM) 300 MG tablet Take  1 tablet  Daily for For Gout Prevention                                                                /                                                                    TAKE                                         BY                                                 MOUTH   amLODipine (NORVASC) 2.5 MG tablet Take 1 tablet (2.5 mg total) by mouth daily.  aspirin EC 81 MG tablet Take 1 tablet (81 mg total) by mouth daily. Swallow whole.   Cholecalciferol (VITAMIN D PO) Take 5,000 Int'l Units by mouth daily.   ezetimibe (ZETIA) 10 MG tablet TAKE 1 TABLET BY MOUTH DAILY FOR CHOLESTEROL   famotidine (PEPCID) 20 MG tablet TAKE 1 TABLET BY MOUTH TWICE  DAILY BEFORE BREAKFAST AND  SUPPER TO PREVENT HEARTBURN AND  INDIGESTION   furosemide (LASIX) 40 MG tablet Take 1 tablet Daily as needed for  Fluid Retention / Ankle Swelling   gabapentin (NEURONTIN) 300 MG capsule TAKE 1 TO 2 CAPSULES BY MOUTH 1  TO 2 HOURS BEFORE BEDTIME AS  NEEDED FOR SLEEP   gemfibrozil (LOPID) 600 MG tablet TAKE 1 TABLET BY MOUTH TWICE  DAILY WITH MEALS FOR CHOLESTEROL   oxybutynin (DITROPAN-XL) 10 MG 24 hr tablet Take  1 tablet Daily for Bladder                                                                 /                                                     TAKE                       BY                                                 MOUTH (Patient taking differently: Take 10 mg by mouth daily as needed (retention). Take  1 tablet Daily for Bladder                                                                 /                                                     TAKE                       BY                                                 MOUTH)   thiamine (VITAMIN B-1) 100 MG tablet Take 1 tablet (100 mg total) by mouth daily.   triamcinolone (NASACORT) 55 MCG/ACT AERO nasal inhaler Place 2 sprays into the nose at bedtime. (Patient taking differently: Place 2 sprays into the nose daily as needed (allergies).)   No  current facility-administered medications on file prior to visit.      Allergies  Allergen Reactions   Ace Inhibitors  Cough   Lipitor [Atorvastatin]     Elevates CPK and Aldolase      PMHx:   Past Medical History:  Diagnosis Date   Gout    Hyperlipidemia    Hypertension    Prediabetes       Immunization History  Administered Date(s) Administered   Influenza, High Dose Seasonal PF 12/18/2013, 10/28/2015, 12/10/2016, 01/26/2018, 11/10/2018, 12/24/2020, 01/14/2022   Influenza,inj,Quad PF,6+ Mos 03/07/2013   Influenza-Unspecified 12/01/2022   Moderna Sars-Covid-2 Vaccination 03/14/2019, 04/11/2019, 02/04/2020   PNEUMOCOCCAL CONJUGATE-20 07/01/2021   Pneumococcal Conjugate-13 03/28/2014   Pneumococcal-Unspecified 03/03/2007   Td 03/02/2004   Tdap 10/25/2014   Unspecified SARS-COV-2 Vaccination 12/01/2022   Zoster, Live 08/26/2016      Past Surgical History:  Procedure Laterality Date   Cardiac Event Monitor  May-June 2017   Sinus rhythm with average heart rate 74.2 - no notable arrhythmias. Minimal PVCs. No PACs.   CARPAL TUNNEL RELEASE Left 2007   CATARACT EXTRACTION, BILATERAL     OTHER SURGICAL HISTORY  2007   Negative biopsy tonsil mass   ROTATOR CUFF REPAIR Left 2000     FHx:    Reviewed / unchanged   SHx:    Reviewed / unchanged    Systems Review:  Constitutional: Denies fever, chills, wt changes, headaches, insomnia, fatigue, night sweats, change in appetite. Eyes: Denies redness, blurred vision, diplopia, discharge, itchy, watery eyes.  ENT: Denies discharge, congestion, post nasal drip, epistaxis, sore throat, earache, hearing loss, dental pain, tinnitus, vertigo, sinus pain, snoring.  CV: Denies chest pain, palpitations, irregular heartbeat, syncope, dyspnea, diaphoresis, orthopnea, PND, claudication or edema. Respiratory: denies cough, dyspnea, DOE, pleurisy, hoarseness, laryngitis, wheezing.  Gastrointestinal: Denies dysphagia, odynophagia, heartburn, reflux, water brash, abdominal pain or cramps, nausea, vomiting, bloating, diarrhea, constipation, hematemesis,  melena, hematochezia  or hemorrhoids. Genitourinary: Denies dysuria, frequency, urgency, nocturia, hesitancy, discharge, hematuria or flank pain. Musculoskeletal: Denies arthralgias, myalgias, stiffness, jt. swelling, pain, limping or strain/sprain.  Skin: Denies pruritus, rash, hives, warts, acne, eczema or change in skin lesion(s). Neuro: No weakness, tremor, incoordination, spasms, paresthesia or pain. Psychiatric: Denies confusion, memory loss or sensory loss. Endo: Denies change in weight, skin or hair change.  Heme/Lymph: No excessive bleeding, bruising or enlarged lymph nodes.   Physical Exam  There were no vitals taken for this visit.  Appears  well nourished, well groomed  and in no distress.  Eyes: PERRLA, EOMs, conjunctiva no swelling or erythema. Sinuses: No frontal/maxillary tenderness ENT/Mouth: EAC's clear, TM's nl w/o erythema, bulging. Nares clear w/o erythema, swelling, exudates. Oropharynx clear without erythema or exudates. Oral hygiene is good. Tongue normal, non obstructing. Hearing intact.  Neck: Supple. Thyroid not palpable. Car 2+/2+ without bruits, nodes or JVD. Chest: Respirations nl with BS clear & equal w/o rales, rhonchi, wheezing or stridor.  Cor: Heart sounds normal w/ regular rate and rhythm without sig. murmurs, gallops, clicks or rubs. Peripheral pulses normal and equal  without edema.  Abdomen: Soft & bowel sounds normal. Non-tender w/o guarding, rebound, hernias, masses or organomegaly.  Lymphatics: Unremarkable.  Musculoskeletal: Full ROM all peripheral extremities, joint stability, 5/5 strength and normal gait.  Skin: Warm, dry without exposed rashes, lesions or ecchymosis apparent.  Neuro: Cranial nerves intact, reflexes equal bilaterally. Sensory-motor testing grossly intact. Tendon reflexes grossly intact.  Pysch: Alert & oriented x 3.  Insight and judgement nl & appropriate. No  ideations.   Assessment and Plan:  - Continue medication, monitor  blood pressure at home.  - Continue DASH diet.  Reminder to go to the ER if any CP,  SOB, nausea, dizziness, severe HA, changes vision/speech.  - Continue diet/meds, exercise,& lifestyle modifications.  - Continue monitor periodic cholesterol/liver & renal functions    - Continue diet, exercise  - Lifestyle modifications.  - Monitor appropriate labs  - Continue supplementation        Discussed  regular exercise, BP monitoring, weight control to achieve/maintain BMI less than 25 and discussed med and SE's. Recommended labs to assess /monitor clinical status .  I discussed the assessment and treatment plan with the patient. The patient was provided an opportunity to ask questions and all were answered. The patient agreed with the plan and demonstrated an understanding of the instructions.  I provided over 30 minutes of exam, counseling, chart review and  complex critical decision making.        The patient was advised to call back or seek an in-person evaluation if the symptoms worsen or if the condition fails to improve as anticipated.   Marinus Maw, MD

## 2023-02-17 ENCOUNTER — Ambulatory Visit (INDEPENDENT_AMBULATORY_CARE_PROVIDER_SITE_OTHER): Payer: Medicare Other | Admitting: Internal Medicine

## 2023-02-17 VITALS — BP 124/78 | HR 79 | Temp 97.9°F | Resp 16 | Ht 71.5 in | Wt 176.4 lb

## 2023-02-17 DIAGNOSIS — J9383 Other pneumothorax: Secondary | ICD-10-CM

## 2023-02-17 DIAGNOSIS — I10 Essential (primary) hypertension: Secondary | ICD-10-CM

## 2023-02-17 NOTE — Patient Instructions (Signed)
Pneumothorax A pneumothorax is commonly called a collapsed lung. It is a condition in which air leaks from a lung and builds up between the thin layer of tissue that covers the lungs (visceral pleura)and the interior wall of the chest cavity (parietal pleura). The air gets trapped outside the lung, between the lung and the chest wall (pleural space). The air takes up space and prevents the lung from fully expanding. This condition sometimes occurs suddenly with no apparent cause. The buildup of air may be small or large. A small pneumothorax may go away on its own. A large pneumothorax will require treatment and hospitalization. What are the causes? This condition may be caused by: Trauma and injury to the chest wall. Surgery and other medical procedures. A complication of an underlying lung problem, especially chronic obstructive pulmonary disease (COPD) or emphysema. Sometimes the cause of this condition is not known. What increases the risk? You are more likely to develop this condition if: You have an underlying lung problem. You smoke. You are 56-45 years old, male, tall, and underweight. You have a personal or family history of pneumothorax. You have an eating disorder (anorexia nervosa). This condition can also happen quickly, even in people with no history of lung problems. What are the signs or symptoms? Sometimes a pneumothorax will have no symptoms. When symptoms are present, they may include: Chest pain. Shortness of breath. Increased rate of breathing. Bluish color to the skin, lips, or fingernails (cyanosis). How is this diagnosed? This condition may be diagnosed by: A medical history and physical exam. A chest X-ray, chest CT scan, or ultrasound. How is this treated? Treatment depends on how severe your condition is. The goal of treatment is to remove the extra air and allow your lung to expand back to its normal size. For a small pneumothorax: No treatment may be  needed. Extra oxygen is sometimes used to make it go away more quickly. For a large pneumothorax or one that is causing symptoms, a procedure is done to release the air from around your lungs. To do this, a health care provider may use: A needle with a syringe. This is used to suck air from a pleural space where no additional leakage is taking place. A chest tube. This is used to suck air where there is ongoing leakage into the pleural space. The chest tube may need to remain in place for several days until the air leak has healed. In more severe cases, surgery may be needed to repair the damage that is causing the leak. If you have multiple pneumothorax episodes or have an air leak that will not heal, a procedure called a pleurodesis may be done. A medicine is placed in the pleural space to irritate the tissues around the lung so that the lung will stick to the chest wall, seal any leaks, and stop any buildup of air in that space. If you have an underlying lung problem, severe symptoms, or a large pneumothorax you will usually need to stay in the hospital. Follow these instructions at home: Lifestyle Do not use any products that contain nicotine or tobacco. These products include cigarettes, chewing tobacco, and vaping devices, such as e-cigarettes. If you need help quitting, ask your health care provider. Do not lift anything that is heavier than 10 lb (4.5 kg), or the limit that you are told, until your health care provider says that it is safe. Avoid activities that take a lot of effort (are strenuous) for as long as  told by your health care provider. Return to your normal activities as told by your health care provider. Ask your health care provider what activities are safe for you. Do not fly in an airplane or scuba dive until your health care provider says it is okay. General instructions Take over-the-counter and prescription medicines only as told by your health care provider. If a cough or  pain makes it difficult for you to sleep at night, try sleeping in a semi-upright position in a recliner or by using 2 or 3 pillows. If you had a chest tube and it was removed, ask your health care provider when you can remove the bandage (dressing). While the dressing is in place, do not allow it to get wet. Keep all follow-up visits. This is important. Contact a health care provider if: You cough up thick mucus (sputum) that is yellow or green. You were treated with a chest tube, and you have redness, increasing pain, or discharge at the site where it was placed. Get help right away if: You have increasing chest pain or shortness of breath. You have a cough that will not go away. You begin coughing up blood. You have pain that is getting worse or is not controlled with medicines. The site where your chest tube was located opens up. You feel air coming out of the site where the chest tube was placed. You have a fever or symptoms that last for more than 2-3 days. Your skin, lips, or fingernails turn blue. These symptoms may represent a serious problem that is an emergency. Do not wait to see if the symptoms will go away. Get medical help right away. Call your local emergency services (911 in the [place] Do not drive yourself to the hospital. Summary A pneumothorax, commonly called a collapsed lung, is a condition in which air leaks from a lung and gets trapped between the lung and the chest wall (pleural space). The buildup of air may be small or large. A small pneumothorax may go away on its own. A large pneumothorax will require treatment and hospitalization. Treatment for this condition depends on how severe the pneumothorax is. The goal of treatment is to remove the extra air and allow the lung to expand back to its normal size. Get help right away if you have increasing chest pain or shortness of breath. This information is not intended to replace advice given to you by your health care  provider. Make sure you discuss any questions you have with your health care provider. Document Revised: 07/25/2020 Document Reviewed: 07/25/2020 Elsevier Patient Education  2024 [employer].

## 2023-03-10 NOTE — Progress Notes (Signed)
 MEDICARE ANNUAL WELLNESS VISIT AND FOLLOW UP Assessment:   Encounter for Medicare annual wellness exam 1 year Last colonoscopy 2009  Essential hypertension - continue medications- Amlodipine  2.5 mg every day, Furosemide  40 mg every day   - Continue DASH diet, exercise and monitor at home. Call if greater than 130/80.  -     CBC with Differential/Platelet -     COMPLETE METABOLIC PANEL WITH GFR  OSA on CPAP Not using CPAP Requesting a new sleep study Referred back to [facility]  Mixed hyperlipidemia, statin intolerance Continue Lopid  600 mg BID, Zetia  10 mg every day  decrease fatty foods increase activity.  -     Lipid panel  Abnormal glucose Discussed disease progression and risks Discussed diet/exercise, weight management and risk modification  Edema of lower legs- Elevate legs above heart throughout the day Wear compression socks Continue Furosemide  40 mg every day   Vitamin D  deficiency Continue Vit D supplementation to maintain value in therapeutic level of 60-100   History of colonic polyps Declines follow up due to age  Idiopathic gout, unspecified chronicity, unspecified site Gout- recheck Uric acid as needed Last check 11/09/22 4.9 normal Continue allopurinol  300 mg every day   Gastroesophageal reflux disease with esophagitis Initiated medication for new reflux related symptoms: famotidine  20 mg BID Discussed diet, avoiding triggers and other lifestyle changes Follow up if not improving - Magnesium  Insomnia Continue Gabapentin  300 mg 1-2 caps prior to bedtime Monitor closely   Neuropathy Monitor symptoms Will try Gabapentin  300 mg 1-2 tabs QHS   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       Future Appointments  Date Time Provider Department Center  03/16/2023 11:20 AM Emelia Josefa HERO, NP [facility] None  06/24/2023 10:00 AM [name], MD [facility] None  09/27/2023  3:30 PM Brady Schiller E, NP [facility] None  03/13/2024  3:00 PM Beverely Suen E, NP [facility] None     Plan:   During the course of the visit the patient was educated and counseled about appropriate screening and preventive services including:   Pneumococcal vaccine  Influenza vaccine Td vaccine Screening electrocardiogram Colorectal cancer screening Diabetes screening Glaucoma screening Nutrition counseling    Subjective:  Jeremy Boone is a AA 89 y.o. male who presents for Medicare Annual Wellness Visit and 3 month follow up for HTN, hyperlipidemia, abnormal glucose, OSA w CPAP, GERD, gout and vitamin D  Def.   Wife has dementia, he is primary caregiver. Goes to [employer] during the day.   His CPAP  was taken because he was not using consistently.  He has been noticing more difficulty sleeping and would like another sleep study as he believes his breathing is more difficult.  Previously done through [facility] . He does use Gabapentin  300 mg 1-2 caps as needed for sleep.   He has been noticing that he does have occasional burning on urination. Does have urge incontinence and does use oxybutynin .   He reports GERD symptoms are currently controlled with Famotidine .   He was having urinary urgency and mild incontinence, reports improved with  oxybutynin  but does not take regularly.  Lab Results  Component Value Date   PSA 2.88 04/21/2022   PSA 3.47 11/06/2021   PSA 2.92 03/27/2021    BMI is Body mass index is 24.7 kg/m., he has lost 9 pounds in the past 9 years, he does usually skip lunch.  Wt Readings from Last 3 Encounters:  03/11/23 179 lb 9.6 oz (81.5 kg)  02/17/23 176 lb 6.4 oz (80 kg)  01/27/23 173 lb (78.5 kg)   His blood  pressure has been controlled at home with Amlodipine  2.5 mg every day , today their BP is BP: 130/74,  BP Readings from Last 3 Encounters:  03/11/23 130/74  02/17/23 124/78  02/03/23 (!) 167/90  He does workout. He denies chest pain, dyspnea, dizziness.    He is on cholesterol medication, Zetia  10 mg and Lopid  1200 once daily due to intolerance of statins due to elevated CPK/aldolase and denies myalgias. His cholesterol is not at goal. The cholesterol last visit was:   Lab Results  Component Value Date   CHOL 155 12/26/2022   HDL 74 12/26/2022   LDLCALC 68 12/26/2022   TRIG 67 12/26/2022   CHOLHDL 2.1 12/26/2022   He has been working on diet and exercise for abnormal glucose. Last A1C in the office was:  Lab Results  Component Value Date   HGBA1C 6.4 (H) 11/09/2022    He is trying to drink more water.  Lab Results  Component Value Date   EGFR 69 11/09/2022    Patient is on Vitamin D  supplement.   Lab Results  Component Value Date   VD25OH 51 11/09/2022    Patient is on allopurinol  (150 mg only) for gout and does not report a recent flare. Lab Results  Component Value Date   LABURIC 4.9 11/09/2022    Lab Results  Component Value Date   MICRALBCREAT NOTE 04/21/2022          Medication Review: Current Outpatient Medications on File Prior to Visit  Medication Sig Dispense Refill   allopurinol  (ZYLOPRIM ) 300 MG tablet Take  1 tablet  Daily for For Gout Prevention                                                                /                                                                   TAKE                                         BY                                                 MOUTH 90 tablet 3   amLODipine  (NORVASC ) 2.5 MG tablet Take 1  tablet (2.5 mg total) by mouth daily. 30 tablet 1   aspirin  EC 81 MG tablet Take 1 tablet (81 mg total) by mouth daily. Swallow whole. 30 tablet 2   Cholecalciferol (VITAMIN D  PO) Take 5,000 Int'l Units by mouth daily.      ezetimibe  (ZETIA ) 10 MG tablet TAKE 1 TABLET BY MOUTH DAILY FOR CHOLESTEROL 100 tablet 2   famotidine  (PEPCID ) 20 MG tablet TAKE 1 TABLET BY MOUTH TWICE  DAILY BEFORE BREAKFAST AND  SUPPER TO PREVENT HEARTBURN AND  INDIGESTION 200 tablet 2   furosemide  (LASIX ) 40 MG tablet Take 1 tablet Daily as needed for  Fluid Retention / Ankle Swelling 90 tablet 3   gabapentin  (NEURONTIN ) 300 MG capsule TAKE 1 TO 2 CAPSULES BY MOUTH 1  TO 2 HOURS BEFORE BEDTIME AS  NEEDED FOR SLEEP 200 capsule 2   gemfibrozil  (LOPID ) 600 MG tablet TAKE 1 TABLET BY MOUTH TWICE  DAILY WITH MEALS FOR CHOLESTEROL 200 tablet 2   oxybutynin  (DITROPAN -XL) 10 MG 24 hr tablet Take  1 tablet Daily for Bladder                                                                 /                                                     TAKE                       BY                                                 MOUTH (Patient taking differently: Take 10 mg by mouth daily as needed (retention). Take  1 tablet Daily for Bladder                                                                 /                                                     TAKE                       BY                                                 MOUTH) 90 tablet 3   triamcinolone  (NASACORT ) 55 MCG/ACT AERO nasal inhaler Place 2 sprays into the  nose at bedtime. (Patient taking differently: Place 2 sprays into the nose daily as needed (allergies).) 3 each 1   No current facility-administered medications on file prior to visit.    Current Problems (verified) Patient Active Problem List   Diagnosis Date Noted   Spontaneous pneumothorax 12/24/2022   Demand ischemia (HCC) 12/24/2022   Alcohol use 12/24/2022   Insomnia 06/27/2020   Statin myopathy 11/21/2019   Constipation 12/15/2017   OSA on CPAP 04/19/2015   Vitamin D  deficiency 09/14/2013   Medication management 09/14/2013   History of colonic polyps 08/02/2013   Gastroesophageal reflux disease without esophagitis  08/02/2013   Hyperlipidemia, mixed    Essential hypertension    Idiopathic gout    Abnormal glucose     Screening Tests Immunization History  Administered Date(s) Administered   Influenza, High Dose Seasonal PF 12/18/2013, 10/28/2015, 12/10/2016, 01/26/2018, 11/10/2018, 12/24/2020, 01/14/2022   Influenza,inj,Quad PF,6+ Mos 03/07/2013   Influenza-Unspecified 12/01/2022   Moderna Sars-Covid-2 Vaccination 03/14/2019, 04/11/2019, 02/04/2020   PNEUMOCOCCAL CONJUGATE-20 07/01/2021   Pneumococcal Conjugate-13 03/28/2014   Pneumococcal-Unspecified 03/03/2007   Td 03/02/2004   Tdap 10/25/2014   Unspecified SARS-COV-2 Vaccination 12/01/2022   Zoster, Live 08/26/2016   Health Maintenance  Topic Date Due   COVID-19 Vaccine (5 - 2024-25 season) 03/27/2023 (Originally 01/26/2023)   Zoster Vaccines- Shingrix (1 of 2) 06/09/2023 (Originally 03/19/1953)   Medicare Annual Wellness (AWV)  03/10/2024   DTaP/Tdap/Td (3 - Td or Tdap) 10/24/2024   Pneumonia Vaccine 63+ Years old  Completed   INFLUENZA VACCINE  Completed   HPV VACCINES  Aged Out     Names of Other Physician/Practitioners you currently use: 1. [facility] here for primary care 2. Last eye: upcoming appointment 08/03/22 Dr. Regenia 3. Last dental, Dr. Terrye 08/2022  Patient Care Team: [name], MD as PCP - General (Internal Medicine) Carrie Dunnings, MD as Consulting Physician (Ophthalmology) Aplington, Lynwood SQUIBB, MD (Inactive) as Consulting Physician (Orthopedic Surgery) Debrah Lamar BIRCH, MD (Inactive) as Consulting Physician (Gastroenterology) Delon George as Physician Assistant (Sleep Medicine)   History reviewed: allergies, current medications, past family history, past medical history, past social history, past surgical history and problem list  Allergies Allergies  Allergen Reactions   Ace Inhibitors Cough   Lipitor [Atorvastatin]     Elevates CPK and Aldolase     SURGICAL HISTORY He  has a past surgical history that includes Carpal tunnel release (Left, 2007); Rotator cuff repair (Left, 2000); Other surgical history (2007); Cardiac Event Monitor (May-June 2017); and Cataract extraction, bilateral. FAMILY HISTORY His family history includes Hypertension in his mother; Seizures in his sister; Stroke in his father. SOCIAL HISTORY He  reports that he has never smoked. He has never used smokeless tobacco. He reports current alcohol use of about 15.0 standard drinks of alcohol per week. He reports that he does not use drugs.  MEDICARE WELLNESS OBJECTIVES: Physical activity:   Cardiac risk factors: Cardiac Risk Factors include: advanced age (>84men, >25 women);dyslipidemia;hypertension;sedentary lifestyle Depression/mood screen:      03/11/2023    4:02 PM  Depression screen PHQ 2/9  Decreased Interest 0  Down, Depressed, Hopeless 0  PHQ - 2 Score 0    ADLs:     03/11/2023    3:59 PM 12/24/2022    7:28 PM  In your present state of health, do you have any difficulty performing the following activities:  Hearing? 0 1  Vision? 0 1  Difficulty concentrating or making decisions? 1 1  Walking or climbing stairs? 0   Dressing or bathing? 0   Doing errands, shopping? 0 0  Preparing Food and eating ? N   Using the Toilet? N   In the past six months, have you accidently leaked urine? N   Do you have problems with loss of bowel control? N   Managing your Medications? N   Managing your Finances? N   Housekeeping or managing your Housekeeping? N      Cognitive Testing  Alert? Yes  Normal Appearance?Yes  Oriented to person? Yes  Place? Yes   Time? Yes  Recall of three objects?  Yes  Can perform simple calculations? Yes  Displays appropriate judgment?Yes  Can read the correct time from a watch face?Yes  EOL planning: Does Patient Have a Medical Advance Directive?: Yes Type of Advance Directive: Healthcare Power of Attorney, Living will Does  patient want to make changes to medical advance directive?: No - Patient declined Copy of Healthcare Power of Attorney in Chart?: No - copy requested   Objective:   Blood pressure 130/74, pulse 90, temperature 97.9 F (36.6 C), resp. rate 17, height 5' 11.5 (1.816 m), weight 179 lb 9.6 oz (81.5 kg). Body mass index is 24.7 kg/m.  General appearance: alert, no distress, WD/WN, male HEENT: normocephalic, sclerae anicteric, TMs pearly, nares patent, no discharge or erythema, pharynx normal Oral cavity: MMM, no lesions Neck: supple, no lymphadenopathy, no thyromegaly, no masses Heart: RRR, normal S1, S2, 2/6 rumbling early systolic murmur without radiation Lungs:Regular with expiratory wheezes upper lobes that clear with cough Abdomen: +bs, soft, mild epigastric tenderness, non distended, no masses, no hepatomegaly, no splenomegaly Musculoskeletal: nontender, no swelling, no obvious deformity Extremities: no edema, no cyanosis, no clubbing Pulses: 2+ symmetric, upper and lower extremities, normal cap refill Neurological: alert, oriented x 3, CN2-12 intact, strength normal upper extremities and lower extremities, sensation normal throughout except left leg decrease sensation of foot, DTRs 2+ throughout, no cerebellar signs, gait antalgic with cane Psychiatric: normal affect, behavior normal, pleasant   Medicare Attestation I have personally reviewed: The patient's medical and social history Their use of alcohol, tobacco or illicit drugs Their current medications and supplements The patient's functional ability including ADLs,fall risks, home safety risks, cognitive, and hearing and visual impairment Diet and physical activities Evidence for depression or mood disorders  The patient's weight, height, BMI, and visual acuity have been recorded in the chart.  I have made referrals, counseling, and provided education to the patient based on review of the above and I have provided the patient  with a written personalized care plan for preventive services.     Alesia Oshields E Lennyn Bellanca, NP   03/11/2023

## 2023-03-11 ENCOUNTER — Ambulatory Visit (INDEPENDENT_AMBULATORY_CARE_PROVIDER_SITE_OTHER): Payer: Medicare Other | Admitting: Nurse Practitioner

## 2023-03-11 ENCOUNTER — Encounter: Payer: Self-pay | Admitting: Nurse Practitioner

## 2023-03-11 VITALS — BP 130/74 | HR 90 | Temp 97.9°F | Resp 17 | Ht 71.5 in | Wt 179.6 lb

## 2023-03-11 DIAGNOSIS — G720 Drug-induced myopathy: Secondary | ICD-10-CM

## 2023-03-11 DIAGNOSIS — G4733 Obstructive sleep apnea (adult) (pediatric): Secondary | ICD-10-CM

## 2023-03-11 DIAGNOSIS — I10 Essential (primary) hypertension: Secondary | ICD-10-CM | POA: Diagnosis not present

## 2023-03-11 DIAGNOSIS — G629 Polyneuropathy, unspecified: Secondary | ICD-10-CM | POA: Diagnosis not present

## 2023-03-11 DIAGNOSIS — R6889 Other general symptoms and signs: Secondary | ICD-10-CM | POA: Diagnosis not present

## 2023-03-11 DIAGNOSIS — E782 Mixed hyperlipidemia: Secondary | ICD-10-CM | POA: Diagnosis not present

## 2023-03-11 DIAGNOSIS — R7309 Other abnormal glucose: Secondary | ICD-10-CM

## 2023-03-11 DIAGNOSIS — Z0000 Encounter for general adult medical examination without abnormal findings: Secondary | ICD-10-CM

## 2023-03-11 DIAGNOSIS — E559 Vitamin D deficiency, unspecified: Secondary | ICD-10-CM

## 2023-03-11 DIAGNOSIS — I872 Venous insufficiency (chronic) (peripheral): Secondary | ICD-10-CM | POA: Diagnosis not present

## 2023-03-11 DIAGNOSIS — K219 Gastro-esophageal reflux disease without esophagitis: Secondary | ICD-10-CM | POA: Diagnosis not present

## 2023-03-11 DIAGNOSIS — N138 Other obstructive and reflux uropathy: Secondary | ICD-10-CM

## 2023-03-11 DIAGNOSIS — Z0001 Encounter for general adult medical examination with abnormal findings: Secondary | ICD-10-CM

## 2023-03-11 DIAGNOSIS — G4700 Insomnia, unspecified: Secondary | ICD-10-CM

## 2023-03-11 DIAGNOSIS — M1000 Idiopathic gout, unspecified site: Secondary | ICD-10-CM | POA: Diagnosis not present

## 2023-03-11 DIAGNOSIS — Z79899 Other long term (current) drug therapy: Secondary | ICD-10-CM | POA: Diagnosis not present

## 2023-03-11 DIAGNOSIS — N401 Enlarged prostate with lower urinary tract symptoms: Secondary | ICD-10-CM

## 2023-03-11 MED ORDER — VITAMIN B-1 100 MG PO TABS
100.0000 mg | ORAL_TABLET | Freq: Every day | ORAL | 1 refills | Status: AC
Start: 2023-03-11 — End: ?

## 2023-03-11 NOTE — Patient Instructions (Signed)

## 2023-03-12 LAB — CBC WITH DIFFERENTIAL/PLATELET
Absolute Lymphocytes: 2814 {cells}/uL (ref 850–3900)
Absolute Monocytes: 608 {cells}/uL (ref 200–950)
Basophils Absolute: 53 {cells}/uL (ref 0–200)
Basophils Relative: 0.9 %
Eosinophils Absolute: 484 {cells}/uL (ref 15–500)
Eosinophils Relative: 8.2 %
HCT: 44.8 % (ref 38.5–50.0)
Hemoglobin: 14.6 g/dL (ref 13.2–17.1)
MCH: 30.1 pg (ref 27.0–33.0)
MCHC: 32.6 g/dL (ref 32.0–36.0)
MCV: 92.4 fL (ref 80.0–100.0)
MPV: 10.5 fL (ref 7.5–12.5)
Monocytes Relative: 10.3 %
Neutro Abs: 1941 {cells}/uL (ref 1500–7800)
Neutrophils Relative %: 32.9 %
Platelets: 283 10*3/uL (ref 140–400)
RBC: 4.85 10*6/uL (ref 4.20–5.80)
RDW: 12.6 % (ref 11.0–15.0)
Total Lymphocyte: 47.7 %
WBC: 5.9 10*3/uL (ref 3.8–10.8)

## 2023-03-12 LAB — COMPLETE METABOLIC PANEL WITH GFR
AG Ratio: 1.2 (calc) (ref 1.0–2.5)
ALT: 10 U/L (ref 9–46)
AST: 20 U/L (ref 10–35)
Albumin: 4.4 g/dL (ref 3.6–5.1)
Alkaline phosphatase (APISO): 73 U/L (ref 35–144)
BUN: 14 mg/dL (ref 7–25)
CO2: 28 mmol/L (ref 20–32)
Calcium: 9.7 mg/dL (ref 8.6–10.3)
Chloride: 101 mmol/L (ref 98–110)
Creat: 0.96 mg/dL (ref 0.70–1.22)
Globulin: 3.7 g/dL (ref 1.9–3.7)
Glucose, Bld: 64 mg/dL — ABNORMAL LOW (ref 65–99)
Potassium: 4.4 mmol/L (ref 3.5–5.3)
Sodium: 139 mmol/L (ref 135–146)
Total Bilirubin: 0.3 mg/dL (ref 0.2–1.2)
Total Protein: 8.1 g/dL (ref 6.1–8.1)
eGFR: 77 mL/min/{1.73_m2} (ref >=60)

## 2023-03-12 LAB — LIPID PANEL
Cholesterol: 212 mg/dL — ABNORMAL HIGH (ref <200)
HDL: 104 mg/dL (ref >=40)
LDL Cholesterol (Calc): 92 mg/dL
Non-HDL Cholesterol (Calc): 108 mg/dL (ref <130)
Total CHOL/HDL Ratio: 2 (calc) (ref <5.0)
Triglycerides: 75 mg/dL (ref <150)

## 2023-03-12 LAB — MAGNESIUM: Magnesium: 2.2 mg/dL (ref 1.5–2.5)

## 2023-03-12 LAB — TSH: TSH: 2.67 m[IU]/L (ref 0.40–4.50)

## 2023-03-15 NOTE — Progress Notes (Deleted)
 Cardiology Clinic Note   Patient Name: Jeremy Boone Date of Encounter: 03/15/2023  Primary Care Provider:  [name], MD Primary Cardiologist:  None  Patient Profile    Jeremy Boone 89 year old male presents to the clinic today for follow-up evaluation of his chest discomfort.  Past Medical History    Past Medical History:  Diagnosis Date   Gout    Hyperlipidemia    Hypertension    Prediabetes    Past Surgical History:  Procedure Laterality Date   Cardiac Event Monitor  May-June 2017   Sinus rhythm with average heart rate 74.2 - no notable arrhythmias. Minimal PVCs. No PACs.   CARPAL TUNNEL RELEASE Left 2007   CATARACT EXTRACTION, BILATERAL     OTHER SURGICAL HISTORY  2007   Negative biopsy tonsil mass   ROTATOR CUFF REPAIR Left 2000    Allergies  Allergies  Allergen Reactions   Ace Inhibitors Cough   Lipitor [Atorvastatin]     Elevates CPK and Aldolase    History of Present Illness    Jeremy Boone has a PMH of hypertension, hyperlipidemia, and OSA.  He was admitted on 12/24/2022 and discharged on 12/26/2022.  He was diagnosed with right sided spontaneous pneumothorax.  Cardiology was consulted for evaluation of elevated troponin.  Dr. Burton saw and examined the patient.  Patient received chest tube placement and his symptoms improved.  His cardiac troponins were noted to be minimally elevated.  His EKG showed LVH.  It was felt that his symptoms were related to his pneumothorax.  It was felt that his increase in cardiac troponins was due to/secondary to demand ischemia.  He was started on aspirin .  He reported a statin allergy.  His ezetimibe  was continued.  Echocardiogram at that time showed no regional wall motion abnormalities, mild aortic stenosis, and follow-up was planned in 4 to 6 weeks.  He presents to the clinic today for follow-up evaluation and states***.  *** denies chest pain, shortness of breath, lower extremity edema, fatigue,  palpitations, melena, hematuria, hemoptysis, diaphoresis, weakness, presyncope, syncope, orthopnea, and PND.  Elevated cardiac troponin-denies chest pain.  Noted to have elevated cardiac troponins in the setting of right sided spontaneous pneumothorax.  EKG showed LVH.  Echocardiogram was unremarkable.  There was no concern for ACS. Continue aspirin  Heart healthy low-sodium diet Increase physical activity as tolerated  Cardiac murmur-3/6 systolic murmur heard along***.  Echocardiogram 12/25/2022 showed trivial mitral valve regurgitation, moderate calcification of the aortic valve with trivial aortic valve regurgitation and mild aortic valve stenosis.  Essential hypertension-BP today***. Maintain blood pressure log Continue amlodipine   Hyperlipidemia-LDL***.  Statin allergy/intolerant. Continue ezetimibe  Follows with PCP  Disposition: Follow-up with Dr. Barbaraann or me in 6 months.  Home Medications    Prior to Admission medications   Medication Sig Start Date End Date Taking? Authorizing Provider  allopurinol  (ZYLOPRIM ) 300 MG tablet Take  1 tablet  Daily for For Gout Prevention                                                                /  TAKE                                         BY                                                 MOUTH 10/24/22   [name], MD  amLODipine  (NORVASC ) 2.5 MG tablet Take 1 tablet (2.5 mg total) by mouth daily. 12/27/22   Krishnan, Gokul, MD  aspirin  EC 81 MG tablet Take 1 tablet (81 mg total) by mouth daily. Swallow whole. 12/26/22 03/26/23  Krishnan, Gokul, MD  Cholecalciferol (VITAMIN D  PO) Take 5,000 Int'l Units by mouth daily.    [provider]  ezetimibe  (ZETIA ) 10 MG tablet TAKE 1 TABLET BY MOUTH DAILY FOR CHOLESTEROL 02/02/23   Wilkinson, Dana E, NP  famotidine  (PEPCID ) 20 MG tablet TAKE 1 TABLET BY MOUTH TWICE  DAILY BEFORE BREAKFAST AND  SUPPER TO PREVENT HEARTBURN  AND  INDIGESTION 01/11/23   Wilkinson, Dana E, NP  furosemide  (LASIX ) 40 MG tablet Take 1 tablet Daily as needed for  Fluid Retention / Ankle Swelling 11/09/22   [name], MD  gabapentin  (NEURONTIN ) 300 MG capsule TAKE 1 TO 2 CAPSULES BY MOUTH 1  TO 2 HOURS BEFORE BEDTIME AS  NEEDED FOR SLEEP 12/08/22   Cranford, Tonya, NP  gemfibrozil  (LOPID ) 600 MG tablet TAKE 1 TABLET BY MOUTH TWICE  DAILY WITH MEALS FOR CHOLESTEROL 07/21/22   Wilkinson, Dana E, NP  oxybutynin  (DITROPAN -XL) 10 MG 24 hr tablet Take  1 tablet Daily for Bladder                                                                 /                                                     TAKE                       BY                                                 MOUTH Patient taking differently: Take 10 mg by mouth daily as needed (retention). Take  1 tablet Daily for Bladder                                                                 /  TAKE                       BY                                                 MOUTH 12/14/22   [name], MD  thiamine  (VITAMIN B-1) 100 MG tablet Take 1 tablet (100 mg total) by mouth daily. 03/11/23   Wilkinson, Dana E, NP  triamcinolone  (NASACORT ) 55 MCG/ACT AERO nasal inhaler Place 2 sprays into the nose at bedtime. Patient taking differently: Place 2 sprays into the nose daily as needed (allergies). 07/01/21 12/31/22  Jude Lonell BRAVO, NP    Family History    Family History  Problem Relation Age of Onset   Hypertension Mother    Stroke Father    Seizures Sister    Colon cancer Neg Hx    Stomach cancer Neg Hx    Esophageal cancer Neg Hx    Inflammatory bowel disease Neg Hx    Liver disease Neg Hx    Pancreatic cancer Neg Hx    Rectal cancer Neg Hx    He indicated that his mother is deceased. He indicated that his father is deceased. He indicated that the status of his sister is unknown. He indicated that his son is alive. He  indicated that the status of his neg hx is unknown.  Social History    Social History   Socioeconomic History   Marital status: Married    Spouse name: Not on file   Number of children: Not on file   Years of education: Not on file   Highest education level: Not on file  Occupational History   Not on file  Tobacco Use   Smoking status: Never   Smokeless tobacco: Never  Vaping Use   Vaping status: Never Used  Substance and Sexual Activity   Alcohol use: Yes    Alcohol/week: 15.0 standard drinks of alcohol    Types: 15 Standard drinks or equivalent per week    Comment: every day 2-3 drimks a day    Drug use: No   Sexual activity: Not on file  Other Topics Concern   Not on file  Social History Narrative   Not on file   Social Drivers of Health   Financial Resource Strain: Not on file  Food Insecurity: No Food Insecurity (12/24/2022)   Hunger Vital Sign    Worried About Running Out of Food in the Last Year: Never true    Ran Out of Food in the Last Year: Never true  Transportation Needs: No Transportation Needs (12/24/2022)   PRAPARE - Administrator, Civil Service (Medical): No    Lack of Transportation (Non-Medical): No  Physical Activity: Not on file  Stress: Not on file  Social Connections: Not on file  Intimate Partner Violence: Not At Risk (12/24/2022)   Humiliation, Afraid, Rape, and Kick questionnaire    Fear of Current or Ex-Partner: No    Emotionally Abused: No    Physically Abused: No    Sexually Abused: No     Review of Systems    General:  No chills, fever, night sweats or weight changes.  Cardiovascular:  No chest pain, dyspnea on exertion, edema, orthopnea, palpitations, paroxysmal nocturnal dyspnea. Dermatological: No rash, lesions/masses Respiratory: No cough, dyspnea Urologic:  No hematuria, dysuria Abdominal:   No nausea, vomiting, diarrhea, bright red blood per rectum, melena, or hematemesis Neurologic:  No visual changes, wkns,  changes in mental status. All other systems reviewed and are otherwise negative except as noted above.  Physical Exam    VS:  There were no vitals taken for this visit. , BMI There is no height or weight on file to calculate BMI. GEN: Well nourished, well developed, in no acute distress. HEENT: normal. Neck: Supple, no JVD, carotid bruits, or masses. Cardiac: RRR, no murmurs, rubs, or gallops. No clubbing, cyanosis, edema.  Radials/DP/PT 2+ and equal bilaterally.  Respiratory:  Respirations regular and unlabored, clear to auscultation bilaterally. GI: Soft, nontender, nondistended, BS + x 4. MS: no deformity or atrophy. Skin: warm and dry, no rash. Neuro:  Strength and sensation are intact. Psych: Normal affect.  Accessory Clinical Findings    Recent Labs: 12/24/2022: B Natriuretic Peptide 21.8 03/11/2023: ALT 10; BUN 14; Creat 0.96; Hemoglobin 14.6; Magnesium 2.2; Platelets 283; Potassium 4.4; Sodium 139; TSH 2.67   Recent Lipid Panel    Component Value Date/Time   CHOL 212 (H) 03/11/2023 1621   TRIG 75 03/11/2023 1621   HDL 104 03/11/2023 1621   CHOLHDL 2.0 03/11/2023 1621   VLDL 13 12/26/2022 0230   LDLCALC 92 03/11/2023 1621    No BP recorded.  {Refresh Note OR Click here to enter BP  :1}***    ECG personally reviewed by me today- ***     Echocardiogram 12/25/2022  IMPRESSIONS     1. Left ventricular ejection fraction, by estimation, is >75%. The left  ventricle has hyperdynamic function. The left ventricle has no regional  wall motion abnormalities. There is mild left ventricular hypertrophy of  the basal-septal segment. Left  ventricular diastolic parameters are indeterminate.   2. Right ventricular systolic function is normal. The right ventricular  size is normal. There is mildly elevated pulmonary artery systolic  pressure. The estimated right ventricular systolic pressure is 37.3 mmHg.   3. The mitral valve is degenerative. Trivial mitral valve  regurgitation.  No evidence of mitral stenosis.   4. The aortic valve is tricuspid. There is moderate calcification of the  aortic valve. There is moderate thickening of the aortic valve. Aortic  valve regurgitation is trivial. Mild aortic valve stenosis. Aortic valve  area, by VTI measures 1.93 cm.  Aortic valve mean gradient measures 14.0 mmHg. Aortic valve Vmax measures  2.63 m/s.   Comparison(s): No prior Echocardiogram.   FINDINGS   Left Ventricle: Left ventricular ejection fraction, by estimation, is  >75%. The left ventricle has hyperdynamic function. The left ventricle has  no regional wall motion abnormalities. The left ventricular internal  cavity size was normal in size. There  is mild left ventricular hypertrophy of the basal-septal segment. Left  ventricular diastolic parameters are indeterminate. Normal left  ventricular filling pressure.   Right Ventricle: The right ventricular size is normal. No increase in  right ventricular wall thickness. Right ventricular systolic function is  normal. There is mildly elevated pulmonary artery systolic pressure. The  tricuspid regurgitant velocity is 2.93   m/s, and with an assumed right atrial pressure of 3 mmHg, the estimated  right ventricular systolic pressure is 37.3 mmHg.   Left Atrium: Left atrial size was normal in size.   Right Atrium: Right atrial size was normal in size.   Pericardium: There is no evidence of pericardial effusion.   Mitral Valve: The mitral valve is degenerative in  appearance. There is  mild thickening of the mitral valve leaflet(s). There is mild  calcification of the mitral valve leaflet(s). Normal mobility of the  mitral valve leaflets. Mild mitral annular  calcification. Trivial mitral valve regurgitation. No evidence of mitral  valve stenosis.   Tricuspid Valve: The tricuspid valve is normal in structure. Tricuspid  valve regurgitation is trivial. No evidence of tricuspid stenosis.    Aortic Valve: The aortic valve is tricuspid. There is moderate  calcification of the aortic valve. There is moderate thickening of the  aortic valve. There is mild to moderate aortic valve annular  calcification. Aortic valve regurgitation is trivial. Mild  aortic stenosis is present. Aortic valve mean gradient measures 14.0 mmHg.  Aortic valve peak gradient measures 27.7 mmHg. Aortic valve area, by VTI  measures 1.93 cm.   Pulmonic Valve: The pulmonic valve was normal in structure. Pulmonic valve  regurgitation is trivial. No evidence of pulmonic stenosis.   Aorta: The aortic root and ascending aorta are structurally normal, with  no evidence of dilitation.   IAS/Shunts: There is redundancy of the interatrial septum. The atrial  septum is grossly normal.     Assessment & Plan   1.  ***   Josefa HERO. Azaria Bartell NP-C     03/15/2023, 7:29 AM [facility] [address] Office [phone] Fax [phone]    I spent***minutes examining this patient, reviewing medications, and using patient centered shared decision making involving their cardiac care.   I spent greater than 20 minutes reviewing their past medical history,  medications, and prior cardiac tests.

## 2023-03-16 ENCOUNTER — Emergency Department (HOSPITAL_COMMUNITY): Payer: Medicare Other

## 2023-03-16 ENCOUNTER — Ambulatory Visit: Payer: Medicare Other | Admitting: General Practice

## 2023-03-16 ENCOUNTER — Inpatient Hospital Stay (HOSPITAL_COMMUNITY)
Admission: EM | Admit: 2023-03-16 | Discharge: 2023-03-20 | DRG: 201 | Disposition: A | Discharge status: Home / Self Care | Payer: Medicare Other | Attending: Internal Medicine | Admitting: Internal Medicine

## 2023-03-16 DIAGNOSIS — K219 Gastro-esophageal reflux disease without esophagitis: Secondary | ICD-10-CM | POA: Diagnosis not present

## 2023-03-16 DIAGNOSIS — Z7982 Long term (current) use of aspirin: Secondary | ICD-10-CM

## 2023-03-16 DIAGNOSIS — G4700 Insomnia, unspecified: Secondary | ICD-10-CM | POA: Diagnosis present

## 2023-03-16 DIAGNOSIS — Z4682 Encounter for fitting and adjustment of non-vascular catheter: Secondary | ICD-10-CM | POA: Diagnosis not present

## 2023-03-16 DIAGNOSIS — R0602 Shortness of breath: Secondary | ICD-10-CM | POA: Diagnosis not present

## 2023-03-16 DIAGNOSIS — J9819 Other pulmonary collapse: Secondary | ICD-10-CM | POA: Diagnosis not present

## 2023-03-16 DIAGNOSIS — I10 Essential (primary) hypertension: Secondary | ICD-10-CM | POA: Diagnosis present

## 2023-03-16 DIAGNOSIS — N400 Enlarged prostate without lower urinary tract symptoms: Secondary | ICD-10-CM

## 2023-03-16 DIAGNOSIS — Z79899 Other long term (current) drug therapy: Secondary | ICD-10-CM | POA: Diagnosis not present

## 2023-03-16 DIAGNOSIS — E782 Mixed hyperlipidemia: Secondary | ICD-10-CM | POA: Diagnosis not present

## 2023-03-16 DIAGNOSIS — Z823 Family history of stroke: Secondary | ICD-10-CM | POA: Diagnosis not present

## 2023-03-16 DIAGNOSIS — R0902 Hypoxemia: Secondary | ICD-10-CM | POA: Diagnosis not present

## 2023-03-16 DIAGNOSIS — Z888 Allergy status to other drugs, medicaments and biological substances status: Secondary | ICD-10-CM

## 2023-03-16 DIAGNOSIS — Z9841 Cataract extraction status, right eye: Secondary | ICD-10-CM | POA: Diagnosis not present

## 2023-03-16 DIAGNOSIS — G4733 Obstructive sleep apnea (adult) (pediatric): Secondary | ICD-10-CM

## 2023-03-16 DIAGNOSIS — R6889 Other general symptoms and signs: Secondary | ICD-10-CM | POA: Diagnosis not present

## 2023-03-16 DIAGNOSIS — R918 Other nonspecific abnormal finding of lung field: Secondary | ICD-10-CM | POA: Diagnosis not present

## 2023-03-16 DIAGNOSIS — J939 Pneumothorax, unspecified: Secondary | ICD-10-CM | POA: Diagnosis not present

## 2023-03-16 DIAGNOSIS — Z743 Need for continuous supervision: Secondary | ICD-10-CM | POA: Diagnosis not present

## 2023-03-16 DIAGNOSIS — M1000 Idiopathic gout, unspecified site: Secondary | ICD-10-CM | POA: Diagnosis present

## 2023-03-16 DIAGNOSIS — Z9842 Cataract extraction status, left eye: Secondary | ICD-10-CM | POA: Diagnosis not present

## 2023-03-16 DIAGNOSIS — Z789 Other specified health status: Secondary | ICD-10-CM | POA: Diagnosis present

## 2023-03-16 DIAGNOSIS — J9383 Other pneumothorax: Principal | ICD-10-CM | POA: Diagnosis present

## 2023-03-16 DIAGNOSIS — R0789 Other chest pain: Secondary | ICD-10-CM | POA: Diagnosis not present

## 2023-03-16 DIAGNOSIS — Z8249 Family history of ischemic heart disease and other diseases of the circulatory system: Secondary | ICD-10-CM

## 2023-03-16 DIAGNOSIS — F1090 Alcohol use, unspecified, uncomplicated: Secondary | ICD-10-CM | POA: Diagnosis present

## 2023-03-16 DIAGNOSIS — R079 Chest pain, unspecified: Secondary | ICD-10-CM | POA: Diagnosis not present

## 2023-03-16 LAB — CBC WITH DIFFERENTIAL/PLATELET
Abs Immature Granulocytes: 0.01 10*3/uL (ref 0.00–0.07)
Basophils Absolute: 0.1 10*3/uL (ref 0.0–0.1)
Basophils Relative: 1 %
Eosinophils Absolute: 0.3 10*3/uL (ref 0.0–0.5)
Eosinophils Relative: 6 %
HCT: 45.4 % (ref 39.0–52.0)
Hemoglobin: 14.9 g/dL (ref 13.0–17.0)
Immature Granulocytes: 0 %
Lymphocytes Relative: 44 %
Lymphs Abs: 2.3 10*3/uL (ref 0.7–4.0)
MCH: 30.5 pg (ref 26.0–34.0)
MCHC: 32.8 g/dL (ref 30.0–36.0)
MCV: 93 fL (ref 80.0–100.0)
Monocytes Absolute: 0.5 10*3/uL (ref 0.1–1.0)
Monocytes Relative: 9 %
Neutro Abs: 2.1 10*3/uL (ref 1.7–7.7)
Neutrophils Relative %: 40 %
Platelets: 274 10*3/uL (ref 150–400)
RBC: 4.88 MIL/uL (ref 4.22–5.81)
RDW: 13.8 % (ref 11.5–15.5)
WBC: 5.2 10*3/uL (ref 4.0–10.5)
nRBC: 0 % (ref 0.0–0.2)

## 2023-03-16 LAB — TROPONIN I (HIGH SENSITIVITY)
Troponin I (High Sensitivity): 9 ng/L (ref <18)
Troponin I (High Sensitivity): 10 ng/L (ref <18)

## 2023-03-16 LAB — RAPID URINE DRUG SCREEN, HOSP PERFORMED
Amphetamines: NOT DETECTED
Barbiturates: NOT DETECTED
Benzodiazepines: NOT DETECTED
Cocaine: NOT DETECTED
Opiates: NOT DETECTED
Tetrahydrocannabinol: NOT DETECTED

## 2023-03-16 LAB — BASIC METABOLIC PANEL
Anion gap: 11 (ref 5–15)
BUN: 15 mg/dL (ref 8–23)
CO2: 23 mmol/L (ref 22–32)
Calcium: 9.1 mg/dL (ref 8.9–10.3)
Chloride: 104 mmol/L (ref 98–111)
Creatinine, Ser: 0.96 mg/dL (ref 0.61–1.24)
GFR, Estimated: >60 mL/min (ref >60)
Glucose, Bld: 84 mg/dL (ref 70–99)
Potassium: 4.1 mmol/L (ref 3.5–5.1)
Sodium: 138 mmol/L (ref 135–145)

## 2023-03-16 LAB — BRAIN NATRIURETIC PEPTIDE: B Natriuretic Peptide: 19.5 pg/mL (ref 0.0–100.0)

## 2023-03-16 LAB — ETHANOL: Alcohol, Ethyl (B): <10 mg/dL (ref <10)

## 2023-03-16 MED ORDER — LORAZEPAM 2 MG/ML IJ SOLN: 1.0000 mg | INTRAMUSCULAR | Status: AC | PRN

## 2023-03-16 MED ORDER — FAMOTIDINE 20 MG PO TABS
20.0000 mg | ORAL_TABLET | Freq: Two times a day (BID) | ORAL | Status: DC
  Administered 2023-03-16 – 2023-03-20 (×8): 20 mg via ORAL
  Filled 2023-03-16 (×8): qty 1

## 2023-03-16 MED ORDER — FOLIC ACID 1 MG PO TABS
1.0000 mg | ORAL_TABLET | Freq: Every day | ORAL | Status: DC
  Administered 2023-03-17 – 2023-03-20 (×4): 1 mg via ORAL
  Filled 2023-03-16 (×4): qty 1

## 2023-03-16 MED ORDER — HYDROCODONE-ACETAMINOPHEN 5-325 MG PO TABS
1.0000 | ORAL_TABLET | Freq: Four times a day (QID) | ORAL | Status: DC | PRN
  Administered 2023-03-16: 1 via ORAL
  Administered 2023-03-17 – 2023-03-18 (×4): 2 via ORAL
  Administered 2023-03-18: 1 via ORAL
  Administered 2023-03-19 (×2): 2 via ORAL
  Filled 2023-03-16: qty 1
  Filled 2023-03-16: qty 2
  Filled 2023-03-16: qty 1
  Filled 2023-03-16 (×5): qty 2

## 2023-03-16 MED ORDER — EZETIMIBE 10 MG PO TABS
10.0000 mg | ORAL_TABLET | Freq: Every day | ORAL | Status: DC
Start: 2023-03-17 — End: 2023-03-20
  Administered 2023-03-17 – 2023-03-20 (×4): 10 mg via ORAL
  Filled 2023-03-16 (×4): qty 1

## 2023-03-16 MED ORDER — THIAMINE MONONITRATE 100 MG PO TABS
100.0000 mg | ORAL_TABLET | Freq: Every day | ORAL | Status: DC
  Administered 2023-03-17 – 2023-03-20 (×4): 100 mg via ORAL
  Filled 2023-03-16 (×4): qty 1

## 2023-03-16 MED ORDER — THIAMINE HCL 100 MG/ML IJ SOLN: 100.0000 mg | Freq: Every day | INTRAMUSCULAR | Status: DC

## 2023-03-16 MED ORDER — ACETAMINOPHEN 325 MG PO TABS
650.0000 mg | ORAL_TABLET | Freq: Four times a day (QID) | ORAL | Status: DC | PRN
  Administered 2023-03-18 – 2023-03-19 (×3): 650 mg via ORAL
  Filled 2023-03-16 (×4): qty 2

## 2023-03-16 MED ORDER — KETOROLAC TROMETHAMINE 15 MG/ML IJ SOLN
15.0000 mg | Freq: Once | INTRAMUSCULAR | Status: AC
  Administered 2023-03-16: 15 mg via INTRAVENOUS
  Filled 2023-03-16: qty 1

## 2023-03-16 MED ORDER — GABAPENTIN 300 MG PO CAPS
300.0000 mg | ORAL_CAPSULE | Freq: Every day | ORAL | Status: DC
  Administered 2023-03-16 – 2023-03-19 (×4): 300 mg via ORAL
  Filled 2023-03-16 (×4): qty 1

## 2023-03-16 MED ORDER — LORAZEPAM 1 MG PO TABS
1.0000 mg | ORAL_TABLET | ORAL | Status: AC | PRN
  Administered 2023-03-17 – 2023-03-18 (×2): 1 mg via ORAL
  Filled 2023-03-16 (×2): qty 1

## 2023-03-16 MED ORDER — ALLOPURINOL 300 MG PO TABS
150.0000 mg | ORAL_TABLET | Freq: Every day | ORAL | Status: DC
  Administered 2023-03-17 – 2023-03-20 (×4): 150 mg via ORAL
  Filled 2023-03-16 (×2): qty 1
  Filled 2023-03-16: qty 2
  Filled 2023-03-16: qty 1

## 2023-03-16 MED ORDER — GEMFIBROZIL 600 MG PO TABS
600.0000 mg | ORAL_TABLET | Freq: Two times a day (BID) | ORAL | Status: DC
  Administered 2023-03-16 – 2023-03-20 (×5): 600 mg via ORAL
  Filled 2023-03-16 (×9): qty 1

## 2023-03-16 MED ORDER — ADULT MULTIVITAMIN W/MINERALS CH
1.0000 | ORAL_TABLET | Freq: Every day | ORAL | Status: DC
  Administered 2023-03-17 – 2023-03-20 (×4): 1 via ORAL
  Filled 2023-03-16 (×4): qty 1

## 2023-03-16 MED ORDER — FENTANYL CITRATE PF 50 MCG/ML IJ SOSY
50.0000 ug | PREFILLED_SYRINGE | Freq: Once | INTRAMUSCULAR | Status: AC
  Administered 2023-03-16: 50 ug via INTRAVENOUS
  Filled 2023-03-16: qty 1

## 2023-03-16 MED ORDER — ACETAMINOPHEN 650 MG RE SUPP: 650.0000 mg | Freq: Four times a day (QID) | RECTAL | Status: DC | PRN

## 2023-03-16 NOTE — Assessment & Plan Note (Signed)
 Check ethanol level  Reports 2-3 beers/week CIWA protocol +nutriceuticals

## 2023-03-16 NOTE — Assessment & Plan Note (Signed)
 Continue allopurinol

## 2023-03-16 NOTE — ED Notes (Signed)
 Pt transported to xray

## 2023-03-16 NOTE — Assessment & Plan Note (Signed)
 Continue pepcid BID

## 2023-03-16 NOTE — Assessment & Plan Note (Signed)
 Continue gabapentin PRN at night

## 2023-03-16 NOTE — Assessment & Plan Note (Signed)
 89 year old presenting to ED with sudden onset shortness of breath and chest tightness last night found to have a spontaneous pneumothorax  -obs to progressive -s/p chest tube in ED and reexpansion, feeling better  -on supplemental oxygen -pulm consulted -second pneumothorax since 12/2022. Did have f/u with pulm  -no hx of smoking, COPD.  -did cough last night and wore CPAP  -repeat CXR shows resolution  -f/u on pulm recs

## 2023-03-16 NOTE — ED Provider Triage Note (Signed)
 Emergency Medicine Provider Triage Evaluation Note  Jeremy Boone , a 89 y.o. male  was evaluated in triage.  Pt complains of shortness of breath.  Review of Systems    Physical Exam  BP 134/81 (BP Location: Left Arm)   Pulse (!) 102   Resp 20   SpO2 92%  Gen:   Awake, no distress   Resp:  Normal effort, absent lung sounds on the right MSK:   Moves extremities without difficulty  Other:    Medical Decision Making  Medically screening exam initiated at 12:15 PM.  Appropriate orders placed.  Zackaria Burkey was informed that the remainder of the evaluation will be completed by another provider, this initial triage assessment does not replace that evaluation, and the importance of remaining in the ED until their evaluation is complete.  This patient has a pneumothorax seen on ultrasound, confirmed on chest x-ray.  Patient moved to room.   Mannie Pac T, DO 03/16/23 1216

## 2023-03-16 NOTE — Assessment & Plan Note (Addendum)
 Well controlled states he is no longer on novasc and reviewed home med list. Do not see this listed.  Monitor pressures

## 2023-03-16 NOTE — ED Triage Notes (Signed)
 Patient BIB [facility] from home for shob and chest pressure starting yesterday, patient initially 90% on RA, improved to 94% on 3L. Hx of pneumo a year ago, reports the pressure feels the same and has shob on exertion.

## 2023-03-16 NOTE — H&P (Signed)
 History and Physical    Patient: Jeremy Boone FMW:[id number] DOB: 12/10/1934 DOA: 03/16/2023 DOS: the patient was seen and examined on 03/16/2023 PCP: [name], MD  Patient coming from: Home - lives with his wife. Ambulates independently    Chief Complaint: shortness of breath and chest pressure   HPI: Jeremy Boone is a 89 y.o. male with medical history significant of HTN, HLD, gout,alcohol use, hx of spontaneous pneumothorax, OSA not on cpap who presented to ED with complaints of shortness of breath and chest pressure.  He had some coughing some last night  and some shortness of breath. He woke up in night with some chest pain, but when he would get up he would get worse with shortness of breath. His breathing became labored. His wife has dementia and he couldn't leave her at home so got her to her daycare. He got back home and checked his blood pressure as his chest pain over the right side got worse. His pressure was 122 systolic. He tried to go up the stairs and chest pain became worse and his shortness of breath was worse. He thought it felt similar to his previous spontaneous pneumothorax so called 911.   He has not been sick at all recently. Denies any fever/chills, vision changes/headaches, chest pain or palpitations, abdominal pain, N/V/D, dysuria or leg swelling.   He does not smoke and drinks alcohol. Couple beers/day. NO hx of smoking, but worked in [employer].   ER Course:  vitals: afebrile, bp: 134/81, HR: 102, RR: 20, oxygen: 87%RA Then 100% on 3L [place] Pertinent labs: none  CXR: recurrent large right-sided pneumothorax with near complete collapse of right lung. No mediastinal shift to suggest tension physiology at this time.  In ED: pulm consulted, chest tube placed. TRH asked to admit.     Review of Systems: As mentioned in the history of present illness. All other systems reviewed and are negative. Past Medical History:  Diagnosis Date   Gout    Hyperlipidemia     Hypertension    Prediabetes    Past Surgical History:  Procedure Laterality Date   Cardiac Event Monitor  May-June 2017   Sinus rhythm with average heart rate 74.2 - no notable arrhythmias. Minimal PVCs. No PACs.   CARPAL TUNNEL RELEASE Left 2007   CATARACT EXTRACTION, BILATERAL     OTHER SURGICAL HISTORY  2007   Negative biopsy tonsil mass   ROTATOR CUFF REPAIR Left 2000   Social History:  reports that he has never smoked. He has never used smokeless tobacco. He reports current alcohol use of about 15.0 standard drinks of alcohol per week. He reports that he does not use drugs.  Allergies  Allergen Reactions   Ace Inhibitors Cough   Lipitor [Atorvastatin]     Elevates CPK and Aldolase    Family History  Problem Relation Age of Onset   Hypertension Mother    Stroke Father    Seizures Sister    Colon cancer Neg Hx    Stomach cancer Neg Hx    Esophageal cancer Neg Hx    Inflammatory bowel disease Neg Hx    Liver disease Neg Hx    Pancreatic cancer Neg Hx    Rectal cancer Neg Hx     Prior to Admission medications   Medication Sig Start Date End Date Taking? Authorizing Provider  allopurinol  (ZYLOPRIM ) 300 MG tablet Take  1 tablet  Daily for For Gout Prevention                                                                /  TAKE                                         BY                                                 MOUTH 10/24/22   [name], MD  amLODipine  (NORVASC ) 2.5 MG tablet Take 1 tablet (2.5 mg total) by mouth daily. 12/27/22   Krishnan, Gokul, MD  aspirin  EC 81 MG tablet Take 1 tablet (81 mg total) by mouth daily. Swallow whole. 12/26/22 03/26/23  Krishnan, Gokul, MD  Cholecalciferol (VITAMIN D  PO) Take 5,000 Int'l Units by mouth daily.    [provider]  ezetimibe  (ZETIA ) 10 MG tablet TAKE 1 TABLET BY MOUTH DAILY FOR CHOLESTEROL 02/02/23   Wilkinson, Dana E, NP  famotidine  (PEPCID )  20 MG tablet TAKE 1 TABLET BY MOUTH TWICE  DAILY BEFORE BREAKFAST AND  SUPPER TO PREVENT HEARTBURN AND  INDIGESTION 01/11/23   Wilkinson, Dana E, NP  furosemide  (LASIX ) 40 MG tablet Take 1 tablet Daily as needed for  Fluid Retention / Ankle Swelling 11/09/22   [name], MD  gabapentin  (NEURONTIN ) 300 MG capsule TAKE 1 TO 2 CAPSULES BY MOUTH 1  TO 2 HOURS BEFORE BEDTIME AS  NEEDED FOR SLEEP 12/08/22   Cranford, Tonya, NP  gemfibrozil  (LOPID ) 600 MG tablet TAKE 1 TABLET BY MOUTH TWICE  DAILY WITH MEALS FOR CHOLESTEROL 07/21/22   Wilkinson, Dana E, NP  oxybutynin  (DITROPAN -XL) 10 MG 24 hr tablet Take  1 tablet Daily for Bladder                                                                 /                                                     TAKE                       BY                                                 MOUTH Patient taking differently: Take 10 mg by mouth daily as needed (retention). Take  1 tablet Daily for Bladder                                                                 /  TAKE                       BY                                                 MOUTH 12/14/22   [name], MD  thiamine  (VITAMIN B-1) 100 MG tablet Take 1 tablet (100 mg total) by mouth daily. 03/11/23   Wilkinson, Dana E, NP  triamcinolone  (NASACORT ) 55 MCG/ACT AERO nasal inhaler Place 2 sprays into the nose at bedtime. Patient taking differently: Place 2 sprays into the nose daily as needed (allergies). 07/01/21 12/31/22  Jude Lonell BRAVO, NP    Physical Exam: Vitals:   03/16/23 1430 03/16/23 1500 03/16/23 1530 03/16/23 1532  BP: 129/87 135/80 139/74   Pulse: 66 68 63   Resp: 15 17 14    Temp:    98 F (36.7 C)  TempSrc:    Oral  SpO2: 100% 99% 100%    General:  Appears calm and comfortable and is in NAD Eyes:  PERRL, EOMI, normal lids, iris ENT:  grossly normal hearing, lips & tongue, mmm; appropriate dentition Neck:  no LAD, masses or  thyromegaly; no carotid bruits Cardiovascular:  RRR, no m/r/g. No LE edema.  Respiratory:   CTA bilaterally with no wheezes/rales/rhonchi.  Normal respiratory effort. Chest tube on right side Abdomen:  soft, NT, ND, NABS Back:   normal alignment, no CVAT Skin:  no rash or induration seen on limited exam Musculoskeletal:  grossly normal tone BUE/BLE, good ROM, no bony abnormality Lower extremity:  No LE edema.  Limited foot exam with no ulcerations.  2+ distal pulses. Psychiatric:  grossly normal mood and affect, speech fluent and appropriate, AOx3 Neurologic:  CN 2-12 grossly intact, moves all extremities in coordinated fashion, sensation intact   Radiological Exams on Admission: Independently reviewed - see discussion in A/P where applicable  DG Chest Portable 1 View Result Date: 03/16/2023 CLINICAL DATA:  Status post chest tube. EXAM: PORTABLE CHEST 1 VIEW COMPARISON:  03/16/2023. FINDINGS: There is complete resolution of right pneumothorax, status post right-sided pleural drainage catheter placement. There are nonspecific but predominantly linear opacities overlying the right mid lower lung zones, which may represent underlying atelectasis and or scarring. There are probable atelectatic changes at the left lung base. Bilateral lung fields are otherwise clear. There is blunting of right lateral costophrenic angle, which may represent trace right pleural effusion versus pleural thickening. Left lateral costophrenic angle is clear. Stable cardio-mediastinal silhouette. No acute osseous abnormalities. The soft tissues are within normal limits. IMPRESSION: *Complete resolution of right pneumothorax status post right-sided pleural drainage catheter placement. *Atelectasis and/or scarring overlying the right mid lower lung zones and patchy atelectatic changes at the left lung base. Electronically Signed   By: Ree Molt M.D.   On: 03/16/2023 15:26   DG Chest 2 View Result Date: 03/16/2023 CLINICAL  DATA:  Shortness of breath with chest pressure and hypoxemia. History of pneumothorax. EXAM: CHEST - 2 VIEW COMPARISON:  Radiographs 02/03/2023 and 12/28/2022.  CT 02/03/2023. FINDINGS: There is a recurrent large right-sided pneumothorax (more than 75%). The right lung is nearly fully collapse, although there is no mediastinal shift. The left lung is clear. No significant pleural effusion. The heart size and mediastinal contours are stable with aortic atherosclerosis. No acute fractures are identified.  There are degenerative changes in the spine and both shoulders. IMPRESSION: Recurrent large right-sided pneumothorax with near complete collapse of the right lung. No mediastinal shift to suggest tension physiology at this time. However, based on the size of this pneumothorax, chest tube placement likely indicated. Critical Value/emergent results were called by telephone at the time of interpretation on 03/16/2023 at 12:30 pm to representative of provider JOSEPH STEVENS Fredi, PA), who verbally acknowledged these results. He reports that the clinical team is aware of the pneumothorax. Electronically Signed   By: Elsie Perone M.D.   On: 03/16/2023 12:34    EKG: Independently reviewed.  NSR with rate 91; nonspecific ST changes with no evidence of acute ischemia   Labs on Admission: I have personally reviewed the available labs and imaging studies at the time of the admission.  Pertinent labs:   None   Assessment and Plan: Principal Problem:   Spontaneous pneumothorax Active Problems:   Essential hypertension   Hyperlipidemia, mixed   Gastroesophageal reflux disease without esophagitis   Alcohol use   Idiopathic gout   Insomnia   OSA on CPAP    Assessment and Plan: * Spontaneous pneumothorax 89 year old presenting to ED with sudden onset shortness of breath and chest tightness last night found to have a spontaneous pneumothorax  -obs to progressive -s/p chest tube in ED and reexpansion,  feeling better  -on supplemental oxygen -pulm consulted -second pneumothorax since 12/2022. Did have f/u with pulm  -no hx of smoking, COPD.  -did cough last night and wore CPAP  -repeat CXR shows resolution  -f/u on pulm recs   Essential hypertension Well controlled states he is no longer on novasc and reviewed home med list. Do not see this listed.  Monitor pressures   Hyperlipidemia, mixed Statin intolerant Continue his zetia  and lopid    Gastroesophageal reflux disease without esophagitis Continue pepcid  BID   Alcohol use Check ethanol level  Reports 2-3 beers/week CIWA protocol +nutriceuticals   Idiopathic gout Continue allopurinol    Insomnia Continue gabapentin  PRN at night   OSA on CPAP Does use cpap Hold in setting of pneumothorax     Advance Care Planning:   Code Status: Prior  Full Consults: pulmonology   DVT Prophylaxis: SCDs  Family Communication: none   Severity of Illness: The appropriate patient status for this patient is OBSERVATION. Observation status is judged to be reasonable and necessary in order to provide the required intensity of service to ensure the patient's safety. The patient's presenting symptoms, physical exam findings, and initial radiographic and laboratory data in the context of their medical condition is felt to place them at decreased risk for further clinical deterioration. Furthermore, it is anticipated that the patient will be medically stable for discharge from the hospital within 2 midnights of admission.   Author: Isaiah Geralds, MD 03/16/2023 4:05 PM  For on call review [email or web address].

## 2023-03-16 NOTE — Assessment & Plan Note (Signed)
 Does use cpap Hold in setting of pneumothorax

## 2023-03-16 NOTE — Assessment & Plan Note (Signed)
 Statin intolerant Continue his zetia and lopid

## 2023-03-16 NOTE — ED Provider Notes (Signed)
 [facility] Provider Note  CSN: [id number] Arrival date & time: 03/16/23 1123  Chief Complaint(s) Shortness of Breath  HPI Jeremy Boone is a 89 y.o. male history of hyperlipidemia, hypertension, previous spontaneous pneumothorax presenting to the emergency department with chest pain.  Reports pain in the right side of the chest.  Also reports some shortness of breath.  Pain began last night after coughing.  He reports this morning after dropping his wife off at adult daycare, he had worsening shortness of breath and called 911.  Pain is worse with deep breath.  No fevers or chills.  No productive cough.  No back pain.  No abdominal pain.  No headaches.  No falls or trauma.   Past Medical History Past Medical History:  Diagnosis Date   Gout    Hyperlipidemia    Hypertension    Prediabetes    Patient Active Problem List   Diagnosis Date Noted   Spontaneous pneumothorax 12/24/2022   Demand ischemia (HCC) 12/24/2022   Alcohol use 12/24/2022   Insomnia 06/27/2020   Statin myopathy 11/21/2019   Constipation 12/15/2017   OSA on CPAP 04/19/2015   Vitamin D  deficiency 09/14/2013   Medication management 09/14/2013   History of colonic polyps 08/02/2013   Gastroesophageal reflux disease without esophagitis 08/02/2013   Hyperlipidemia, mixed    Essential hypertension    Idiopathic gout    Abnormal glucose    Home Medication(s) Prior to Admission medications   Medication Sig Start Date End Date Taking? Authorizing Provider  allopurinol  (ZYLOPRIM ) 300 MG tablet Take  1 tablet  Daily for For Gout Prevention                                                                /                                                                   TAKE                                         BY                                                 MOUTH 10/24/22   [name], MD  amLODipine  (NORVASC ) 2.5 MG tablet Take 1 tablet (2.5 mg total) by mouth  daily. 12/27/22   Krishnan, Gokul, MD  aspirin  EC 81 MG tablet Take 1 tablet (81 mg total) by mouth daily. Swallow whole. 12/26/22 03/26/23  Krishnan, Gokul, MD  Cholecalciferol (VITAMIN D  PO) Take 5,000 Int'l Units by mouth daily.    [provider]  ezetimibe  (ZETIA ) 10 MG tablet TAKE 1 TABLET BY MOUTH DAILY FOR CHOLESTEROL 02/02/23   Wilkinson, Dana E, NP  famotidine  (PEPCID ) 20 MG tablet TAKE 1  TABLET BY MOUTH TWICE  DAILY BEFORE BREAKFAST AND  SUPPER TO PREVENT HEARTBURN AND  INDIGESTION 01/11/23   Wilkinson, Dana E, NP  furosemide  (LASIX ) 40 MG tablet Take 1 tablet Daily as needed for  Fluid Retention / Ankle Swelling 11/09/22   [name], MD  gabapentin  (NEURONTIN ) 300 MG capsule TAKE 1 TO 2 CAPSULES BY MOUTH 1  TO 2 HOURS BEFORE BEDTIME AS  NEEDED FOR SLEEP 12/08/22   Cranford, Tonya, NP  gemfibrozil  (LOPID ) 600 MG tablet TAKE 1 TABLET BY MOUTH TWICE  DAILY WITH MEALS FOR CHOLESTEROL 07/21/22   Wilkinson, Dana E, NP  oxybutynin  (DITROPAN -XL) 10 MG 24 hr tablet Take  1 tablet Daily for Bladder                                                                 /                                                     TAKE                       BY                                                 MOUTH Patient taking differently: Take 10 mg by mouth daily as needed (retention). Take  1 tablet Daily for Bladder                                                                 /                                                     TAKE                       BY                                                 MOUTH 12/14/22   [name], MD  thiamine  (VITAMIN B-1) 100 MG tablet Take 1 tablet (100 mg total) by mouth daily. 03/11/23   Wilkinson, Dana E, NP  triamcinolone  (NASACORT ) 55 MCG/ACT AERO nasal inhaler Place 2 sprays into the nose at bedtime. Patient taking differently: Place 2 sprays into the nose daily as needed (allergies). 07/01/21 12/31/22  Wilkinson, Dana E, NP  Past Surgical History Past Surgical History:  Procedure Laterality Date   Cardiac Event Monitor  May-June 2017   Sinus rhythm with average heart rate 74.2 - no notable arrhythmias. Minimal PVCs. No PACs.   CARPAL TUNNEL RELEASE Left 2007   CATARACT EXTRACTION, BILATERAL     OTHER SURGICAL HISTORY  2007   Negative biopsy tonsil mass   ROTATOR CUFF REPAIR Left 2000   Family History Family History  Problem Relation Age of Onset   Hypertension Mother    Stroke Father    Seizures Sister    Colon cancer Neg Hx    Stomach cancer Neg Hx    Esophageal cancer Neg Hx    Inflammatory bowel disease Neg Hx    Liver disease Neg Hx    Pancreatic cancer Neg Hx    Rectal cancer Neg Hx     Social History Social History   Tobacco Use   Smoking status: Never   Smokeless tobacco: Never  Vaping Use   Vaping status: Never Used  Substance Use Topics   Alcohol use: Yes    Alcohol/week: 15.0 standard drinks of alcohol    Types: 15 Standard drinks or equivalent per week    Comment: every day 2-3 drimks a day    Drug use: No   Allergies Ace inhibitors and Lipitor [atorvastatin]  Review of Systems Review of Systems  All other systems reviewed and are negative.   Physical Exam Vital Signs  I have reviewed the triage vital signs BP 129/87   Pulse 66   Temp 98 F (36.7 C) (Oral)   Resp 15   SpO2 100%  Physical Exam Vitals and nursing note reviewed.  Constitutional:      General: He is not in acute distress.    Appearance: Normal appearance.  HENT:     Mouth/Throat:     Mouth: Mucous membranes are moist.  Eyes:     Conjunctiva/sclera: Conjunctivae normal.  Cardiovascular:     Rate and Rhythm: Normal rate and regular rhythm.  Pulmonary:     Effort: Pulmonary effort is normal. No respiratory distress.     Breath sounds: Examination of the right-upper field reveals decreased  breath sounds. Examination of the right-middle field reveals decreased breath sounds. Examination of the right-lower field reveals decreased breath sounds. Decreased breath sounds present.  Abdominal:     General: Abdomen is flat.     Palpations: Abdomen is soft.     Tenderness: There is no abdominal tenderness.  Musculoskeletal:     Right lower leg: No edema.     Left lower leg: No edema.  Skin:    General: Skin is warm and dry.     Capillary Refill: Capillary refill takes less than 2 seconds.  Neurological:     Mental Status: He is alert and oriented to person, place, and time. Mental status is at baseline.  Psychiatric:        Mood and Affect: Mood normal.        Behavior: Behavior normal.     ED Results and Treatments Labs (all labs ordered are listed, but only abnormal results are displayed) Labs Reviewed  BRAIN NATRIURETIC PEPTIDE  CBC WITH DIFFERENTIAL/PLATELET  BASIC METABOLIC PANEL  ETHANOL  RAPID URINE DRUG SCREEN, HOSP PERFORMED  TROPONIN I (HIGH SENSITIVITY)  Radiology DG Chest Portable 1 View Result Date: 03/16/2023 CLINICAL DATA:  Status post chest tube. EXAM: PORTABLE CHEST 1 VIEW COMPARISON:  03/16/2023. FINDINGS: There is complete resolution of right pneumothorax, status post right-sided pleural drainage catheter placement. There are nonspecific but predominantly linear opacities overlying the right mid lower lung zones, which may represent underlying atelectasis and or scarring. There are probable atelectatic changes at the left lung base. Bilateral lung fields are otherwise clear. There is blunting of right lateral costophrenic angle, which may represent trace right pleural effusion versus pleural thickening. Left lateral costophrenic angle is clear. Stable cardio-mediastinal silhouette. No acute osseous abnormalities. The soft tissues are within  normal limits. IMPRESSION: *Complete resolution of right pneumothorax status post right-sided pleural drainage catheter placement. *Atelectasis and/or scarring overlying the right mid lower lung zones and patchy atelectatic changes at the left lung base. Electronically Signed   By: Ree Molt M.D.   On: 03/16/2023 15:26   DG Chest 2 View Result Date: 03/16/2023 CLINICAL DATA:  Shortness of breath with chest pressure and hypoxemia. History of pneumothorax. EXAM: CHEST - 2 VIEW COMPARISON:  Radiographs 02/03/2023 and 12/28/2022.  CT 02/03/2023. FINDINGS: There is a recurrent large right-sided pneumothorax (more than 75%). The right lung is nearly fully collapse, although there is no mediastinal shift. The left lung is clear. No significant pleural effusion. The heart size and mediastinal contours are stable with aortic atherosclerosis. No acute fractures are identified. There are degenerative changes in the spine and both shoulders. IMPRESSION: Recurrent large right-sided pneumothorax with near complete collapse of the right lung. No mediastinal shift to suggest tension physiology at this time. However, based on the size of this pneumothorax, chest tube placement likely indicated. Critical Value/emergent results were called by telephone at the time of interpretation on 03/16/2023 at 12:30 pm to representative of provider JOSEPH STEVENS Fredi, PA), who verbally acknowledged these results. He reports that the clinical team is aware of the pneumothorax. Electronically Signed   By: Elsie Perone M.D.   On: 03/16/2023 12:34    Pertinent labs & imaging results that were available during my care of the patient were reviewed by me and considered in my medical decision making (see MDM for details).  Medications Ordered in ED Medications  LORazepam  (ATIVAN ) tablet 1-4 mg (has no administration in time range)    Or  LORazepam  (ATIVAN ) injection 1-4 mg (has no administration in time range)  thiamine  (VITAMIN  B1) tablet 100 mg (has no administration in time range)    Or  thiamine  (VITAMIN B1) injection 100 mg (has no administration in time range)  folic acid  (FOLVITE ) tablet 1 mg (has no administration in time range)  multivitamin with minerals tablet 1 tablet (has no administration in time range)  fentaNYL  (SUBLIMAZE ) injection 50 mcg (50 mcg Intravenous Given 03/16/23 1359)  Procedures .Critical Care  Performed by: Francesca Elsie CROME, MD Authorized by: Francesca Elsie CROME, MD   Critical care provider statement:    Critical care time (minutes):  30   Critical care time was exclusive of:  Separately billable procedures and treating other patients   Critical care was necessary to treat or prevent imminent or life-threatening deterioration of the following conditions:  Respiratory failure   Critical care was time spent personally by me on the following activities:  Development of treatment plan with patient or surrogate, discussions with consultants, evaluation of patient's response to treatment, examination of patient, ordering and review of laboratory studies, ordering and review of radiographic studies, ordering and performing treatments and interventions, pulse oximetry, re-evaluation of patient's condition and review of old charts CHEST TUBE INSERTION  Date/Time: 03/16/2023 3:38 PM  Performed by: Francesca Elsie CROME, MD Authorized by: Francesca Elsie CROME, MD   Consent:    Consent obtained:  Verbal and written   Consent given by:  Patient   Risks, benefits, and alternatives were discussed: yes     Risks discussed:  Bleeding, damage to surrounding structures, infection, incomplete drainage, nerve damage and pain   Alternatives discussed:  No treatment and observation Universal protocol:    Procedure explained and questions answered to patient or proxy's  satisfaction: yes     Relevant documents present and verified: yes     Test results available: yes     Imaging studies available: yes     Required blood products, implants, devices, and special equipment available: yes     Site/side marked: yes     Immediately prior to procedure, a time out was called: yes     Patient identity confirmed:  Verbally with patient and arm band Pre-procedure details:    Skin preparation:  Chlorhexidine with alcohol   Preparation: Patient was prepped and draped in the usual sterile fashion   Sedation:    Sedation type:  None Anesthesia:    Anesthesia method:  Local infiltration   Local anesthetic:  Lidocaine  1% w/o epi Procedure details:    Placement location:  R lateral   Scalpel size:  11   Tube size (French): pigtail catheter.   Dissection instrument: seldinger technique.   Ultrasound guidance: yes     Tension pneumothorax: no     Tube connected to:  Suction   Drainage characteristics:  Air only   Suture material:  0 silk   Dressing:  Xeroform gauze and 4x4 sterile gauze Post-procedure details:    Post-insertion x-ray findings: tube in good position     Procedure completion:  Tolerated well, no immediate complications   (including critical care time)  Medical Decision Making / ED Course   MDM:  89 year old male presenting to the emergency department shortness of breath.  Patient overall very well-appearing, was found to be hypoxic on arrival to 87%.  This improved with oxygen.  X-ray was performed in triage which does demonstrate a pneumothorax.  Patient does have prior history of spontaneous pneumothorax.  Unclear cause, was not possibly due to emphysematous changes previously.  Given age and hypoxia will need admission.  Chest tube was placed after patient consented for procedure.  Placement uneventful.  Repeat chest x-ray shows expansion of the lung.  Discussed with pulmonology who will consult.  Discussed with the hospitalist will admit the  patient.      Additional history obtained: -Additional history obtained from ems -External records from outside source obtained and reviewed including: Chart review including  previous notes, labs, imaging, consultation notes including previous notes    Lab Tests: -I ordered, reviewed, and interpreted labs.   The pertinent results include:   Labs Reviewed  BRAIN NATRIURETIC PEPTIDE  CBC WITH DIFFERENTIAL/PLATELET  BASIC METABOLIC PANEL  ETHANOL  RAPID URINE DRUG SCREEN, HOSP PERFORMED  TROPONIN I (HIGH SENSITIVITY)    Notable for normal labs   EKG   EKG Interpretation Date/Time:  Tuesday March 16 2023 13:17:26 EST Ventricular Rate:  91 PR Interval:  165 QRS Duration:  80 QT Interval:  354 QTC Calculation: 436 R Axis:   -10  Text Interpretation: Sinus rhythm Posterior infarct, old Confirmed by Francesca Fallow ([id number]) on 03/16/2023 3:05:34 PM         Imaging Studies ordered: I ordered imaging studies including CXR On my interpretation imaging demonstrates pneumothorax I independently visualized and interpreted imaging. I agree with the radiologist interpretation   Medicines ordered and prescription drug management: Meds ordered this encounter  Medications   fentaNYL  (SUBLIMAZE ) injection 50 mcg   OR Linked Order Group    LORazepam  (ATIVAN ) tablet 1-4 mg     CIWA-AR < 5 =:   0 mg     CIWA-AR 5 -10 =:   1 mg     CIWA-AR 11 -15 =:   2 mg     CIWA-AR 16 -20 =:   3 mg     CIWA-AR 16 -20 =:   Recheck CIWA-AR in 1 hour; if > 20 notify MD     CIWA-AR > 20 =:   4 mg     CIWA-AR > 20 =:   Call Rapid Response    LORazepam  (ATIVAN ) injection 1-4 mg     CIWA-AR < 5 =:   0 mg     CIWA-AR 5 -10 =:   1 mg     CIWA-AR 11 -15 =:   2 mg     CIWA-AR 16 -20 =:   3 mg     CIWA-AR 16 -20 =:   Recheck CIWA-AR in 1 hour; if > 20 notify MD     CIWA-AR > 20 =:   4 mg     CIWA-AR > 20 =:   Call Rapid Response   OR Linked Order Group    thiamine  (VITAMIN B1) tablet 100 mg     thiamine  (VITAMIN B1) injection 100 mg   folic acid  (FOLVITE ) tablet 1 mg   multivitamin with minerals tablet 1 tablet    -I have reviewed the patients home medicines and have made adjustments as needed   Consultations Obtained: I requested consultation with the pulmonologist,  and discussed lab and imaging findings as well as pertinent plan - they recommend: they will consult   Cardiac Monitoring: The patient was maintained on a cardiac monitor.  I personally viewed and interpreted the cardiac monitored which showed an underlying rhythm of: NSR  Social Determinants of Health:  Diagnosis or treatment significantly limited by social determinants of health: alcohol use   Reevaluation: After the interventions noted above, I reevaluated the patient and found that their symptoms have improved  Co morbidities that complicate the patient evaluation  Past Medical History:  Diagnosis Date   Gout    Hyperlipidemia    Hypertension    Prediabetes       Dispostion: Disposition decision including need for hospitalization was considered, and patient admitted to the hospital.    Final Clinical Impression(s) / ED Diagnoses Final diagnoses:  Spontaneous pneumothorax  This chart was dictated using voice recognition software.  Despite best efforts to proofread,  errors can occur which can change the documentation meaning.    Francesca Elsie CROME, MD 03/16/23 [phone]

## 2023-03-16 NOTE — Consult Note (Addendum)
 NAME:  Jeremy Boone, MRN:  [id number], DOB:  10-30-1934, LOS: 0 ADMISSION DATE:  03/16/2023, CONSULTATION DATE:  03/16/2023 REFERRING MD:  EDP, CHIEF COMPLAINT:  spontaneous pneumothorax   History of Present Illness:  Jeremy Boone is a 89 y.o. man with history of pneumothorax. Never smoker. 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 Does drink alcohol. Here with shortness of breath and chest pain since last night which worsened this morning. Symptoms similar to the spontanous ptx he had fall 2024. Denies trauma to the area. The dyspnea actually started when he woke up in the middle of the night to go to the bathroom.   Pertinent  Medical History  Spontanous ptx  Significant Hospital Events: Including procedures, antibiotic start and stop dates in addition to other pertinent events     Interim History / Subjective:    Objective   Blood pressure 139/74, pulse 63, temperature 98 F (36.7 C), temperature source Oral, resp. rate 14, SpO2 100%.       No intake or output data in the 24 hours ending 03/16/23 1602 There were no vitals filed for this visit.  Examination: General: laying flat comfortably no respiratory distress HENT: on nasal cannula Lungs: ctab no wheeze, right pigtail chest tube in place, no air leak, to suction Cardiovascular: RRR no mrg Abdomen: soft, nontender Extremities: no peripheral edema Neuro: normal speech no focal asymmetry  Reviewed his chest xray which shows right sided pigtail catheter, lung re-expanded Ct chest from oct 2024 does not show any significant bullous emphysematous disease. No ILD.  Resolved Hospital Problem list     Assessment & Plan:   Recurrent Spontaneous Pneumothorax  Continue chest tube to low wall suction Pain control Chest xray in am Consideration for talc  pleurodesis. Will discuss with thoracic surgery.   Pulmonary will follow.   Jeremy Gore, MD Pulmonary and Critical Care Medicine [facility] 03/16/2023  4:26 PM Pager: see AMION  If no response to pager, please call critical care on call (see AMION) until 7pm After 7:00 pm call Elink      Labs   CBC: Recent Labs  Lab 03/11/23 1621 03/16/23 1356  WBC 5.9 5.2  NEUTROABS 1,941 2.1  HGB 14.6 14.9  HCT 44.8 45.4  MCV 92.4 93.0  PLT 283 274    Basic Metabolic Panel: Recent Labs  Lab 03/11/23 1621 03/16/23 1356  NA 139 138  K 4.4 4.1  CL 101 104  CO2 28 23  GLUCOSE 64* 84  BUN 14 15  CREATININE 0.96 0.96  CALCIUM  9.7 9.1  MG 2.2  --    GFR: Estimated Creatinine Clearance: 59.8 mL/min (by C-G formula based on SCr of 0.96 mg/dL). Recent Labs  Lab 03/11/23 1621 03/16/23 1356  WBC 5.9 5.2    Liver Function Tests: Recent Labs  Lab 03/11/23 1621  AST 20  ALT 10  BILITOT 0.3  PROT 8.1   No results for input(s): LIPASE, AMYLASE in the last 168 hours. No results for input(s): AMMONIA in the last 168 hours.  ABG No results found for: PHART, PCO2ART, PO2ART, HCO3, TCO2, ACIDBASEDEF, O2SAT   Coagulation Profile: No results for input(s): INR, PROTIME in the last 168 hours.  Cardiac Enzymes: No results for input(s): CKTOTAL, CKMB, CKMBINDEX, TROPONINI in the last 168 hours.  HbA1C: Hgb A1c MFr Bld  Date/Time Value Ref Range Status  11/09/2022 03:30 PM 6.4 (H) <5.7 % of total Hgb Final    Comment:    For someone without known diabetes,  a hemoglobin  A1c value between 5.7% and 6.4% is consistent with prediabetes and should be confirmed with a  follow-up test. . For someone with known diabetes, a value <7% indicates that their diabetes is well controlled. A1c targets should be individualized based on duration of diabetes, age, comorbid conditions, and other considerations. . This assay result is consistent with an increased risk of diabetes. . Currently, no consensus exists regarding use of hemoglobin A1c for diagnosis of diabetes for children. SABRA   04/21/2022 11:20 AM  6.1 (H) <5.7 % of total Hgb Final    Comment:    For someone without known diabetes, a hemoglobin  A1c value between 5.7% and 6.4% is consistent with prediabetes and should be confirmed with a  follow-up test. . For someone with known diabetes, a value <7% indicates that their diabetes is well controlled. A1c targets should be individualized based on duration of diabetes, age, comorbid conditions, and other considerations. . This assay result is consistent with an increased risk of diabetes. . Currently, no consensus exists regarding use of hemoglobin A1c for diagnosis of diabetes for children. .     CBG: No results for input(s): GLUCAP in the last 168 hours.  Review of Systems:   Chest pain, shortness of breath  Past Medical History:  He,  has a past medical history of Gout, Hyperlipidemia, Hypertension, and Prediabetes.   Surgical History:   Past Surgical History:  Procedure Laterality Date   Cardiac Event Monitor  May-June 2017   Sinus rhythm with average heart rate 74.2 - no notable arrhythmias. Minimal PVCs. No PACs.   CARPAL TUNNEL RELEASE Left 2007   CATARACT EXTRACTION, BILATERAL     OTHER SURGICAL HISTORY  2007   Negative biopsy tonsil mass   ROTATOR CUFF REPAIR Left 2000     Social History:   reports that he has never smoked. He has never used smokeless tobacco. He reports current alcohol use of about 15.0 standard drinks of alcohol per week. He reports that he does not use drugs.   Family History:  His family history includes Hypertension in his mother; Seizures in his sister; Stroke in his father. There is no history of Colon cancer, Stomach cancer, Esophageal cancer, Inflammatory bowel disease, Liver disease, Pancreatic cancer, or Rectal cancer.   Allergies Allergies  Allergen Reactions   Ace Inhibitors Cough   Lipitor [Atorvastatin]     Elevates CPK and Aldolase     Home Medications  Prior to Admission medications   Medication Sig Start Date  End Date Taking? Authorizing Provider  allopurinol  (ZYLOPRIM ) 300 MG tablet Take  1 tablet  Daily for For Gout Prevention                                                                /                                                                   TAKE  BY                                                 MOUTH 10/24/22   [name], MD  amLODipine  (NORVASC ) 2.5 MG tablet Take 1 tablet (2.5 mg total) by mouth daily. 12/27/22   Krishnan, Gokul, MD  Cholecalciferol (VITAMIN D  PO) Take 5,000 Int'l Units by mouth daily.    [provider]  ezetimibe  (ZETIA ) 10 MG tablet TAKE 1 TABLET BY MOUTH DAILY FOR CHOLESTEROL 02/02/23   Wilkinson, Dana E, NP  famotidine  (PEPCID ) 20 MG tablet TAKE 1 TABLET BY MOUTH TWICE  DAILY BEFORE BREAKFAST AND  SUPPER TO PREVENT HEARTBURN AND  INDIGESTION 01/11/23   Wilkinson, Dana E, NP  furosemide  (LASIX ) 40 MG tablet Take 1 tablet Daily as needed for  Fluid Retention / Ankle Swelling 11/09/22   [name], MD  gabapentin  (NEURONTIN ) 300 MG capsule TAKE 1 TO 2 CAPSULES BY MOUTH 1  TO 2 HOURS BEFORE BEDTIME AS  NEEDED FOR SLEEP 12/08/22   Cranford, Tonya, NP  gemfibrozil  (LOPID ) 600 MG tablet TAKE 1 TABLET BY MOUTH TWICE  DAILY WITH MEALS FOR CHOLESTEROL 07/21/22   Wilkinson, Dana E, NP  oxybutynin  (DITROPAN -XL) 10 MG 24 hr tablet Take  1 tablet Daily for Bladder                                                                 /                                                     TAKE                       BY                                                 MOUTH Patient taking differently: Take 10 mg by mouth daily as needed (retention). Take  1 tablet Daily for Bladder                                                                 /                                                     TAKE                       BY  MOUTH 12/14/22   [name], MD  thiamine  (VITAMIN B-1) 100  MG tablet Take 1 tablet (100 mg total) by mouth daily. 03/11/23   Wilkinson, Dana E, NP  triamcinolone  (NASACORT ) 55 MCG/ACT AERO nasal inhaler Place 2 sprays into the nose at bedtime. Patient taking differently: Place 2 sprays into the nose daily as needed (allergies). 07/01/21 12/31/22  Wilkinson, Dana E, NP     Critical care time:

## 2023-03-17 DIAGNOSIS — I10 Essential (primary) hypertension: Secondary | ICD-10-CM | POA: Diagnosis present

## 2023-03-17 DIAGNOSIS — E782 Mixed hyperlipidemia: Secondary | ICD-10-CM | POA: Diagnosis present

## 2023-03-17 DIAGNOSIS — Z7982 Long term (current) use of aspirin: Secondary | ICD-10-CM | POA: Diagnosis not present

## 2023-03-17 DIAGNOSIS — Z888 Allergy status to other drugs, medicaments and biological substances status: Secondary | ICD-10-CM | POA: Diagnosis not present

## 2023-03-17 DIAGNOSIS — F1090 Alcohol use, unspecified, uncomplicated: Secondary | ICD-10-CM | POA: Diagnosis present

## 2023-03-17 DIAGNOSIS — J9383 Other pneumothorax: Secondary | ICD-10-CM | POA: Diagnosis not present

## 2023-03-17 DIAGNOSIS — I771 Stricture of artery: Secondary | ICD-10-CM | POA: Diagnosis not present

## 2023-03-17 DIAGNOSIS — R9389 Abnormal findings on diagnostic imaging of other specified body structures: Secondary | ICD-10-CM | POA: Diagnosis not present

## 2023-03-17 DIAGNOSIS — Z9841 Cataract extraction status, right eye: Secondary | ICD-10-CM | POA: Diagnosis not present

## 2023-03-17 DIAGNOSIS — M1000 Idiopathic gout, unspecified site: Secondary | ICD-10-CM | POA: Diagnosis present

## 2023-03-17 DIAGNOSIS — Z823 Family history of stroke: Secondary | ICD-10-CM | POA: Diagnosis not present

## 2023-03-17 DIAGNOSIS — R918 Other nonspecific abnormal finding of lung field: Secondary | ICD-10-CM | POA: Diagnosis not present

## 2023-03-17 DIAGNOSIS — G4700 Insomnia, unspecified: Secondary | ICD-10-CM | POA: Diagnosis present

## 2023-03-17 DIAGNOSIS — K219 Gastro-esophageal reflux disease without esophagitis: Secondary | ICD-10-CM | POA: Diagnosis present

## 2023-03-17 DIAGNOSIS — J9811 Atelectasis: Secondary | ICD-10-CM | POA: Diagnosis not present

## 2023-03-17 DIAGNOSIS — J939 Pneumothorax, unspecified: Secondary | ICD-10-CM | POA: Diagnosis not present

## 2023-03-17 DIAGNOSIS — Z8249 Family history of ischemic heart disease and other diseases of the circulatory system: Secondary | ICD-10-CM | POA: Diagnosis not present

## 2023-03-17 DIAGNOSIS — J984 Other disorders of lung: Secondary | ICD-10-CM | POA: Diagnosis not present

## 2023-03-17 DIAGNOSIS — R0902 Hypoxemia: Secondary | ICD-10-CM | POA: Diagnosis present

## 2023-03-17 DIAGNOSIS — Z79899 Other long term (current) drug therapy: Secondary | ICD-10-CM | POA: Diagnosis not present

## 2023-03-17 DIAGNOSIS — Z9842 Cataract extraction status, left eye: Secondary | ICD-10-CM | POA: Diagnosis not present

## 2023-03-17 DIAGNOSIS — G4733 Obstructive sleep apnea (adult) (pediatric): Secondary | ICD-10-CM | POA: Diagnosis present

## 2023-03-17 DIAGNOSIS — Z4682 Encounter for fitting and adjustment of non-vascular catheter: Secondary | ICD-10-CM | POA: Diagnosis not present

## 2023-03-17 LAB — BASIC METABOLIC PANEL
Anion gap: 8 (ref 5–15)
BUN: 20 mg/dL (ref 8–23)
CO2: 27 mmol/L (ref 22–32)
Calcium: 9 mg/dL (ref 8.9–10.3)
Chloride: 102 mmol/L (ref 98–111)
Creatinine, Ser: 1.14 mg/dL (ref 0.61–1.24)
GFR, Estimated: >60 mL/min (ref >60)
Glucose, Bld: 113 mg/dL — ABNORMAL HIGH (ref 70–99)
Potassium: 3.8 mmol/L (ref 3.5–5.1)
Sodium: 137 mmol/L (ref 135–145)

## 2023-03-17 MED ORDER — SODIUM CHLORIDE 0.9% FLUSH
10.0000 mL | Freq: Three times a day (TID) | INTRAVENOUS | Status: DC
  Administered 2023-03-17 – 2023-03-19 (×6): 10 mL via INTRAPLEURAL

## 2023-03-17 MED ORDER — HYDROMORPHONE HCL 1 MG/ML IJ SOLN
INTRAMUSCULAR | Status: AC
  Administered 2023-03-17: 1 mg
  Filled 2023-03-17: qty 1

## 2023-03-17 MED ORDER — LIDOCAINE HCL 1 % IJ SOLN
25.0000 mL | Freq: Once | INTRAMUSCULAR | Status: DC
  Filled 2023-03-17: qty 25

## 2023-03-17 MED ORDER — LIDOCAINE HCL (PF) 1 % IJ SOLN
25.0000 mL | Freq: Once | INTRAMUSCULAR | Status: AC
  Administered 2023-03-17: 25 mL
  Filled 2023-03-17: qty 30

## 2023-03-17 MED ORDER — POLYETHYLENE GLYCOL 3350 17 G PO PACK
17.0000 g | PACK | Freq: Every day | ORAL | Status: DC | PRN
  Administered 2023-03-17 – 2023-03-19 (×4): 17 g via ORAL
  Filled 2023-03-17 (×4): qty 1

## 2023-03-17 MED ORDER — GUAIFENESIN ER 600 MG PO TB12
1200.0000 mg | ORAL_TABLET | Freq: Two times a day (BID) | ORAL | Status: DC
  Administered 2023-03-17 – 2023-03-20 (×4): 1200 mg via ORAL
  Filled 2023-03-17 (×8): qty 2

## 2023-03-17 MED ORDER — TALC (STERITALC) POWDER FOR INTRAPLEURAL USE
4.0000 g | Freq: Once | INTRAPLEURAL | Status: AC
  Administered 2023-03-17: 4 g via INTRAPLEURAL
  Filled 2023-03-17: qty 4

## 2023-03-17 MED ORDER — HYDROMORPHONE HCL 1 MG/ML IJ SOLN: 1.0000 mg | INTRAMUSCULAR | Status: DC | PRN

## 2023-03-17 MED ORDER — HYDROMORPHONE HCL 1 MG/ML IJ SOLN
1.0000 mg | INTRAMUSCULAR | Status: DC | PRN
  Administered 2023-03-17 – 2023-03-19 (×2): 1 mg via INTRAVENOUS
  Filled 2023-03-17 (×2): qty 1

## 2023-03-17 NOTE — Progress Notes (Addendum)
PROGRESS NOTE  Jeremy Boone ZOX:[id number] DOB: [birth year] DOA: 03/16/2023 PCP: [name], MD  Brief History   The patient is an 89 yr old man who presented to [facility] Ed on 03/16/2023 with complaints of shortness of breath and chest pressure. The patient stated that the pain awoke him from sleep. As he went about his day the pain became more sever and he became acutely short of breath. He came to the ED and was found to have suffered a spontaneous pneumothorax.   Dr. Celine Mans was consulted and placed a chest tube in the patient to resolve the pneumothorax and then performed pleurodesis to prevent recurrence. The patient has had a previous occcurence of a spontaneous pneumothorax in September of 2024.  The patient has a past medical history significant for spontaneous pneumothorqax, OSA (not on CPAP). The patient does drink a couple of drinks a day, does not smoke, but has worked in [employer].  A & P  * Spontaneous pneumothorax 89 year old presenting to ED with sudden onset shortness of breath and chest tightness last night found to have a spontaneous pneumothorax  -obs to progressive -on supplemental oxygen -pulm consulted -second pneumothorax since 12/2022. Did have f/u with pulm  -no hx of smoking, COPD.  -did cough last night and wore CPAP  -repeat CXR shows resolution  -Pt has undergone chest tube placement and pleurodesis with Dr. Celine Mans.   Essential hypertension Well controlled states he is no longer on novasc and reviewed home med list. Do not see this listed.  Monitor pressures    Hyperlipidemia, mixed Statin intolerant Continue his zetia and lopid    Gastroesophageal reflux disease without esophagitis Continue pepcid BID    Alcohol use Check ethanol level  Reports 2-3 beers/week CIWA protocol +nutriceuticals    Idiopathic gout Continue allopurinol    Insomnia Continue gabapentin PRN at night    OSA on CPAP Does use cpap Hold in setting of pneumothorax    Hyponatremia Mild with Na 132. Clinically insignificant.  I have seen and examined this patient myself. I have spent 34 minutes in his evaluation and care.  DVT prophylaxis: SCD's Code Status: Full Code Family Communication: None available Disposition Plan: home    Laurieann Friddle, DO [facility] Direct contact: see [email or web address]  7PM-7AM contact night coverage as above 03/17/2023, 10:15 PM  LOS: 0 days   Consultants  PCCM  Procedures  Chest tube placement Pleurodesis  Antibiotics   Anti-infectives (From admission, onward)    None        Interval History/Subjective  The patient is resting comfortably. He states that he is feeling better.  Objective   Vitals:  Vitals:   03/17/23 1623 03/17/23 1918  BP: 138/80 (!) 126/106  Pulse: 89 88  Resp: 18 16  Temp: 97.8 F (36.6 C) 98.6 F (37 C)  SpO2: 93% 90%    Exam:  Exam:  Constitutional:  The patient is awake, alert, and oriented x 3. No acute distress. Respiratory:  No increased work of breathing. No wheezes, rales, or rhonchi No tactile fremitus Cardiovascular:  Regular rate and rhythm No murmurs, ectopy, or gallups. No lateral PMI. No thrills. Abdomen:  Abdomen is soft, non-tender, non-distended No hernias, masses, or organomegaly Normoactive bowel sounds.  Musculoskeletal:  No cyanosis, clubbing, or edema Skin:  No rashes, lesions, ulcers palpation of skin: no induration or nodules Neurologic:  CN 2-12 intact Sensation all 4 extremities intact Psychiatric:  Mental status Mood, affect appropriate Orientation to person, place, time  judgment and insight appear intact   I have personally reviewed the following:   Today's Data   Vitals:   03/17/23 1623 03/17/23 1918  BP: 138/80 (!) 126/106  Pulse: 89 88  Resp: 18 16  Temp: 97.8 F (36.6 C) 98.6 F (37 C)  SpO2: 93% 90%     Lab Data  CBC    Component Value Date/Time   WBC 5.2 03/16/2023 1356   RBC 4.88 03/16/2023  1356   HGB 14.9 03/16/2023 1356   HCT 45.4 03/16/2023 1356   PLT 274 03/16/2023 1356   MCV 93.0 03/16/2023 1356   MCH 30.5 03/16/2023 1356   MCHC 32.8 03/16/2023 1356   RDW 13.8 03/16/2023 1356   LYMPHSABS 2.3 03/16/2023 1356   MONOABS 0.5 03/16/2023 1356   EOSABS 0.3 03/16/2023 1356   BASOSABS 0.1 03/16/2023 1356      Latest Ref Rng & Units 03/17/2023    3:54 AM 03/16/2023    1:56 PM 03/11/2023    4:21 PM  BMP  Glucose 70 - 99 mg/dL 295  84  64   BUN 8 - 23 mg/dL 20  15  14    Creatinine 0.61 - 1.24 mg/dL 2.84  1.32  4.40   BUN/Creat Ratio 6 - 22 (calc)   SEE NOTE:   Sodium 135 - 145 mmol/L 137  138  139   Potassium 3.5 - 5.1 mmol/L 3.8  4.1  4.4   Chloride 98 - 111 mmol/L 102  104  101   CO2 22 - 32 mmol/L 27  23  28    Calcium 8.9 - 10.3 mg/dL 9.0  9.1  9.7      Micro Data   Results for orders placed or performed during the hospital encounter of 12/24/22  Resp panel by RT-PCR (RSV, Flu A&B, Covid) Anterior Nasal Swab     Status: None   Collection Time: 12/24/22  7:33 AM   Specimen: Anterior Nasal Swab  Result Value Ref Range Status   SARS Coronavirus 2 by RT PCR NEGATIVE NEGATIVE Final   Influenza A by PCR NEGATIVE NEGATIVE Final   Influenza B by PCR NEGATIVE NEGATIVE Final    Comment: (NOTE) The Xpert Xpress SARS-CoV-2/FLU/RSV plus assay is intended as an aid in the diagnosis of influenza from Nasopharyngeal swab specimens and should not be used as a sole basis for treatment. Nasal washings and aspirates are unacceptable for Xpert Xpress SARS-CoV-2/FLU/RSV testing.  Fact Sheet for Patients: BloggerCourse.com  Fact Sheet for Healthcare Providers: SeriousBroker.it  This test is not yet approved or cleared by the [place] FDA and has been authorized for detection and/or diagnosis of SARS-CoV-2 by FDA under an Emergency Use Authorization (EUA). This EUA will remain in effect (meaning this test can be used) for  the duration of the COVID-19 declaration under Section 564(b)(1) of the Act, 21 U.S.C. section 360bbb-3(b)(1), unless the authorization is terminated or revoked.     Resp Syncytial Virus by PCR NEGATIVE NEGATIVE Final    Comment: (NOTE) Fact Sheet for Patients: BloggerCourse.com  Fact Sheet for Healthcare Providers: SeriousBroker.it  This test is not yet approved or cleared by the [place] FDA and has been authorized for detection and/or diagnosis of SARS-CoV-2 by FDA under an Emergency Use Authorization (EUA). This EUA will remain in effect (meaning this test can be used) for the duration of the COVID-19 declaration under Section 564(b)(1) of the Act, 21 U.S.C. section 360bbb-3(b)(1), unless the authorization is terminated or revoked.  Performed at  [facility], [address], [place], [place] [address]   MRSA Next Gen by PCR, Nasal     Status: None   Collection Time: 12/25/22 11:11 AM   Specimen: Nasal Mucosa; Nasal Swab  Result Value Ref Range Status   MRSA by PCR Next Gen NOT DETECTED NOT DETECTED Final    Comment: (NOTE) The GeneXpert MRSA Assay (FDA approved for NASAL specimens only), is one component of a comprehensive MRSA colonization surveillance program. It is not intended to diagnose MRSA infection nor to guide or monitor treatment for MRSA infections. Test performance is not FDA approved in patients less than 32 years old. Performed at [facility], [address], [place], [place] [address]      Imaging  CXR  Scheduled Meds:  allopurinol  150 mg Oral Daily   ezetimibe  10 mg Oral Daily   famotidine  20 mg Oral BID   folic acid  1 mg Oral Daily   gabapentin  300 mg Oral QHS   gemfibrozil  600 mg Oral BID AC   guaiFENesin  1,200 mg Oral BID   multivitamin with minerals  1 tablet Oral Daily   sodium chloride flush  10 mL Intrapleural Q8H   thiamine  100 mg Oral Daily   Or   thiamine   100 mg Intravenous Daily   Continuous Infusions:  Principal Problem:   Spontaneous pneumothorax Active Problems:   Essential hypertension   Hyperlipidemia, mixed   Gastroesophageal reflux disease without esophagitis   Alcohol use   Idiopathic gout   Insomnia   OSA on CPAP   LOS: 0 days

## 2023-03-17 NOTE — Progress Notes (Signed)
   NAME:  Jeremy Boone, MRN:  [id number], DOB:  1934-10-16, LOS: 0 ADMISSION DATE:  03/16/2023, CONSULTATION DATE:  03/17/2023 REFERRING MD:  EDP, CHIEF COMPLAINT:  spontaneous pneumothorax   History of Present Illness:  Jeremy Boone is a 89 y.o. man with history of pneumothorax. Never smoker. Works in [employer] doing [employer] work. Does drink alcohol. Here with shortness of breath and chest pain since last night which worsened this morning. Symptoms similar to the spontanous ptx he had fall 2024. Denies trauma to the area. The dyspnea actually started when he woke up in the middle of the night to go to the bathroom.   Pertinent  Medical History  Spontanous ptx  Significant Hospital Events: Including procedures, antibiotic start and stop dates in addition to other pertinent events   1/14 admitted with ptx, pigtail placed in the ED.   Interim History / Subjective:    Objective   Blood pressure 117/72, pulse (!) 58, temperature 97.8 F (36.6 C), temperature source Oral, resp. rate 18, height 5\' 11"  (1.803 m), weight 79.4 kg, SpO2 94%.       No intake or output data in the 24 hours ending 03/17/23 1236 Filed Weights   03/16/23 2217  Weight: 79.4 kg    Examination: Sitting up on nasal cannula, no respiratory distress Breathing non labored RRR no mrg No peripheral edema Chest tube in right chest wall to suction, no air leak  Chest xray 1/14 with resolution of ptx  Resolved Hospital Problem list     Assessment & Plan:   Recurrent Spontaneous Pneumothorax, Right  Talc  pleurodesis today at bedside Pain control Chest xray in am Consideration for talc  pleurodesis, release stopcock after 2 hours (approximately 1:15)   maintain LWS Consider betadine pleurodesis if not resolved with talc .   Pulmonary will follow.   Louie Rover, MD Pulmonary and Critical Care Medicine [facility] 03/17/2023 12:36 PM Pager: see AMION  If no response to pager, please call  critical care on call (see AMION) until 7pm After 7:00 pm call Elink

## 2023-03-17 NOTE — Procedures (Signed)
 Chest Tube Pleurodesis Note  Jeremy Boone  [id number]  11-04-1934  Date:03/17/23  Time:12:43 PM   Provider Performing:Tomi Grandpre Laneta Pintos Dione Franks   Procedure: Chemical Pleurodesis via Chest Tube (32560)  Indication(s) Induced scarring of pleural space  Consent Risks of the procedure as well as the alternatives and risks of each were explained to the patient and/or caregiver.  Consent for the procedure was obtained.   Anesthesia 10cc 1% Lidocaine  injected in with pleurodesis agents, talc  slurry 50cc syringe x2   Time Out Verified patient identification, verified procedure, site/side was marked, verified correct patient position, special equipment/implants available, medications/allergies/relevant history reviewed, required imaging and test results available.   Sterile Technique Hand hygiene, gloves   Procedure Description Existing pleural catheter was cleaned and accessed in sterile manner.  Talc  4g in 50cc saline slurry administered into pleural space.  The chest tube will be clamped and patient will rotate positions for 2 hours then chest tube will be placed to suction.   Complications/Tolerance None; patient tolerated the procedure well.   EBL None   Specimen(s) None

## 2023-03-17 NOTE — Discharge Instructions (Addendum)
Maintain dressing covering chest tube site in place for 7 days, avoid getting soiled or wet.  May replace if becomes soiled or wet.  No bathing in pools/tubs for 7 days.                                     Outpatient Substance Abuse  Treatment- uninsured  Narcotics Anonymous 24-HOUR HELPLINE Pre-recorded for Meeting Schedules [facility] [phone]  [email or web address] ALCOHOLICS ANONYMOUS  [facility]  Answering Service [phone] Please Note: All [place] Meetings are Non-smoking [email or web address]  Alcohol and Drug Services -  Insurance: Medicaid /State funding/private insurance Methadone, suboxone/Intensive outpatient  [place]   [phone] Fax: [phone] [address], [place], [place], [address] [place] [phone] Fax: [phone]    [address] [place], [place], [address] ([address] [place], [place], [place], [place], [place], [place], [place], [place]) [facility] [email or web address] Accepts State funding/Medicaid Transitional housing, Intensive Outpatient Treatment, Outpatient treatment, Veterans Services  Phone: [phone] Fax: [phone] Address: [address] [place] [address]   [employer] [facility] ([email or web address]/) Insurance: Medicaid Case Management, Administrator, arts, Medication Management, Outpatient Therapy, Psychosocial Rehabilitation, Substance Abuse Intensive Outpatient  Phone: [phone] Fax: [phone] [address], [place], [place], [address]  [facility] Medicaid, most private insurance providers Types of Program: Individual/Group Therapy, Substance Abuse Treatment  Phone: [place] [phone] Fax: [phone] [address], [place], [place], [address] [place] [phone] [address], [address] [place], [place], [address]  [facility]  Medicaid Types of Program: SAIOP  Phone: [phone] Fax: [phone] [address] [place], [place], [place], [address] [facility] Medicaid/state funds Crisis line [phone] [facility] [phone] [place] [phone] [place] [phone]  [facility] [address];  [place], [place] [address] [phone]  Substance Abuse Intensive Outpatient Program [facility] [address] [place], [place] [address] [phone]- Substance abuse treatment  [facility]  Insurance: [facility], [address], sliding scale Types of Program: substance abuse treatment, transportation assistance Phone: [phone] Fax: [phone] Address: [address], [place] [address] [facility] ([email or web address]) Insurance: UHC, [place], [place], [place] Program: addiction counseling, detoxification,  Phone: [phone]  Fax: [phone] Address: [address] [place], [place] [address]  [facility] (statewide facilities/programs) [address] (Medicaid/state funds) [place], [place] [address]                      [email or web address] [phone] Marcy Panning- [phone] [place]- [phone] [facility] [place] (2 Locations) (Medicaid/state funds) --[address]  walk in 8:30-12 and 1-2:30 [place], [id number]   [facility]- [phone] --[address] [place], [place] [address]  [phone] walk in 8:30-12 and 2-3:30  [facility] state funds/medicaid [address] [place], [place] [address] [phone] [facility] (Suboxone clinic) Medicaid/state funds  [address]  [place], [place] [address] [phone]   [facility]  [address] [place], [place] [address]  [phone] (24 hours) [place]- [address] [place], [place] [address]  [phone] (24 hours) [place]- [address] Brooke Dare [phone] [place]- [address] Rosalita Levan [phone] [place]- [address]  [place] [phone] [facility]- Medicaid and state funds  [place]- [address]  [address], [place] [address] [phone] (24 hours) [place]- [address] [place] [address] [place], [place] [address] [phone] [facility]- [address] [place], [place] [address] [phone] (24 hours) [place] [address] [place], [place]  [address] [phone] [place]- [address] [place] [phone]    [facility] assistance programs.  Food pantry and assistance Some of the local food pantries and distribution centers to call for free food and groceries include [employer] [facility] [facility] [place] (phone [phone]), [facility] (phone [phone]) and also [employer]. Dial [phone].  Several other food banks in the region provide clothing, free food and meals, access to soup kitchens and other help. Find the addresses and phone numbers of more food pantries in [place]. http://www.needhelppayingbills.com/html/[place]_assistance_pro.html

## 2023-03-17 NOTE — Progress Notes (Signed)
 CSW added substance abuse resources to patient's AVS.  Edwin Dada, MSW, LCSW Transitions of Care  Clinical Social Worker II [phone]

## 2023-03-18 ENCOUNTER — Inpatient Hospital Stay (HOSPITAL_COMMUNITY): Payer: Medicare Other

## 2023-03-18 ENCOUNTER — Other Ambulatory Visit: Payer: Self-pay

## 2023-03-18 ENCOUNTER — Encounter (HOSPITAL_COMMUNITY): Payer: Self-pay | Admitting: Internal Medicine

## 2023-03-18 DIAGNOSIS — J9383 Other pneumothorax: Secondary | ICD-10-CM | POA: Diagnosis not present

## 2023-03-18 LAB — CBC WITH DIFFERENTIAL/PLATELET
Abs Immature Granulocytes: 0.01 10*3/uL (ref 0.00–0.07)
Basophils Absolute: 0 10*3/uL (ref 0.0–0.1)
Basophils Relative: 0 %
Eosinophils Absolute: 0.3 10*3/uL (ref 0.0–0.5)
Eosinophils Relative: 6 %
HCT: 42.2 % (ref 39.0–52.0)
Hemoglobin: 13.9 g/dL (ref 13.0–17.0)
Immature Granulocytes: 0 %
Lymphocytes Relative: 34 %
Lymphs Abs: 1.9 10*3/uL (ref 0.7–4.0)
MCH: 30.3 pg (ref 26.0–34.0)
MCHC: 32.9 g/dL (ref 30.0–36.0)
MCV: 91.9 fL (ref 80.0–100.0)
Monocytes Absolute: 0.6 10*3/uL (ref 0.1–1.0)
Monocytes Relative: 11 %
Neutro Abs: 2.7 10*3/uL (ref 1.7–7.7)
Neutrophils Relative %: 49 %
Platelets: 232 10*3/uL (ref 150–400)
RBC: 4.59 MIL/uL (ref 4.22–5.81)
RDW: 13.6 % (ref 11.5–15.5)
WBC: 5.6 10*3/uL (ref 4.0–10.5)
nRBC: 0 % (ref 0.0–0.2)

## 2023-03-18 LAB — BASIC METABOLIC PANEL
Anion gap: 9 (ref 5–15)
BUN: 22 mg/dL (ref 8–23)
CO2: 24 mmol/L (ref 22–32)
Calcium: 8.8 mg/dL — ABNORMAL LOW (ref 8.9–10.3)
Chloride: 99 mmol/L (ref 98–111)
Creatinine, Ser: 1.22 mg/dL (ref 0.61–1.24)
GFR, Estimated: 58 mL/min — ABNORMAL LOW (ref >60)
Glucose, Bld: 103 mg/dL — ABNORMAL HIGH (ref 70–99)
Potassium: 4.3 mmol/L (ref 3.5–5.1)
Sodium: 132 mmol/L — ABNORMAL LOW (ref 135–145)

## 2023-03-18 MED ORDER — ENSURE ENLIVE PO LIQD
237.0000 mL | Freq: Two times a day (BID) | ORAL | Status: DC
  Administered 2023-03-18 – 2023-03-20 (×5): 237 mL via ORAL

## 2023-03-18 NOTE — Progress Notes (Signed)
Mobility Specialist Progress Note:   03/18/23 1400  Mobility  Activity Ambulated with assistance in hallway  Level of Assistance Contact guard assist, steadying assist  Assistive Device Front wheel walker  Distance Ambulated (ft) 125 ft  Activity Response Tolerated well  Mobility Referral Yes  Mobility visit 1 Mobility  Mobility Specialist Start Time (ACUTE ONLY) 1400  Mobility Specialist Stop Time (ACUTE ONLY) 1420  Mobility Specialist Time Calculation (min) (ACUTE ONLY) 20 min   Pt agreeable to mobility session. Required only minG assist with RW. Attempted gait with no AD, however unsteady with multiple LOBs. Required 2LO2 to maintain SpO2 >88%. Pt c/o chest tube insertion site pain throughout, limited mobility. Pt back in chair with all needs met.   Addison Lank Mobility Specialist Please contact via SecureChat or  Rehab office at [phone]

## 2023-03-18 NOTE — Progress Notes (Signed)
   NAME:  Jeremy Boone, MRN:  [id number], DOB:  1934-08-23, LOS: 1 ADMISSION DATE:  03/16/2023, CONSULTATION DATE:  03/18/2023 REFERRING MD:  EDP, CHIEF COMPLAINT:  spontaneous pneumothorax   History of Present Illness:  Jeremy Boone is a 89 y.o. man with history of pneumothorax. Never smoker. Works in [employer] doing [employer] work. Does drink alcohol. Here with shortness of breath and chest pain since last night which worsened this morning. Symptoms similar to the spontanous ptx he had fall 2024. Denies trauma to the area. The dyspnea actually started when he woke up in the middle of the night to go to the bathroom.   Pertinent  Medical History  Spontanous ptx  Significant Hospital Events: Including procedures, antibiotic start and stop dates in addition to other pertinent events   1/14 admitted with ptx, pigtail placed in the ED.   Interim History / Subjective:  Post talc pleurodesis 03/17/2023  Objective   Blood pressure 137/81, pulse 74, temperature 98.5 F (36.9 C), temperature source Oral, resp. rate 15, height 5\' 11"  (1.803 m), weight 78.6 kg, SpO2 95%.        Intake/Output Summary (Last 24 hours) at 03/18/2023 1055 Last data filed at 03/18/2023 0955 Gross per 24 hour  Intake 120 ml  Output 2201 ml  Net -2081 ml   Filed Weights   03/16/23 2217 03/17/23 1623 03/18/23 0421  Weight: 79.4 kg 79.7 kg 78.6 kg    Examination: Elderly male in no acute distress No JVD or lymphadenopathy is appreciated Decreased breath sounds on the right chest tube is in place stopcock is open no airleak or drainage Heart sounds are regular Abdomen soft nontender Extremities warm  Resolved Hospital Problem list     Assessment & Plan:   Recurrent Spontaneous Pneumothorax, Right  Status post talc pleurodesis on 03/17/2023 03/18/2023 no airleak is noted There is no chest x-ray we will order 1 now Consider removing suction if no airleak and then considered discontinuing chest  tube     Brett Canales Zanyiah Posten ACNP Acute Care Nurse Practitioner [facility]/Critical [facility] Please consult Amion 03/18/2023, 10:56 AM

## 2023-03-18 NOTE — Plan of Care (Signed)

## 2023-03-18 NOTE — Progress Notes (Signed)
CSW reviewed SDOH needs for food and added food resources to pts AVS.   Johnnette Gourd, MSW, LCSWA Transitions of Care [phone]

## 2023-03-18 NOTE — TOC Initial Note (Signed)
Transition of Care [facility] - Initial/Assessment Note    Patient Details  Name: Jeremy Boone MRN: [id number] Date of Birth: 1934-05-31  Transition of [facility]) CM/SW Contact:    Leone Haven, RN Phone Number: 03/18/2023, 2:51 PM  Clinical Narrative:                 From home with spouse, has PCP and insurance on file, states has no HH services in place at this time , has cpap machine, and his wife has a walker and a cane at home.  States daughter or son  will transport him  home at [employer] and family is support system, states [facility] [address] Bales self ambulatory.  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  [facility] during the week and on the weekend they have somone to come in to take care of her also.   Expected Discharge Plan: Home/Self Care Barriers to Discharge: Continued Medical Work up   Patient Goals and CMS Choice Patient states their goals for this hospitalization and ongoing recovery are:: retur home   Choice offered to / list presented to : NA      Expected Discharge Plan and Services In-house Referral: NA Discharge Planning Services: CM Consult Post Acute Care Choice: NA Living arrangements for the past 2 months: Single Family Home                 DME Arranged: N/A DME Agency: NA       HH Arranged: NA          Prior Living Arrangements/Services Living arrangements for the past 2 months: Single Family Home Lives with:: Spouse Patient language and need for interpreter reviewed:: Yes Do you feel safe going back to the place where you live?: Yes      Need for Family Participation in Patient Care: No (Comment) Care giver support system in place?: Yes (comment) Current home services: DME (cpap, wife has walker, and a cane) Criminal Activity/Legal Involvement Pertinent to Current Situation/Hospitalization: No - Comment as needed  Activities of Daily Living   ADL  Screening (condition at time of admission) Independently performs ADLs?: Yes (appropriate for developmental age) Is the patient deaf or have difficulty hearing?: No Does the patient have difficulty seeing, even when wearing glasses/contacts?: No Does the patient have difficulty concentrating, remembering, or making decisions?: No  Permission Sought/Granted Permission sought to share information with : Case Manager Permission granted to share information with : Yes, Verbal Permission Granted              Emotional Assessment Appearance:: Appears stated age Attitude/Demeanor/Rapport: Engaged Affect (typically observed): Appropriate Orientation: : Oriented to Self, Oriented to Place, Oriented to  Time, Oriented to Situation Alcohol / Substance Use: Not Applicable Psych Involvement: No (comment)  Admission diagnosis:  Spontaneous pneumothorax [J93.83] Patient Active Problem List   Diagnosis Date Noted   Spontaneous pneumothorax 12/24/2022   Demand ischemia (HCC) 12/24/2022   Alcohol use 12/24/2022   Insomnia 06/27/2020   Statin myopathy 11/21/2019   Constipation 12/15/2017   OSA on CPAP 04/19/2015   Vitamin D deficiency 09/14/2013   Medication management 09/14/2013   History of colonic polyps 08/02/2013   Gastroesophageal reflux disease without esophagitis 08/02/2013   Hyperlipidemia, mixed    Essential hypertension    Idiopathic gout    Abnormal glucose    PCP:  [name], MD Pharmacy:  [facility] - [place], [place] - [address] [facility] [address] [place] [address] [place] [address] Phone: [phone] Fax: [phone]  [facility] [phone] [place], [place] - [address] [place] [address] Phone: [phone] Fax: [phone]     Social Drivers of Health (SDOH) Social History: SDOH Screenings   Food Insecurity: Food Insecurity Present (03/18/2023)  Housing: Low Risk  (03/18/2023)   Transportation Needs: No Transportation Needs (03/18/2023)  Utilities: Not At Risk (03/18/2023)  Depression (PHQ2-9): Low Risk  (03/11/2023)  Social Connections: Socially Integrated (03/18/2023)  Tobacco Use: Low Risk  (03/18/2023)   SDOH Interventions:     Readmission Risk Interventions     No data to display

## 2023-03-19 ENCOUNTER — Inpatient Hospital Stay (HOSPITAL_COMMUNITY): Payer: Medicare Other

## 2023-03-19 DIAGNOSIS — J9383 Other pneumothorax: Secondary | ICD-10-CM | POA: Diagnosis not present

## 2023-03-19 LAB — CBC WITH DIFFERENTIAL/PLATELET
Abs Immature Granulocytes: 0.02 10*3/uL (ref 0.00–0.07)
Basophils Absolute: 0 10*3/uL (ref 0.0–0.1)
Basophils Relative: 1 %
Eosinophils Absolute: 0.4 10*3/uL (ref 0.0–0.5)
Eosinophils Relative: 7 %
HCT: 39.1 % (ref 39.0–52.0)
Hemoglobin: 12.8 g/dL — ABNORMAL LOW (ref 13.0–17.0)
Immature Granulocytes: 0 %
Lymphocytes Relative: 30 %
Lymphs Abs: 1.7 10*3/uL (ref 0.7–4.0)
MCH: 30.1 pg (ref 26.0–34.0)
MCHC: 32.7 g/dL (ref 30.0–36.0)
MCV: 92 fL (ref 80.0–100.0)
Monocytes Absolute: 0.7 10*3/uL (ref 0.1–1.0)
Monocytes Relative: 12 %
Neutro Abs: 2.9 10*3/uL (ref 1.7–7.7)
Neutrophils Relative %: 50 %
Platelets: 211 10*3/uL (ref 150–400)
RBC: 4.25 MIL/uL (ref 4.22–5.81)
RDW: 13.6 % (ref 11.5–15.5)
WBC: 5.8 10*3/uL (ref 4.0–10.5)
nRBC: 0 % (ref 0.0–0.2)

## 2023-03-19 LAB — BASIC METABOLIC PANEL
Anion gap: 7 (ref 5–15)
BUN: 23 mg/dL (ref 8–23)
CO2: 27 mmol/L (ref 22–32)
Calcium: 8.9 mg/dL (ref 8.9–10.3)
Chloride: 100 mmol/L (ref 98–111)
Creatinine, Ser: 0.99 mg/dL (ref 0.61–1.24)
GFR, Estimated: >60 mL/min (ref >60)
Glucose, Bld: 104 mg/dL — ABNORMAL HIGH (ref 70–99)
Potassium: 4.4 mmol/L (ref 3.5–5.1)
Sodium: 134 mmol/L — ABNORMAL LOW (ref 135–145)

## 2023-03-19 MED ORDER — BISACODYL 10 MG RE SUPP
10.0000 mg | Freq: Once | RECTAL | Status: AC
  Administered 2023-03-19: 10 mg via RECTAL
  Filled 2023-03-19: qty 1

## 2023-03-19 MED ORDER — ENOXAPARIN SODIUM 40 MG/0.4ML IJ SOSY
40.0000 mg | PREFILLED_SYRINGE | Freq: Every day | INTRAMUSCULAR | Status: DC
  Administered 2023-03-19 – 2023-03-20 (×2): 40 mg via SUBCUTANEOUS
  Filled 2023-03-19 (×2): qty 0.4

## 2023-03-19 MED ORDER — CALCIUM CARBONATE ANTACID 500 MG PO CHEW
400.0000 mg | CHEWABLE_TABLET | Freq: Three times a day (TID) | ORAL | Status: DC
  Administered 2023-03-19 (×2): 400 mg via ORAL
  Filled 2023-03-19 (×2): qty 2

## 2023-03-19 NOTE — Progress Notes (Signed)
PROGRESS NOTE  Jeremy Boone GNF:[id number] DOB: April 15, 1934 DOA: 03/16/2023 PCP: [name], MD  Brief History   The patient is an 89 yr old man who presented to [facility] on 03/16/2023 with complaints of shortness of breath and chest pressure. The patient stated that the pain awoke him from sleep. As he went about his day the pain became more sever and he became acutely short of breath. He came to the ED and was found to have suffered a spontaneous pneumothorax.   Dr. Celine Mans was consulted and placed a chest tube in the patient to resolve the pneumothorax and then performed pleurodesis to prevent recurrence. The patient has had a previous occcurence of a spontaneous pneumothorax in September of 2024.  The patient has a past medical history significant for spontaneous pneumothorqax, OSA (not on CPAP). The patient does drink a couple of drinks a day, does not smoke, but has worked in [employer].  Chest tube remains in place and is functioning. Pain is improving.  A & P  * Spontaneous pneumothorax 89 year old presenting to ED with sudden onset shortness of breath and chest tightness last night found to have a spontaneous pneumothorax  -obs to progressive -on supplemental oxygen -pulm consulted -second pneumothorax since 12/2022. Did have f/u with pulm  -no hx of smoking, COPD.  -did cough last night and wore CPAP  -repeat CXR shows resolution  -Pt has undergone chest tube placement and pleurodesis with Dr. Celine Mans. -No leak will be placed to water seal today.   Essential hypertension Well controlled states he is no longer on novasc and reviewed home med list. Do not see this listed.  Monitor pressures    Hyperlipidemia, mixed Statin intolerant Continue his zetia and lopid    Gastroesophageal reflux disease without esophagitis Continue pepcid BID    Alcohol use Check ethanol level  Reports 2-3 beers/week CIWA protocol +nutriceuticals    Idiopathic gout Continue allopurinol     Insomnia Continue gabapentin PRN at night    OSA on CPAP Does use cpap Hold in setting of pneumothorax   Hyponatremia Mild with Na 132. Clinically insignificant.  I have seen and examined this patient myself. I have spent 34 minutes in his evaluation and care.  DVT prophylaxis: SCD's Code Status: Full Code Family Communication: None available Disposition Plan: home    Cletis Muma, DO [facility] Direct contact: see [email or web address]  7PM-7AM contact night coverage as above 03/19/2023, 9:50 AM  LOS: 2 days   Consultants  PCCM  Procedures  Chest tube placement Pleurodesis  Antibiotics   Anti-infectives (From admission, onward)    None        Interval History/Subjective  The patient is resting comfortably. He states that he is feeling better.  Objective   Vitals:  Vitals:   03/19/23 0508 03/19/23 0725  BP: 136/76 116/64  Pulse: 86 70  Resp: 20 13  Temp: 98.2 F (36.8 C) 98 F (36.7 C)  SpO2: 98% 97%    Exam:  Constitutional:  The patient is awake, alert, and oriented x 3. No acute distress. Respiratory:  No increased work of breathing. No wheezes, rales, or rhonchi No tactile fremitus Cardiovascular:  Regular rate and rhythm No murmurs, ectopy, or gallups. No lateral PMI. No thrills. Abdomen:  Abdomen is soft, non-tender, non-distended No hernias, masses, or organomegaly Normoactive bowel sounds.  Musculoskeletal:  No cyanosis, clubbing, or edema Skin:  No rashes, lesions, ulcers palpation of skin: no induration or nodules Neurologic:  CN 2-12 intact  Sensation all 4 extremities intact Psychiatric:  Mental status Mood, affect appropriate Orientation to person, place, time  judgment and insight appear intact   I have personally reviewed the following:   Today's Data   Vitals:   03/19/23 0508 03/19/23 0725  BP: 136/76 116/64  Pulse: 86 70  Resp: 20 13  Temp: 98.2 F (36.8 C) 98 F (36.7 C)  SpO2: 98% 97%     Lab Data   CBC    Component Value Date/Time   WBC 5.8 03/19/2023 0237   RBC 4.25 03/19/2023 0237   HGB 12.8 (L) 03/19/2023 0237   HCT 39.1 03/19/2023 0237   PLT 211 03/19/2023 0237   MCV 92.0 03/19/2023 0237   MCH 30.1 03/19/2023 0237   MCHC 32.7 03/19/2023 0237   RDW 13.6 03/19/2023 0237   LYMPHSABS 1.7 03/19/2023 0237   MONOABS 0.7 03/19/2023 0237   EOSABS 0.4 03/19/2023 0237   BASOSABS 0.0 03/19/2023 0237      Latest Ref Rng & Units 03/19/2023    2:37 AM 03/18/2023    2:34 AM 03/17/2023    3:54 AM  BMP  Glucose 70 - 99 mg/dL 409  811  914   BUN 8 - 23 mg/dL 23  22  20    Creatinine 0.61 - 1.24 mg/dL 7.82  9.56  2.13   Sodium 135 - 145 mmol/L 134  132  137   Potassium 3.5 - 5.1 mmol/L 4.4  4.3  3.8   Chloride 98 - 111 mmol/L 100  99  102   CO2 22 - 32 mmol/L 27  24  27    Calcium 8.9 - 10.3 mg/dL 8.9  8.8  9.0      Micro Data   Results for orders placed or performed during the hospital encounter of 12/24/22  Resp panel by RT-PCR (RSV, Flu A&B, Covid) Anterior Nasal Swab     Status: None   Collection Time: 12/24/22  7:33 AM   Specimen: Anterior Nasal Swab  Result Value Ref Range Status   SARS Coronavirus 2 by RT PCR NEGATIVE NEGATIVE Final   Influenza A by PCR NEGATIVE NEGATIVE Final   Influenza B by PCR NEGATIVE NEGATIVE Final    Comment: (NOTE) The Xpert Xpress SARS-CoV-2/FLU/RSV plus assay is intended as an aid in the diagnosis of influenza from Nasopharyngeal swab specimens and should not be used as a sole basis for treatment. Nasal washings and aspirates are unacceptable for Xpert Xpress SARS-CoV-2/FLU/RSV testing.  Fact Sheet for Patients: BloggerCourse.com  Fact Sheet for Healthcare Providers: SeriousBroker.it  This test is not yet approved or cleared by the [place] FDA and has been authorized for detection and/or diagnosis of SARS-CoV-2 by FDA under an Emergency Use Authorization (EUA). This EUA will  remain in effect (meaning this test can be used) for the duration of the COVID-19 declaration under Section 564(b)(1) of the Act, 21 U.S.C. section 360bbb-3(b)(1), unless the authorization is terminated or revoked.     Resp Syncytial Virus by PCR NEGATIVE NEGATIVE Final    Comment: (NOTE) Fact Sheet for Patients: BloggerCourse.com  Fact Sheet for Healthcare Providers: SeriousBroker.it  This test is not yet approved or cleared by the [place] FDA and has been authorized for detection and/or diagnosis of SARS-CoV-2 by FDA under an Emergency Use Authorization (EUA). This EUA will remain in effect (meaning this test can be used) for the duration of the COVID-19 declaration under Section 564(b)(1) of the Act, 21 U.S.C. section 360bbb-3(b)(1), unless the authorization is  terminated or revoked.  Performed at [facility], [address], [place], [place] [address]   MRSA Next Gen by PCR, Nasal     Status: None   Collection Time: 12/25/22 11:11 AM   Specimen: Nasal Mucosa; Nasal Swab  Result Value Ref Range Status   MRSA by PCR Next Gen NOT DETECTED NOT DETECTED Final    Comment: (NOTE) The GeneXpert MRSA Assay (FDA approved for NASAL specimens only), is one component of a comprehensive MRSA colonization surveillance program. It is not intended to diagnose MRSA infection nor to guide or monitor treatment for MRSA infections. Test performance is not FDA approved in patients less than 87 years old. Performed at [facility], [address], [place], [place] [address]      Imaging  CXR  Scheduled Meds:  allopurinol  150 mg Oral Daily   ezetimibe  10 mg Oral Daily   famotidine  20 mg Oral BID   feeding supplement  237 mL Oral BID BM   folic acid  1 mg Oral Daily   gabapentin  300 mg Oral QHS   gemfibrozil  600 mg Oral BID AC   guaiFENesin  1,200 mg Oral BID   multivitamin with minerals  1 tablet Oral Daily    sodium chloride flush  10 mL Intrapleural Q8H   thiamine  100 mg Oral Daily   Or   thiamine  100 mg Intravenous Daily   Continuous Infusions:  Principal Problem:   Spontaneous pneumothorax Active Problems:   Essential hypertension   Hyperlipidemia, mixed   Gastroesophageal reflux disease without esophagitis   Alcohol use   Idiopathic gout   Insomnia   OSA on CPAP   LOS: 2 days

## 2023-03-19 NOTE — Progress Notes (Signed)
PROGRESS NOTE    Jeremy Boone  YQI:[id number] DOB: 08/07/1934 DOA: 03/16/2023 PCP: [name], MD   Brief Narrative:    The patient is an 89 yr old man who presented to [facility] on 03/16/2023 with complaints of shortness of breath and chest pressure. The patient stated that the pain awoke him from sleep. As he went about his day the pain became more sever and he became acutely short of breath. He came to the ED and was found to have suffered a spontaneous pneumothorax.    Dr. Celine Mans was consulted and placed a chest tube in the patient to resolve the pneumothorax and then performed pleurodesis to prevent recurrence. The patient has had a previous occcurence of a spontaneous pneumothorax in September of 2024.   The patient has a past medical history significant for spontaneous pneumothorqax, OSA (not on CPAP). The patient does drink a couple of drinks a day, does not smoke, but has worked in [employer].   Chest tube remains in place and is functioning. Pain is improving. PCCM planning to potentially remove chest tube today.  Assessment & Plan:   Principal Problem:   Spontaneous pneumothorax Active Problems:   Essential hypertension   Hyperlipidemia, mixed   Gastroesophageal reflux disease without esophagitis   Alcohol use   Idiopathic gout   Insomnia   OSA on CPAP  Assessment and Plan:  Spontaneous pneumothorax 89 year old presenting to ED with sudden onset shortness of breath and chest tightness last night found to have a spontaneous pneumothorax  -obs to progressive -on supplemental oxygen -pulm consulted -second pneumothorax since 12/2022. Did have f/u with pulm  -no hx of smoking, COPD.  -did cough last night and wore CPAP  -repeat CXR shows resolution  -Pt has undergone chest tube placement and pleurodesis with Dr. Celine Mans. -PCCM to potentially remove tube today.   Essential hypertension Well controlled states he is no longer on novasc and reviewed home med list. Do not  see this listed.  Monitor pressures    Hyperlipidemia, mixed Statin intolerant Continue his zetia and lopid    Gastroesophageal reflux disease without esophagitis Continue pepcid BID    Alcohol use Check ethanol level  Reports 2-3 beers/week CIWA protocol +nutriceuticals    Idiopathic gout Continue allopurinol    Insomnia Continue gabapentin PRN at night    OSA on CPAP Does use cpap Hold in setting of pneumothorax    Hyponatremia Mild with Na 134. Clinically insignificant.  DVT prophylaxis:Lovenox Code Status: Full Family Communication: None Disposition Plan:  Status is: Inpatient Remains inpatient appropriate because: Need for close monitoring.   Consultants:  PCCM  Procedures:  None  Antimicrobials:  None   Subjective: Patient seen and evaluated today with no new acute complaints or concerns. No acute concerns or events noted overnight.  Objective: Vitals:   03/19/23 0003 03/19/23 0508 03/19/23 0725 03/19/23 1135  BP: 129/63 136/76 116/64 (!) 145/68  Pulse: 72 86 70   Resp: 18 20 13 18   Temp: 98.6 F (37 C) 98.2 F (36.8 C) 98 F (36.7 C) 98 F (36.7 C)  TempSrc: Oral Oral Oral Oral  SpO2: 98% 98% 97%   Weight:  81.9 kg    Height:        Intake/Output Summary (Last 24 hours) at 03/19/2023 1539 Last data filed at 03/19/2023 0844 Gross per 24 hour  Intake 250 ml  Output 1290 ml  Net -1040 ml   Filed Weights   03/17/23 1623 03/18/23 0421 03/19/23 3875  Weight: 79.7 kg 78.6 kg 81.9 kg    Examination:  General exam: Appears calm and comfortable  Respiratory system: Clear to auscultation. Respiratory effort normal. R side chest tube. Cardiovascular system: S1 & S2 heard, RRR.  Gastrointestinal system: Abdomen is soft Central nervous system: Alert and awake Extremities: No edema Skin: No significant lesions noted Psychiatry: Flat affect.    Data Reviewed: I have personally reviewed following labs and imaging studies  CBC: Recent  Labs  Lab 03/16/23 1356 03/18/23 0234 03/19/23 0237  WBC 5.2 5.6 5.8  NEUTROABS 2.1 2.7 2.9  HGB 14.9 13.9 12.8*  HCT 45.4 42.2 39.1  MCV 93.0 91.9 92.0  PLT 274 232 211   Basic Metabolic Panel: Recent Labs  Lab 03/16/23 1356 03/17/23 0354 03/18/23 0234 03/19/23 0237  NA 138 137 132* 134*  K 4.1 3.8 4.3 4.4  CL 104 102 99 100  CO2 23 27 24 27   GLUCOSE 84 113* 103* 104*  BUN 15 20 22 23   CREATININE 0.96 1.14 1.22 0.99  CALCIUM 9.1 9.0 8.8* 8.9   GFR: Estimated Creatinine Clearance: 56 mL/min (by C-G formula based on SCr of 0.99 mg/dL). Liver Function Tests: No results for input(s): "AST", "ALT", "ALKPHOS", "BILITOT", "PROT", "ALBUMIN" in the last 168 hours. No results for input(s): "LIPASE", "AMYLASE" in the last 168 hours. No results for input(s): "AMMONIA" in the last 168 hours. Coagulation Profile: No results for input(s): "INR", "PROTIME" in the last 168 hours. Cardiac Enzymes: No results for input(s): "CKTOTAL", "CKMB", "CKMBINDEX", "TROPONINI" in the last 168 hours. BNP (last 3 results) No results for input(s): "PROBNP" in the last 8760 hours. HbA1C: No results for input(s): "HGBA1C" in the last 72 hours. CBG: No results for input(s): "GLUCAP" in the last 168 hours. Lipid Profile: No results for input(s): "CHOL", "HDL", "LDLCALC", "TRIG", "CHOLHDL", "LDLDIRECT" in the last 72 hours. Thyroid Function Tests: No results for input(s): "TSH", "T4TOTAL", "FREET4", "T3FREE", "THYROIDAB" in the last 72 hours. Anemia Panel: No results for input(s): "VITAMINB12", "FOLATE", "FERRITIN", "TIBC", "IRON", "RETICCTPCT" in the last 72 hours. Sepsis Labs: No results for input(s): "PROCALCITON", "LATICACIDVEN" in the last 168 hours.  No results found for this or any previous visit (from the past 240 hours).       Radiology Studies: DG CHEST PORT 1 VIEW Result Date: 03/19/2023 CLINICAL DATA:  Follow-up pneumothorax EXAM: PORTABLE CHEST 1 VIEW COMPARISON:  03/18/2023  FINDINGS: Right chest tube remains in place. No visible pleural air. Continued volume loss of the right middle lobe and lower lobe as seen yesterday. There is only mild atelectasis at the left lung base. IMPRESSION: Right chest tube remains in place. No visible pleural air. Continued volume loss of the right middle lobe and lower lobe. Electronically Signed   By: Paulina Fusi M.D.   On: 03/19/2023 11:21   DG CHEST PORT 1 VIEW Result Date: 03/18/2023 CLINICAL DATA:  Chest tube EXAM: PORTABLE CHEST 1 VIEW COMPARISON:  X-ray 03/16/2023 and older FINDINGS: Stable right sided pigtail catheter. Increasing opacity of the lung bases, right-greater-than-left but there is worsening inflation. These could be atelectasis. Recommend close follow-up. No pneumothorax or edema. Stable cardiopericardial silhouette with calcified tortuous aorta. Overlapping cardiac leads. Degenerative changes of the spine and shoulders. IMPRESSION: Worsening inflation with increasing lung base opacities. Atelectasis is favored. Recommend follow-up. Pigtail catheter in place in the right lung base. No definite pneumothorax seen by today's x-ray Electronically Signed   By: Karen Kays M.D.   On: 03/18/2023 15:41  Scheduled Meds:  allopurinol  150 mg Oral Daily   calcium carbonate  400 mg of elemental calcium Oral TID WC   enoxaparin (LOVENOX) injection  40 mg Subcutaneous Daily   ezetimibe  10 mg Oral Daily   famotidine  20 mg Oral BID   feeding supplement  237 mL Oral BID BM   folic acid  1 mg Oral Daily   gabapentin  300 mg Oral QHS   gemfibrozil  600 mg Oral BID AC   guaiFENesin  1,200 mg Oral BID   multivitamin with minerals  1 tablet Oral Daily   sodium chloride flush  10 mL Intrapleural Q8H   thiamine  100 mg Oral Daily   Or   thiamine  100 mg Intravenous Daily     LOS: 2 days    Time spent: 35 minutes    Jaina Morin Hoover Brunette, DO [facility]  If 7PM-7AM, please contact  night-coverage [email or web address] 03/19/2023, 3:39 PM

## 2023-03-19 NOTE — Progress Notes (Signed)
Mobility Specialist Progress Note:   03/19/23 1200  Mobility  Activity Ambulated with assistance in hallway  Level of Assistance Contact guard assist, steadying assist  Assistive Device Front wheel walker  Distance Ambulated (ft) 125 ft  Activity Response Tolerated well  Mobility Referral Yes  Mobility visit 1 Mobility  Mobility Specialist Start Time (ACUTE ONLY) 1200  Mobility Specialist Stop Time (ACUTE ONLY) 1215  Mobility Specialist Time Calculation (min) (ACUTE ONLY) 15 min   Pt agreeable to mobility session. Required only contact G to stand and ambulate with RW. Pt tends to have an anterior lean upon standing, able to recover with light contact assist. Continues to be limited by chest tube insertion site pain. No other complaints throughout. Pt back in bed with all needs met, alarm on.   Addison Lank Mobility Specialist Please contact via SecureChat or  Rehab office at [phone]

## 2023-03-19 NOTE — Progress Notes (Signed)
   NAME:  Jeremy Boone, MRN:  [id number], DOB:  1934/10/14, LOS: 2 ADMISSION DATE:  03/16/2023, CONSULTATION DATE:  03/19/2023 REFERRING MD:  EDP, CHIEF COMPLAINT:  spontaneous pneumothorax   History of Present Illness:  Trip Butcher is a 89 y.o. man with history of pneumothorax. Never smoker. Works in [employer] doing [employer] work. Does drink alcohol. Here with shortness of breath and chest pain since last night which worsened this morning. Symptoms similar to the spontanous ptx he had fall 2024. Denies trauma to the area. The dyspnea actually started when he woke up in the middle of the night to go to the bathroom.   Pertinent  Medical History  Spontanous ptx  Significant Hospital Events: Including procedures, antibiotic start and stop dates in addition to other pertinent events   1/14 admitted with ptx, pigtail placed in the ED.   Interim History / Subjective:  Post talc pleurodesis 03/17/2023 Consider removing chest tube on 03/19/2023  Objective   Blood pressure 116/64, pulse 70, temperature 98 F (36.7 C), temperature source Oral, resp. rate 13, height 5\' 11"  (1.803 m), weight 81.9 kg, SpO2 97%.        Intake/Output Summary (Last 24 hours) at 03/19/2023 1124 Last data filed at 03/19/2023 0844 Gross per 24 hour  Intake 470 ml  Output 1290 ml  Net -820 ml   Filed Weights   03/17/23 1623 03/18/23 0421 03/19/23 0508  Weight: 79.7 kg 78.6 kg 81.9 kg    Examination: Elderly male no acute distress Somewhat demented Lungs are clear Heart sounds are distant Abdomen is soft Right chest tube to waterseal without air leak Extremities warm without edema Resolved Hospital Problem list     Assessment & Plan:   Recurrent Spontaneous Pneumothorax, Right  Currently chest tube under waterseal and is status post talc pleurodesis No airleak considered is continuing chest tube at this time.     Brett Canales Kyiah Canepa ACNP Acute Care Nurse Practitioner [facility]  [facility] Please consult Amion 03/19/2023, 11:24 AM

## 2023-03-19 NOTE — Plan of Care (Signed)

## 2023-03-20 ENCOUNTER — Inpatient Hospital Stay (HOSPITAL_COMMUNITY): Payer: Medicare Other

## 2023-03-20 DIAGNOSIS — J9383 Other pneumothorax: Secondary | ICD-10-CM | POA: Diagnosis not present

## 2023-03-20 LAB — MAGNESIUM: Magnesium: 2.1 mg/dL (ref 1.7–2.4)

## 2023-03-20 LAB — CBC
HCT: 38.7 % — ABNORMAL LOW (ref 39.0–52.0)
Hemoglobin: 13 g/dL (ref 13.0–17.0)
MCH: 30.7 pg (ref 26.0–34.0)
MCHC: 33.6 g/dL (ref 30.0–36.0)
MCV: 91.3 fL (ref 80.0–100.0)
Platelets: 212 10*3/uL (ref 150–400)
RBC: 4.24 MIL/uL (ref 4.22–5.81)
RDW: 13.5 % (ref 11.5–15.5)
WBC: 6 10*3/uL (ref 4.0–10.5)
nRBC: 0 % (ref 0.0–0.2)

## 2023-03-20 LAB — BASIC METABOLIC PANEL
Anion gap: 8 (ref 5–15)
BUN: 17 mg/dL (ref 8–23)
CO2: 27 mmol/L (ref 22–32)
Calcium: 9.2 mg/dL (ref 8.9–10.3)
Chloride: 100 mmol/L (ref 98–111)
Creatinine, Ser: 0.9 mg/dL (ref 0.61–1.24)
GFR, Estimated: >60 mL/min (ref >60)
Glucose, Bld: 108 mg/dL — ABNORMAL HIGH (ref 70–99)
Potassium: 4.2 mmol/L (ref 3.5–5.1)
Sodium: 135 mmol/L (ref 135–145)

## 2023-03-20 MED ORDER — HYDROCODONE-ACETAMINOPHEN 5-325 MG PO TABS: 1.0000 | ORAL_TABLET | Freq: Four times a day (QID) | ORAL | 0 refills | Status: AC | PRN

## 2023-03-20 NOTE — Evaluation (Signed)
Physical Therapy Evaluation Patient Details Name: Jeremy Boone MRN: [id number] DOB: June 18, 1934 Today's Date: 03/20/2023  History of Present Illness  89 y.o. male presents to [facility] 03/16/23 w/ SOB and chest pressure. Pt admitted w/ spontaneous ptx. PMHx: HTN, GERD, alcohol use, idiopathic gout, prediabetes   Clinical Impression  Pt in bed upon arrival and agreeable to PT eval. Prior to admit, pt occasionally used a SP cane for mobility. Pt reports decreased balance prior to admit to hospital. In today's session, pt was able to ambulate ~160 ft with RW and supervision for safety. Pt scored 19/24 for DGI indicating a high falls risk. Pt presents to therapy session with decreased balance and mobility. Pt would benefit from acute skilled PT to address functional impairments. Recommending post-acute OP PT to work on improving balance and reduce falls risk. Acute PT to follow.          If plan is discharge home, recommend the following: A little help with walking and/or transfers;A little help with bathing/dressing/bathroom   Can travel by private vehicle  Yes    Equipment Recommendations None recommended by PT     Functional Status Assessment Patient has had a recent decline in their functional status and demonstrates the ability to make significant improvements in function in a reasonable and predictable amount of time.     Precautions / Restrictions Precautions Precautions: Fall Restrictions Weight Bearing Restrictions Per Provider Order: No      Mobility  Bed Mobility Overal bed mobility: Modified Independent     Transfers Overall transfer level: Needs assistance Equipment used: Rolling walker (2 wheels) Transfers: Sit to/from Stand Sit to Stand: Supervision    General transfer comment: supervision for safety    Ambulation/Gait Ambulation/Gait assistance: Supervision Gait Distance (Feet): 160 Feet Assistive device: Rolling walker (2 wheels) Gait Pattern/deviations:  Step-through pattern, Shuffle Gait velocity: decr     General Gait Details: steady w/ no LOB during dynamic gait challenges       Balance Overall balance assessment: Needs assistance, Mild deficits observed, not formally tested, History of Falls Sitting-balance support: No upper extremity supported, Feet supported Sitting balance-Leahy Scale: Good     Standing balance support: Bilateral upper extremity supported, During functional activity, Reliant on assistive device for balance Standing balance-Leahy Scale: Poor Standing balance comment: reliant on RW      Standardized Balance Assessment Standardized Balance Assessment : Dynamic Gait Index   Dynamic Gait Index Level Surface: Mild Impairment Change in Gait Speed: Mild Impairment Gait with Horizontal Head Turns: Mild Impairment Gait with Vertical Head Turns: Mild Impairment Gait and Pivot Turn: Normal Step Over Obstacle: Normal Step Around Obstacles: Normal Steps: Mild Impairment Total Score: 19       Pertinent Vitals/Pain Pain Assessment Pain Assessment: No/denies pain    Home Living Family/patient expects to be discharged to:: Private residence Living Arrangements: Spouse/significant other Available Help at Discharge: Family;Available 24 hours/day Type of Home: House Home Access: Stairs to enter Entrance Stairs-Rails: Left Entrance Stairs-Number of Steps: 3-4   Home Layout: Two level;Able to live on main level with bedroom/bathroom Home Equipment: Rolling Walker (2 wheels);Cane - single point;Wheelchair - manual 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    Prior Function Prior Level of Function : Independent/Modified Independent;Driving      Mobility Comments: occasionally uses SP cane ADLs Comments: independent     Extremity/Trunk Assessment   Upper Extremity Assessment Upper Extremity Assessment: Overall WFL for tasks assessed    Lower Extremity Assessment Lower Extremity Assessment:  Overall [facility] for tasks assessed    Cervical / Trunk Assessment Cervical / Trunk Assessment: Normal  Communication   Communication Communication: No apparent difficulties Cueing Techniques: Verbal cues  Cognition Arousal: Alert Behavior During Therapy: WFL for tasks assessed/performed Overall Cognitive Status: Within Functional Limits for tasks assessed         General Comments General comments (skin integrity, edema, etc.): VSS on RA     PT Assessment Patient needs continued PT services  PT Problem List Decreased strength;Decreased activity tolerance;Decreased balance;Decreased mobility       PT Treatment Interventions DME instruction;Gait training;Stair training;Functional mobility training;Therapeutic activities;Therapeutic exercise;Balance training;Neuromuscular re-education;Patient/family education    PT Goals (Current goals can be found in the Care Plan section)  Acute Rehab PT Goals Patient Stated Goal: to work on balance PT Goal Formulation: With patient Time For Goal Achievement: 04/03/23 Potential to Achieve Goals: Good    Frequency Min 1X/week        AM-PAC PT "6 Clicks" Mobility  Outcome Measure Help needed turning from your back to your side while in a flat bed without using bedrails?: None Help needed moving from lying on your back to sitting on the side of a flat bed without using bedrails?: None Help needed moving to and from a bed to a chair (including a wheelchair)?: A Little Help needed standing up from a chair using your arms (e.g., wheelchair or bedside chair)?: A Little Help needed to walk in hospital room?: A Little   6 Click Score: 17    End of Session Equipment Utilized During Treatment: Gait belt Activity Tolerance: Patient tolerated treatment well Patient left: in chair;with call bell/phone within reach Nurse Communication: Mobility status PT Visit Diagnosis: Unsteadiness on feet (R26.81)    Time: 6045-4098 PT Time Calculation (min)  (ACUTE ONLY): 17 min   Charges:   PT Evaluation $PT Eval Low Complexity: 1 Low   PT General Charges $$ ACUTE PT VISIT: 1 Visit         Hilton Cork, PT, DPT Secure Chat Preferred  Rehab Office [phone]   Arturo Morton Brion [employer] 03/20/2023, 8:42 AM

## 2023-03-20 NOTE — Discharge Summary (Signed)
Physician Discharge Summary  Jeylan Arrizon ZOX:[id number] DOB: 31-Dec-1934 DOA: 03/16/2023  PCP: [name], MD  Admit date: 03/16/2023 Discharge date: 03/20/2023  Admitted From: Home Disposition: Home  Recommendations for Outpatient Follow-up:  Follow up with PCP in 1-2 weeks Outpatient follow-up with pulmonology Maintain dressing in place to previous chest tube site x 7 days, avoid getting wet/soiled  Home Health: Outpatient PT Equipment/Devices: None  Discharge Condition: Stable CODE STATUS: Full code Diet recommendation: Heart healthy diet  History of present illness:  Jeremy Boone is a 89 year old male with past medical history significant for HTN, HLD, gout, EtOH, OSA not on CPAP, history of spontaneous pneumothorax roughly 1 year prior use who presented to [facility] ED on 1/14 via EMS from home for shortness of breath and chest pressure.  Patient reports the pain awoke him from sleep and as he went throughout the day symptoms became more severe in which he became acutely short of breath.  In the ED, patient was found to have a recurrent spontaneous pneumothorax.  PCCM was consulted and chest tube was placed.  TRH was consulted for admission for further evaluation and management of recurrent spontaneous pneumothorax.  Hospital course:  Spontaneous pneumothorax, recurrent Patient presenting to ED with progressive shortness of breath associated with chest pressure.  Imaging notable for right-sided pneumothorax.  PCCM was consulted and chest tube was placed at time of admission.  Patient underwent pleurodesis on 03/17/2023.  Chest tube was removed on 03/19/2023 and follow-up x-ray at 03/20/2023 shows continued resolution of pneumothorax.  Outpatient follow-up with pulmonology.  Essential hypertension Continue amlodipine 2.5 mg p.o. daily.  Continue aspirin  Dyslipidemia Continue gemfibrozil 600 mg p.o. twice daily, Zetia 10 mg p.o. daily  GERD Continue famotidine 20 mg p.o.  twice daily  Gout Continue allopurinol 150 mg p.o. daily  Discharge Diagnoses:  Principal Problem:   Spontaneous pneumothorax Active Problems:   Essential hypertension   Hyperlipidemia, mixed   Gastroesophageal reflux disease without esophagitis   Alcohol use   Idiopathic gout   Insomnia   OSA on CPAP    Discharge Instructions  Discharge Instructions     Ambulatory referral to Pulmonology   Complete by: As directed    Recurrent pneumothorax   Reason for referral: Other   Call MD for:  difficulty breathing, headache or visual disturbances   Complete by: As directed    Call MD for:  extreme fatigue   Complete by: As directed    Call MD for:  persistant dizziness or light-headedness   Complete by: As directed    Call MD for:  persistant nausea and vomiting   Complete by: As directed    Call MD for:  severe uncontrolled pain   Complete by: As directed    Call MD for:  temperature >100.4   Complete by: As directed    Diet - low sodium heart healthy   Complete by: As directed    Discharge wound care:   Complete by: As directed    Maintain dressing over previous chest tube site and leave in place for 7 days, avoid getting wet.  If becomes soiled or wet recommend replace.   Increase activity slowly   Complete by: As directed       Allergies as of 03/20/2023       Reactions   Ace Inhibitors Cough   Lipitor [atorvastatin]    Elevates CPK and Aldolase        Medication List     TAKE these medications  allopurinol 300 MG tablet Commonly known as: ZYLOPRIM Take  1 tablet  Daily for For Gout Prevention                                                                /                                                                   TAKE                                         BY                                                 MOUTH   amLODipine 2.5 MG tablet Commonly known as: NORVASC Take 1 tablet (2.5 mg total) by mouth daily.   aspirin EC 81 MG  tablet Take 81 mg by mouth daily. Patient was told to start taking per Dr, but Patient hasn't started 03/16/2023.   ezetimibe 10 MG tablet Commonly known as: ZETIA TAKE 1 TABLET BY MOUTH DAILY FOR CHOLESTEROL What changed: See the new instructions.   famotidine 20 MG tablet Commonly known as: PEPCID TAKE 1 TABLET BY MOUTH TWICE  DAILY BEFORE BREAKFAST AND  SUPPER TO PREVENT HEARTBURN AND  INDIGESTION   furosemide 40 MG tablet Commonly known as: Lasix Take 1 tablet Daily as needed for  Fluid Retention / Ankle Swelling   gabapentin 300 MG capsule Commonly known as: NEURONTIN TAKE 1 TO 2 CAPSULES BY MOUTH 1  TO 2 HOURS BEFORE BEDTIME AS  NEEDED FOR SLEEP   gemfibrozil 600 MG tablet Commonly known as: LOPID TAKE 1 TABLET BY MOUTH TWICE  DAILY WITH MEALS FOR CHOLESTEROL   HYDROcodone-acetaminophen 5-325 MG tablet Commonly known as: NORCO/VICODIN Take 1 tablet by mouth every 6 (six) hours as needed for up to 5 days for moderate pain (pain score 4-6).   oxybutynin 10 MG 24 hr tablet Commonly known as: DITROPAN-XL Take  1 tablet Daily for Bladder                                                                 /                                                     TAKE  BY                                                 MOUTH What changed:  how much to take how to take this when to take this reasons to take this   thiamine 100 MG tablet Commonly known as: Vitamin B-1 Take 1 tablet (100 mg total) by mouth daily.   triamcinolone 55 MCG/ACT Aero nasal inhaler Commonly known as: NASACORT Place 2 sprays into the nose at bedtime. What changed:  when to take this reasons to take this   VITAMIN D PO Take 5,000 Int'l Units by mouth 2 (two) times daily.               Discharge Care Instructions  (From admission, onward)           Start     Ordered   03/20/23 0000  Discharge wound care:       Comments: Maintain dressing over previous chest tube site  and leave in place for 7 days, avoid getting wet.  If becomes soiled or wet recommend replace.   03/20/23 1238            Follow-up Information     [name], MD. Schedule an appointment as soon as possible for a visit in 1 week(s).   Specialty: Internal Medicine Contact information: [address] [place] [address] [phone]                Allergies  Allergen Reactions   Ace Inhibitors Cough   Lipitor [Atorvastatin]     Elevates CPK and Aldolase    Consultations: PCCM   Procedures/Studies: DG CHEST PORT 1 VIEW Result Date: 03/20/2023 CLINICAL DATA:  Follow-up pneumothorax. EXAM: PORTABLE CHEST 1 VIEW COMPARISON:  03/19/2023, 03/18/2023 FINDINGS: Interval removal of right chest tube. Lungs are hypoinflated with mild elevation of the right hemidiaphragm. Persistent bibasilar opacification right worse than left likely atelectasis, although developing infection is possible. No pneumothorax visualized. Cardiomediastinal silhouette and remainder of the exam is unchanged. IMPRESSION: 1. Interval removal of right chest tube. No pneumothorax visualized. 2. Hypoinflation with persistent bibasilar opacification right worse than left likely atelectasis, although developing infection is possible. Electronically Signed   By: Elberta Fortis M.D.   On: 03/20/2023 12:03   DG CHEST PORT 1 VIEW Result Date: 03/19/2023 CLINICAL DATA:  Follow-up pneumothorax EXAM: PORTABLE CHEST 1 VIEW COMPARISON:  03/18/2023 FINDINGS: Right chest tube remains in place. No visible pleural air. Continued volume loss of the right middle lobe and lower lobe as seen yesterday. There is only mild atelectasis at the left lung base. IMPRESSION: Right chest tube remains in place. No visible pleural air. Continued volume loss of the right middle lobe and lower lobe. Electronically Signed   By: Paulina Fusi M.D.   On: 03/19/2023 11:21   DG CHEST PORT 1 VIEW Result Date: 03/18/2023 CLINICAL  DATA:  Chest tube EXAM: PORTABLE CHEST 1 VIEW COMPARISON:  X-ray 03/16/2023 and older FINDINGS: Stable right sided pigtail catheter. Increasing opacity of the lung bases, right-greater-than-left but there is worsening inflation. These could be atelectasis. Recommend close follow-up. No pneumothorax or edema. Stable cardiopericardial silhouette with calcified tortuous aorta. Overlapping cardiac leads. Degenerative changes of the spine and shoulders. IMPRESSION: Worsening inflation with increasing lung base opacities. Atelectasis is favored. Recommend follow-up. Pigtail catheter in  place in the right lung base. No definite pneumothorax seen by today's x-ray Electronically Signed   By: Karen Kays M.D.   On: 03/18/2023 15:41   DG Chest Portable 1 View Result Date: 03/16/2023 CLINICAL DATA:  Status post chest tube. EXAM: PORTABLE CHEST 1 VIEW COMPARISON:  03/16/2023. FINDINGS: There is complete resolution of right pneumothorax, status post right-sided pleural drainage catheter placement. There are nonspecific but predominantly linear opacities overlying the right mid lower lung zones, which may represent underlying atelectasis and or scarring. There are probable atelectatic changes at the left lung base. Bilateral lung fields are otherwise clear. There is blunting of right lateral costophrenic angle, which may represent trace right pleural effusion versus pleural thickening. Left lateral costophrenic angle is clear. Stable cardio-mediastinal silhouette. No acute osseous abnormalities. The soft tissues are within normal limits. IMPRESSION: *Complete resolution of right pneumothorax status post right-sided pleural drainage catheter placement. *Atelectasis and/or scarring overlying the right mid lower lung zones and patchy atelectatic changes at the left lung base. Electronically Signed   By: Jules Schick M.D.   On: 03/16/2023 15:26   DG Chest 2 View Result Date: 03/16/2023 CLINICAL DATA:  Shortness of breath with  chest pressure and hypoxemia. History of pneumothorax. EXAM: CHEST - 2 VIEW COMPARISON:  Radiographs 02/03/2023 and 12/28/2022.  CT 02/03/2023. FINDINGS: There is a recurrent large right-sided pneumothorax (more than 75%). The right lung is nearly fully collapse, although there is no mediastinal shift. The left lung is clear. No significant pleural effusion. The heart size and mediastinal contours are stable with aortic atherosclerosis. No acute fractures are identified. There are degenerative changes in the spine and both shoulders. IMPRESSION: Recurrent large right-sided pneumothorax with near complete collapse of the right lung. No mediastinal shift to suggest tension physiology at this time. However, based on the size of this pneumothorax, chest tube placement likely indicated. Critical Value/emergent results were called by telephone at the time of interpretation on 03/16/2023 at 12:30 pm to representative of provider JOSEPH STEVENS Jerene Pitch, PA), who verbally acknowledged these results. He reports that the clinical team is aware of the pneumothorax. Electronically Signed   By: Carey Bullocks M.D.   On: 03/16/2023 12:34     Subjective: Patient seen examined at bedside, resting comfortably.  Sitting in bedside chair.  Chest tube removed yesterday, repeat chest x-ray this morning continues to show expanded the lung fields.  Seen by PCCM yesterday and okay for discharge home if no further pneumothorax.  Will refer to pulmonology outpatient for further monitoring/surveillance.  No other specific questions, concerns or complaints at this time.  Denies headache, no dizziness, no chest pain, no palpitations, no shortness of breath, no abdominal pain, no fever/chills/night sweats, no nausea/vomiting/diarrhea, no focal weakness, no fatigue, no paresthesias.  No acute events overnight per nursing staff.  Discharge Exam: Vitals:   03/20/23 0720 03/20/23 1120  BP: (!) 144/70 (!) 154/76  Pulse: 79 65  Resp: 17 18   Temp: 97.8 F (36.6 C) 97.7 F (36.5 C)  SpO2: 97% 96%   Vitals:   03/20/23 0053 03/20/23 0405 03/20/23 0720 03/20/23 1120  BP: 133/69 139/76 (!) 144/70 (!) 154/76  Pulse:  76 79 65  Resp:  14 17 18   Temp: 98.9 F (37.2 C) 98.6 F (37 C) 97.8 F (36.6 C) 97.7 F (36.5 C)  TempSrc: Oral Oral Oral Oral  SpO2:   97% 96%  Weight:  80.1 kg    Height:  Physical Exam: GEN: NAD, alert and oriented x 3, chronically ill/elderly in appearance HEENT: NCAT, PERRL, EOMI, sclera clear, MMM PULM: CTAB w/o wheezes/crackles, normal respiratory effort, on room air CV: RRR w/o M/G/R; noted dressing overlying previous right sided chest tube site GI: abd soft, NTND, NABS, no R/G/M MSK: no peripheral edema, muscle strength globally intact 5/5 bilateral upper/lower extremities NEURO: CN II-XII intact, no focal deficits, sensation to light touch intact PSYCH: normal mood/affect Integumentary: dry/intact, no rashes or wounds    The results of significant diagnostics from this hospitalization (including imaging, microbiology, ancillary and laboratory) are listed below for reference.     Microbiology: No results found for this or any previous visit (from the past 240 hours).   Labs: BNP (last 3 results) Recent Labs    12/24/22 0733 03/16/23 1356  BNP 21.8 19.5   Basic Metabolic Panel: Recent Labs  Lab 03/16/23 1356 03/17/23 0354 03/18/23 0234 03/19/23 0237 03/20/23 0220  NA 138 137 132* 134* 135  K 4.1 3.8 4.3 4.4 4.2  CL 104 102 99 100 100  CO2 23 27 24 27 27   GLUCOSE 84 113* 103* 104* 108*  BUN 15 20 22 23 17   CREATININE 0.96 1.14 1.22 0.99 0.90  CALCIUM 9.1 9.0 8.8* 8.9 9.2  MG  --   --   --   --  2.1   Liver Function Tests: No results for input(s): "AST", "ALT", "ALKPHOS", "BILITOT", "PROT", "ALBUMIN" in the last 168 hours. No results for input(s): "LIPASE", "AMYLASE" in the last 168 hours. No results for input(s): "AMMONIA" in the last 168 hours. CBC: Recent  Labs  Lab 03/16/23 1356 03/18/23 0234 03/19/23 0237 03/20/23 0220  WBC 5.2 5.6 5.8 6.0  NEUTROABS 2.1 2.7 2.9  --   HGB 14.9 13.9 12.8* 13.0  HCT 45.4 42.2 39.1 38.7*  MCV 93.0 91.9 92.0 91.3  PLT 274 232 211 212   Cardiac Enzymes: No results for input(s): "CKTOTAL", "CKMB", "CKMBINDEX", "TROPONINI" in the last 168 hours. BNP: Invalid input(s): "POCBNP" CBG: No results for input(s): "GLUCAP" in the last 168 hours. D-Dimer No results for input(s): "DDIMER" in the last 72 hours. Hgb A1c No results for input(s): "HGBA1C" in the last 72 hours. Lipid Profile No results for input(s): "CHOL", "HDL", "LDLCALC", "TRIG", "CHOLHDL", "LDLDIRECT" in the last 72 hours. Thyroid function studies No results for input(s): "TSH", "T4TOTAL", "T3FREE", "THYROIDAB" in the last 72 hours.  Invalid input(s): "FREET3" Anemia work up No results for input(s): "VITAMINB12", "FOLATE", "FERRITIN", "TIBC", "IRON", "RETICCTPCT" in the last 72 hours. Urinalysis    Component Value Date/Time   COLORURINE STRAW (A) 12/24/2022 0752   APPEARANCEUR CLEAR 12/24/2022 0752   LABSPEC 1.011 12/24/2022 0752   PHURINE 6.0 12/24/2022 0752   GLUCOSEU NEGATIVE 12/24/2022 0752   HGBUR SMALL (A) 12/24/2022 0752   BILIRUBINUR NEGATIVE 12/24/2022 0752   KETONESUR NEGATIVE 12/24/2022 0752   PROTEINUR 100 (A) 12/24/2022 0752   NITRITE NEGATIVE 12/24/2022 0752   LEUKOCYTESUR NEGATIVE 12/24/2022 0752   Sepsis Labs Recent Labs  Lab 03/16/23 1356 03/18/23 0234 03/19/23 0237 03/20/23 0220  WBC 5.2 5.6 5.8 6.0   Microbiology No results found for this or any previous visit (from the past 240 hours).   Time coordinating discharge: Over 30 minutes  SIGNED:   Alvira Philips [place], DO  [facility] 03/20/2023, 12:48 PM

## 2023-03-20 NOTE — TOC Transition Note (Signed)
Transition of Care [facility]) - Discharge Note   Patient Details  Name: Jeremy Boone MRN: [id number] Date of Birth: 01-05-1935  [facility]) CM/SW Contact:  Lawerance Sabal, RN Phone Number: 03/20/2023, 1:28 PM   Clinical Narrative:     Referral placed to [facility] for OP PT for balance training.     Barriers to Discharge: Continued Medical Work up   Patient Goals and CMS Choice Patient states their goals for this hospitalization and ongoing recovery are:: retur home   Choice offered to / list presented to : NA      Discharge Placement                       Discharge Plan and Services Additional resources added to the After Visit Summary for   In-house Referral: NA Discharge Planning Services: CM Consult Post Acute Care Choice: NA          DME Arranged: N/A DME Agency: NA       HH Arranged: NA          Social Drivers of Health (SDOH) Interventions SDOH Screenings   Food Insecurity: Food Insecurity Present (03/18/2023)  Housing: Low Risk  (03/18/2023)  Transportation Needs: No Transportation Needs (03/18/2023)  Utilities: Not At Risk (03/18/2023)  Depression (PHQ2-9): Low Risk  (03/11/2023)  Social Connections: Socially Integrated (03/18/2023)  Tobacco Use: Low Risk  (03/18/2023)     Readmission Risk Interventions     No data to display

## 2023-03-22 ENCOUNTER — Telehealth: Payer: Self-pay | Admitting: *Deleted

## 2023-03-22 ENCOUNTER — Other Ambulatory Visit: Payer: Self-pay

## 2023-03-22 ENCOUNTER — Telehealth: Payer: Self-pay | Admitting: Internal Medicine

## 2023-03-22 DIAGNOSIS — I10 Essential (primary) hypertension: Secondary | ICD-10-CM

## 2023-03-22 NOTE — Consult Note (Signed)
Value-Based Care [facility] Liaison Consult Note    03/22/2023  Jeremy Boone [birth year] [id number]  Referral:  Post hospital needs  Insurance: [employer]   Primary Care Provider: [name], MD with [facility], this provider is listed for the transition of care follow up appointments however doe office does not have VBCI for [facility] follow up calls   [facility] Liaison reviewed for post hospital needs.   The patient was screened for 4 day  hospitalization with noted SDOH but showing low risk score noted during hospital admissions. Patient with 2 hospitalizations in 6 months with spontaneous pneumothorax noted.  The patient was assessed for potential Community Care Coordination service needs for post hospital transition for care coordination. Review of patient's electronic medical record reveals patient is siting food insecurity and caregiver for wife.  Plan: [facility] Liaison will request for community care coordination:for post hospital follow up.   VBCI Community Care, Population Health does not replace or interfere with any arrangements made by the Inpatient Transition of Care team.   For questions contact:   Charlesetta Shanks, RN, BSN, CCM [place]  [facility], [facility] Liaison Direct Dial: [phone] or secure chat Email: [email or web address]

## 2023-03-22 NOTE — Telephone Encounter (Signed)
Scheduled to see ND on 2/20. Notes indicate CXR is required before visit. Nothing further needed.

## 2023-03-22 NOTE — Telephone Encounter (Signed)
Hospital follow up for pneumothorax needed.

## 2023-03-22 NOTE — Telephone Encounter (Signed)
Already taken care of. ND messaged front desk about him too. He is scheduled for 2/19. Appointment notes indicate he needs CXR prior to being seen.

## 2023-03-22 NOTE — Telephone Encounter (Signed)
-----   Message from Lynnell Catalan sent at 03/19/2023  4:53 PM EST ----- Regarding: IP follow-up Please arrange follow up with dr Celine Mans in next 4 weeks. Needs CXR on day of visit.

## 2023-03-23 ENCOUNTER — Encounter: Payer: Self-pay | Admitting: Cardiology

## 2023-03-23 ENCOUNTER — Telehealth: Payer: Self-pay | Admitting: *Deleted

## 2023-03-23 ENCOUNTER — Ambulatory Visit: Payer: Medicare Other | Attending: Cardiology | Admitting: Cardiology

## 2023-03-23 VITALS — BP 138/85 | HR 66 | Ht 71.0 in | Wt 177.0 lb

## 2023-03-23 DIAGNOSIS — M7989 Other specified soft tissue disorders: Secondary | ICD-10-CM | POA: Diagnosis not present

## 2023-03-23 DIAGNOSIS — I10 Essential (primary) hypertension: Secondary | ICD-10-CM | POA: Diagnosis not present

## 2023-03-23 DIAGNOSIS — R0989 Other specified symptoms and signs involving the circulatory and respiratory systems: Secondary | ICD-10-CM

## 2023-03-23 DIAGNOSIS — J9383 Other pneumothorax: Secondary | ICD-10-CM

## 2023-03-23 DIAGNOSIS — I350 Nonrheumatic aortic (valve) stenosis: Secondary | ICD-10-CM

## 2023-03-23 DIAGNOSIS — T466X5D Adverse effect of antihyperlipidemic and antiarteriosclerotic drugs, subsequent encounter: Secondary | ICD-10-CM | POA: Diagnosis not present

## 2023-03-23 DIAGNOSIS — I2489 Other forms of acute ischemic heart disease: Secondary | ICD-10-CM

## 2023-03-23 DIAGNOSIS — G4733 Obstructive sleep apnea (adult) (pediatric): Secondary | ICD-10-CM | POA: Diagnosis not present

## 2023-03-23 DIAGNOSIS — R002 Palpitations: Secondary | ICD-10-CM

## 2023-03-23 DIAGNOSIS — E782 Mixed hyperlipidemia: Secondary | ICD-10-CM

## 2023-03-23 DIAGNOSIS — G720 Drug-induced myopathy: Secondary | ICD-10-CM | POA: Diagnosis not present

## 2023-03-23 NOTE — Patient Instructions (Addendum)
Medication Instructions:   No changes  *If you need a refill on your cardiac medications before your next appointment, please call your pharmacy*   Lab Work:  Not needed   Testing/Procedures:  Will be schedule at [address] Your physician has requested that you have a  bilateralcarotid duplex. This test is an ultrasound of the carotid arteries in your neck. It looks at blood flow through these arteries that supply the brain with blood. Allow one hour for this exam. There are no restrictions or special instructions.   Follow-Up: At [facility], you and your health needs are our priority.  As part of our continuing mission to provide you with exceptional heart care, we have created designated Provider Care Teams.  These Care Teams include your primary Cardiologist (physician) and Advanced Practice Providers (APPs -  Physician Assistants and Nurse Practitioners) who all work together to provide you with the care you need, when you need it.     Your next appointment:   6 month(s)  The format for your next appointment:   In Person  Provider:   Edd Fabian, FNP   .   Other Instructions

## 2023-03-23 NOTE — Progress Notes (Signed)
Complex Care Management Note Care Guide Note  03/23/2023 Name: Jeremy Boone MRN: [id number] DOB: [birth year]   Complex Care Management Outreach Attempts: An unsuccessful telephone outreach was attempted today to offer the patient information about available complex care management services.  Follow Up Plan:  Additional outreach attempts will be made to offer the patient complex care management information and services.   Encounter Outcome:  No Answer  Gwenevere Ghazi  [facility]  [facility], [facility] Medical Guide  Direct Dial: [phone]  Fax [phone]

## 2023-03-23 NOTE — Progress Notes (Signed)
Cardiology Office Note:  .   Date:  03/27/2023  ID:  Jeremy Boone, DOB 01/13/1935, MRN [id number] PCP: [name], MD  [facility] Providers Cardiologist:  Bryan Lemma, MD     Chief Complaint  Patient presents with   Hospitalization Follow-up    Spontaneous pneumothorax in October 2024 with elevated troponin levels.  Had a recurrent spontaneous pneumothorax in January with normal troponins. Echo normal.    Patient Profile: .     Jeremy Boone is a  89 y.o. male  with a PMH notable for HTN, HLD, OSA on CPAP with recent history of spontaneous right-sided pneumothorax who presents here for hospital follow-up at the request of [name], MD.  PMH: hypertension, hyperlipidemia, and OSA-on CPAP. spontaneous R sided pneumothorax in October 2024 with elevated troponin thought to be demand ischemia.    Jeremy Boone was last seen on March 17, 2016 in follow-up for palpitations, hypertension hyperlipidemia with paroxysmal dyspnea-=> He noted having some mild exertional dyspnea if he overdid it but otherwise doing well.  Somewhat difficult catching his breath if he puts himself.  Maybe little fatigue as well but no chest pain or pressure.  Minimal palpitations.  No syncope or near syncope.  We discussed possibly checking a stress test but he was not convinced.  He had previously opted out of going for an echocardiogram as well.  Plan was PRN follow-up.  He was admitted on 12/24/2022 - 12/26/2022.  He was diagnosed with right sided spontaneous pneumothorax.Echocardiogram at that time showed no regional wall motion abnormalities, mild aortic stenosis Cardiology was consulted for evaluation of elevated troponin.  Dr. Scharlene Gloss saw and examined the patient.   His EKG showed LVH.  It was felt that his symptoms were related to his pneumothorax.  It was felt that his increase in cardiac troponins was due to/secondary to demand ischemia.  He was started on aspirin He reported a  statin allergy.  His ezetimibe was continued (along with gemfibrozil.  , => cardiology follow-up was planned in 4 to 6 weeks.  (He never did start taking Zetia) He went back into the ER on 02/03/2023 with right-sided back pain, and then was readmitted on March 16, 2023 for recurrent spontaneous pneumothorax.  On this occurrence, he did not have elevated troponin levels.  PCCM placed a chest tube.   Subjective  Discussed the use of AI scribe software for clinical note transcription with the patient, who gave verbal consent to proceed.  History of Present Illness   The patient, an 89 year old with a history of aortic stenosis and recent hospitalization for a collapsed lung, presents for a follow-up visit. He reports occasional chest tightness and rapid heart rate, particularly when active. However, these symptoms are not consistent and do not occur all the time. The patient also reports feeling his heart "pumping hard" at times.  The patient experienced one episode of chest tightness post-hospitalization but could not recall the specific circumstances. He also reports feeling his heart beating fast when moving or trying to do something, but not when at rest.  The chest pain was Right-Sided - near the Chest Tube insertion site.   The patient sleeps on one pillow and uses a CPAP machine for sleep apnea. He reports an episode of struggling to breathe while using the CPAP machine, after which he stopped using it. He was advised not to use the CPAP machine for another two to three weeks to allow the lung to re-expand.  The patient reports occasional  swelling in one leg, which is managed with as-needed medication for fluid retention. He is currently on amlodipine for blood pressure, Lopid and Zetia for cholesterol, and a baby aspirin.  Despite these health issues, the patient remains active and continues to work as a Chiropractor. He reports being able to go up and down flights of stairs and perform his work  without significant problems. He did experience shortness of breath after work one day, but this was after his recent hospitalization. Occasional palpitations, but nothing prolonged or irregular rhythms.  Since recent discharge, no syncope/near syncope or TIA/CVA/ amaurosis fugax.         Objective   Current Meds  Medication Sig   amLODipine (NORVASC) 2.5 MG tablet Take 1 tablet (2.5 mg total) by mouth daily.   aspirin EC 81 MG tablet Take 81 mg by mouth daily. Patient was told to start taking per Dr, but Patient hasn't started 03/16/2023.   Cholecalciferol (VITAMIN D PO) Take 5,000 Int'l Units by mouth 2 (two) times daily.   ezetimibe (ZETIA) 10 MG tablet - NOT TAKING TAKE 1 TABLET BY MOUTH DAILY FOR CHOLESTEROL (Patient taking differently: Take 10 mg by mouth daily. TAKE 1 TABLET BY MOUTH  DAILY FOR CHOLESTEROL)   famotidine (PEPCID) 20 MG tablet TAKE 1 TABLET BY MOUTH TWICE  DAILY BEFORE BREAKFAST AND  SUPPER TO PREVENT HEARTBURN AND  INDIGESTION   furosemide (LASIX) 40 MG tablet Take 1 tablet Daily as needed for  Fluid Retention / Ankle Swelling   gabapentin (NEURONTIN) 300 MG capsule TAKE 1 TO 2 CAPSULES BY MOUTH 1  TO 2 HOURS BEFORE BEDTIME AS  NEEDED FOR SLEEP   gemfibrozil (LOPID) 600 MG tablet TAKE 1 TABLET BY MOUTH TWICE  DAILY WITH MEALS FOR CHOLESTEROL   thiamine (VITAMIN B-1) 100 MG tablet Take 1 tablet (100 mg total) by mouth daily.    Studies Reviewed: Marland Kitchen        ECHO 12/25/2022: Normal LV size and function.  Hyperdynamic with an EF of> 75%.  Mild basal septal hypertrophy.  Indeterminate diastolic parameters but no RWMA.  Mildly elevated PAP.  Trivial MR.  Moderate AoV calcification/thickening with mild AS  Lab Results  Component Value Date   CHOL 212 (H) 03/11/2023   HDL 104 03/11/2023   LDLCALC 92 03/11/2023   TRIG 75 03/11/2023   CHOLHDL 2.0 03/11/2023     Risk Assessment/Calculations:    Physical Exam:   VS:  BP 138/85 (Cuff Size: Normal)   Pulse 66   Ht 5'  11" (1.803 m)   Wt 177 lb (80.3 kg)   SpO2 100%   BMI 24.69 kg/m    Wt Readings from Last 3 Encounters:  03/23/23 177 lb (80.3 kg)  03/20/23 176 lb 9.4 oz (80.1 kg)  03/11/23 179 lb 9.6 oz (81.5 kg)    GEN: Well nourished, well groomed in no acute distress; healthy-appearing.  Looks younger than stated age. NECK: No JVD; left carotid bruit (versus radiated murmur) CARDIAC: RRR, Normal S1, S2; soft 1/6 SEM at RUSB.  Otherwise no murmurs, rubs, gallops RESPIRATORY:  Clear to auscultation without rales, wheezing or rhonchi ; nonlabored, good air movement.;  Tenderness palpation on the right lateral rib cage. ABDOMEN: Soft, non-tender, non-distended EXTREMITIES:  No edema; No deformity     ASSESSMENT AND PLAN: .    Problem List Items Addressed This Visit       Cardiology Problems   Demand ischemia (HCC) - Primary (Chronic)   Elevated troponin  several months ago while in the hospital in the setting of pneumothorax.  No further anginal symptoms. We talked again about whether or not we should do any further testing and he seems to be relatively happy with his lack of symptoms and not interested in proceeding with further studies.  Simply continue to treat cardiac risk factors -Continue aspirin and Lopid along with amlodipine. -He never started taking Zetia.  Will defer management to PCP.      Essential hypertension (Chronic)   Initial blood pressure slightly elevated, currently on Amlodipine 2.5mg . -Consider increasing Amlodipine dose if blood pressure remains elevated.      Hyperlipidemia, mixed (Chronic)   Currently on Lopid for cholesterol management. ->  Not currently taking Zetia. -Continue current management and monitor lipid levels. If lipid levels to go up, would probably recommend he does start Zetia, but for now I think he is fine on current meds. Given his advanced age, he is not taking Dr. Nanetta Batty medications.      Mild aortic stenosis by prior echocardiogram  (Chronic)   No symptoms of chest pain or shortness of breath with daily activities. Echocardiogram showed mild aortic stenosis with a mean gradient of . -Continue current management and monitor for any new symptoms. -Consider reassessing echo in 2 to 3 years        Other   Left carotid bruit   Auscultation revealed possible carotid bruit, no history of stroke-like symptoms. This could potentially be AS murmur radiating to the neck.   However given his advanced age, recommend that we assess for carotid stenosis. -Order carotid artery ultrasound to assess for stenosis.      Relevant Orders   VAS US CAROTID (Completed)   Leg swelling   Reports mild swelling in legs, managed with as-needed diuretic. -Continue current management and monitor for any new symptoms.      OSA on CPAP (Chronic)   Follow-up pulmonary medicine to discuss restarting with recent recurrent pneumothorax      Palpitations (Chronic)   Reports feeling heart beating fast when moving around, but no symptoms at rest. -Monitor for any new symptoms.      Spontaneous pneumothorax (Chronic)   Continue to follow-up with pulmonary medicine.  I suspect this may be potentially related to a ruptured bleb with CPAP. No current respiratory symptoms. -Continue current management and monitor for any new symptoms.      Statin myopathy (Chronic)   Intolerant of statins.  If his lipids go the wrong direction, would recommend that he takes Zetia and potentially even use Nexlizet.  Relatively well-controlled now.             Follow-Up: Return in about 6 months (around 09/20/2023) for Alternate 6 month follow-up with APP & MD. -> with Edd Fabian, NP if any new symptoms of chest discomfort, palpitations, or shortness of breath occur, patient to notify office.      Signed, Marykay Lex, MD, MS Bryan Lemma, M.D., M.S. Interventional Cardiologist  [facility]  Pager # [phone] Phone #  [phone] [address] [place], [place] [address]

## 2023-03-25 NOTE — Progress Notes (Signed)
Complex Care Management Note Care Guide Note  03/25/2023 Name: Jeremy Boone MRN: [id number] DOB: May 21, 1934   Complex Care Management Outreach Attempts: A second unsuccessful outreach was attempted today to offer the patient with information about available complex care management services.  Follow Up Plan:  Additional outreach attempts will be made to offer the patient complex care management information and services.   Encounter Outcome:  No Answer  Gwenevere Ghazi  [facility]  [facility], [facility] Guide  Direct Dial: [phone]  Fax [phone]

## 2023-03-26 ENCOUNTER — Telehealth: Payer: Self-pay | Admitting: *Deleted

## 2023-03-26 ENCOUNTER — Ambulatory Visit (HOSPITAL_COMMUNITY)
Admission: RE | Admit: 2023-03-26 | Discharge: 2023-03-26 | Disposition: A | Discharge status: Home / Self Care | Payer: Medicare Other | Source: Ambulatory Visit | Attending: Cardiology | Admitting: Cardiology

## 2023-03-26 DIAGNOSIS — R0989 Other specified symptoms and signs involving the circulatory and respiratory systems: Secondary | ICD-10-CM

## 2023-03-26 NOTE — Progress Notes (Signed)
Complex Care Management Note  Care Guide Note 03/26/2023 Name: Torrian Canion MRN: [id number] DOB: August 13, 1934  Myka Lukins is a 89 y.o. year old male who sees [name], MD for primary care. I reached out to [facility] by phone today to offer complex care management services.  Mr. Colin was given information about Complex Care Management services today including:   The Complex Care Management services include support from the care team which includes your Nurse Coordinator, Clinical Social Worker, or Pharmacist.  The Complex Care Management team is here to help remove barriers to the health concerns and goals most important to you. Complex Care Management services are voluntary, and the patient may decline or stop services at any time by request to their care team member.   Complex Care Management Consent Status: Patient agreed to services and verbal consent obtained.   Follow up plan:  Telephone appointment with complex care management team member scheduled for:  2/4  Encounter Outcome:  Patient Scheduled  Gwenevere Ghazi  [facility]  [facility], [facility] Guide  Direct Dial: [phone]  Fax [phone]

## 2023-03-26 NOTE — Progress Notes (Signed)
Complex Care Management Note Care Guide Note  03/26/2023 Name: Conrado Nance MRN: [id number] DOB: 1934-09-22   Complex Care Management Outreach Attempts: An unsuccessful telephone outreach was attempted today to offer the patient information about available complex care management services.  Follow Up Plan:  Additional outreach attempts will be made to offer the patient complex care management information and services.   Encounter Outcome:  no answer  [facility] Care Guide  Direct [phone] [phone] Website: [email or web address]

## 2023-03-27 ENCOUNTER — Encounter: Payer: Self-pay | Admitting: Cardiology

## 2023-03-27 DIAGNOSIS — M7989 Other specified soft tissue disorders: Secondary | ICD-10-CM | POA: Insufficient documentation

## 2023-03-27 DIAGNOSIS — R0989 Other specified symptoms and signs involving the circulatory and respiratory systems: Secondary | ICD-10-CM | POA: Insufficient documentation

## 2023-03-27 NOTE — Assessment & Plan Note (Signed)
Currently on Lopid for cholesterol management. ->  Not currently taking Zetia. -Continue current management and monitor lipid levels. If lipid levels to go up, would probably recommend he does start Zetia, but for now I think he is fine on current meds. Given his advanced age, he is not taking Dr. Nanetta Batty medications.

## 2023-03-27 NOTE — Assessment & Plan Note (Signed)
Continue to follow-up with pulmonary medicine.  I suspect this may be potentially related to a ruptured bleb with CPAP. No current respiratory symptoms. -Continue current management and monitor for any new symptoms.

## 2023-03-27 NOTE — Assessment & Plan Note (Signed)
Intolerant of statins.  If his lipids go the wrong direction, would recommend that he takes Zetia and potentially even use Nexlizet.  Relatively well-controlled now.

## 2023-03-27 NOTE — Assessment & Plan Note (Signed)
Follow-up pulmonary medicine to discuss restarting with recent recurrent pneumothorax

## 2023-03-27 NOTE — Assessment & Plan Note (Signed)
No symptoms of chest pain or shortness of breath with daily activities. Echocardiogram showed mild aortic stenosis with a mean gradient of . -Continue current management and monitor for any new symptoms. -Consider reassessing echo in 2 to 3 years

## 2023-03-27 NOTE — Assessment & Plan Note (Signed)
Initial blood pressure slightly elevated, currently on Amlodipine 2.5mg . -Consider increasing Amlodipine dose if blood pressure remains elevated.

## 2023-03-27 NOTE — Assessment & Plan Note (Signed)
Reports feeling heart beating fast when moving around, but no symptoms at rest. -Monitor for any new symptoms.

## 2023-03-27 NOTE — Assessment & Plan Note (Signed)
Auscultation revealed possible carotid bruit, no history of stroke-like symptoms. This could potentially be AS murmur radiating to the neck.   However given his advanced age, recommend that we assess for carotid stenosis. -Order carotid artery ultrasound to assess for stenosis.

## 2023-03-27 NOTE — Assessment & Plan Note (Signed)
Elevated troponin several months ago while in the hospital in the setting of pneumothorax.  No further anginal symptoms. We talked again about whether or not we should do any further testing and he seems to be relatively happy with his lack of symptoms and not interested in proceeding with further studies.  Simply continue to treat cardiac risk factors -Continue aspirin and Lopid along with amlodipine. -He never started taking Zetia.  Will defer management to PCP.

## 2023-03-27 NOTE — Assessment & Plan Note (Signed)
Reports mild swelling in legs, managed with as-needed diuretic. -Continue current management and monitor for any new symptoms.

## 2023-03-29 ENCOUNTER — Telehealth: Payer: Self-pay | Admitting: *Deleted

## 2023-03-29 NOTE — Progress Notes (Signed)
Complex Care Management Note Care Guide Note  03/29/2023 Name: Jeremy Boone MRN: [id number] DOB: August 01, 1934   Complex Care Management Outreach Attempts: An unsuccessful telephone outreach was attempted today to offer the patient information about available complex care management services.  Follow Up Plan:  Additional outreach attempts will be made to offer the patient complex care management information and services.   Encounter Outcome:  No Answer  [facility] Care Guide  Direct Dial:[phone] Fax:[phone] Website: [email or web address]

## 2023-03-31 ENCOUNTER — Telehealth: Payer: Self-pay | Admitting: *Deleted

## 2023-03-31 NOTE — Progress Notes (Signed)
Complex Care Management Note Care Guide Note  03/31/2023 Name: Jeremy Boone MRN: [id number] DOB: May 30, 1934   Complex Care Management Outreach Attempts: A third unsuccessful outreach was attempted today to offer the patient with information about available complex care management services.  Follow Up Plan:  No further outreach attempts will be made at this time. We have been unable to contact the patient to offer or enroll patient in complex care management services.  Encounter Outcome:  No Answer  [facility] Care Guide  Direct Dial:[phone] Fax:[phone] Website: [email or web address]

## 2023-04-05 ENCOUNTER — Inpatient Hospital Stay: Payer: Medicare Other | Admitting: Nurse Practitioner

## 2023-04-06 ENCOUNTER — Ambulatory Visit: Payer: Self-pay

## 2023-04-06 NOTE — Patient Instructions (Signed)
 Visit Information  Thank you for taking time to visit with me today. Please don't hesitate to contact me if I can be of assistance to you.   Following are the goals we discussed today:   Goals Addressed             This Visit's Progress    Keep BP and sugars controlled       Care Coordination Interventions: Provided education to patient about basic DM disease process Reviewed medications with patient and discussed importance of medication adherence Evaluation of current treatment plan related to hypertension self management and patient's adherence to plan as established by provider Provided education to patient re: stroke prevention, s/s of heart attack and stroke   Spoke with patient. He reports is better from his hospitalizations for pneumothorax. He lives in the home with spouse who has dementia.  She goes to the [employer] during the day but he cares for her otherwise and sometimes is stressful.They do have children as well who assist as they can.  Discussed pneumothorax and when to notify physician.  He states he has had follow up with pulmonology.  He has another follow up 04-21-23. He also reports some HTN and prediabetes and wants to keep those under control.  Discussed HTN and prediabetes management.  He is agreeable to RN CM follow up for education and support related to HTN and prediabetes.  No RN CM concerns.    RN CM noted he did have a referral for food resources.  He states that he should have called back but he has not.  He asked for number to call back.  Contact information for care guide given and explained that she will give resources for food.  He verbalized understanding and states he will call back.  .           Our next appointment is by telephone on 05/04/23 at 100 pm  Please call the care guide team at [phone] if you need to cancel or reschedule your appointment.   If you are experiencing a Mental Health or Behavioral Health Crisis or need someone  to talk to, please call the Suicide and Crisis Lifeline: 988   Patient verbalizes understanding of instructions and care plan provided today and agrees to view in MyChart. Active MyChart status and patient understanding of how to access instructions and care plan via MyChart confirmed with patient.     The patient has been provided with contact information for the care management team and has been advised to call with any health related questions or concerns.   Lumir Demetriou J. Dariush Mcnellis RN, MSN [facility], [facility] Health RN Care Manager Direct Dial: [phone]  Fax: [phone] Website: [email or web address]

## 2023-04-06 NOTE — Patient Outreach (Signed)
  Care Coordination   Initial Visit Note   04/06/2023 Name: Jeremy Boone MRN: [id number] DOB: 01-23-1935  Jeremy Boone is a 89 y.o. year old male who sees Jeremy Fallow, MD for primary care. I spoke with  Jeremy Boone by phone today.  What matters to the patients health and wellness today?  Control HTN and prediabetes    Goals Addressed             This Visit's Progress    Keep BP and sugars controlled       Care Coordination Interventions: Provided education to patient about basic DM disease process Reviewed medications with patient and discussed importance of medication adherence Evaluation of current treatment plan related to hypertension self management and patient's adherence to plan as established by provider Provided education to patient re: stroke prevention, s/s of heart attack and stroke   Spoke with patient. He reports is better from his hospitalizations for pneumothorax. He lives in the home with spouse who has dementia.  She goes to the [employer] during the day but he cares for her otherwise and sometimes is stressful.They do have children as well who assist as they can.  Discussed pneumothorax and when to notify physician.  He states he has had follow up with pulmonology.  He has another follow up 04-21-23. He also reports some HTN and prediabetes and wants to keep those under control.  Discussed HTN and prediabetes management.  He is agreeable to RN CM follow up for education and support related to HTN and prediabetes.  No RN CM concerns.    RN CM noted he did have a referral for food resources.  He states that he should have called back but he has not.  He asked for number to call back.  Contact information for care guide given and explained that she will give resources for food.  He verbalized understanding and states he will call back.  .           SDOH assessments and interventions completed:  Yes  SDOH Interventions Today    Flowsheet Row  Most Recent Value  SDOH Interventions   Food Insecurity Interventions Other (Comment)  [Resource careguide referral done]  Housing Interventions Intervention Not Indicated  Transportation Interventions Intervention Not Indicated  Utilities Interventions Intervention Not Indicated  Health Literacy Interventions Intervention Not Indicated        Care Coordination Interventions:  Yes, provided   Follow up plan: Follow up call scheduled for March    Encounter Outcome:  Patient Visit Completed   Jeremy Boone J. Case Vassell RN, MSN [facility], [facility] Health RN Care Manager Direct Dial: [phone]  Fax: [phone] Website: [email or web address]

## 2023-04-07 ENCOUNTER — Ambulatory Visit: Payer: Medicare Other | Admitting: Physician Assistant

## 2023-04-16 ENCOUNTER — Other Ambulatory Visit: Payer: Self-pay

## 2023-04-16 ENCOUNTER — Telehealth: Payer: Self-pay | Admitting: *Deleted

## 2023-04-16 DIAGNOSIS — E782 Mixed hyperlipidemia: Secondary | ICD-10-CM

## 2023-04-16 MED ORDER — GEMFIBROZIL 600 MG PO TABS: ORAL_TABLET | ORAL | 0 refills | Status: DC

## 2023-04-16 NOTE — Progress Notes (Signed)
Complex Care Management Note Care Guide Note  04/16/2023 Name: [name] MRN: [id number] DOB: [birth year]  Jeremy Boone is a 89 y.o. year old male who is a primary care patient of [name], MD . The community resource team was consulted for assistance with Food Insecurity  SDOH screenings and interventions completed:  Yes        Care guide performed the following interventions: Patient provided with information about care guide support team and interviewed to confirm resource needs. Sent food banks and also senior resources newsletter and [place] care project Follow Up Plan:  No further follow up planned at this time. The patient has been provided with needed resources.  Encounter Outcome:  Patient Visit Completed  Dione Booze  [facility] HealthPopulation Health Care Guide  Direct Dial:[phone] [phone] Website: [email or web address]

## 2023-04-20 ENCOUNTER — Ambulatory Visit (INDEPENDENT_AMBULATORY_CARE_PROVIDER_SITE_OTHER): Payer: Medicare Other

## 2023-04-20 ENCOUNTER — Other Ambulatory Visit: Payer: Self-pay | Admitting: *Deleted

## 2023-04-20 DIAGNOSIS — J9811 Atelectasis: Secondary | ICD-10-CM | POA: Diagnosis not present

## 2023-04-20 DIAGNOSIS — J9383 Other pneumothorax: Secondary | ICD-10-CM | POA: Diagnosis not present

## 2023-04-20 DIAGNOSIS — J939 Pneumothorax, unspecified: Secondary | ICD-10-CM | POA: Diagnosis not present

## 2023-04-21 ENCOUNTER — Telehealth: Payer: Medicare Other | Admitting: Internal Medicine

## 2023-04-21 ENCOUNTER — Encounter: Payer: Self-pay | Admitting: Internal Medicine

## 2023-04-21 DIAGNOSIS — J9383 Other pneumothorax: Secondary | ICD-10-CM

## 2023-04-21 NOTE — Progress Notes (Signed)
 I connected with Jeremy Boone on 04/21/2023 by video enabled telemedicine application and verified that I am speaking with the correct person using two identifiers. Patient is at home, Physician is in office.    I discussed the limitations of evaluation and management by telemedicine. The patient expressed understanding and agreed to proceed.        Jeremy Boone    [id number]    October 12, 1934  Primary Care Physician:McKeown, Chrissie Noa, MD Date of Appointment: 04/21/2023 Established Patient Visit  Chief complaint:   Chief Complaint  Patient presents with   pneumothorax follow up     HPI: Jeremy Boone is a 89 y.o. man with alcohol use disorder who presented to the ED with recurrent spontaneous pneumothorax. Underwent chest tube placement and talc pleurodesis.   Interval Updates: Follow up after talk pleurodesis.  He is feeling well. No pain, shortness of breath.  Has been avoiding cpap and wondering if he can go back to using it. Got his follow up chest xray as well.   I have reviewed the patient's family social and past medical history and updated as appropriate.   Past Medical History:  Diagnosis Date   Alcohol consumption of four to five drinks per day    Gout    Hyperlipidemia    Hypertension    OSA on CPAP    Prediabetes    Recurrent spontaneous pneumothorax 12/2022   Initial pneumothorax October 2024 treated with chest tubes; readmitted January 2024 current spontaneous pneumothorax    Past Surgical History:  Procedure Laterality Date   Cardiac Event Monitor  May-June 2017   Sinus rhythm with average heart rate 74.2 - no notable arrhythmias. Minimal PVCs. No PACs.   CARPAL TUNNEL RELEASE Left 2007   CATARACT EXTRACTION, BILATERAL     OTHER SURGICAL HISTORY  2007   Negative biopsy tonsil mass   ROTATOR CUFF REPAIR Left 2000    Family History  Problem Relation Age of Onset   Hypertension Mother    Stroke Father    Seizures Sister    Colon cancer Neg Hx     Stomach cancer Neg Hx    Esophageal cancer Neg Hx    Inflammatory bowel disease Neg Hx    Liver disease Neg Hx    Pancreatic cancer Neg Hx    Rectal cancer Neg Hx     Social History   Occupational History   Not on file  Tobacco Use   Smoking status: Never   Smokeless tobacco: Never  Vaping Use   Vaping status: Never Used  Substance and Sexual Activity   Alcohol use: Yes    Alcohol/week: 14.0 standard drinks of alcohol    Types: 14 Standard drinks or equivalent per week    Comment: 2 drinks/day   Drug use: No   Sexual activity: Not on file     Physical Exam: There were no vitals taken for this visit. Video visit.   Gen:      No acute distress Lungs:    breathing non labored, no wheeze Normal speech   Data Reviewed: Imaging: I have personally reviewed the chest xray obtained 2/18 shows no recurrence of pneumothorax.   PFTs:   Labs: Lab Results  Component Value Date   NA 135 03/20/2023   K 4.2 03/20/2023   CO2 27 03/20/2023   GLUCOSE 108 (H) 03/20/2023   BUN 17 03/20/2023   CREATININE 0.90 03/20/2023   CALCIUM 9.2 03/20/2023   EGFR 77 03/11/2023   GFRNONAA >60 03/20/2023  Lab Results  Component Value Date   WBC 6.0 03/20/2023   HGB 13.0 03/20/2023   HCT 38.7 (L) 03/20/2023   MCV 91.3 03/20/2023   PLT 212 03/20/2023    Immunization status: Immunization History  Administered Date(s) Administered   Influenza, High Dose Seasonal PF 12/18/2013, 10/28/2015, 12/10/2016, 01/26/2018, 11/10/2018, 12/24/2020, 01/14/2022   Influenza,inj,Quad PF,6+ Mos 03/07/2013   Influenza-Unspecified 12/01/2022   Moderna Sars-Covid-2 Vaccination 03/14/2019, 04/11/2019, 02/04/2020   PNEUMOCOCCAL CONJUGATE-20 07/01/2021   Pneumococcal Conjugate-13 03/28/2014   Pneumococcal-Unspecified 03/03/2007   Td 03/02/2004   Tdap 10/25/2014   Unspecified SARS-COV-2 Vaccination 12/01/2022   Zoster, Live 08/26/2016    External Records Personally Reviewed: hospital  stay  Assessment:  Recurrent spontaneous pneumothorax S/p talc pleurodesis right side, jan 2025  Plan/Recommendations: No restrictions on activity. Clear to resume CPAP use, plane travel.  Follow up prn.    Return to Care: Return if symptoms worsen or fail to improve.   Durel Salts, MD Pulmonary and Critical Care Medicine [facility] Office:[phone]

## 2023-05-04 ENCOUNTER — Ambulatory Visit: Payer: Self-pay

## 2023-05-04 NOTE — Patient Instructions (Signed)
 Visit Information  Thank you for taking time to visit with me today. Please don't hesitate to contact me if I can be of assistance to you.   Following are the goals we discussed today:   Goals Addressed             This Visit's Progress    COMPLETED: Keep BP and sugars controlled       Care Coordination Interventions: Provided education to patient about basic DM disease process Reviewed medications with patient and discussed importance of medication adherence Evaluation of current treatment plan related to hypertension self management and patient's adherence to plan as established by provider Provided education to patient re: stroke prevention, s/s of heart attack and stroke   Spoke with patient. He reports is doing okay.  Seen by pulmonology for follow up of pneumothorax.  Patient has been released to normal activity and can resume CPAP.  He states that his blood pressure is good.  Last reading 140/65.  He informs CM that he has had to place his wife in a facility due to increased care needs.  Discussed with him self care.  He verbalized understanding and states he is so used to doing for her.  Again CM encouraged him to be good to himself as care giving is stressful.  He is appreciative of support and agreeable to closing case.          If you are experiencing a Mental Health or Behavioral Health Crisis or need someone to talk to, please call the Suicide and Crisis Lifeline: 988   Patient verbalizes understanding of instructions and care plan provided today and agrees to view in MyChart. Active MyChart status and patient understanding of how to access instructions and care plan via MyChart confirmed with patient.     No further follow up required: No further needs  Bary Leriche RN, MSN [facility], [facility] RN Care Manager Direct Dial: [phone]  Fax: [phone] Website: [email or web address]

## 2023-05-04 NOTE — Patient Outreach (Addendum)
 Care Coordination   Follow Up Visit Note   05/04/2023 Name: Jeremy Boone MRN: [id number] DOB: 01-13-1935  Jeremy Boone is a 89 y.o. year old male who sees Jeremy Cowboy, MD for primary care. I spoke with  Jeremy Boone by phone today.  What matters to the patients health and wellness today?  Taking care of self  and seeing new PCP after current PCP passed away.   Goals Addressed             This Visit's Progress    COMPLETED: Keep BP and sugars controlled       Care Coordination Interventions: Provided education to patient about basic DM disease process Reviewed medications with patient and discussed importance of medication adherence Evaluation of current treatment plan related to hypertension self management and patient's adherence to plan as established by provider Provided education to patient re: stroke prevention, s/s of heart attack and stroke   Spoke with patient. He reports is doing okay.  Seen by pulmonology for follow up of pneumothorax.  Patient has been released to normal activity and can resume CPAP.  He states that his blood pressure is good.  Last reading 140/65.  He informs CM that he has had to place his wife in a facility due to increased care needs.  Discussed with him self care.  He verbalized understanding and states he is so used to doing for her.  Again CM encouraged him to be good to himself as care giving is stressful.  He is appreciative of support and agreeable to closing case.          SDOH assessments and interventions completed:  Yes     Care Coordination Interventions:  Yes, provided   Follow up plan: No further intervention required.   Encounter Outcome:  Patient Visit Completed   Bary Leriche RN, MSN [facility], [facility] RN Care Manager Direct Dial: [phone]  Fax: [phone] Website: [email or web address]

## 2023-05-26 ENCOUNTER — Encounter: Payer: Medicare Other | Admitting: Internal Medicine

## 2023-06-02 NOTE — Patient Instructions (Incomplete)
 Welcome to [facility]!  Thank you for choosing [place] for your Primary Care needs.   We offer in person and video appointments for your convenience. You may call our office to schedule appointments, or you may schedule appointments with me through MyChart.   The best way to get in contact with me is via MyChart message. This will get to me faster than a phone call, unless there is an emergency, then please call 911.  The lab is located downstairs in the Sports Medicine building, we also have xray available there.   I have sent in flonase for you to use 2 sprays per nostril once daily.   Follow up with me in about 3 months for medication management,

## 2023-06-02 NOTE — Progress Notes (Unsigned)
 New Patient Office Visit  Subjective    Patient ID: Jeremy Boone, male    DOB: Dec 26, 1934  Age: 89 y.o. MRN: [id number]  CC: No chief complaint on file.   HPI Jeremy Boone presents to establish care today. Was hospitalized in January for spontaneous pneumothorax that required admission and chest tube. Up to date on routine vaccines. Up to date on routine screenings.  Receives regular dental and eye care.  Reports eating well, sleeping well, feeling well overall.  Reports compliance with medication regimen.  Denies other concerns today.  Outpatient Encounter Medications as of 06/03/2023  Medication Sig  . allopurinol (ZYLOPRIM) 300 MG tablet Take  1 tablet  Daily for For Gout Prevention                                                                /                                                                   TAKE                                         BY                                                 MOUTH (Patient not taking: Reported on 03/23/2023)  . amLODipine (NORVASC) 2.5 MG tablet Take 1 tablet (2.5 mg total) by mouth daily.  Marland Kitchen aspirin EC 81 MG tablet Take 81 mg by mouth daily. Patient was told to start taking per Dr, but Patient hasn't started 03/16/2023. (Patient not taking: Reported on 04/06/2023)  . Cholecalciferol (VITAMIN D PO) Take 5,000 Int'l Units by mouth 2 (two) times daily.  Marland Kitchen ezetimibe (ZETIA) 10 MG tablet TAKE 1 TABLET BY MOUTH DAILY FOR CHOLESTEROL (Patient taking differently: Take 10 mg by mouth daily. TAKE 1 TABLET BY MOUTH  DAILY FOR CHOLESTEROL)  . famotidine (PEPCID) 20 MG tablet TAKE 1 TABLET BY MOUTH TWICE  DAILY BEFORE BREAKFAST AND  SUPPER TO PREVENT HEARTBURN AND  INDIGESTION  . furosemide (LASIX) 40 MG tablet Take 1 tablet Daily as needed for  Fluid Retention / Ankle Swelling  . gabapentin (NEURONTIN) 300 MG capsule TAKE 1 TO 2 CAPSULES BY MOUTH 1  TO 2 HOURS BEFORE BEDTIME AS  NEEDED FOR SLEEP  . gemfibrozil (LOPID) 600 MG tablet TAKE 1 TABLET  BY MOUTH TWICE  DAILY WITH MEALS FOR CHOLESTEROL  . oxybutynin (DITROPAN-XL) 10 MG 24 hr tablet Take  1 tablet Daily for Bladder                                                                 /  TAKE                       BY                                                 MOUTH (Patient taking differently: Take 10 mg by mouth daily as needed (retention). Take  1 tablet Daily for Bladder                                                                 /                                                     TAKE                       BY                                                 MOUTH)  . thiamine (VITAMIN B-1) 100 MG tablet Take 1 tablet (100 mg total) by mouth daily.  Marland Kitchen triamcinolone (NASACORT) 55 MCG/ACT AERO nasal inhaler Place 2 sprays into the nose at bedtime. (Patient taking differently: Place 2 sprays into the nose daily as needed (allergies).)   No facility-administered encounter medications on file as of 06/03/2023.    Past Medical History:  Diagnosis Date  . Alcohol consumption of four to five drinks per day   . Gout   . Hyperlipidemia   . Hypertension   . OSA on CPAP   . Prediabetes   . Recurrent spontaneous pneumothorax 12/2022   Initial pneumothorax October 2024 treated with chest tubes; readmitted January 2024 current spontaneous pneumothorax    Past Surgical History:  Procedure Laterality Date  . Cardiac Event Monitor  May-June 2017   Sinus rhythm with average heart rate 74.2 - no notable arrhythmias. Minimal PVCs. No PACs.  . CARPAL TUNNEL RELEASE Left 2007  . CATARACT EXTRACTION, BILATERAL    . OTHER SURGICAL HISTORY  2007   Negative biopsy tonsil mass  . ROTATOR CUFF REPAIR Left 2000    Family History  Problem Relation Age of Onset  . Hypertension Mother   . Stroke Father   . Seizures Sister   . Colon cancer Neg Hx   . Stomach cancer Neg Hx   . Esophageal cancer Neg Hx   . Inflammatory bowel disease Neg Hx    . Liver disease Neg Hx   . Pancreatic cancer Neg Hx   . Rectal cancer Neg Hx     Social History   Socioeconomic History  . Marital status: Married    Spouse name: Not on file  . Number of children: Not on file  . Years of education: Not on file  . Highest education level: Some college, no degree  Occupational  History  . Not on file  Tobacco Use  . Smoking status: Never  . Smokeless tobacco: Never  Vaping Use  . Vaping status: Never Used  Substance and Sexual Activity  . Alcohol use: Yes    Alcohol/week: 14.0 standard drinks of alcohol    Types: 14 Standard drinks or equivalent per week    Comment: 2 drinks/day  . Drug use: No  . Sexual activity: Not on file  Other Topics Concern  . Not on file  Social History Narrative  . Not on file   Social Drivers of Health   Financial Resource Strain: Medium Risk (05/27/2023)   Overall Financial Resource Strain (CARDIA)   . Difficulty of Paying Living Expenses: Somewhat hard  Food Insecurity: Food Insecurity Present (05/27/2023)   Hunger Vital Sign   . Worried About Programme researcher, broadcasting/film/video in the Last Year: Sometimes true   . Ran Out of Food in the Last Year: Sometimes true  Transportation Needs: No Transportation Needs (05/27/2023)   PRAPARE - Transportation   . Lack of Transportation (Medical): No   . Lack of Transportation (Non-Medical): No  Physical Activity: Unknown (05/27/2023)   Exercise Vital Sign   . Days of Exercise per Week: 0 days   . Minutes of Exercise per Session: Not on file  Stress: Stress Concern Present (05/27/2023)   [employer] of Occupational Health - Occupational Stress Questionnaire   . Feeling of Stress : To some extent  Social Connections: Unknown (05/27/2023)   Social Connection and Isolation Panel [NHANES]   . Frequency of Communication with Friends and Family: More than three times a week   . Frequency of Social Gatherings with Friends and Family: Three times a week   . Attends Religious Services:  Patient declined   . Active Member of Clubs or Organizations: No   . Attends Banker Meetings: 1 to 4 times per year   . Marital Status: Patient declined  Intimate Partner Violence: Not At Risk (03/18/2023)   Humiliation, Afraid, Rape, and Kick questionnaire   . Fear of Current or Ex-Partner: No   . Emotionally Abused: No   . Physically Abused: No   . Sexually Abused: No    ROS Per HPI      Objective    There were no vitals taken for this visit.  Physical Exam Vitals and nursing note reviewed.  Constitutional:      General: He is not in acute distress.    Appearance: Normal appearance.  HENT:     Head: Normocephalic and atraumatic.     Right Ear: External ear normal.     Left Ear: External ear normal.     Nose: Nose normal.     Mouth/Throat:     Mouth: Mucous membranes are moist.     Pharynx: Oropharynx is clear.  Eyes:     Extraocular Movements: Extraocular movements intact.  Cardiovascular:     Rate and Rhythm: Normal rate and regular rhythm.     Pulses: Normal pulses.     Heart sounds: Normal heart sounds.  Pulmonary:     Effort: Pulmonary effort is normal. No respiratory distress.     Breath sounds: Normal breath sounds. No wheezing, rhonchi or rales.  Musculoskeletal:        General: Normal range of motion.     Cervical back: Normal range of motion.     Right lower leg: No edema.     Left lower leg: No edema.  Lymphadenopathy:  Cervical: No cervical adenopathy.  Skin:    General: Skin is warm and dry.  Neurological:     General: No focal deficit present.     Mental Status: He is alert and oriented to person, place, and time.  Psychiatric:        Mood and Affect: Mood normal.        Behavior: Behavior normal.       Assessment & Plan:   Essential hypertension  Statin myopathy  Vitamin D deficiency  Hyperlipidemia, mixed     No follow-ups on file.   Sherald Barge, FNP

## 2023-06-03 ENCOUNTER — Ambulatory Visit (INDEPENDENT_AMBULATORY_CARE_PROVIDER_SITE_OTHER): Payer: Medicare Other | Admitting: Family Medicine

## 2023-06-03 ENCOUNTER — Encounter: Payer: Self-pay | Admitting: Family Medicine

## 2023-06-03 VITALS — BP 140/80 | HR 78 | Temp 98.2°F | Ht 71.0 in | Wt 175.4 lb

## 2023-06-03 DIAGNOSIS — Z6824 Body mass index (BMI) 24.0-24.9, adult: Secondary | ICD-10-CM | POA: Diagnosis not present

## 2023-06-03 DIAGNOSIS — E782 Mixed hyperlipidemia: Secondary | ICD-10-CM

## 2023-06-03 DIAGNOSIS — J3089 Other allergic rhinitis: Secondary | ICD-10-CM

## 2023-06-03 DIAGNOSIS — I10 Essential (primary) hypertension: Secondary | ICD-10-CM | POA: Diagnosis not present

## 2023-06-03 DIAGNOSIS — E559 Vitamin D deficiency, unspecified: Secondary | ICD-10-CM

## 2023-06-03 DIAGNOSIS — R300 Dysuria: Secondary | ICD-10-CM | POA: Diagnosis not present

## 2023-06-03 DIAGNOSIS — G720 Drug-induced myopathy: Secondary | ICD-10-CM

## 2023-06-03 DIAGNOSIS — J309 Allergic rhinitis, unspecified: Secondary | ICD-10-CM | POA: Insufficient documentation

## 2023-06-03 LAB — POCT URINALYSIS DIP (CLINITEK)
Bilirubin, UA: NEGATIVE
Glucose, UA: NEGATIVE mg/dL
Ketones, POC UA: NEGATIVE mg/dL
Leukocytes, UA: NEGATIVE
Nitrite, UA: NEGATIVE
POC PROTEIN,UA: NEGATIVE
Spec Grav, UA: 1.01 (ref 1.010–1.025)
Urobilinogen, UA: 0.2 U/dL
pH, UA: 6.5 (ref 5.0–8.0)

## 2023-06-03 MED ORDER — FLUTICASONE PROPIONATE 50 MCG/ACT NA SUSP: 2.0000 | Freq: Every day | NASAL | 2 refills | Status: DC

## 2023-06-03 NOTE — Assessment & Plan Note (Signed)
 Continue efforts and healthy diet and activity level

## 2023-06-03 NOTE — Assessment & Plan Note (Signed)
 Will trial Flonase, avoid allergens

## 2023-06-03 NOTE — Assessment & Plan Note (Signed)
 Did not take meds today, discussed importance of blood pressure control

## 2023-06-24 ENCOUNTER — Encounter: Payer: Medicare Other | Admitting: Internal Medicine

## 2023-06-24 ENCOUNTER — Other Ambulatory Visit: Payer: Self-pay | Admitting: Family

## 2023-06-24 DIAGNOSIS — E782 Mixed hyperlipidemia: Secondary | ICD-10-CM

## 2023-06-28 ENCOUNTER — Encounter: Payer: Medicare Other | Admitting: Nurse Practitioner

## 2023-06-29 ENCOUNTER — Encounter: Payer: Self-pay | Admitting: Pulmonary Disease

## 2023-06-29 ENCOUNTER — Ambulatory Visit: Payer: Medicare Other | Admitting: Pulmonary Disease

## 2023-06-29 VITALS — BP 136/84 | Ht 71.0 in | Wt 176.0 lb

## 2023-06-29 DIAGNOSIS — R059 Cough, unspecified: Secondary | ICD-10-CM

## 2023-06-29 DIAGNOSIS — J9383 Other pneumothorax: Secondary | ICD-10-CM

## 2023-06-29 MED ORDER — ALBUTEROL SULFATE HFA 108 (90 BASE) MCG/ACT IN AERS: 2.0000 | INHALATION_SPRAY | Freq: Four times a day (QID) | RESPIRATORY_TRACT | 6 refills | Status: AC | PRN

## 2023-06-29 NOTE — Progress Notes (Unsigned)
 Synopsis: Referred in *** for ***  Subjective:   PATIENT ID: Jeremy Boone GENDER: male DOB: 09/24/1934, MRN: [id number]   HPI  No chief complaint on file.   ***  Past Medical History:  Diagnosis Date   Alcohol consumption of four to five drinks per day    Gout    Hyperlipidemia    Hypertension    OSA on CPAP    Prediabetes    Recurrent spontaneous pneumothorax 12/2022   Initial pneumothorax October 2024 treated with chest tubes; readmitted January 2024 current spontaneous pneumothorax     Family History  Problem Relation Age of Onset   Hypertension Mother    Stroke Father    Seizures Sister    Colon cancer Neg Hx    Stomach cancer Neg Hx    Esophageal cancer Neg Hx    Inflammatory bowel disease Neg Hx    Liver disease Neg Hx    Pancreatic cancer Neg Hx    Rectal cancer Neg Hx      Social History   Socioeconomic History   Marital status: Married    Spouse name: Not on file   Number of children: Not on file   Years of education: Not on file   Highest education level: Some college, no degree  Occupational History   Not on file  Tobacco Use   Smoking status: Never   Smokeless tobacco: Never  Vaping Use   Vaping status: Never Used  Substance and Sexual Activity   Alcohol use: Yes    Alcohol/week: 14.0 standard drinks of alcohol    Types: 14 Standard drinks or equivalent per week    Comment: 2 drinks/day   Drug use: No   Sexual activity: Not on file  Other Topics Concern   Not on file  Social History Narrative   Not on file   Social Drivers of Health   Financial Resource Strain: Medium Risk (05/27/2023)   Overall Financial Resource Strain (CARDIA)    Difficulty of Paying Living Expenses: Somewhat hard  Food Insecurity: Food Insecurity Present (05/27/2023)   Hunger Vital Sign    Worried About Running Out of Food in the Last Year: Sometimes true    Ran Out of Food in the Last Year: Sometimes true  Transportation Needs: No Transportation Needs  (05/27/2023)   PRAPARE - Administrator, Civil Service (Medical): No    Lack of Transportation (Non-Medical): No  Physical Activity: Unknown (05/27/2023)   Exercise Vital Sign    Days of Exercise per Week: 0 days    Minutes of Exercise per Session: Not on file  Stress: Stress Concern Present (05/27/2023)   [employer] of Occupational Health - Occupational Stress Questionnaire    Feeling of Stress : To some extent  Social Connections: Unknown (05/27/2023)   Social Connection and Isolation Panel [NHANES]    Frequency of Communication with Friends and Family: More than three times a week    Frequency of Social Gatherings with Friends and Family: Three times a week    Attends Religious Services: Patient declined    Active Member of Clubs or Organizations: No    Attends Banker Meetings: 1 to 4 times per year    Marital Status: Patient declined  Intimate Partner Violence: Not At Risk (03/18/2023)   Humiliation, Afraid, Rape, and Kick questionnaire    Fear of Current or Ex-Partner: No    Emotionally Abused: No    Physically Abused: No    Sexually Abused:  No     Allergies  Allergen Reactions   Ace Inhibitors Cough   Lipitor [Atorvastatin]     Elevates CPK and Aldolase     Outpatient Medications Prior to Visit  Medication Sig Dispense Refill   allopurinol  (ZYLOPRIM ) 300 MG tablet Take  1 tablet  Daily for For Gout Prevention                                                                /                                                                   TAKE                                         BY                                                 MOUTH 90 tablet 3   amLODipine  (NORVASC ) 2.5 MG tablet Take 1 tablet (2.5 mg total) by mouth daily. (Patient not taking: Reported on 06/03/2023) 30 tablet 1   aspirin  EC 81 MG tablet Take 81 mg by mouth daily. Patient was told to start taking per Dr, but Patient hasn't started 03/16/2023. (Patient not taking: Reported on  04/06/2023)     Cholecalciferol (VITAMIN D  PO) Take 5,000 Int'l Units by mouth 2 (two) times daily.     ezetimibe  (ZETIA ) 10 MG tablet TAKE 1 TABLET BY MOUTH DAILY FOR CHOLESTEROL (Patient taking differently: Take 10 mg by mouth daily. TAKE 1 TABLET BY MOUTH  DAILY FOR CHOLESTEROL) 100 tablet 2   famotidine  (PEPCID ) 20 MG tablet TAKE 1 TABLET BY MOUTH TWICE  DAILY BEFORE BREAKFAST AND  SUPPER TO PREVENT HEARTBURN AND  INDIGESTION 200 tablet 2   fluticasone  (FLONASE ) 50 MCG/ACT nasal spray Place 2 sprays into both nostrils daily. 9.9 mL 2   furosemide  (LASIX ) 40 MG tablet Take 1 tablet Daily as needed for  Fluid Retention / Ankle Swelling 90 tablet 3   gabapentin  (NEURONTIN ) 300 MG capsule TAKE 1 TO 2 CAPSULES BY MOUTH 1  TO 2 HOURS BEFORE BEDTIME AS  NEEDED FOR SLEEP 200 capsule 2   gemfibrozil  (LOPID ) 600 MG tablet TAKE 1 TABLET BY MOUTH TWICE  DAILY WITH MEALS FOR CHOLESTEROL 200 tablet 0   oxybutynin  (DITROPAN -XL) 10 MG 24 hr tablet Take  1 tablet Daily for Bladder                                                                 /  TAKE                       BY                                                 MOUTH (Patient taking differently: Take 10 mg by mouth daily as needed (retention). Take  1 tablet Daily for Bladder                                                                 /                                                     TAKE                       BY                                                 MOUTH) 90 tablet 3   thiamine  (VITAMIN B-1) 100 MG tablet Take 1 tablet (100 mg total) by mouth daily. 90 tablet 1   No facility-administered medications prior to visit.    ROS    Objective:  There were no vitals filed for this visit.   Physical Exam    CBC    Component Value Date/Time   WBC 6.0 03/20/2023 0220   RBC 4.24 03/20/2023 0220   HGB 13.0 03/20/2023 0220   HCT 38.7 (L) 03/20/2023 0220   PLT 212 03/20/2023 0220    MCV 91.3 03/20/2023 0220   MCH 30.7 03/20/2023 0220   MCHC 33.6 03/20/2023 0220   RDW 13.5 03/20/2023 0220   LYMPHSABS 1.7 03/19/2023 0237   MONOABS 0.7 03/19/2023 0237   EOSABS 0.4 03/19/2023 0237   BASOSABS 0.0 03/19/2023 0237     Chest imaging:  PFT:     No data to display          Labs:  Path:  Echo:  Heart Catheterization:       Assessment & Plan:   No diagnosis found.  Discussion: ***    Current Outpatient Medications:    allopurinol  (ZYLOPRIM ) 300 MG tablet, Take  1 tablet  Daily for For Gout Prevention                                                                /  TAKE                                         BY                                                 MOUTH, Disp: 90 tablet, Rfl: 3   amLODipine  (NORVASC ) 2.5 MG tablet, Take 1 tablet (2.5 mg total) by mouth daily. (Patient not taking: Reported on 06/03/2023), Disp: 30 tablet, Rfl: 1   aspirin  EC 81 MG tablet, Take 81 mg by mouth daily. Patient was told to start taking per Dr, but Patient hasn't started 03/16/2023. (Patient not taking: Reported on 04/06/2023), Disp: , Rfl:    Cholecalciferol (VITAMIN D  PO), Take 5,000 Int'l Units by mouth 2 (two) times daily., Disp: , Rfl:    ezetimibe  (ZETIA ) 10 MG tablet, TAKE 1 TABLET BY MOUTH DAILY FOR CHOLESTEROL (Patient taking differently: Take 10 mg by mouth daily. TAKE 1 TABLET BY MOUTH  DAILY FOR CHOLESTEROL), Disp: 100 tablet, Rfl: 2   famotidine  (PEPCID ) 20 MG tablet, TAKE 1 TABLET BY MOUTH TWICE  DAILY BEFORE BREAKFAST AND  SUPPER TO PREVENT HEARTBURN AND  INDIGESTION, Disp: 200 tablet, Rfl: 2   fluticasone  (FLONASE ) 50 MCG/ACT nasal spray, Place 2 sprays into both nostrils daily., Disp: 9.9 mL, Rfl: 2   furosemide  (LASIX ) 40 MG tablet, Take 1 tablet Daily as needed for  Fluid Retention / Ankle Swelling, Disp: 90 tablet, Rfl: 3   gabapentin  (NEURONTIN ) 300 MG capsule, TAKE 1 TO 2 CAPSULES BY  MOUTH 1  TO 2 HOURS BEFORE BEDTIME AS  NEEDED FOR SLEEP, Disp: 200 capsule, Rfl: 2   gemfibrozil  (LOPID ) 600 MG tablet, TAKE 1 TABLET BY MOUTH TWICE  DAILY WITH MEALS FOR CHOLESTEROL, Disp: 200 tablet, Rfl: 0   oxybutynin  (DITROPAN -XL) 10 MG 24 hr tablet, Take  1 tablet Daily for Bladder                                                                 /                                                     TAKE                       BY                                                 MOUTH (Patient taking differently: Take 10 mg by mouth daily as needed (retention). Take  1 tablet Daily for Bladder                                                                 /  TAKE                       BY                                                 MOUTH), Disp: 90 tablet, Rfl: 3   thiamine  (VITAMIN B-1) 100 MG tablet, Take 1 tablet (100 mg total) by mouth daily., Disp: 90 tablet, Rfl: 1

## 2023-06-29 NOTE — Patient Instructions (Addendum)
 Your pneumothorax has resolved based on the last chest x-ray  Use albuterol  inhaler 1-2 puffs every 4-6 hours as needed  Follow up in 1 year, call sooner if needed your breathing symptoms change

## 2023-06-30 ENCOUNTER — Encounter: Payer: Self-pay | Admitting: Pulmonary Disease

## 2023-07-22 ENCOUNTER — Encounter: Payer: Self-pay | Admitting: General Practice

## 2023-08-29 ENCOUNTER — Other Ambulatory Visit: Payer: Self-pay | Admitting: Family Medicine

## 2023-08-29 DIAGNOSIS — J3089 Other allergic rhinitis: Secondary | ICD-10-CM

## 2023-09-06 ENCOUNTER — Telehealth: Payer: Self-pay

## 2023-09-06 ENCOUNTER — Encounter: Payer: Self-pay | Admitting: Family Medicine

## 2023-09-06 ENCOUNTER — Ambulatory Visit (INDEPENDENT_AMBULATORY_CARE_PROVIDER_SITE_OTHER): Admitting: Family Medicine

## 2023-09-06 VITALS — BP 140/82 | HR 71 | Temp 97.5°F | Ht 71.0 in | Wt 178.8 lb

## 2023-09-06 DIAGNOSIS — I10 Essential (primary) hypertension: Secondary | ICD-10-CM

## 2023-09-06 DIAGNOSIS — E559 Vitamin D deficiency, unspecified: Secondary | ICD-10-CM

## 2023-09-06 DIAGNOSIS — M25551 Pain in right hip: Secondary | ICD-10-CM | POA: Insufficient documentation

## 2023-09-06 DIAGNOSIS — Z79899 Other long term (current) drug therapy: Secondary | ICD-10-CM

## 2023-09-06 DIAGNOSIS — E782 Mixed hyperlipidemia: Secondary | ICD-10-CM

## 2023-09-06 DIAGNOSIS — R0981 Nasal congestion: Secondary | ICD-10-CM | POA: Insufficient documentation

## 2023-09-06 LAB — CBC WITH DIFFERENTIAL/PLATELET
Basophils Absolute: 0 K/uL (ref 0.0–0.1)
Basophils Relative: 0.8 % (ref 0.0–3.0)
Eosinophils Absolute: 0.3 K/uL (ref 0.0–0.7)
Eosinophils Relative: 6.9 % — ABNORMAL HIGH (ref 0.0–5.0)
HCT: 44.1 % (ref 39.0–52.0)
Hemoglobin: 14.4 g/dL (ref 13.0–17.0)
Lymphocytes Relative: 38 % (ref 12.0–46.0)
Lymphs Abs: 1.5 K/uL (ref 0.7–4.0)
MCHC: 32.7 g/dL (ref 30.0–36.0)
MCV: 91.5 fl (ref 78.0–100.0)
Monocytes Absolute: 0.5 K/uL (ref 0.1–1.0)
Monocytes Relative: 11.9 % (ref 3.0–12.0)
Neutro Abs: 1.7 K/uL (ref 1.4–7.7)
Neutrophils Relative %: 42.4 % — ABNORMAL LOW (ref 43.0–77.0)
Platelets: 230 K/uL (ref 150.0–400.0)
RBC: 4.82 Mil/uL (ref 4.22–5.81)
RDW: 14.5 % (ref 11.5–15.5)
WBC: 4 K/uL (ref 4.0–10.5)

## 2023-09-06 LAB — COMPREHENSIVE METABOLIC PANEL WITH GFR
ALT: 6 U/L (ref 0–53)
AST: 18 U/L (ref 0–37)
Albumin: 4.5 g/dL (ref 3.5–5.2)
Alkaline Phosphatase: 60 U/L (ref 39–117)
BUN: 19 mg/dL (ref 6–23)
CO2: 28 meq/L (ref 19–32)
Calcium: 9.5 mg/dL (ref 8.4–10.5)
Chloride: 103 meq/L (ref 96–112)
Creatinine, Ser: 0.97 mg/dL (ref 0.40–1.50)
GFR: 70.21 mL/min (ref >60.00)
Glucose, Bld: 76 mg/dL (ref 70–99)
Potassium: 4.1 meq/L (ref 3.5–5.1)
Sodium: 139 meq/L (ref 135–145)
Total Bilirubin: 0.4 mg/dL (ref 0.2–1.2)
Total Protein: 8.2 g/dL (ref 6.0–8.3)

## 2023-09-06 LAB — LIPID PANEL
Cholesterol: 188 mg/dL (ref 0–200)
HDL: 86.5 mg/dL (ref >39.00)
LDL Cholesterol: 90 mg/dL (ref 0–99)
NonHDL: 101.95
Total CHOL/HDL Ratio: 2
Triglycerides: 62 mg/dL (ref 0.0–149.0)
VLDL: 12.4 mg/dL (ref 0.0–40.0)

## 2023-09-06 LAB — VITAMIN B12: Vitamin B-12: 762 pg/mL (ref 211–911)

## 2023-09-06 LAB — HEMOGLOBIN A1C: Hgb A1c MFr Bld: 6.2 % (ref 4.6–6.5)

## 2023-09-06 LAB — VITAMIN D 25 HYDROXY (VIT D DEFICIENCY, FRACTURES): VITD: >120 ng/mL

## 2023-09-06 NOTE — Progress Notes (Signed)
 Acute Office Visit  Subjective:     Patient ID: Jeremy Boone, male    DOB: Aug 16, 1934, 89 y.o.   MRN: [id number]  No chief complaint on file.   HPI  Discussed the use of AI scribe software for clinical note transcription with the patient, who gave verbal consent to proceed.  History of Present Illness Mr. Jeremy Boone is an 89 year old male who presents with right hip pain and congestion.  Right hip pain - Localized to the right lateral hip - Occurs primarily when lying down - Pain sometimes awakens him at night - No radiation down the leg - Tylenol  provides symptomatic relief - Sleeps mostly on his side - Does not use a pillow between his knees  Upper respiratory congestion and postnasal drip - Congestion characterized by sensation of postnasal drip - Persistent feeling of something stuck in the throat that cannot be swallowed - Mucus relief medication alleviates symptoms - No use of antihistamines such as Claritin for this issue   ROS Per HPI      Objective:    BP (!) 140/82 (BP Location: Left Arm, Patient Position: Sitting)   Pulse 71   Temp (!) 97.5 F (36.4 C) (Temporal)   Ht 5' 11 (1.803 m)   Wt 178 lb 12.8 oz (81.1 kg)   SpO2 95%   BMI 24.94 kg/m    Physical Exam Vitals and nursing note reviewed.  Constitutional:      General: He is not in acute distress.    Appearance: Normal appearance.  HENT:     Head: Normocephalic and atraumatic.     Right Ear: External ear normal. A middle ear effusion is present. Tympanic membrane is not erythematous or bulging.     Left Ear: External ear normal. A middle ear effusion is present. Tympanic membrane is not erythematous or bulging.     Nose: Rhinorrhea present.     Mouth/Throat:     Mouth: Mucous membranes are moist.     Pharynx: Oropharynx is clear.     Comments: Oropharyngeal cobblestoning   Eyes:     Extraocular Movements: Extraocular movements intact.  Cardiovascular:     Rate and Rhythm:  Normal rate and regular rhythm.     Pulses: Normal pulses.     Heart sounds: Normal heart sounds.  Pulmonary:     Effort: Pulmonary effort is normal. No respiratory distress.     Breath sounds: Normal breath sounds. No wheezing, rhonchi or rales.  Musculoskeletal:     Cervical back: Normal range of motion.     Right lower leg: No edema.     Left lower leg: No edema.     Comments: Using single-point cane with ambulation  Lymphadenopathy:     Cervical: No cervical adenopathy.  Skin:    General: Skin is warm and dry.  Neurological:     General: No focal deficit present.     Mental Status: He is alert and oriented to person, place, and time.  Psychiatric:        Mood and Affect: Mood normal.        Behavior: Behavior normal.     No results found for any visits on 09/06/23.      Assessment & Plan:   Assessment and Plan Assessment & Plan Right Hip Pain Intermittent lateral hip pain, relieved by Tylenol , likely musculoskeletal. - Use pillow between knees while sleeping.  Postnasal Drip Congestion with throat mucus sensation, relieved by Mucinex . No wheezing or chest  issues. - Continue nasal spray as needed. - Consider Claritin if Mucinex  insufficient.  Follow-up Reassess symptoms and treatment efficacy. Blood tests to evaluate underlying issues. - Schedule follow-up in three months. - Perform lab tests today.  HTN - Continue amlodipine  -CBC, CMP  HLD - continue zetia  and gemfibrozil  - lipids today  Neuropathy - vit B12 today  Vit D Deficiency - vit D today    Return in about 3 months (around 12/07/2023).  Corean LITTIE Ku, FNP

## 2023-09-06 NOTE — Telephone Encounter (Signed)
 Spoke with patient, advised to D/C vitamin D  for 3 weeks

## 2023-09-09 ENCOUNTER — Ambulatory Visit: Payer: Self-pay | Admitting: Family Medicine

## 2023-09-27 ENCOUNTER — Ambulatory Visit: Payer: Medicare Other | Admitting: Nurse Practitioner

## 2023-10-22 ENCOUNTER — Telehealth: Payer: Self-pay

## 2023-10-22 ENCOUNTER — Telehealth: Payer: Self-pay | Admitting: Family Medicine

## 2023-10-22 DIAGNOSIS — K219 Gastro-esophageal reflux disease without esophagitis: Secondary | ICD-10-CM

## 2023-10-22 DIAGNOSIS — E782 Mixed hyperlipidemia: Secondary | ICD-10-CM

## 2023-10-22 MED ORDER — EZETIMIBE 10 MG PO TABS: ORAL_TABLET | ORAL | 2 refills | Status: DC

## 2023-10-22 MED ORDER — FAMOTIDINE 20 MG PO TABS: ORAL_TABLET | ORAL | 2 refills | Status: DC

## 2023-10-22 MED ORDER — GEMFIBROZIL 600 MG PO TABS: ORAL_TABLET | ORAL | 1 refills | Status: DC

## 2023-10-22 NOTE — Telephone Encounter (Signed)
 Copied from CRM #[id number]. Topic: Clinical - Medication Refill >> Oct 22, 2023 10:24 AM Harlene ORN wrote: Medication: gemfibrozil  (LOPID ) 600 MG tablet, famotidine  (PEPCID ) 20 MG tablet, ezetimibe  (ZETIA ) 10 MG tablet  Has the patient contacted their pharmacy? Yes (Agent: If no, request that the patient contact the pharmacy for the refill. If patient does not wish to contact the pharmacy document the reason why and proceed with request.) (Agent: If yes, when and what did the pharmacy advise?)  This is the patient's preferred pharmacy:  [facility]  Address: [address] [place], [place] [address] Phone: [phone]   Is this the correct pharmacy for this prescription? Yes If no, delete pharmacy and type the correct one.   Has the prescription been filled recently? No  Is the patient out of the medication? Yes  Has the patient been seen for an appointment in the last year OR does the patient have an upcoming appointment? Yes  Can we respond through MyChart? No  Agent: Please be advised that Rx refills may take up to 3 business days. We ask that you follow-up with your pharmacy.

## 2023-10-22 NOTE — Telephone Encounter (Signed)
 Refills sent in

## 2023-10-22 NOTE — Telephone Encounter (Signed)
 Copied from CRM #[id number]. Topic: Clinical - Medication Question >> Oct 22, 2023 10:24 AM Harlene ORN wrote: Reason for CRM: PCP needs to contact [facility] in [place], [place] [address]. The refills requests  are still being sent to his late Provider that passed earlier this year. Please call to rectify. [facility] Phone : [phone]

## 2023-12-07 ENCOUNTER — Encounter: Payer: Self-pay | Admitting: Family Medicine

## 2023-12-07 ENCOUNTER — Ambulatory Visit: Admitting: Family Medicine

## 2023-12-07 VITALS — BP 142/82 | HR 66 | Temp 98.1°F | Ht 71.0 in | Wt 175.8 lb

## 2023-12-07 DIAGNOSIS — Z79899 Other long term (current) drug therapy: Secondary | ICD-10-CM | POA: Diagnosis not present

## 2023-12-07 DIAGNOSIS — M25551 Pain in right hip: Secondary | ICD-10-CM

## 2023-12-07 DIAGNOSIS — G8929 Other chronic pain: Secondary | ICD-10-CM

## 2023-12-07 DIAGNOSIS — Z23 Encounter for immunization: Secondary | ICD-10-CM | POA: Diagnosis not present

## 2023-12-07 DIAGNOSIS — I10 Essential (primary) hypertension: Secondary | ICD-10-CM

## 2023-12-07 DIAGNOSIS — M25511 Pain in right shoulder: Secondary | ICD-10-CM

## 2023-12-07 LAB — COMPREHENSIVE METABOLIC PANEL WITH GFR
ALT: 6 U/L (ref 0–53)
AST: 18 U/L (ref 0–37)
Albumin: 4.5 g/dL (ref 3.5–5.2)
Alkaline Phosphatase: 66 U/L (ref 39–117)
BUN: 12 mg/dL (ref 6–23)
CO2: 29 meq/L (ref 19–32)
Calcium: 9.9 mg/dL (ref 8.4–10.5)
Chloride: 102 meq/L (ref 96–112)
Creatinine, Ser: 0.89 mg/dL (ref 0.40–1.50)
GFR: 76.94 mL/min (ref >60.00)
Glucose, Bld: 115 mg/dL — ABNORMAL HIGH (ref 70–99)
Potassium: 4 meq/L (ref 3.5–5.1)
Sodium: 138 meq/L (ref 135–145)
Total Bilirubin: 0.4 mg/dL (ref 0.2–1.2)
Total Protein: 8.2 g/dL (ref 6.0–8.3)

## 2023-12-07 LAB — CBC WITH DIFFERENTIAL/PLATELET
Basophils Absolute: 0 K/uL (ref 0.0–0.1)
Basophils Relative: 0.8 % (ref 0.0–3.0)
Eosinophils Absolute: 0.3 K/uL (ref 0.0–0.7)
Eosinophils Relative: 6.8 % — ABNORMAL HIGH (ref 0.0–5.0)
HCT: 43 % (ref 39.0–52.0)
Hemoglobin: 14.2 g/dL (ref 13.0–17.0)
Lymphocytes Relative: 38.7 % (ref 12.0–46.0)
Lymphs Abs: 1.6 K/uL (ref 0.7–4.0)
MCHC: 32.9 g/dL (ref 30.0–36.0)
MCV: 92.4 fl (ref 78.0–100.0)
Monocytes Absolute: 0.4 K/uL (ref 0.1–1.0)
Monocytes Relative: 9.6 % (ref 3.0–12.0)
Neutro Abs: 1.8 K/uL (ref 1.4–7.7)
Neutrophils Relative %: 44.1 % (ref 43.0–77.0)
Platelets: 238 K/uL (ref 150.0–400.0)
RBC: 4.66 Mil/uL (ref 4.22–5.81)
RDW: 14.5 % (ref 11.5–15.5)
WBC: 4.2 K/uL (ref 4.0–10.5)

## 2023-12-07 LAB — URINALYSIS
Bilirubin Urine: NEGATIVE
Hgb urine dipstick: NEGATIVE
Ketones, ur: NEGATIVE
Leukocytes,Ua: NEGATIVE
Nitrite: NEGATIVE
Specific Gravity, Urine: 1.01 (ref 1.000–1.030)
Total Protein, Urine: NEGATIVE
Urine Glucose: NEGATIVE
Urobilinogen, UA: 0.2 (ref 0.0–1.0)
pH: 7 (ref 5.0–8.0)

## 2023-12-07 LAB — MICROALBUMIN / CREATININE URINE RATIO
Creatinine,U: 23.1 mg/dL
Microalb Creat Ratio: UNDETERMINED mg/g (ref 0.0–30.0)
Microalb, Ur: <0.7 mg/dL

## 2023-12-07 LAB — TSH: TSH: 2.04 u[IU]/mL (ref 0.35–5.50)

## 2023-12-07 LAB — VITAMIN B12: Vitamin B-12: 744 pg/mL (ref 211–911)

## 2023-12-07 LAB — VITAMIN D 25 HYDROXY (VIT D DEFICIENCY, FRACTURES): VITD: 73.17 ng/mL (ref 30.00–100.00)

## 2023-12-07 MED ORDER — AMLODIPINE BESYLATE 2.5 MG PO TABS: 2.5000 mg | ORAL_TABLET | Freq: Every day | ORAL | 1 refills | Status: DC

## 2023-12-07 NOTE — Patient Instructions (Addendum)
 We have given your flu vaccine today.   I have sent in amlodipine  2.5 mg for you to take once daily.  We have given your flu vaccine today.   Follow up with me in about 3 months for labs and medication management, sooner if needed.

## 2023-12-07 NOTE — Progress Notes (Signed)
 Established Patient Office Visit  Subjective:     Patient ID: Jeremy Boone, male    DOB: 11-Dec-1934, 89 y.o.   MRN: [id number]  Chief Complaint  Patient presents with   Follow-up    L arm/hand numbness and tingling only when lying down, denies chest pain & SOB. R hip pain, only when lying down also. Still having thick mucus draining, feels like Jeremy Boone gets choked when swallowing. Has been noticing some elevated BP readings at home recently.    HPI  Discussed the use of AI scribe software for clinical note transcription with the patient, who gave verbal consent to proceed.  History of Present Illness Mr. Jeremy Boone is an 89 year old male who presents with hip pain and tingling in the hands.  Hip pain - Persistent right hip pain - Pain worsens at night, particularly when Jeremy Boone loses the pillow used for support  Paresthesia of the hands - Tingling in the hands occurs primarily at night while in bed - Tingling sometimes resolves upon waking - No associated neck pain or shock-like pain, except for one episode of neck pain that resolved with Tylenol  - Occasional puffiness in the right hand, for which Jeremy Boone takes medication as needed  Shoulder pain - Occasional shoulder pain - Attempts to alleviate pain by rubbing the area - Did not undergo planned rotator cuff surgery due to fear  Elevated blood pressure - Elevated blood pressure readings at home, most recently 160/88 mmHg - Not currently on antihypertensive medication - Previously prescribed amlodipine  during a hospital stay - No dizziness, headaches, or swelling in the hands and feet     ROS Per HPI      Objective:    BP (!) 142/82 (BP Location: Left Arm, Patient Position: Sitting, Cuff Size: Normal)   Pulse 66   Temp 98.1 F (36.7 C) (Temporal)   Ht 5' 11 (1.803 m)   Wt 175 lb 12.8 oz (79.7 kg)   SpO2 96%   BMI 24.52 kg/m    Physical Exam Vitals and nursing note reviewed.  Constitutional:      General: Jeremy Boone  is not in acute distress.    Appearance: Normal appearance.  HENT:     Head: Normocephalic and atraumatic.     Right Ear: External ear normal.     Left Ear: External ear normal.     Nose: Nose normal.     Mouth/Throat:     Mouth: Mucous membranes are moist.     Pharynx: Oropharynx is clear.  Eyes:     Extraocular Movements: Extraocular movements intact.  Neck:     Vascular: No carotid bruit.  Cardiovascular:     Rate and Rhythm: Normal rate and regular rhythm.     Pulses: Normal pulses.     Heart sounds: Normal heart sounds.  Pulmonary:     Effort: Pulmonary effort is normal. No respiratory distress.     Breath sounds: Normal breath sounds. No wheezing, rhonchi or rales.  Musculoskeletal:     Cervical back: Normal range of motion.     Right lower leg: No edema.     Left lower leg: No edema.     Comments: R shoulder with LROM with abduction, R hip with LROM, using single point cane with ambulation  Lymphadenopathy:     Cervical: No cervical adenopathy.  Skin:    General: Skin is warm and dry.  Neurological:     General: No focal deficit present.     Mental Status: Jeremy Boone is  alert and oriented to person, place, and time.  Psychiatric:        Mood and Affect: Mood normal.        Behavior: Behavior normal.       BP Readings from Last 3 Encounters:  12/07/23 (!) 142/82  09/06/23 (!) 140/82  06/29/23 136/84   Wt Readings from Last 3 Encounters:  12/07/23 175 lb 12.8 oz (79.7 kg)  09/06/23 178 lb 12.8 oz (81.1 kg)  06/29/23 176 lb (79.8 kg)      Last CBC Lab Results  Component Value Date   WBC 4.0 09/06/2023   HGB 14.4 09/06/2023   HCT 44.1 09/06/2023   MCV 91.5 09/06/2023   MCH 30.7 03/20/2023   RDW 14.5 09/06/2023   PLT 230.0 09/06/2023   Last metabolic panel Lab Results  Component Value Date   GLUCOSE 76 09/06/2023   NA 139 09/06/2023   K 4.1 09/06/2023   CL 103 09/06/2023   CO2 28 09/06/2023   BUN 19 09/06/2023   CREATININE 0.97 09/06/2023   GFR  70.21 09/06/2023   CALCIUM  9.5 09/06/2023   PROT 8.2 09/06/2023   ALBUMIN 4.5 09/06/2023   BILITOT 0.4 09/06/2023   ALKPHOS 60 09/06/2023   AST 18 09/06/2023   ALT 6 09/06/2023   ANIONGAP 8 03/20/2023   Last lipids Lab Results  Component Value Date   CHOL 188 09/06/2023   HDL 86.50 09/06/2023   LDLCALC 90 09/06/2023   TRIG 62.0 09/06/2023   CHOLHDL 2 09/06/2023   Last hemoglobin A1c Lab Results  Component Value Date   HGBA1C 6.2 09/06/2023         Assessment & Plan:   Assessment and Plan Assessment & Plan Right hip pain Chronic pain, possibly due to improper sleep support. - Recommend specialized pillow for hip support during sleep. - Refer to physical therapy.  Right shoulder pain  with hand numbness and tingling Intermittent pain and numbness, likely nerve compression. - Refer to physical therapy.  Primary Hypertension Elevated home blood pressure, not on medication, previously tolerated medication well. - Prescribe low-dose antihypertensive.  Medication Management - labs today, will dose adjust as needed  Immunization Due Received flu vaccination. - Order routine laboratory tests.     Orders Placed This Encounter  Procedures   Flu vaccine HIGH DOSE PF(Fluzone Trivalent)   CBC with Differential/Platelet    Release to patient:   Immediate [1]   Comprehensive metabolic panel with GFR    Release to patient:   Immediate [1]   Microalbumin / creatinine urine ratio    Release to patient:   Immediate   TSH   Vitamin B12   VITAMIN D  25 Hydroxy (Vit-D Deficiency, Fractures)   Urinalysis   Ambulatory referral to Physical Therapy    Referral Priority:   Routine    Referral Type:   Physical Medicine    Referral Reason:   Specialty Services Required    Requested Specialty:   Physical Therapy    Number of Visits Requested:   1     Meds ordered this encounter  Medications   amLODipine  (NORVASC ) 2.5 MG tablet    Sig: Take 1 tablet (2.5 mg total) by  mouth daily.    Dispense:  90 tablet    Refill:  1    Return in about 3 months (around 03/08/2024).  Corean LITTIE Ku, FNP

## 2023-12-09 ENCOUNTER — Ambulatory Visit: Payer: Self-pay | Admitting: Family Medicine

## 2023-12-10 ENCOUNTER — Ambulatory Visit: Payer: Self-pay

## 2023-12-10 NOTE — Telephone Encounter (Signed)
  FYI Only or Action Required?: FYI only for provider.  Patient was last seen in primary care on 12/07/2023 by Alvia Corean CROME, FNP.  Called Nurse Triage reporting Results.  Symptoms began today.  Interventions attempted: Other: n/a.  Symptoms are: n/a.  Triage Disposition: Information or Advice Only Call  Patient/caregiver understands and will follow disposition?: Yes   Copied from CRM [id number]. Topic: Clinical - Lab/Test Results >> Dec 10, 2023  1:56 PM Martinique E wrote: Reason for CRM: Patient has further questions regarding blood sugar from labs. Reason for Disposition  General information question, no triage required and triager able to answer question  Answer Assessment - Initial Assessment Questions 1. REASON FOR CALL: What is the main reason for your call? or How can I best help you?     Lab results Patient was not fasting before last labs 2. SYMPTOMS : Do you have any symptoms?      N/A 3. OTHER QUESTIONS: Do you have any other questions? Results read per Corean Alvia FNP:     Metabolic shows blood sugar mildly elevated at 115, sodium, potassium, kidney function and liver function are all normal.    CBC is normal, no infection or anemia today.   No protein in your urine, urinalysis normal   Thyroid  function is normal.   Vitamin B12 is normal, Vitamin D  is normal. CBC with Differential/Platelet; Comprehensive  Patient verbalizes understanding  Protocols used: Information Only Call - No Triage-A-AH

## 2023-12-13 NOTE — Telephone Encounter (Signed)
 Lab results given by triager

## 2023-12-20 ENCOUNTER — Ambulatory Visit

## 2023-12-21 ENCOUNTER — Other Ambulatory Visit: Payer: Self-pay

## 2023-12-21 ENCOUNTER — Ambulatory Visit: Attending: Family Medicine

## 2023-12-21 DIAGNOSIS — M6281 Muscle weakness (generalized): Secondary | ICD-10-CM | POA: Diagnosis present

## 2023-12-21 DIAGNOSIS — M25511 Pain in right shoulder: Secondary | ICD-10-CM | POA: Diagnosis present

## 2023-12-21 DIAGNOSIS — G8929 Other chronic pain: Secondary | ICD-10-CM | POA: Insufficient documentation

## 2023-12-21 DIAGNOSIS — R2689 Other abnormalities of gait and mobility: Secondary | ICD-10-CM | POA: Insufficient documentation

## 2023-12-21 DIAGNOSIS — M25551 Pain in right hip: Secondary | ICD-10-CM | POA: Insufficient documentation

## 2023-12-21 DIAGNOSIS — R293 Abnormal posture: Secondary | ICD-10-CM | POA: Diagnosis present

## 2023-12-21 NOTE — Therapy (Signed)
 OUTPATIENT PHYSICAL THERAPY SHOULDER EVALUATION   Patient Name: Jeremy Boone MRN: [id number] DOB:24-Sep-1934, 89 y.o., male Today's Date: 12/22/2023  END OF SESSION:  PT End of Session - 12/22/23 0813     Visit Number 1    Number of Visits 17    Date for Recertification  02/16/24    Authorization Type [employer] MCR    PT Start Time 1430    PT Stop Time 1510    PT Time Calculation (min) 40 min    Activity Tolerance Patient tolerated treatment well    Behavior During Therapy [facility] for tasks assessed/performed          Past Medical History:  Diagnosis Date   Alcohol consumption of four to five drinks per day    Gout    Hyperlipidemia    Hypertension    OSA on CPAP    Prediabetes    Recurrent spontaneous pneumothorax 12/2022   Initial pneumothorax October 2024 treated with chest tubes; readmitted January 2024 current spontaneous pneumothorax   Past Surgical History:  Procedure Laterality Date   Cardiac Event Monitor  May-June 2017   Sinus rhythm with average heart rate 74.2 - no notable arrhythmias. Minimal PVCs. No PACs.   CARPAL TUNNEL RELEASE Left 2007   CATARACT EXTRACTION, BILATERAL     OTHER SURGICAL HISTORY  2007   Negative biopsy tonsil mass   ROTATOR CUFF REPAIR Left 2000   Patient Active Problem List   Diagnosis Date Noted   Immunization due 12/07/2023   Nasal congestion 09/06/2023   Right hip pain 09/06/2023   Allergic rhinitis 06/03/2023   BMI 24.0-24.9, adult 06/03/2023   Leg swelling 03/27/2023   Left carotid bruit 03/27/2023   Mild aortic stenosis by prior echocardiogram 12/25/2022   Spontaneous pneumothorax 12/24/2022   Demand ischemia (HCC) 12/24/2022   Alcohol use 12/24/2022   Insomnia 06/27/2020   Statin myopathy 11/21/2019   Constipation 12/15/2017   Palpitations 07/31/2015   OSA on CPAP 04/19/2015   Vitamin D  deficiency 09/14/2013   Medication management 09/14/2013   History of colonic polyps 08/02/2013   Gastroesophageal reflux disease  without esophagitis 08/02/2013   Hyperlipidemia, mixed    Essential hypertension    Idiopathic gout    Abnormal glucose     PCP: Alvia Corean CROME, FNP  REFERRING PROVIDER: Alvia Corean CROME, FNP  REFERRING DIAG:  [phone] (ICD-10-CM) - Chronic right shoulder pain M25.551 (ICD-10-CM) - Right hip pain  THERAPY DIAG:  Chronic right shoulder pain  Muscle weakness (generalized)  Abnormal posture  Pain in right hip  Other abnormalities of gait and mobility  Rationale for Evaluation and Treatment: Rehabilitation  ONSET DATE: Chronic  SUBJECTIVE:  SUBJECTIVE STATEMENT: Pt presents to PT with reports of chronic R shoulder pain and recent onset of R hip pain. R shoulder is main compliant today overall, hip pain is only present when he wakes up at night. Pain is worst with OH reaching and lifting of R shoulder, limiting overall function. Has some occasional N/T in L hand, has had carpal tunnel release in past. Notes that he had his L RTC repaired about 25 years ago, was supposed to also have R repaired but did not want to pursue surgery.   Hand dominance: Right  PERTINENT HISTORY: HTN  PAIN:  Are you having pain?  Yes: NPRS scale: 1/10 Worst: 6/10 Pain location: R shoulder Pain description: sharp, sore, tight Aggravating factors: OH reaching, lifting Relieving factors: rest  PRECAUTIONS: None  RED FLAGS: None   WEIGHT BEARING RESTRICTIONS: No  FALLS:  Has patient fallen in last 6 months? No   LIVING ENVIRONMENT: Lives with: lives alone Lives in: House/apartment Stairs: Yes: Internal: 10 steps; on right going up Has following equipment at home: Single point cane  OCCUPATION: Retired  PLOF: Independent with basic ADLs  PATIENT GOALS: patient wants to decrease  difficulty with hygiene and dressing in regards to R shoulder  NEXT MD VISIT:   OBJECTIVE:  Note: Objective measures were completed at Evaluation unless otherwise noted.  DIAGNOSTIC FINDINGS:  N/A  PATIENT SURVEYS:  Quick Dash: 52.3%  COGNITION: Overall cognitive status: Within functional limits for tasks assessed     SENSATION: WFL  POSTURE: Rounded shoulders, forward head  UPPER EXTREMITY ROM:   Active ROM Right eval Left eval  Shoulder flexion 70 150  Shoulder extension    Shoulder abduction    Shoulder adduction    Shoulder internal rotation    Shoulder external rotation 55 15  Elbow flexion    Elbow extension    Wrist flexion    Wrist extension    Wrist ulnar deviation    Wrist radial deviation    Wrist pronation    Wrist supination    (Blank rows = not tested)  UPPER EXTREMITY MMT:  MMT Right eval Left eval  Shoulder flexion 2+ 3+  Shoulder extension    Shoulder abduction    Shoulder adduction    Shoulder internal rotation 3 4  Shoulder external rotation 3 4  Middle trapezius    Lower trapezius    Elbow flexion    Elbow extension    Wrist flexion    Wrist extension    Wrist ulnar deviation    Wrist radial deviation    Wrist pronation    Wrist supination    Grip strength (lbs)    (Blank rows = not tested)  SHOULDER SPECIAL TESTS: Impingement tests: Painful arc test: positive   JOINT MOBILITY TESTING:  R GH hypmobility, decreased scapular elevation  PALPATION:  TTP to R upper trap, slightly in R infraspinatus and rhomboids   TREATMENT: [facility] Adult PT Treatment:                                                DATE: 12/21/2023 Therapeutic Exercise: Seated scapular retraction x 5 Seated row GTB x 10 Seated shoulder flexion table x 5 R R shoulder ER/IR x 5 isometric   PATIENT EDUCATION: Education details: eval findings, Quick DASH, HEP, POC Person educated: Patient Education method: Explanation, Demonstration, and  Handouts Education comprehension: verbalized understanding and returned demonstration  HOME EXERCISE PROGRAM: Access Code: [email or web address] URL: [email or web address] Date: 12/21/2023 Prepared by: Alm Kingdom  Exercises - Seated Scapular Retraction  - 1 x daily - 7 x weekly - 3 sets - 10 reps - 3 sec hold - Scapular Retraction with Resistance  - 1 x daily - 7 x weekly - 3 sets - 10 reps - green band hold - Seated Shoulder Flexion Towel Slide at Table Top  - 1 x daily - 7 x weekly - 2 sets - 10 reps - 5 sec hold - Standing Isometric Shoulder External Rotation with Doorway and Towel Roll  - 1 x daily - 7 x weekly - 2 sets - 10 reps - 5 sec hold - Standing Isometric Shoulder Internal Rotation with Towel Roll at Doorway  - 1 x daily - 7 x weekly - 2 sets - 10 reps - 5 sec hold  ASSESSMENT:  CLINICAL IMPRESSION: Patient is a 89 y.o. M who was seen today for physical therapy evaluation and treatment for chronic R shoulder pain and discomfort. Also notes chronic R hip pain when waking up at night but states shoulder is chief compliant today. Physical findings are consistent with referring provider impression as pt demonstrates decrease in R shoulder AROM, R shoulder strength, and decrease in functional ability. Quick DASH demonstrates severe disability in performance of home ADLs and community activities. Pt would benefit from skilled PT services working on improving R shoulder strength and ROM in order to decrease pain and improve function.   OBJECTIVE IMPAIRMENTS: Abnormal gait, decreased activity tolerance, decreased balance, decreased endurance, decreased mobility, difficulty walking, decreased ROM, decreased strength, impaired UE functional use, postural dysfunction, and pain  ACTIVITY LIMITATIONS: carrying, lifting, standing, squatting, stairs, transfers, dressing, reach over head, and hygiene/grooming  PARTICIPATION LIMITATIONS: meal prep, driving, shopping, community activity, and  yard work  PERSONAL FACTORS: Time since onset of injury/illness/exacerbation are also affecting patient's functional outcome.   REHAB POTENTIAL: Good  CLINICAL DECISION MAKING: Evolving/moderate complexity  EVALUATION COMPLEXITY: Moderate  GOALS: Goals reviewed with patient? No  SHORT TERM GOALS: Target date: 01/11/2024   Pt will be compliant and knowledgeable with initial HEP for improved comfort and carryover Baseline: initial HEP given  Goal status: INITIAL  2.  Pt will self report right shoulder pain no greater than 4/10 for improved comfort and functional ability Baseline: 6/10 at worst Goal status: INITIAL   LONG TERM GOALS: Target date: 02/16/2024   Pt will decrease Quick DASH disability score to no greater than 40% as proxy for functional improvement Baseline: 52.3%  Goal status: INITIAL  2.  Pt will self report R shoulder pain no greater than 2/10 for improved comfort and functional ability Baseline: 6/10 at worst Goal status: INITIAL   3.  Pt will improve R shoulder flexion AROM to at least 115 degrees for improved comfort and functional ability Baseline: 70 degrees Goal status: INITIAL  4.  Pt will improve R shoulder MMT to at least 3+/5 for all tested motions for improved comfort and function Baseline:  Goal status: INITIAL   PLAN:  PT FREQUENCY: 1-2x/week  PT DURATION: 8 weeks  PLANNED INTERVENTIONS: 97164- PT Re-evaluation, 97110-Therapeutic exercises, 97530- Therapeutic activity, [id number]- Neuromuscular re-education, 97535- Self Care, [id number]- Manual therapy, [id number]- Gait training, [phone]- Electrical stimulation (unattended), [id number]- Electrical stimulation (manual), 97016- Vasopneumatic device, 20560 (1-2 muscles), 20561 (3+ muscles)- Dry Needling, and Patient/Family education  PLAN FOR NEXT SESSION: assess HEP response, periscapular strengthening, shoulder  AAROM and AROM  Date of referral: 12/07/2023 Referring provider: Alvia Corean CROME, FNP   Referring diagnosis?  M25.511,G89.29 (ICD-10-CM) - Chronic right shoulder pain M25.551 (ICD-10-CM) - Right hip pain  Treatment diagnosis? (if different than referring diagnosis)  Chronic right shoulder pain Muscle weakness (generalized) Abnormal posture Pain in right hip Other abnormalities of gait and mobility  What was this (referring dx) caused by? Ongoing Issue and Arthritis  Lysle of Condition: Chronic (continuous duration > 3 months)   Laterality: Rt  Current Functional Measure Score: DASH 52.3%  Objective measurements identify impairments when they are compared to normal values, the uninvolved extremity, and prior level of function.  [x]  Yes  []  No  Objective assessment of functional ability: Severe functional limitations   Briefly describe symptoms:  Pt presents to PT with reports of chronic R shoulder pain and recent onset of R hip pain. R shoulder is main compliant today overall, hip pain is only present when he wakes up at night. Pain is worst with OH reaching and lifting of R shoulder, limiting overall function. Has some occasional N/T in L hand, has had carpal tunnel release in past. Notes that he had his L RTC repaired about 25 years ago, was supposed to also have R repaired but did not want to pursue surgery.   How did symptoms start: Chronic pain over last 25+ years with right shoulder  Average pain intensity:  Last 24 hours: 3/10  Past week: 5/10  How often does the pt experience symptoms? Constantly  How much have the symptoms interfered with usual daily activities? Quite a bit  How has condition changed since care began at this facility? NA - initial visit  In general, how is the patients overall health? Good   BACK PAIN (STarT Back Screening Tool) No     Alm JAYSON Kingdom, PT 12/22/2023, 8:13 AM

## 2023-12-28 ENCOUNTER — Ambulatory Visit

## 2023-12-28 DIAGNOSIS — R293 Abnormal posture: Secondary | ICD-10-CM

## 2023-12-28 DIAGNOSIS — G8929 Other chronic pain: Secondary | ICD-10-CM

## 2023-12-28 DIAGNOSIS — M6281 Muscle weakness (generalized): Secondary | ICD-10-CM

## 2023-12-28 DIAGNOSIS — M25511 Pain in right shoulder: Secondary | ICD-10-CM | POA: Diagnosis not present

## 2023-12-28 NOTE — Therapy (Signed)
 OUTPATIENT PHYSICAL THERAPY TREATMENT   Patient Name: Areon Cocuzza MRN: [id number] DOB:1934/12/26, 89 y.o., male Today's Date: 12/29/2023  END OF SESSION:  PT End of Session - 12/28/23 1446     Visit Number 2    Number of Visits 17    Date for Recertification  02/16/24    Authorization Type [employer] MCR    PT Start Time 1446    PT Stop Time 1524    PT Time Calculation (min) 38 min    Activity Tolerance Patient tolerated treatment well    Behavior During Therapy WFL for tasks assessed/performed           Past Medical History:  Diagnosis Date   Alcohol consumption of four to five drinks per day    Gout    Hyperlipidemia    Hypertension    OSA on CPAP    Prediabetes    Recurrent spontaneous pneumothorax 12/2022   Initial pneumothorax October 2024 treated with chest tubes; readmitted January 2024 current spontaneous pneumothorax   Past Surgical History:  Procedure Laterality Date   Cardiac Event Monitor  May-June 2017   Sinus rhythm with average heart rate 74.2 - no notable arrhythmias. Minimal PVCs. No PACs.   CARPAL TUNNEL RELEASE Left 2007   CATARACT EXTRACTION, BILATERAL     OTHER SURGICAL HISTORY  2007   Negative biopsy tonsil mass   ROTATOR CUFF REPAIR Left 2000   Patient Active Problem List   Diagnosis Date Noted   Immunization due 12/07/2023   Nasal congestion 09/06/2023   Right hip pain 09/06/2023   Allergic rhinitis 06/03/2023   BMI 24.0-24.9, adult 06/03/2023   Leg swelling 03/27/2023   Left carotid bruit 03/27/2023   Mild aortic stenosis by prior echocardiogram 12/25/2022   Spontaneous pneumothorax 12/24/2022   Demand ischemia (HCC) 12/24/2022   Alcohol use 12/24/2022   Insomnia 06/27/2020   Statin myopathy 11/21/2019   Constipation 12/15/2017   Palpitations 07/31/2015   OSA on CPAP 04/19/2015   Vitamin D  deficiency 09/14/2013   Medication management 09/14/2013   History of colonic polyps 08/02/2013   Gastroesophageal reflux disease without  esophagitis 08/02/2013   Hyperlipidemia, mixed    Essential hypertension    Idiopathic gout    Abnormal glucose     PCP: Alvia Corean CROME, FNP  REFERRING PROVIDER: Alvia Corean CROME, FNP  REFERRING DIAG:  [phone] (ICD-10-CM) - Chronic right shoulder pain M25.551 (ICD-10-CM) - Right hip pain  THERAPY DIAG:  Chronic right shoulder pain  Muscle weakness (generalized)  Abnormal posture  Rationale for Evaluation and Treatment: Rehabilitation  ONSET DATE: Chronic  SUBJECTIVE:  SUBJECTIVE STATEMENT: Pt presents to PT with no current pain. Has been compliant with HEP.   EVAL: Pt presents to PT with reports of chronic R shoulder pain and recent onset of R hip pain. R shoulder is main compliant today overall, hip pain is only present when he wakes up at night. Pain is worst with OH reaching and lifting of R shoulder, limiting overall function. Has some occasional N/T in L hand, has had carpal tunnel release in past. Notes that he had his L RTC repaired about 25 years ago, was supposed to also have R repaired but did not want to pursue surgery.   Hand dominance: Right  PERTINENT HISTORY: HTN  PAIN:  Are you having pain?  Yes: NPRS scale: 1/10 Worst: 6/10 Pain location: R shoulder Pain description: sharp, sore, tight Aggravating factors: OH reaching, lifting Relieving factors: rest  PRECAUTIONS: None  RED FLAGS: None   WEIGHT BEARING RESTRICTIONS: No  FALLS:  Has patient fallen in last 6 months? No   LIVING ENVIRONMENT: Lives with: lives alone Lives in: House/apartment Stairs: Yes: Internal: 10 steps; on right going up Has following equipment at home: Single point cane  OCCUPATION: Retired  PLOF: Independent with basic ADLs  PATIENT GOALS: patient wants to decrease  difficulty with hygiene and dressing in regards to R shoulder  NEXT MD VISIT:   OBJECTIVE:  Note: Objective measures were completed at Evaluation unless otherwise noted.  DIAGNOSTIC FINDINGS:  N/A  PATIENT SURVEYS:  Quick Dash: 52.3%  COGNITION: Overall cognitive status: Within functional limits for tasks assessed     SENSATION: WFL  POSTURE: Rounded shoulders, forward head  UPPER EXTREMITY ROM:   Active ROM Right eval Left eval  Shoulder flexion 70 150  Shoulder extension    Shoulder abduction    Shoulder adduction    Shoulder internal rotation    Shoulder external rotation 55 15  Elbow flexion    Elbow extension    Wrist flexion    Wrist extension    Wrist ulnar deviation    Wrist radial deviation    Wrist pronation    Wrist supination    (Blank rows = not tested)  UPPER EXTREMITY MMT:  MMT Right eval Left eval  Shoulder flexion 2+ 3+  Shoulder extension    Shoulder abduction    Shoulder adduction    Shoulder internal rotation 3 4  Shoulder external rotation 3 4  Middle trapezius    Lower trapezius    Elbow flexion    Elbow extension    Wrist flexion    Wrist extension    Wrist ulnar deviation    Wrist radial deviation    Wrist pronation    Wrist supination    Grip strength (lbs)    (Blank rows = not tested)  SHOULDER SPECIAL TESTS: Impingement tests: Painful arc test: positive   JOINT MOBILITY TESTING:  R GH hypmobility, decreased scapular elevation  PALPATION:  TTP to R upper trap, slightly in R infraspinatus and rhomboids   TREATMENT: [facility] Adult PT Treatment:                                                DATE: 12/28/2023 Supine shoulder flexion 2x10 R Supine horizontal abd 2x10 RTB Seated bilateral ER - difficult  FM row 2x10 13# Seated cane ER AAROM 2x10 ea Seated table slide flexion  2x10 R  Seated bicep curl 2x10 5# R  [facility] Adult PT Treatment:                                                DATE: 12/21/2023 Therapeutic  Exercise: Seated scapular retraction x 5 Seated row GTB x 10 Seated shoulder flexion table x 5 R R shoulder ER/IR x 5 isometric   PATIENT EDUCATION: Education details: eval findings, Quick DASH, HEP, POC Person educated: Patient Education method: Explanation, Demonstration, and Handouts Education comprehension: verbalized understanding and returned demonstration  HOME EXERCISE PROGRAM: Access Code: [email or web address] URL: [email or web address] Date: 12/21/2023 Prepared by: Alm Kingdom  Exercises - Seated Scapular Retraction  - 1 x daily - 7 x weekly - 3 sets - 10 reps - 3 sec hold - Scapular Retraction with Resistance  - 1 x daily - 7 x weekly - 3 sets - 10 reps - green band hold - Seated Shoulder Flexion Towel Slide at Table Top  - 1 x daily - 7 x weekly - 2 sets - 10 reps - 5 sec hold - Standing Isometric Shoulder External Rotation with Doorway and Towel Roll  - 1 x daily - 7 x weekly - 2 sets - 10 reps - 5 sec hold - Standing Isometric Shoulder Internal Rotation with Towel Roll at Doorway  - 1 x daily - 7 x weekly - 2 sets - 10 reps - 5 sec hold  ASSESSMENT:  CLINICAL IMPRESSION: Pt was able to complete prescribed exercises with no adverse effect. Today we focused on shoulder strengthening to improve functional ability. HEP was updated for progression of flexion work. Continues to benefit from skilled PT services, will continue per POC.   EVAL: Patient is a 89 y.o. M who was seen today for physical therapy evaluation and treatment for chronic R shoulder pain and discomfort. Also notes chronic R hip pain when waking up at night but states shoulder is chief compliant today. Physical findings are consistent with referring provider impression as pt demonstrates decrease in R shoulder AROM, R shoulder strength, and decrease in functional ability. Quick DASH demonstrates severe disability in performance of home ADLs and community activities. Pt would benefit from skilled PT services  working on improving R shoulder strength and ROM in order to decrease pain and improve function.   OBJECTIVE IMPAIRMENTS: Abnormal gait, decreased activity tolerance, decreased balance, decreased endurance, decreased mobility, difficulty walking, decreased ROM, decreased strength, impaired UE functional use, postural dysfunction, and pain  ACTIVITY LIMITATIONS: carrying, lifting, standing, squatting, stairs, transfers, dressing, reach over head, and hygiene/grooming  PARTICIPATION LIMITATIONS: meal prep, driving, shopping, community activity, and yard work  PERSONAL FACTORS: Time since onset of injury/illness/exacerbation are also affecting patient's functional outcome.   REHAB POTENTIAL: Good  CLINICAL DECISION MAKING: Evolving/moderate complexity  EVALUATION COMPLEXITY: Moderate  GOALS: Goals reviewed with patient? No  SHORT TERM GOALS: Target date: 01/11/2024   Pt will be compliant and knowledgeable with initial HEP for improved comfort and carryover Baseline: initial HEP given  Goal status: INITIAL  2.  Pt will self report right shoulder pain no greater than 4/10 for improved comfort and functional ability Baseline: 6/10 at worst Goal status: INITIAL   LONG TERM GOALS: Target date: 02/16/2024   Pt will decrease Quick DASH disability score to no greater than 40% as proxy for functional improvement Baseline: 52.3%  Goal  status: INITIAL  2.  Pt will self report R shoulder pain no greater than 2/10 for improved comfort and functional ability Baseline: 6/10 at worst Goal status: INITIAL   3.  Pt will improve R shoulder flexion AROM to at least 115 degrees for improved comfort and functional ability Baseline: 70 degrees Goal status: INITIAL  4.  Pt will improve R shoulder MMT to at least 3+/5 for all tested motions for improved comfort and function Baseline:  Goal status: INITIAL   PLAN:  PT FREQUENCY: 1-2x/week  PT DURATION: 8 weeks  PLANNED INTERVENTIONS: 97164-  PT Re-evaluation, 97110-Therapeutic exercises, 97530- Therapeutic activity, [id number]- Neuromuscular re-education, 97535- Self Care, 02859- Manual therapy, [id number]- Gait training, [phone]- Electrical stimulation (unattended), [id number]- Electrical stimulation (manual), 97016- Vasopneumatic device, 20560 (1-2 muscles), 20561 (3+ muscles)- Dry Needling, and Patient/Family education  PLAN FOR NEXT SESSION: assess HEP response, periscapular strengthening, shoulder AAROM and AROM  Date of referral: 12/07/2023 Referring provider: Alvia Corean CROME, FNP  Referring diagnosis?  M25.511,G89.29 (ICD-10-CM) - Chronic right shoulder pain M25.551 (ICD-10-CM) - Right hip pain  Treatment diagnosis? (if different than referring diagnosis)  Chronic right shoulder pain Muscle weakness (generalized) Abnormal posture Pain in right hip Other abnormalities of gait and mobility  What was this (referring dx) caused by? Ongoing Issue and Arthritis  Lysle of Condition: Chronic (continuous duration > 3 months)   Laterality: Rt  Current Functional Measure Score: DASH 52.3%  Objective measurements identify impairments when they are compared to normal values, the uninvolved extremity, and prior level of function.  [x]  Yes  []  No  Objective assessment of functional ability: Severe functional limitations   Briefly describe symptoms:  Pt presents to PT with reports of chronic R shoulder pain and recent onset of R hip pain. R shoulder is main compliant today overall, hip pain is only present when he wakes up at night. Pain is worst with OH reaching and lifting of R shoulder, limiting overall function. Has some occasional N/T in L hand, has had carpal tunnel release in past. Notes that he had his L RTC repaired about 25 years ago, was supposed to also have R repaired but did not want to pursue surgery.   How did symptoms start: Chronic pain over last 25+ years with right shoulder  Average pain intensity:  Last 24 hours:  3/10  Past week: 5/10  How often does the pt experience symptoms? Constantly  How much have the symptoms interfered with usual daily activities? Quite a bit  How has condition changed since care began at this facility? NA - initial visit  In general, how is the patients overall health? Good   BACK PAIN (STarT Back Screening Tool) No     Alm JAYSON Kingdom, PT 12/29/2023, 7:56 AM

## 2024-01-03 ENCOUNTER — Ambulatory Visit: Attending: Family Medicine

## 2024-01-03 DIAGNOSIS — G8929 Other chronic pain: Secondary | ICD-10-CM | POA: Diagnosis present

## 2024-01-03 DIAGNOSIS — M6281 Muscle weakness (generalized): Secondary | ICD-10-CM | POA: Insufficient documentation

## 2024-01-03 DIAGNOSIS — R293 Abnormal posture: Secondary | ICD-10-CM | POA: Insufficient documentation

## 2024-01-03 DIAGNOSIS — M25511 Pain in right shoulder: Secondary | ICD-10-CM | POA: Insufficient documentation

## 2024-01-03 NOTE — Therapy (Signed)
 OUTPATIENT PHYSICAL THERAPY TREATMENT   Patient Name: Jeremy Boone MRN: [id number] DOB:1934-06-17, 89 y.o., male Today's Date: 01/03/2024  END OF SESSION:  PT End of Session - 01/03/24 1452     Visit Number 3    Number of Visits 17    Date for Recertification  02/16/24    Authorization Type [employer] MCR    PT Start Time 1445    PT Stop Time 1530    PT Time Calculation (min) 45 min    Activity Tolerance Patient tolerated treatment well    Behavior During Therapy [facility] for tasks assessed/performed            Past Medical History:  Diagnosis Date   Alcohol consumption of four to five drinks per day    Gout    Hyperlipidemia    Hypertension    OSA on CPAP    Prediabetes    Recurrent spontaneous pneumothorax 12/2022   Initial pneumothorax October 2024 treated with chest tubes; readmitted January 2024 current spontaneous pneumothorax   Past Surgical History:  Procedure Laterality Date   Cardiac Event Monitor  May-June 2017   Sinus rhythm with average heart rate 74.2 - no notable arrhythmias. Minimal PVCs. No PACs.   CARPAL TUNNEL RELEASE Left 2007   CATARACT EXTRACTION, BILATERAL     OTHER SURGICAL HISTORY  2007   Negative biopsy tonsil mass   ROTATOR CUFF REPAIR Left 2000   Patient Active Problem List   Diagnosis Date Noted   Immunization due 12/07/2023   Nasal congestion 09/06/2023   Right hip pain 09/06/2023   Allergic rhinitis 06/03/2023   BMI 24.0-24.9, adult 06/03/2023   Leg swelling 03/27/2023   Left carotid bruit 03/27/2023   Mild aortic stenosis by prior echocardiogram 12/25/2022   Spontaneous pneumothorax 12/24/2022   Demand ischemia (HCC) 12/24/2022   Alcohol use 12/24/2022   Insomnia 06/27/2020   Statin myopathy 11/21/2019   Constipation 12/15/2017   Palpitations 07/31/2015   OSA on CPAP 04/19/2015   Vitamin D  deficiency 09/14/2013   Medication management 09/14/2013   History of colonic polyps 08/02/2013   Gastroesophageal reflux disease without  esophagitis 08/02/2013   Hyperlipidemia, mixed    Essential hypertension    Idiopathic gout    Abnormal glucose     PCP: Alvia Corean CROME, FNP  REFERRING PROVIDER: Alvia Corean CROME, FNP  REFERRING DIAG:  [phone] (ICD-10-CM) - Chronic right shoulder pain M25.551 (ICD-10-CM) - Right hip pain  THERAPY DIAG:  Chronic right shoulder pain  Abnormal posture  Muscle weakness (generalized)  Rationale for Evaluation and Treatment: Rehabilitation  ONSET DATE: Chronic  SUBJECTIVE:  SUBJECTIVE STATEMENT: Pt presents with no pain, but has pain whenever doing more than 45 degrees of flexion/abduction. Reports feeling a little better since last visit.   EVAL: Pt presents to PT with reports of chronic R shoulder pain and recent onset of R hip pain. R shoulder is main compliant today overall, hip pain is only present when he wakes up at night. Pain is worst with OH reaching and lifting of R shoulder, limiting overall function. Has some occasional N/T in L hand, has had carpal tunnel release in past. Notes that he had his L RTC repaired about 25 years ago, was supposed to also have R repaired but did not want to pursue surgery.   Hand dominance: Right  PERTINENT HISTORY: HTN  PAIN:   6/10 Are you having pain?  Yes: NPRS scale: 1/10 Worst: 6/10 Pain location: R shoulder Pain description: sharp, sore, tight Aggravating factors: OH reaching, lifting Relieving factors: rest  PRECAUTIONS: None  RED FLAGS: None   WEIGHT BEARING RESTRICTIONS: No  FALLS:  Has patient fallen in last 6 months? No   LIVING ENVIRONMENT: Lives with: lives alone Lives in: House/apartment Stairs: Yes: Internal: 10 steps; on right going up Has following equipment at home: Single point  cane  OCCUPATION: Retired  PLOF: Independent with basic ADLs  PATIENT GOALS: patient wants to decrease difficulty with hygiene and dressing in regards to R shoulder  NEXT MD VISIT:   OBJECTIVE:  Note: Objective measures were completed at Evaluation unless otherwise noted.  DIAGNOSTIC FINDINGS:  N/A  PATIENT SURVEYS:  Quick Dash: 52.3%  COGNITION: Overall cognitive status: Within functional limits for tasks assessed     SENSATION: WFL  POSTURE: Rounded shoulders, forward head  UPPER EXTREMITY ROM:   Active ROM Right eval Left eval  Shoulder flexion 70 150  Shoulder extension    Shoulder abduction    Shoulder adduction    Shoulder internal rotation    Shoulder external rotation 55 15  Elbow flexion    Elbow extension    Wrist flexion    Wrist extension    Wrist ulnar deviation    Wrist radial deviation    Wrist pronation    Wrist supination    (Blank rows = not tested)  UPPER EXTREMITY MMT:  MMT Right eval Left eval  Shoulder flexion 2+ 3+  Shoulder extension    Shoulder abduction    Shoulder adduction    Shoulder internal rotation 3 4  Shoulder external rotation 3 4  Middle trapezius    Lower trapezius    Elbow flexion    Elbow extension    Wrist flexion    Wrist extension    Wrist ulnar deviation    Wrist radial deviation    Wrist pronation    Wrist supination    Grip strength (lbs)    (Blank rows = not tested)  SHOULDER SPECIAL TESTS: Impingement tests: Painful arc test: positive   JOINT MOBILITY TESTING:  R GH hypmobility, decreased scapular elevation  PALPATION:  TTP to R upper trap, slightly in R infraspinatus and rhomboids   TREATMENT: NME 01/03/24 *Patient required extra time for exercises due to increased monitoring, reassessment, and rest due to low activity tolerance and/or high irritability of symptoms* *Patient required extra time for exercises due to additional reeducation on pain science, optimal loading, prognosis, and  relevant tissues/anatomy*  Standing Yellow TB row 2x8x3s Standing yellow TB ER 2x8x3s Sitting x8x3s scaption BL raise Seated retraction x8x3s   Therapeutic Exercise HEP reassessment  and update Functional reassessment Pulley x8x3s     [facility] Adult PT Treatment:                                                DATE: 12/28/2023 Supine shoulder flexion 2x10 R Supine horizontal abd 2x10 RTB Seated bilateral ER - difficult  FM row 2x10 13# Seated cane ER AAROM 2x10 ea Seated table slide flexion 2x10 R  Seated bicep curl 2x10 5# R  [facility] Adult PT Treatment:                                                DATE: 12/21/2023 Therapeutic Exercise: Seated scapular retraction x 5 Seated row GTB x 10 Seated shoulder flexion table x 5 R R shoulder ER/IR x 5 isometric   PATIENT EDUCATION: Education details: eval findings, Quick DASH, HEP, POC Person educated: Patient Education method: Explanation, Demonstration, and Handouts Education comprehension: verbalized understanding and returned demonstration  HOME EXERCISE PROGRAM: Access Code: [email or web address] URL: [email or web address] Date: 12/21/2023 Prepared by: Alm Kingdom  Exercises - Seated Scapular Retraction  - 1 x daily - 7 x weekly - 3 sets - 10 reps - 3 sec hold - Scapular Retraction with Resistance  - 1 x daily - 7 x weekly - 3 sets - 10 reps - green band hold - Seated Shoulder Flexion Towel Slide at Table Top  - 1 x daily - 7 x weekly - 2 sets - 10 reps - 5 sec hold - Standing Isometric Shoulder External Rotation with Doorway and Towel Roll  - 1 x daily - 7 x weekly - 2 sets - 10 reps - 5 sec hold - Standing Isometric Shoulder Internal Rotation with Towel Roll at Doorway  - 1 x daily - 7 x weekly - 2 sets - 10 reps - 5 sec hold   11/3  Red TB ER  Red TB IR  Green TB row  5x/wk 1-2x/day, 2x8x3s  ASSESSMENT:  CLINICAL IMPRESSION: Patient tolerated treatment with no significant increases in pain with progressions in  RTC, MT, and lat loading. Current deficits include: R arm ROM, excessive pain, MT engagement/strength (requiring extra cuing to retract scapula), and general functional strength of deltoids and RTC. As a result, patient would continue to benefit from skilled PT to address said deficits via plan below.    EVAL: Patient is a 89 y.o. M who was seen today for physical therapy evaluation and treatment for chronic R shoulder pain and discomfort. Also notes chronic R hip pain when waking up at night but states shoulder is chief compliant today. Physical findings are consistent with referring provider impression as pt demonstrates decrease in R shoulder AROM, R shoulder strength, and decrease in functional ability. Quick DASH demonstrates severe disability in performance of home ADLs and community activities. Pt would benefit from skilled PT services working on improving R shoulder strength and ROM in order to decrease pain and improve function.   OBJECTIVE IMPAIRMENTS: Abnormal gait, decreased activity tolerance, decreased balance, decreased endurance, decreased mobility, difficulty walking, decreased ROM, decreased strength, impaired UE functional use, postural dysfunction, and pain  ACTIVITY LIMITATIONS: carrying, lifting, standing, squatting, stairs, transfers, dressing, reach over head, and hygiene/grooming  PARTICIPATION LIMITATIONS: meal prep, driving,  shopping, community activity, and yard work  PERSONAL FACTORS: Time since onset of injury/illness/exacerbation are also affecting patient's functional outcome.   REHAB POTENTIAL: Good  CLINICAL DECISION MAKING: Evolving/moderate complexity  EVALUATION COMPLEXITY: Moderate  GOALS: Goals reviewed with patient? No  SHORT TERM GOALS: Target date: 01/11/2024   Pt will be compliant and knowledgeable with initial HEP for improved comfort and carryover Baseline: initial HEP given  Goal status: INITIAL  2.  Pt will self report right shoulder pain no  greater than 4/10 for improved comfort and functional ability Baseline: 6/10 at worst Goal status: INITIAL   LONG TERM GOALS: Target date: 02/16/2024   Pt will decrease Quick DASH disability score to no greater than 40% as proxy for functional improvement Baseline: 52.3%  Goal status: INITIAL  2.  Pt will self report R shoulder pain no greater than 2/10 for improved comfort and functional ability Baseline: 6/10 at worst Goal status: INITIAL   3.  Pt will improve R shoulder flexion AROM to at least 115 degrees for improved comfort and functional ability Baseline: 70 degrees Goal status: INITIAL  4.  Pt will improve R shoulder MMT to at least 3+/5 for all tested motions for improved comfort and function Baseline:  Goal status: INITIAL   PLAN:  PT FREQUENCY: 1-2x/week  PT DURATION: 8 weeks  PLANNED INTERVENTIONS: 97164- PT Re-evaluation, 97110-Therapeutic exercises, 97530- Therapeutic activity, [id number]- Neuromuscular re-education, 97535- Self Care, [id number]- Manual therapy, [id number]- Gait training, [phone]- Electrical stimulation (unattended), [id number]- Electrical stimulation (manual), 97016- Vasopneumatic device, 20560 (1-2 muscles), 20561 (3+ muscles)- Dry Needling, and Patient/Family education  PLAN FOR NEXT SESSION: assess HEP response, periscapular strengthening, shoulder AAROM and AROM  Date of referral: 12/07/2023 Referring provider: Alvia Corean CROME, FNP  Referring diagnosis?  M25.511,G89.29 (ICD-10-CM) - Chronic right shoulder pain M25.551 (ICD-10-CM) - Right hip pain  Treatment diagnosis? (if different than referring diagnosis)  Chronic right shoulder pain Muscle weakness (generalized) Abnormal posture Pain in right hip Other abnormalities of gait and mobility  What was this (referring dx) caused by? Ongoing Issue and Arthritis  Lysle of Condition: Chronic (continuous duration > 3 months)   Laterality: Rt  Current Functional Measure Score: DASH 52.3%  Objective  measurements identify impairments when they are compared to normal values, the uninvolved extremity, and prior level of function.  [x]  Yes  []  No  Objective assessment of functional ability: Severe functional limitations   Briefly describe symptoms:  Pt presents to PT with reports of chronic R shoulder pain and recent onset of R hip pain. R shoulder is main compliant today overall, hip pain is only present when he wakes up at night. Pain is worst with OH reaching and lifting of R shoulder, limiting overall function. Has some occasional N/T in L hand, has had carpal tunnel release in past. Notes that he had his L RTC repaired about 25 years ago, was supposed to also have R repaired but did not want to pursue surgery.   How did symptoms start: Chronic pain over last 25+ years with right shoulder  Average pain intensity:  Last 24 hours: 3/10  Past week: 5/10  How often does the pt experience symptoms? Constantly  How much have the symptoms interfered with usual daily activities? Quite a bit  How has condition changed since care began at this facility? NA - initial visit  In general, how is the patients overall health? Good   BACK PAIN (STarT Back Screening Tool) No     [place] Greener  Kaneesha Constantino, PT 01/03/2024, 3:29 PM

## 2024-01-10 ENCOUNTER — Ambulatory Visit

## 2024-01-10 DIAGNOSIS — G8929 Other chronic pain: Secondary | ICD-10-CM

## 2024-01-10 DIAGNOSIS — R293 Abnormal posture: Secondary | ICD-10-CM

## 2024-01-10 DIAGNOSIS — M6281 Muscle weakness (generalized): Secondary | ICD-10-CM

## 2024-01-10 DIAGNOSIS — M25511 Pain in right shoulder: Secondary | ICD-10-CM | POA: Diagnosis not present

## 2024-01-10 NOTE — Therapy (Signed)
 OUTPATIENT PHYSICAL THERAPY TREATMENT   Patient Name: Jeremy Boone MRN: [id number] DOB:09-13-1934, 89 y.o., male Today's Date: 01/10/2024  END OF SESSION:  PT End of Session - 01/10/24 1448     Visit Number 4    Number of Visits 17    Date for Recertification  02/16/24    Authorization Type [employer] MCR    PT Start Time 1446    Activity Tolerance Patient tolerated treatment well    Behavior During Therapy [facility] for tasks assessed/performed             Past Medical History:  Diagnosis Date   Alcohol consumption of four to five drinks per day    Gout    Hyperlipidemia    Hypertension    OSA on CPAP    Prediabetes    Recurrent spontaneous pneumothorax 12/2022   Initial pneumothorax October 2024 treated with chest tubes; readmitted January 2024 current spontaneous pneumothorax   Past Surgical History:  Procedure Laterality Date   Cardiac Event Monitor  May-June 2017   Sinus rhythm with average heart rate 74.2 - no notable arrhythmias. Minimal PVCs. No PACs.   CARPAL TUNNEL RELEASE Left 2007   CATARACT EXTRACTION, BILATERAL     OTHER SURGICAL HISTORY  2007   Negative biopsy tonsil mass   ROTATOR CUFF REPAIR Left 2000   Patient Active Problem List   Diagnosis Date Noted   Immunization due 12/07/2023   Nasal congestion 09/06/2023   Right hip pain 09/06/2023   Allergic rhinitis 06/03/2023   BMI 24.0-24.9, adult 06/03/2023   Leg swelling 03/27/2023   Left carotid bruit 03/27/2023   Mild aortic stenosis by prior echocardiogram 12/25/2022   Spontaneous pneumothorax 12/24/2022   Demand ischemia (HCC) 12/24/2022   Alcohol use 12/24/2022   Insomnia 06/27/2020   Statin myopathy 11/21/2019   Constipation 12/15/2017   Palpitations 07/31/2015   OSA on CPAP 04/19/2015   Vitamin D  deficiency 09/14/2013   Medication management 09/14/2013   History of colonic polyps 08/02/2013   Gastroesophageal reflux disease without esophagitis 08/02/2013   Hyperlipidemia, mixed     Essential hypertension    Idiopathic gout    Abnormal glucose     PCP: Alvia Corean CROME, FNP  REFERRING PROVIDER: Alvia Corean CROME, FNP  REFERRING DIAG:  [phone] (ICD-10-CM) - Chronic right shoulder pain M25.551 (ICD-10-CM) - Right hip pain  THERAPY DIAG:  Chronic right shoulder pain  Abnormal posture  Muscle weakness (generalized)  Rationale for Evaluation and Treatment: Rehabilitation  ONSET DATE: Chronic  SUBJECTIVE:  SUBJECTIVE STATEMENT: Pt presents to PT with R shoulder pain 5/10. Has been compliant with HEP.   EVAL: Pt presents to PT with reports of chronic R shoulder pain and recent onset of R hip pain. R shoulder is main compliant today overall, hip pain is only present when he wakes up at night. Pain is worst with OH reaching and lifting of R shoulder, limiting overall function. Has some occasional N/T in L hand, has had carpal tunnel release in past. Notes that he had his L RTC repaired about 25 years ago, was supposed to also have R repaired but did not want to pursue surgery.   Hand dominance: Right  PERTINENT HISTORY: HTN  PAIN:   6/10 Are you having pain?  Yes: NPRS scale: 1/10 Worst: 6/10 Pain location: R shoulder Pain description: sharp, sore, tight Aggravating factors: OH reaching, lifting Relieving factors: rest  PRECAUTIONS: None  RED FLAGS: None   WEIGHT BEARING RESTRICTIONS: No  FALLS:  Has patient fallen in last 6 months? No   LIVING ENVIRONMENT: Lives with: lives alone Lives in: House/apartment Stairs: Yes: Internal: 10 steps; on right going up Has following equipment at home: Single point cane  OCCUPATION: Retired  PLOF: Independent with basic ADLs  PATIENT GOALS: patient wants to decrease difficulty with hygiene and dressing in  regards to R shoulder  NEXT MD VISIT:   OBJECTIVE:  Note: Objective measures were completed at Evaluation unless otherwise noted.  DIAGNOSTIC FINDINGS:  N/A  PATIENT SURVEYS:  Quick Dash: 52.3%  COGNITION: Overall cognitive status: Within functional limits for tasks assessed     SENSATION: WFL  POSTURE: Rounded shoulders, forward head  UPPER EXTREMITY ROM:   Active ROM Right eval Left eval  Shoulder flexion 70 150  Shoulder extension    Shoulder abduction    Shoulder adduction    Shoulder internal rotation    Shoulder external rotation 55 15  Elbow flexion    Elbow extension    Wrist flexion    Wrist extension    Wrist ulnar deviation    Wrist radial deviation    Wrist pronation    Wrist supination    (Blank rows = not tested)  UPPER EXTREMITY MMT:  MMT Right eval Left eval  Shoulder flexion 2+ 3+  Shoulder extension    Shoulder abduction    Shoulder adduction    Shoulder internal rotation 3 4  Shoulder external rotation 3 4  Middle trapezius    Lower trapezius    Elbow flexion    Elbow extension    Wrist flexion    Wrist extension    Wrist ulnar deviation    Wrist radial deviation    Wrist pronation    Wrist supination    Grip strength (lbs)    (Blank rows = not tested)  SHOULDER SPECIAL TESTS: Impingement tests: Painful arc test: positive   JOINT MOBILITY TESTING:  R GH hypmobility, decreased scapular elevation  PALPATION:  TTP to R upper trap, slightly in R infraspinatus and rhomboids   TREATMENT: [facility] Adult PT Treatment:                                                DATE: 01/10/2024 UBE lvl 1.5 x 4 min for functional activity tolerance Pulley's shoulder flexion 2x60 Supine shoulder flexion 3x10 R Supine fwd punch 3x10 2# Supine horizontal abd  x 5 RTB - difficult R shoulder IR/ER 2x10 YTB Standing row 3x10 blue band Standing ext 2x10 GTB Seated cane ER AAROM 2x10 ea  NME 01/03/24 *Patient required extra time for exercises  due to increased monitoring, reassessment, and rest due to low activity tolerance and/or high irritability of symptoms* *Patient required extra time for exercises due to additional reeducation on pain science, optimal loading, prognosis, and relevant tissues/anatomy*  Standing Yellow TB row 2x8x3s Standing yellow TB ER 2x8x3s Sitting x8x3s scaption BL raise Seated retraction x8x3s   Therapeutic Exercise HEP reassessment and update Functional reassessment Pulley x8x3s     [facility] Adult PT Treatment:                                                DATE: 12/28/2023 Supine shoulder flexion 2x10 R Supine horizontal abd 2x10 RTB Seated bilateral ER - difficult  FM row 2x10 13# Seated cane ER AAROM 2x10 ea Seated table slide flexion 2x10 R  Seated bicep curl 2x10 5# R  [facility] Adult PT Treatment:                                                DATE: 12/21/2023 Therapeutic Exercise: Seated scapular retraction x 5 Seated row GTB x 10 Seated shoulder flexion table x 5 R R shoulder ER/IR x 5 isometric   PATIENT EDUCATION: Education details: eval findings, Quick DASH, HEP, POC Person educated: Patient Education method: Explanation, Demonstration, and Handouts Education comprehension: verbalized understanding and returned demonstration  HOME EXERCISE PROGRAM: Access Code: [email or web address] URL: [email or web address] Date: 01/10/2024 Prepared by: Alm Kingdom  Exercises - Seated Scapular Retraction  - 1 x daily - 7 x weekly - 3 sets - 10 reps - 3 sec hold - Scapular Retraction with Resistance  - 1 x daily - 7 x weekly - 3 sets - 10 reps - green band hold - Seated Shoulder Flexion Towel Slide at Table Top  - 1 x daily - 7 x weekly - 2 sets - 10 reps - 5 sec hold - Standing Isometric Shoulder External Rotation with Doorway and Towel Roll  - 1 x daily - 7 x weekly - 2 sets - 10 reps - 5 sec hold - Standing Isometric Shoulder Internal Rotation with Towel Roll at Doorway  - 1 x daily - 7  x weekly - 2 sets - 10 reps - 5 sec hold - Supine Shoulder Flexion Extension Full Range AROM  - 1 x daily - 7 x weekly - 2-3 sets - 10 reps - Shoulder External Rotation with Anchored Resistance (Mirrored)  - 1 x daily - 7 x weekly - 2-3 sets - 10 reps - yellow band hold   11/3  Red TB ER  Red TB IR  Green TB row  5x/wk 1-2x/day, 2x8x3s  ASSESSMENT:  CLINICAL IMPRESSION: Pt was able to complete prescribed exercises with no adverse effect. Today we focused on shoulder strengthening to improve functional ability. HEP was updated R shoulder ER strengthening. Continues to benefit from skilled PT services, will continue per POC.   EVAL: Patient is a 89 y.o. M who was seen today for physical therapy evaluation and treatment for chronic R shoulder pain and discomfort. Also notes chronic  R hip pain when waking up at night but states shoulder is chief compliant today. Physical findings are consistent with referring provider impression as pt demonstrates decrease in R shoulder AROM, R shoulder strength, and decrease in functional ability. Quick DASH demonstrates severe disability in performance of home ADLs and community activities. Pt would benefit from skilled PT services working on improving R shoulder strength and ROM in order to decrease pain and improve function.   OBJECTIVE IMPAIRMENTS: Abnormal gait, decreased activity tolerance, decreased balance, decreased endurance, decreased mobility, difficulty walking, decreased ROM, decreased strength, impaired UE functional use, postural dysfunction, and pain  ACTIVITY LIMITATIONS: carrying, lifting, standing, squatting, stairs, transfers, dressing, reach over head, and hygiene/grooming  PARTICIPATION LIMITATIONS: meal prep, driving, shopping, community activity, and yard work  PERSONAL FACTORS: Time since onset of injury/illness/exacerbation are also affecting patient's functional outcome.   REHAB POTENTIAL: Good  CLINICAL DECISION MAKING:  Evolving/moderate complexity  EVALUATION COMPLEXITY: Moderate  GOALS: Goals reviewed with patient? No  SHORT TERM GOALS: Target date: 01/11/2024   Pt will be compliant and knowledgeable with initial HEP for improved comfort and carryover Baseline: initial HEP given  Goal status: INITIAL  2.  Pt will self report right shoulder pain no greater than 4/10 for improved comfort and functional ability Baseline: 6/10 at worst Goal status: INITIAL   LONG TERM GOALS: Target date: 02/16/2024   Pt will decrease Quick DASH disability score to no greater than 40% as proxy for functional improvement Baseline: 52.3%  Goal status: INITIAL  2.  Pt will self report R shoulder pain no greater than 2/10 for improved comfort and functional ability Baseline: 6/10 at worst Goal status: INITIAL   3.  Pt will improve R shoulder flexion AROM to at least 115 degrees for improved comfort and functional ability Baseline: 70 degrees Goal status: INITIAL  4.  Pt will improve R shoulder MMT to at least 3+/5 for all tested motions for improved comfort and function Baseline:  Goal status: INITIAL   PLAN:  PT FREQUENCY: 1-2x/week  PT DURATION: 8 weeks  PLANNED INTERVENTIONS: 97164- PT Re-evaluation, 97110-Therapeutic exercises, 97530- Therapeutic activity, [id number]- Neuromuscular re-education, 97535- Self Care, [id number]- Manual therapy, [id number]- Gait training, [id number]- Electrical stimulation (unattended), [id number]- Electrical stimulation (manual), 97016- Vasopneumatic device, 20560 (1-2 muscles), 20561 (3+ muscles)- Dry Needling, and Patient/Family education  PLAN FOR NEXT SESSION: assess HEP response, periscapular strengthening, shoulder AAROM and AROM  Date of referral: 12/07/2023 Referring provider: Alvia Corean CROME, FNP  Referring diagnosis?  M25.511,G89.29 (ICD-10-CM) - Chronic right shoulder pain M25.551 (ICD-10-CM) - Right hip pain  Treatment diagnosis? (if different than referring diagnosis)  Chronic  right shoulder pain Muscle weakness (generalized) Abnormal posture Pain in right hip Other abnormalities of gait and mobility  What was this (referring dx) caused by? Ongoing Issue and Arthritis  Lysle of Condition: Chronic (continuous duration > 3 months)   Laterality: Rt  Current Functional Measure Score: DASH 52.3%  Objective measurements identify impairments when they are compared to normal values, the uninvolved extremity, and prior level of function.  [x]  Yes  []  No  Objective assessment of functional ability: Severe functional limitations   Briefly describe symptoms:  Pt presents to PT with reports of chronic R shoulder pain and recent onset of R hip pain. R shoulder is main compliant today overall, hip pain is only present when he wakes up at night. Pain is worst with OH reaching and lifting of R shoulder, limiting overall function. Has some occasional N/T in L hand,  has had carpal tunnel release in past. Notes that he had his L RTC repaired about 25 years ago, was supposed to also have R repaired but did not want to pursue surgery.   How did symptoms start: Chronic pain over last 25+ years with right shoulder  Average pain intensity:  Last 24 hours: 3/10  Past week: 5/10  How often does the pt experience symptoms? Constantly  How much have the symptoms interfered with usual daily activities? Quite a bit  How has condition changed since care began at this facility? NA - initial visit  In general, how is the patients overall health? Good   BACK PAIN (STarT Back Screening Tool) No  Alm JAYSON Kingdom, PT 01/10/2024, 3:31 PM

## 2024-01-17 ENCOUNTER — Ambulatory Visit

## 2024-01-17 DIAGNOSIS — G8929 Other chronic pain: Secondary | ICD-10-CM

## 2024-01-17 DIAGNOSIS — M25511 Pain in right shoulder: Secondary | ICD-10-CM | POA: Diagnosis not present

## 2024-01-17 DIAGNOSIS — M6281 Muscle weakness (generalized): Secondary | ICD-10-CM

## 2024-01-17 DIAGNOSIS — R293 Abnormal posture: Secondary | ICD-10-CM

## 2024-01-17 NOTE — Therapy (Signed)
 OUTPATIENT PHYSICAL THERAPY TREATMENT   Patient Name: Jeremy Boone MRN: [id number] DOB:04-06-1934, 89 y.o., male Today's Date: 01/18/2024  END OF SESSION:  PT End of Session - 01/17/24 1534     Visit Number 5    Number of Visits 17    Date for Recertification  02/16/24    Authorization Type [employer] MCR    PT Start Time 1534    PT Stop Time 1612    PT Time Calculation (min) 38 min    Activity Tolerance Patient tolerated treatment well    Behavior During Therapy WFL for tasks assessed/performed              Past Medical History:  Diagnosis Date   Alcohol consumption of four to five drinks per day    Gout    Hyperlipidemia    Hypertension    OSA on CPAP    Prediabetes    Recurrent spontaneous pneumothorax 12/2022   Initial pneumothorax October 2024 treated with chest tubes; readmitted January 2024 current spontaneous pneumothorax   Past Surgical History:  Procedure Laterality Date   Cardiac Event Monitor  May-June 2017   Sinus rhythm with average heart rate 74.2 - no notable arrhythmias. Minimal PVCs. No PACs.   CARPAL TUNNEL RELEASE Left 2007   CATARACT EXTRACTION, BILATERAL     OTHER SURGICAL HISTORY  2007   Negative biopsy tonsil mass   ROTATOR CUFF REPAIR Left 2000   Patient Active Problem List   Diagnosis Date Noted   Immunization due 12/07/2023   Nasal congestion 09/06/2023   Right hip pain 09/06/2023   Allergic rhinitis 06/03/2023   BMI 24.0-24.9, adult 06/03/2023   Leg swelling 03/27/2023   Left carotid bruit 03/27/2023   Mild aortic stenosis by prior echocardiogram 12/25/2022   Spontaneous pneumothorax 12/24/2022   Demand ischemia (HCC) 12/24/2022   Alcohol use 12/24/2022   Insomnia 06/27/2020   Statin myopathy 11/21/2019   Constipation 12/15/2017   Palpitations 07/31/2015   OSA on CPAP 04/19/2015   Vitamin D  deficiency 09/14/2013   Medication management 09/14/2013   History of colonic polyps 08/02/2013   Gastroesophageal reflux disease  without esophagitis 08/02/2013   Hyperlipidemia, mixed    Essential hypertension    Idiopathic gout    Abnormal glucose     PCP: Alvia Corean CROME, FNP  REFERRING PROVIDER: Alvia Corean CROME, FNP  REFERRING DIAG:  [id number] (ICD-10-CM) - Chronic right shoulder pain M25.551 (ICD-10-CM) - Right hip pain  THERAPY DIAG:  Chronic right shoulder pain  Abnormal posture  Muscle weakness (generalized)  Rationale for Evaluation and Treatment: Rehabilitation  ONSET DATE: Chronic  SUBJECTIVE:  SUBJECTIVE STATEMENT: Pt presents to PT with R shoulder pain 3/10. Has been compliant with HEP.   EVAL: Pt presents to PT with reports of chronic R shoulder pain and recent onset of R hip pain. R shoulder is main compliant today overall, hip pain is only present when he wakes up at night. Pain is worst with OH reaching and lifting of R shoulder, limiting overall function. Has some occasional N/T in L hand, has had carpal tunnel release in past. Notes that he had his L RTC repaired about 25 years ago, was supposed to also have R repaired but did not want to pursue surgery.   Hand dominance: Right  PERTINENT HISTORY: HTN  PAIN:   6/10 Are you having pain?  Yes: NPRS scale: 1/10 Worst: 6/10 Pain location: R shoulder Pain description: sharp, sore, tight Aggravating factors: OH reaching, lifting Relieving factors: rest  PRECAUTIONS: None  RED FLAGS: None   WEIGHT BEARING RESTRICTIONS: No  FALLS:  Has patient fallen in last 6 months? No   LIVING ENVIRONMENT: Lives with: lives alone Lives in: House/apartment Stairs: Yes: Internal: 10 steps; on right going up Has following equipment at home: Single point cane  OCCUPATION: Retired  PLOF: Independent with basic ADLs  PATIENT GOALS: patient  wants to decrease difficulty with hygiene and dressing in regards to R shoulder  NEXT MD VISIT:   OBJECTIVE:  Note: Objective measures were completed at Evaluation unless otherwise noted.  DIAGNOSTIC FINDINGS:  N/A  PATIENT SURVEYS:  Quick Dash: 52.3%  COGNITION: Overall cognitive status: Within functional limits for tasks assessed     SENSATION: WFL  POSTURE: Rounded shoulders, forward head  UPPER EXTREMITY ROM:   Active ROM Right eval Left eval  Shoulder flexion 70 150  Shoulder extension    Shoulder abduction    Shoulder adduction    Shoulder internal rotation    Shoulder external rotation 55 15  Elbow flexion    Elbow extension    Wrist flexion    Wrist extension    Wrist ulnar deviation    Wrist radial deviation    Wrist pronation    Wrist supination    (Blank rows = not tested)  UPPER EXTREMITY MMT:  MMT Right eval Left eval  Shoulder flexion 2+ 3+  Shoulder extension    Shoulder abduction    Shoulder adduction    Shoulder internal rotation 3 4  Shoulder external rotation 3 4  Middle trapezius    Lower trapezius    Elbow flexion    Elbow extension    Wrist flexion    Wrist extension    Wrist ulnar deviation    Wrist radial deviation    Wrist pronation    Wrist supination    Grip strength (lbs)    (Blank rows = not tested)  SHOULDER SPECIAL TESTS: Impingement tests: Painful arc test: positive   JOINT MOBILITY TESTING:  R GH hypmobility, decreased scapular elevation  PALPATION:  TTP to R upper trap, slightly in R infraspinatus and rhomboids   TREATMENT: [facility] Adult PT Treatment:                                                DATE: 01/17/2024 UBE lvl 1.5 x 4 min for functional activity tolerance Pulley's shoulder flexion x 60 Supine shoulder flexion 2x10 R 2# Supine fwd punch 3x10 2# R  shoulder IR/ER 2x10 YTB Standing row 2x10 13# Standing ext 2x10 13# Seated cane ER AAROM 2x10 ea  [facility] Adult PT Treatment:                                                 DATE: 01/10/2024 UBE lvl 1.5 x 4 min for functional activity tolerance Pulley's shoulder flexion 2x60 Supine shoulder flexion 3x10 R Supine fwd punch 3x10 2# Supine horizontal abd x 5 RTB - difficult R shoulder IR/ER 2x10 YTB Standing row 3x10 blue band Standing ext 2x10 GTB Seated cane ER AAROM 2x10 ea  PATIENT EDUCATION: Education details: HEP update Person educated: Patient Education method: Explanation, Demonstration, and Handouts Education comprehension: verbalized understanding and returned demonstration  HOME EXERCISE PROGRAM: Access Code: [email or web address] URL: [email or web address] Date: 01/10/2024 Prepared by: Alm Kingdom  Exercises - Seated Scapular Retraction  - 1 x daily - 7 x weekly - 3 sets - 10 reps - 3 sec hold - Scapular Retraction with Resistance  - 1 x daily - 7 x weekly - 3 sets - 10 reps - green band hold - Seated Shoulder Flexion Towel Slide at Table Top  - 1 x daily - 7 x weekly - 2 sets - 10 reps - 5 sec hold - Standing Isometric Shoulder External Rotation with Doorway and Towel Roll  - 1 x daily - 7 x weekly - 2 sets - 10 reps - 5 sec hold - Standing Isometric Shoulder Internal Rotation with Towel Roll at Doorway  - 1 x daily - 7 x weekly - 2 sets - 10 reps - 5 sec hold - Supine Shoulder Flexion Extension Full Range AROM  - 1 x daily - 7 x weekly - 2-3 sets - 10 reps - Shoulder External Rotation with Anchored Resistance (Mirrored)  - 1 x daily - 7 x weekly - 2-3 sets - 10 reps - yellow band hold   11/3  Red TB ER  Red TB IR  Green TB row  5x/wk 1-2x/day, 2x8x3s  ASSESSMENT:  CLINICAL IMPRESSION: Pt was able to complete prescribed exercises with no adverse effect. Today we focused on shoulder strengthening to improve functional ability. Continues to benefit from skilled PT services, will continue per POC.   EVAL: Patient is a 89 y.o. M who was seen today for physical therapy evaluation and treatment for chronic R  shoulder pain and discomfort. Also notes chronic R hip pain when waking up at night but states shoulder is chief compliant today. Physical findings are consistent with referring provider impression as pt demonstrates decrease in R shoulder AROM, R shoulder strength, and decrease in functional ability. Quick DASH demonstrates severe disability in performance of home ADLs and community activities. Pt would benefit from skilled PT services working on improving R shoulder strength and ROM in order to decrease pain and improve function.   OBJECTIVE IMPAIRMENTS: Abnormal gait, decreased activity tolerance, decreased balance, decreased endurance, decreased mobility, difficulty walking, decreased ROM, decreased strength, impaired UE functional use, postural dysfunction, and pain  ACTIVITY LIMITATIONS: carrying, lifting, standing, squatting, stairs, transfers, dressing, reach over head, and hygiene/grooming  PARTICIPATION LIMITATIONS: meal prep, driving, shopping, community activity, and yard work  PERSONAL FACTORS: Time since onset of injury/illness/exacerbation are also affecting patient's functional outcome.   REHAB POTENTIAL: Good  CLINICAL DECISION MAKING: Evolving/moderate complexity  EVALUATION COMPLEXITY: Moderate  GOALS: Goals reviewed  with patient? No  SHORT TERM GOALS: Target date: 01/11/2024   Pt will be compliant and knowledgeable with initial HEP for improved comfort and carryover Baseline: initial HEP given  Goal status: INITIAL  2.  Pt will self report right shoulder pain no greater than 4/10 for improved comfort and functional ability Baseline: 6/10 at worst Goal status: INITIAL   LONG TERM GOALS: Target date: 02/16/2024   Pt will decrease Quick DASH disability score to no greater than 40% as proxy for functional improvement Baseline: 52.3%  Goal status: INITIAL  2.  Pt will self report R shoulder pain no greater than 2/10 for improved comfort and functional  ability Baseline: 6/10 at worst Goal status: INITIAL   3.  Pt will improve R shoulder flexion AROM to at least 115 degrees for improved comfort and functional ability Baseline: 70 degrees Goal status: INITIAL  4.  Pt will improve R shoulder MMT to at least 3+/5 for all tested motions for improved comfort and function Baseline:  Goal status: INITIAL   PLAN:  PT FREQUENCY: 1-2x/week  PT DURATION: 8 weeks  PLANNED INTERVENTIONS: 97164- PT Re-evaluation, 97110-Therapeutic exercises, 97530- Therapeutic activity, [id number]- Neuromuscular re-education, 97535- Self Care, [id number]- Manual therapy, [id number]- Gait training, [phone]- Electrical stimulation (unattended), [id number]- Electrical stimulation (manual), 97016- Vasopneumatic device, 20560 (1-2 muscles), 20561 (3+ muscles)- Dry Needling, and Patient/Family education  PLAN FOR NEXT SESSION: assess HEP response, periscapular strengthening, shoulder AAROM and AROM  Date of referral: 12/07/2023 Referring provider: Alvia Corean CROME, FNP  Referring diagnosis?  M25.511,G89.29 (ICD-10-CM) - Chronic right shoulder pain M25.551 (ICD-10-CM) - Right hip pain  Treatment diagnosis? (if different than referring diagnosis)  Chronic right shoulder pain Muscle weakness (generalized) Abnormal posture Pain in right hip Other abnormalities of gait and mobility  What was this (referring dx) caused by? Ongoing Issue and Arthritis  Lysle of Condition: Chronic (continuous duration > 3 months)   Laterality: Rt  Current Functional Measure Score: DASH 52.3%  Objective measurements identify impairments when they are compared to normal values, the uninvolved extremity, and prior level of function.  [x]  Yes  []  No  Objective assessment of functional ability: Severe functional limitations   Briefly describe symptoms:  Pt presents to PT with reports of chronic R shoulder pain and recent onset of R hip pain. R shoulder is main compliant today overall, hip pain is  only present when he wakes up at night. Pain is worst with OH reaching and lifting of R shoulder, limiting overall function. Has some occasional N/T in L hand, has had carpal tunnel release in past. Notes that he had his L RTC repaired about 25 years ago, was supposed to also have R repaired but did not want to pursue surgery.   How did symptoms start: Chronic pain over last 25+ years with right shoulder  Average pain intensity:  Last 24 hours: 3/10  Past week: 5/10  How often does the pt experience symptoms? Constantly  How much have the symptoms interfered with usual daily activities? Quite a bit  How has condition changed since care began at this facility? NA - initial visit  In general, how is the patients overall health? Good   BACK PAIN (STarT Back Screening Tool) No  Alm JAYSON Kingdom, PT 01/18/2024, 8:07 AM

## 2024-01-25 ENCOUNTER — Encounter: Admitting: Family Medicine

## 2024-01-25 DIAGNOSIS — Z0000 Encounter for general adult medical examination without abnormal findings: Secondary | ICD-10-CM | POA: Insufficient documentation

## 2024-01-25 NOTE — Progress Notes (Deleted)
 Annual Physical Exam  Subjective:     Patient ID: Jeremy Boone, male    DOB: 11/15/1934, 89 y.o.   MRN: [id number]  No chief complaint on file.   HPI  Discussed the use of AI scribe software for clinical note transcription with the patient, who gave verbal consent to proceed.  History of Present Illness      ROS Per HPI  Most recent fall risk assessment:    06/29/2023    3:33 PM  Fall Risk   Falls in the past year? 0    Most recent depression screenings:    04/06/2023    2:30 PM 03/11/2023    4:02 PM  PHQ 2/9 Scores  PHQ - 2 Score 0 0    Vision:Within last year, Dental: No current dental problems and Receives regular dental care, and PSA: Declines PSA testing at this time   Patient Care Team: Alvia Corean CROME, FNP as PCP - General (Family Medicine) Anner Alm ORN, MD as PCP - Cardiology (Cardiology) Carrie Dunnings, MD as Consulting Physician (Ophthalmology) [place], Lynwood SQUIBB, MD (Inactive) as Consulting Physician (Orthopedic Surgery) Debrah Lamar BIRCH, MD (Inactive) as Consulting Physician (Gastroenterology) Delon George as Physician Assistant (Sleep Medicine)   Outpatient Medications Prior to Visit  Medication Sig   albuterol  (VENTOLIN  HFA) 108 (90 Base) MCG/ACT inhaler Inhale 2 puffs into the lungs every 6 (six) hours as needed for wheezing or shortness of breath.   allopurinol  (ZYLOPRIM ) 300 MG tablet Take  1 tablet  Daily for For Gout Prevention                                                                /                                                                   TAKE                                         BY                                                 MOUTH   amLODipine  (NORVASC ) 2.5 MG tablet Take 1 tablet (2.5 mg total) by mouth daily.   aspirin  EC 81 MG tablet Take 81 mg by mouth daily. Patient was told to start taking per Dr, but Patient hasn't started 03/16/2023.   Cholecalciferol (VITAMIN D  PO) Take 5,000 Int'l Units by mouth  2 (two) times daily.   ezetimibe  (ZETIA ) 10 MG tablet TAKE 1 TABLET BY MOUTH  DAILY FOR CHOLESTEROL   famotidine  (PEPCID ) 20 MG tablet TAKE 1 TABLET BY MOUTH TWICE  DAILY BEFORE BREAKFAST AND  SUPPER TO PREVENT HEARTBURN AND  INDIGESTION   fluticasone  (FLONASE ) 50 MCG/ACT nasal spray SPRAY 2 SPRAYS INTO EACH NOSTRIL  EVERY DAY   furosemide  (LASIX ) 40 MG tablet Take 1 tablet Daily as needed for  Fluid Retention / Ankle Swelling   gabapentin  (NEURONTIN ) 300 MG capsule TAKE 1 TO 2 CAPSULES BY MOUTH 1  TO 2 HOURS BEFORE BEDTIME AS  NEEDED FOR SLEEP   gemfibrozil  (LOPID ) 600 MG tablet TAKE 1 TABLET BY MOUTH TWICE  DAILY WITH MEALS FOR CHOLESTEROL   oxybutynin  (DITROPAN -XL) 10 MG 24 hr tablet Take  1 tablet Daily for Bladder                                                                 /                                                     TAKE                       BY                                                 MOUTH (Patient taking differently: Take 10 mg by mouth daily as needed (retention). Take  1 tablet Daily for Bladder                                                                 /                                                     TAKE                       BY                                                 MOUTH)   thiamine  (VITAMIN B-1) 100 MG tablet Take 1 tablet (100 mg total) by mouth daily.   No facility-administered medications prior to visit.       Objective:    There were no vitals taken for this visit.   Physical Exam Vitals and nursing note reviewed.  Constitutional:      General: He is not in acute distress.    Appearance: Normal appearance.  HENT:     Head: Normocephalic and atraumatic.     Right Ear: External ear normal.     Left Ear: External ear normal.     Nose: Nose normal.     Mouth/Throat:     Mouth: Mucous  membranes are moist.     Pharynx: Oropharynx is clear.  Eyes:     Extraocular Movements: Extraocular movements intact.  Cardiovascular:     Rate  and Rhythm: Normal rate and regular rhythm.     Pulses: Normal pulses.     Heart sounds: Normal heart sounds.  Pulmonary:     Effort: Pulmonary effort is normal. No respiratory distress.     Breath sounds: Normal breath sounds. No wheezing, rhonchi or rales.  Musculoskeletal:        General: Normal range of motion.     Cervical back: Normal range of motion.     Right lower leg: No edema.     Left lower leg: No edema.  Lymphadenopathy:     Cervical: No cervical adenopathy.  Skin:    General: Skin is warm and dry.  Neurological:     General: No focal deficit present.     Mental Status: He is alert and oriented to person, place, and time.  Psychiatric:        Mood and Affect: Mood normal.        Behavior: Behavior normal.     No results found for any visits on 01/25/24.  BP Readings from Last 3 Encounters:  12/07/23 (!) 142/82  09/06/23 (!) 140/82  06/29/23 136/84   Wt Readings from Last 3 Encounters:  12/07/23 175 lb 12.8 oz (79.7 kg)  09/06/23 178 lb 12.8 oz (81.1 kg)  06/29/23 176 lb (79.8 kg)      Last CBC Lab Results  Component Value Date   WBC 4.2 12/07/2023   HGB 14.2 12/07/2023   HCT 43.0 12/07/2023   MCV 92.4 12/07/2023   MCH 30.7 03/20/2023   RDW 14.5 12/07/2023   PLT 238.0 12/07/2023   Last metabolic panel Lab Results  Component Value Date   GLUCOSE 115 (H) 12/07/2023   NA 138 12/07/2023   K 4.0 12/07/2023   CL 102 12/07/2023   CO2 29 12/07/2023   BUN 12 12/07/2023   CREATININE 0.89 12/07/2023   GFR 76.94 12/07/2023   CALCIUM  9.9 12/07/2023   PROT 8.2 12/07/2023   ALBUMIN 4.5 12/07/2023   BILITOT 0.4 12/07/2023   ALKPHOS 66 12/07/2023   AST 18 12/07/2023   ALT 6 12/07/2023   ANIONGAP 8 03/20/2023   Last lipids Lab Results  Component Value Date   CHOL 188 09/06/2023   HDL 86.50 09/06/2023   LDLCALC 90 09/06/2023   TRIG 62.0 09/06/2023   CHOLHDL 2 09/06/2023   Last hemoglobin A1c Lab Results  Component Value Date   HGBA1C 6.2  09/06/2023   Last thyroid  functions Lab Results  Component Value Date   TSH 2.04 12/07/2023   Last vitamin D  Lab Results  Component Value Date   VD25OH 73.17 12/07/2023   Last vitamin B12 and Folate Lab Results  Component Value Date   VITAMINB12 744 12/07/2023         Assessment & Plan:   Assessment and Plan Assessment & Plan      Health Maintenance  Topic Date Due   Zoster (Shingles) Vaccine (1 of 2) 03/19/1953   COVID-19 Vaccine (5 - 2025-26 season) 11/01/2023   Medicare Annual Wellness Visit  03/10/2024   DTaP/Tdap/Td vaccine (3 - Td or Tdap) 10/24/2024   Pneumococcal Vaccine for age over 73  Completed   Flu Shot  Completed   Meningitis B Vaccine  Aged Out     Discussed health benefits of physical activity, and encouraged him  to engage in regular exercise appropriate for his age and condition.  No orders of the defined types were placed in this encounter.    No orders of the defined types were placed in this encounter.   No follow-ups on file.  Corean LITTIE Ku, FNP

## 2024-01-25 NOTE — Patient Instructions (Incomplete)
 We have completed your physical today.  I have ordered labs for you to do when you have time later.   Please call the breast center and make an appointment for a screening mammogram.   Continue current medication regimen.   Follow up with me in year for your next physical, sooner if needed.

## 2024-02-02 ENCOUNTER — Ambulatory Visit: Admitting: Physical Therapy

## 2024-02-02 ENCOUNTER — Encounter: Payer: Self-pay | Admitting: Physical Therapy

## 2024-02-02 DIAGNOSIS — R293 Abnormal posture: Secondary | ICD-10-CM | POA: Diagnosis present

## 2024-02-02 DIAGNOSIS — G8929 Other chronic pain: Secondary | ICD-10-CM | POA: Diagnosis present

## 2024-02-02 DIAGNOSIS — M25511 Pain in right shoulder: Secondary | ICD-10-CM | POA: Insufficient documentation

## 2024-02-02 DIAGNOSIS — M6281 Muscle weakness (generalized): Secondary | ICD-10-CM | POA: Insufficient documentation

## 2024-02-02 NOTE — Therapy (Signed)
 OUTPATIENT PHYSICAL THERAPY TREATMENT   Patient Name: Jeremy Boone MRN: [id number] DOB:12-Apr-1934, 89 y.o., male Today's Date: 02/02/2024  END OF SESSION:  PT End of Session - 02/02/24 1405     Visit Number 6    Number of Visits 17    Date for Recertification  02/16/24    Authorization Type [employer] MCR    PT Start Time 0203    PT Stop Time 0245    PT Time Calculation (min) 42 min              Past Medical History:  Diagnosis Date   Alcohol consumption of four to five drinks per day    Gout    Hyperlipidemia    Hypertension    OSA on CPAP    Prediabetes    Recurrent spontaneous pneumothorax 12/2022   Initial pneumothorax October 2024 treated with chest tubes; readmitted January 2024 current spontaneous pneumothorax   Past Surgical History:  Procedure Laterality Date   Cardiac Event Monitor  May-June 2017   Sinus rhythm with average heart rate 74.2 - no notable arrhythmias. Minimal PVCs. No PACs.   CARPAL TUNNEL RELEASE Left 2007   CATARACT EXTRACTION, BILATERAL     OTHER SURGICAL HISTORY  2007   Negative biopsy tonsil mass   ROTATOR CUFF REPAIR Left 2000   Patient Active Problem List   Diagnosis Date Noted   Adult wellness visit 01/25/2024   Immunization due 12/07/2023   Nasal congestion 09/06/2023   Right hip pain 09/06/2023   Allergic rhinitis 06/03/2023   BMI 24.0-24.9, adult 06/03/2023   Leg swelling 03/27/2023   Left carotid bruit 03/27/2023   Mild aortic stenosis by prior echocardiogram 12/25/2022   Spontaneous pneumothorax 12/24/2022   Demand ischemia (HCC) 12/24/2022   Alcohol use 12/24/2022   Insomnia 06/27/2020   Statin myopathy 11/21/2019   Constipation 12/15/2017   Palpitations 07/31/2015   OSA on CPAP 04/19/2015   Vitamin D  deficiency 09/14/2013   Medication management 09/14/2013   History of colonic polyps 08/02/2013   Gastroesophageal reflux disease without esophagitis 08/02/2013   Hyperlipidemia, mixed    Essential hypertension     Idiopathic gout    Abnormal glucose     PCP: Alvia Corean CROME, FNP  REFERRING PROVIDER: Alvia Corean CROME, FNP  REFERRING DIAG:  [phone] (ICD-10-CM) - Chronic right shoulder pain M25.551 (ICD-10-CM) - Right hip pain  THERAPY DIAG:  Chronic right shoulder pain  Abnormal posture  Rationale for Evaluation and Treatment: Rehabilitation  ONSET DATE: Chronic  SUBJECTIVE:  SUBJECTIVE STATEMENT:  Has been compliant with HEP. Only hurts when I use it. I see improvement. I can raise it a little higher.   EVAL: Pt presents to PT with reports of chronic R shoulder pain and recent onset of R hip pain. R shoulder is main compliant today overall, hip pain is only present when he wakes up at night. Pain is worst with OH reaching and lifting of R shoulder, limiting overall function. Has some occasional N/T in L hand, has had carpal tunnel release in past. Notes that he had his L RTC repaired about 25 years ago, was supposed to also have R repaired but did not want to pursue surgery.   Hand dominance: Right  PERTINENT HISTORY: HTN  PAIN:   6/10 Are you having pain?  Yes: NPRS scale: 0/10 Worst: 6/10 Pain location: R shoulder Pain description: sharp, sore, tight Aggravating factors: OH reaching, lifting Relieving factors: rest  PRECAUTIONS: None  RED FLAGS: None   WEIGHT BEARING RESTRICTIONS: No  FALLS:  Has patient fallen in last 6 months? No   LIVING ENVIRONMENT: Lives with: lives alone Lives in: House/apartment Stairs: Yes: Internal: 10 steps; on right going up Has following equipment at home: Single point cane  OCCUPATION: Retired  PLOF: Independent with basic ADLs  PATIENT GOALS: patient wants to decrease difficulty with hygiene and dressing in regards to R shoulder  NEXT  MD VISIT:   OBJECTIVE:  Note: Objective measures were completed at Evaluation unless otherwise noted.  DIAGNOSTIC FINDINGS:  N/A  PATIENT SURVEYS:  Quick Dash: 52.3%  COGNITION: Overall cognitive status: Within functional limits for tasks assessed     SENSATION: WFL  POSTURE: Rounded shoulders, forward head  UPPER EXTREMITY ROM:   Active ROM Right eval Left eval Right 02/02/24  Shoulder flexion 70 150 90A 120AA  Shoulder extension     Shoulder abduction     Shoulder adduction     Shoulder internal rotation     Shoulder external rotation 55 15   Elbow flexion     Elbow extension     Wrist flexion     Wrist extension     Wrist ulnar deviation     Wrist radial deviation     Wrist pronation     Wrist supination     (Blank rows = not tested)  UPPER EXTREMITY MMT:  MMT Right eval Left eval  Shoulder flexion 2+ 3+  Shoulder extension    Shoulder abduction    Shoulder adduction    Shoulder internal rotation 3 4  Shoulder external rotation 3 4  Middle trapezius    Lower trapezius    Elbow flexion    Elbow extension    Wrist flexion    Wrist extension    Wrist ulnar deviation    Wrist radial deviation    Wrist pronation    Wrist supination    Grip strength (lbs)    (Blank rows = not tested)  SHOULDER SPECIAL TESTS: Impingement tests: Painful arc test: positive   JOINT MOBILITY TESTING:  R GH hypmobility, decreased scapular elevation  PALPATION:  TTP to R upper trap, slightly in R infraspinatus and rhomboids   TREATMENT: [facility] Adult PT Treatment:                                                DATE: 02/02/24 Therapeutic Exercise:  UBE level 2.0 x 3 minutes each way for functional activity tolerance  Shoulder pulleys  Row blue x 20  EXT Green x 20 ER Red x 10 IR red x 12 Supine chest press with dowel  Pullovers with dowel  Supine AAROM ER /IR  Supine fwd punch 1x10 2#, 1 x 10 3#  Supine shoulder flexion long lever 2#  Supine red band horiz  abdct x 5 -audible joint rubbing Red band hold at 90 with left arm diagonal pull down x 10     [facility] Adult PT Treatment:                                                DATE: 01/17/2024 UBE lvl 1.5 x 4 min for functional activity tolerance Pulley's shoulder flexion x 60 Supine shoulder flexion 2x10 R 2# Supine fwd punch 3x10 2# R shoulder IR/ER 2x10 YTB Standing row 2x10 13# Standing ext 2x10 13# Seated cane ER AAROM 2x10 ea  [facility] Adult PT Treatment:                                                DATE: 01/10/2024 UBE lvl 1.5 x 4 min for functional activity tolerance Pulley's shoulder flexion 2x60 Supine shoulder flexion 3x10 R Supine fwd punch 3x10 2# Supine horizontal abd x 5 RTB - difficult R shoulder IR/ER 2x10 YTB Standing row 3x10 blue band Standing ext 2x10 GTB Seated cane ER AAROM 2x10 ea  PATIENT EDUCATION: Education details: HEP update Person educated: Patient Education method: Explanation, Demonstration, and Handouts Education comprehension: verbalized understanding and returned demonstration  HOME EXERCISE PROGRAM: Access Code: [email or web address] URL: [email or web address] Date: 01/10/2024 Prepared by: Alm Kingdom  Exercises - Seated Scapular Retraction  - 1 x daily - 7 x weekly - 3 sets - 10 reps - 3 sec hold - Scapular Retraction with Resistance  - 1 x daily - 7 x weekly - 3 sets - 10 reps - green band hold - Seated Shoulder Flexion Towel Slide at Table Top  - 1 x daily - 7 x weekly - 2 sets - 10 reps - 5 sec hold - Standing Isometric Shoulder External Rotation with Doorway and Towel Roll  - 1 x daily - 7 x weekly - 2 sets - 10 reps - 5 sec hold - Standing Isometric Shoulder Internal Rotation with Towel Roll at Doorway  - 1 x daily - 7 x weekly - 2 sets - 10 reps - 5 sec hold - Supine Shoulder Flexion Extension Full Range AROM  - 1 x daily - 7 x weekly - 2-3 sets - 10 reps - Shoulder External Rotation with Anchored Resistance (Mirrored)  - 1 x daily - 7  x weekly - 2-3 sets - 10 reps - yellow band hold   11/3  Red TB ER  Red TB IR  Green TB row  5x/wk 1-2x/day, 2x8x3s  ASSESSMENT:  CLINICAL IMPRESSION: Pt was able to complete prescribed exercises with no adverse effect. Today we focused on shoulder strengthening to improve functional ability. Reports noted improvement in ROM, allowing him to perform self care/ grooming. Also, notes reduced pain and popping. STGs MET.  Demonstrates improved AROM today in clinic. Continues to benefit from skilled  PT services, will continue per POC.   EVAL: Patient is a 89 y.o. M who was seen today for physical therapy evaluation and treatment for chronic R shoulder pain and discomfort. Also notes chronic R hip pain when waking up at night but states shoulder is chief compliant today. Physical findings are consistent with referring provider impression as pt demonstrates decrease in R shoulder AROM, R shoulder strength, and decrease in functional ability. Quick DASH demonstrates severe disability in performance of home ADLs and community activities. Pt would benefit from skilled PT services working on improving R shoulder strength and ROM in order to decrease pain and improve function.   OBJECTIVE IMPAIRMENTS: Abnormal gait, decreased activity tolerance, decreased balance, decreased endurance, decreased mobility, difficulty walking, decreased ROM, decreased strength, impaired UE functional use, postural dysfunction, and pain  ACTIVITY LIMITATIONS: carrying, lifting, standing, squatting, stairs, transfers, dressing, reach over head, and hygiene/grooming  PARTICIPATION LIMITATIONS: meal prep, driving, shopping, community activity, and yard work  PERSONAL FACTORS: Time since onset of injury/illness/exacerbation are also affecting patient's functional outcome.   REHAB POTENTIAL: Good  CLINICAL DECISION MAKING: Evolving/moderate complexity  EVALUATION COMPLEXITY: Moderate  GOALS: Goals reviewed with patient?  No  SHORT TERM GOALS: Target date: 01/11/2024   Pt will be compliant and knowledgeable with initial HEP for improved comfort and carryover Baseline: initial HEP given  Goal status: MET   2.  Pt will self report right shoulder pain no greater than 4/10 for improved comfort and functional ability Baseline: 6/10 at worst Goal status: MET    LONG TERM GOALS: Target date: 02/16/2024   Pt will decrease Quick DASH disability score to no greater than 40% as proxy for functional improvement Baseline: 52.3%  Goal status: INITIAL  2.  Pt will self report R shoulder pain no greater than 2/10 for improved comfort and functional ability Baseline: 6/10 at worst Goal status: INITIAL   3.  Pt will improve R shoulder flexion AROM to at least 115 degrees for improved comfort and functional ability Baseline: 70 degrees Goal status: INITIAL  4.  Pt will improve R shoulder MMT to at least 3+/5 for all tested motions for improved comfort and function Baseline:  Goal status: INITIAL   PLAN:  PT FREQUENCY: 1-2x/week  PT DURATION: 8 weeks  PLANNED INTERVENTIONS: 97164- PT Re-evaluation, 97110-Therapeutic exercises, 97530- Therapeutic activity, [id number]- Neuromuscular re-education, 97535- Self Care, [id number]- Manual therapy, [id number]- Gait training, [phone]- Electrical stimulation (unattended), [id number]- Electrical stimulation (manual), 97016- Vasopneumatic device, 20560 (1-2 muscles), 20561 (3+ muscles)- Dry Needling, and Patient/Family education  PLAN FOR NEXT SESSION: assess HEP response, periscapular strengthening, shoulder AAROM and AROM  Date of referral: 12/07/2023 Referring provider: Alvia Corean CROME, FNP  Referring diagnosis?  M25.511,G89.29 (ICD-10-CM) - Chronic right shoulder pain M25.551 (ICD-10-CM) - Right hip pain  Treatment diagnosis? (if different than referring diagnosis)  Chronic right shoulder pain Muscle weakness (generalized) Abnormal posture Pain in right hip Other abnormalities  of gait and mobility  What was this (referring dx) caused by? Ongoing Issue and Arthritis  Lysle of Condition: Chronic (continuous duration > 3 months)   Laterality: Rt  Current Functional Measure Score: DASH 52.3%  Objective measurements identify impairments when they are compared to normal values, the uninvolved extremity, and prior level of function.  [x]  Yes  []  No  Objective assessment of functional ability: Severe functional limitations   Briefly describe symptoms:  Pt presents to PT with reports of chronic R shoulder pain and recent onset of R hip pain. R shoulder  is main compliant today overall, hip pain is only present when he wakes up at night. Pain is worst with OH reaching and lifting of R shoulder, limiting overall function. Has some occasional N/T in L hand, has had carpal tunnel release in past. Notes that he had his L RTC repaired about 25 years ago, was supposed to also have R repaired but did not want to pursue surgery.   How did symptoms start: Chronic pain over last 25+ years with right shoulder  Average pain intensity:  Last 24 hours: 3/10  Past week: 5/10  How often does the pt experience symptoms? Constantly  How much have the symptoms interfered with usual daily activities? Quite a bit  How has condition changed since care began at this facility? NA - initial visit  In general, how is the patients overall health? Good   BACK PAIN (STarT Back Screening Tool) No  Harlene Persons, PTA 02/02/24 2:47 PM Phone: [phone] Fax: [phone]

## 2024-02-09 ENCOUNTER — Ambulatory Visit

## 2024-02-14 ENCOUNTER — Other Ambulatory Visit: Payer: Self-pay | Admitting: Family Medicine

## 2024-02-14 DIAGNOSIS — I10 Essential (primary) hypertension: Secondary | ICD-10-CM

## 2024-02-16 ENCOUNTER — Ambulatory Visit

## 2024-02-28 ENCOUNTER — Ambulatory Visit: Admitting: Physical Therapy

## 2024-02-28 ENCOUNTER — Encounter: Payer: Self-pay | Admitting: Physical Therapy

## 2024-02-28 DIAGNOSIS — G8929 Other chronic pain: Secondary | ICD-10-CM

## 2024-02-28 DIAGNOSIS — R293 Abnormal posture: Secondary | ICD-10-CM

## 2024-02-28 DIAGNOSIS — M25511 Pain in right shoulder: Secondary | ICD-10-CM | POA: Diagnosis not present

## 2024-02-28 DIAGNOSIS — M6281 Muscle weakness (generalized): Secondary | ICD-10-CM

## 2024-02-28 NOTE — Addendum Note (Signed)
 Addended by: BETHEL JONEEN CROME on: 02/28/2024 03:38 PM   Modules accepted: Orders

## 2024-02-28 NOTE — Therapy (Addendum)
 " OUTPATIENT PHYSICAL THERAPY TREATMENT   Patient Name: Jeremy Boone MRN: [id number] DOB:Mar 29, 1934, 89 y.o., male Today's Date: 02/28/2024  END OF SESSION:  PT End of Session - 02/28/24 1449     Visit Number 7    Number of Visits 17    Date for Recertification  04/10/24    Authorization Type [employer] MCR- needs auth    Authorization Time Period 6 PT visits from 12/21/23-02/15/24  ; additional visits requested    Authorization - Visit Number 1    PT Start Time 0248    PT Stop Time 0330    PT Time Calculation (min) 42 min              Past Medical History:  Diagnosis Date   Alcohol consumption of four to five drinks per day    Gout    Hyperlipidemia    Hypertension    OSA on CPAP    Prediabetes    Recurrent spontaneous pneumothorax 12/2022   Initial pneumothorax October 2024 treated with chest tubes; readmitted January 2024 current spontaneous pneumothorax   Past Surgical History:  Procedure Laterality Date   Cardiac Event Monitor  May-June 2017   Sinus rhythm with average heart rate 74.2 - no notable arrhythmias. Minimal PVCs. No PACs.   CARPAL TUNNEL RELEASE Left 2007   CATARACT EXTRACTION, BILATERAL     OTHER SURGICAL HISTORY  2007   Negative biopsy tonsil mass   ROTATOR CUFF REPAIR Left 2000   Patient Active Problem List   Diagnosis Date Noted   Adult wellness visit 01/25/2024   Immunization due 12/07/2023   Nasal congestion 09/06/2023   Right hip pain 09/06/2023   Allergic rhinitis 06/03/2023   BMI 24.0-24.9, adult 06/03/2023   Leg swelling 03/27/2023   Left carotid bruit 03/27/2023   Mild aortic stenosis by prior echocardiogram 12/25/2022   Spontaneous pneumothorax 12/24/2022   Demand ischemia (HCC) 12/24/2022   Alcohol use 12/24/2022   Insomnia 06/27/2020   Statin myopathy 11/21/2019   Constipation 12/15/2017   Palpitations 07/31/2015   OSA on CPAP 04/19/2015   Vitamin D  deficiency 09/14/2013   Medication management 09/14/2013   History of  colonic polyps 08/02/2013   Gastroesophageal reflux disease without esophagitis 08/02/2013   Hyperlipidemia, mixed    Essential hypertension    Idiopathic gout    Abnormal glucose     PCP: Alvia Corean CROME, FNP  REFERRING PROVIDER: Alvia Corean CROME, FNP  REFERRING DIAG:  [phone] (ICD-10-CM) - Chronic right shoulder pain M25.551 (ICD-10-CM) - Right hip pain  THERAPY DIAG:  Chronic right shoulder pain  Abnormal posture  Muscle weakness (generalized)  Rationale for Evaluation and Treatment: Rehabilitation  ONSET DATE: Chronic  SUBJECTIVE:  SUBJECTIVE STATEMENT:  Has been compliant with some HEP. Still cannot raise arm very high.   EVAL: Pt presents to PT with reports of chronic R shoulder pain and recent onset of R hip pain. R shoulder is main compliant today overall, hip pain is only present when he wakes up at night. Pain is worst with OH reaching and lifting of R shoulder, limiting overall function. Has some occasional N/T in L hand, has had carpal tunnel release in past. Notes that he had his L RTC repaired about 25 years ago, was supposed to also have R repaired but did not want to pursue surgery.   Hand dominance: Right  PERTINENT HISTORY: HTN  PAIN:   6/10 Are you having pain?  Yes: NPRS scale: 0/10 Worst: 4-5/10 Pain location: R shoulder Pain description: sharp, sore, tight Aggravating factors: OH reaching, lifting Relieving factors: rest  PRECAUTIONS: None  RED FLAGS: None   WEIGHT BEARING RESTRICTIONS: No  FALLS:  Has patient fallen in last 6 months? No   LIVING ENVIRONMENT: Lives with: lives alone Lives in: House/apartment Stairs: Yes: Internal: 10 steps; on right going up Has following equipment at home: Single point  cane  OCCUPATION: Retired  PLOF: Independent with basic ADLs  PATIENT GOALS: patient wants to decrease difficulty with hygiene and dressing in regards to R shoulder  NEXT MD VISIT:   OBJECTIVE:  Note: Objective measures were completed at Evaluation unless otherwise noted.  DIAGNOSTIC FINDINGS:  N/A  PATIENT SURVEYS:  Quick Dash: 52.3% 02/28/24: 45.5%   COGNITION: Overall cognitive status: Within functional limits for tasks assessed     SENSATION: WFL  POSTURE: Rounded shoulders, forward head  UPPER EXTREMITY ROM:   Active ROM Right eval Left eval Right 02/02/24 Right 02/01/24  Shoulder flexion 70 150 90A 120AA 90 standing 120 supine   Shoulder extension      Shoulder abduction    80  Shoulder adduction      Shoulder internal rotation      Shoulder external rotation 55 15    Elbow flexion      Elbow extension      Wrist flexion      Wrist extension      Wrist ulnar deviation      Wrist radial deviation      Wrist pronation      Wrist supination      (Blank rows = not tested)  UPPER EXTREMITY MMT:  MMT Right eval Left eval Right 02/28/24  Shoulder flexion 2+ 3+ 2+  Shoulder extension     Shoulder abduction     Shoulder adduction     Shoulder internal rotation 3 4 3   Shoulder external rotation 3 4 3   Middle trapezius     Lower trapezius     Elbow flexion     Elbow extension     Wrist flexion     Wrist extension     Wrist ulnar deviation     Wrist radial deviation     Wrist pronation     Wrist supination     Grip strength (lbs)     (Blank rows = not tested)  SHOULDER SPECIAL TESTS: Impingement tests: Painful arc test: positive   JOINT MOBILITY TESTING:  R GH hypmobility, decreased scapular elevation  PALPATION:  TTP to R upper trap, slightly in R infraspinatus and rhomboids   TREATMENT: [facility] Adult PT Treatment:  DATE: 02/28/24:  Therapeutic Exercise: Supine shoulder flexion long lever  red band 2 x 8 Supine punch 3# 8 x 2  Pullovers 3# on dowel 8  x 2  Supine red band ER bilat 8 x 2  UBE L2 x 3 minutes each way  Pulleys OH  Row blue x 20 10 x 2  EXT Green x 20 10 x 2  IR GTB  10 x 2 standing right      [facility] Adult PT Treatment:                                                DATE: 02/02/24 Therapeutic Exercise: UBE level 2.0 x 3 minutes each way for functional activity tolerance  Shoulder pulleys  Row blue x 20  EXT Green x 20 ER Red x 10 IR red x 12 Supine chest press with dowel  Pullovers with dowel  Supine AAROM ER /IR  Supine fwd punch 1x10 2#, 1 x 10 3#  Supine shoulder flexion long lever 2#  Supine red band horiz abdct x 5 -audible joint rubbing Red band hold at 90 with left arm diagonal pull down x 10     [facility] Adult PT Treatment:                                                DATE: 01/17/2024 UBE lvl 1.5 x 4 min for functional activity tolerance Pulley's shoulder flexion x 60 Supine shoulder flexion 2x10 R 2# Supine fwd punch 3x10 2# R shoulder IR/ER 2x10 YTB Standing row 2x10 13# Standing ext 2x10 13# Seated cane ER AAROM 2x10 ea  [facility] Adult PT Treatment:                                                DATE: 01/10/2024 UBE lvl 1.5 x 4 min for functional activity tolerance Pulley's shoulder flexion 2x60 Supine shoulder flexion 3x10 R Supine fwd punch 3x10 2# Supine horizontal abd x 5 RTB - difficult R shoulder IR/ER 2x10 YTB Standing row 3x10 blue band Standing ext 2x10 GTB Seated cane ER AAROM 2x10 ea  PATIENT EDUCATION: Education details: HEP update Person educated: Patient Education method: Explanation, Demonstration, and Handouts Education comprehension: verbalized understanding and returned demonstration  HOME EXERCISE PROGRAM: Access Code: [email or web address] URL: [email or web address] Date: 01/10/2024 Prepared by: Alm Kingdom  Exercises - Seated Scapular Retraction  - 1 x daily - 7 x weekly - 3 sets - 10 reps - 3 sec  hold - Scapular Retraction with Resistance  - 1 x daily - 7 x weekly - 3 sets - 10 reps - green band hold - Seated Shoulder Flexion Towel Slide at Table Top  - 1 x daily - 7 x weekly - 2 sets - 10 reps - 5 sec hold - Standing Isometric Shoulder External Rotation with Doorway and Towel Roll  - 1 x daily - 7 x weekly - 2 sets - 10 reps - 5 sec hold - Standing Isometric Shoulder Internal Rotation with Towel Roll at Doorway  - 1 x daily - 7 x weekly - 2  sets - 10 reps - 5 sec hold - Supine Shoulder Flexion Extension Full Range AROM  - 1 x daily - 7 x weekly - 2-3 sets - 10 reps - Shoulder External Rotation with Anchored Resistance (Mirrored)  - 1 x daily - 7 x weekly - 2-3 sets - 10 reps - yellow band hold   11/3  Red TB ER  Red TB IR  Green TB row  5x/wk 1-2x/day, 2x8x3s  ASSESSMENT:  CLINICAL IMPRESSION: Pt arrives after 3 week absence due to lack of appointment availability. He reports that he has continued some of his HEP. Pt was able to complete prescribed exercises with no adverse effect. Today we focused on shoulder strengthening to improve functional ability. Reports he still has difficulty in gravity resisted positions, unable to reach above 80-90 degrees. Added resistance in supine which was well tolerated. Continued RTC and scapular strengthening and reviewed HEP. Pt progress limited due to lack of appointment availability and chronicity of condition. He has made improvements in ROM, quick Dash and use of right arm although still very limited with ADLs. He would benefit continuation of skilled PT to address listed deficits. Pt also verbalizes feeling unbalanced and would benefit from balance assessment and balance HEP therefore will extend POC an additional 2 x per week for 6 weeks.    EVAL: Patient is a 89 y.o. M who was seen today for physical therapy evaluation and treatment for chronic R shoulder pain and discomfort. Also notes chronic R hip pain when waking up at night but states  shoulder is chief compliant today. Physical findings are consistent with referring provider impression as pt demonstrates decrease in R shoulder AROM, R shoulder strength, and decrease in functional ability. Quick DASH demonstrates severe disability in performance of home ADLs and community activities. Pt would benefit from skilled PT services working on improving R shoulder strength and ROM in order to decrease pain and improve function.   OBJECTIVE IMPAIRMENTS: Abnormal gait, decreased activity tolerance, decreased balance, decreased endurance, decreased mobility, difficulty walking, decreased ROM, decreased strength, impaired UE functional use, postural dysfunction, and pain  ACTIVITY LIMITATIONS: carrying, lifting, standing, squatting, stairs, transfers, dressing, reach over head, and hygiene/grooming  PARTICIPATION LIMITATIONS: meal prep, driving, shopping, community activity, and yard work  PERSONAL FACTORS: Time since onset of injury/illness/exacerbation are also affecting patient's functional outcome.   REHAB POTENTIAL: Good  CLINICAL DECISION MAKING: Evolving/moderate complexity  EVALUATION COMPLEXITY: Moderate  GOALS: Goals reviewed with patient? No  SHORT TERM GOALS: Target date: 01/11/2024   Pt will be compliant and knowledgeable with initial HEP for improved comfort and carryover Baseline: initial HEP given  Goal status: MET   2.  Pt will self report right shoulder pain no greater than 4/10 for improved comfort and functional ability Baseline: 6/10 at worst Goal status: MET    LONG TERM GOALS: Target date: 04/10/2024    Pt will decrease Quick DASH disability score to no greater than 40% as proxy for functional improvement Baseline: 52.3%  02/25/24: 45.5%  Goal status: ONGOING  2.  Pt will self report R shoulder pain no greater than 2/10 for improved comfort and functional ability Baseline: 6/10 at worst 02/28/24: 4-5/10  Goal status: ONGOING   3.  Pt will improve  R shoulder flexion AROM to at least 115 degrees for improved comfort and functional ability Baseline: 70 degrees 02/28/24: 85-90 degrees Goal status: ONGOING  4.  Pt will improve R shoulder MMT to at least 3+/5 for all tested motions  for improved comfort and function Baseline:  02/28/24: 2+/5-3-/5  Goal status: ONGOING   PLAN:  PT FREQUENCY: 1-2x/week  PT DURATION:  6 weeks   PLANNED INTERVENTIONS: 97164- PT Re-evaluation, 97110-Therapeutic exercises, 97530- Therapeutic activity, 97112- Neuromuscular re-education, 97535- Self Care, 02859- Manual therapy, [id number]- Gait training, [phone]- Electrical stimulation (unattended), [id number]- Electrical stimulation (manual), 97016- Vasopneumatic device, 20560 (1-2 muscles), 20561 (3+ muscles)- Dry Needling, and Patient/Family education  PLAN FOR NEXT SESSION: assess HEP response, periscapular strengthening, shoulder AAROM and AROM, BERG?  Date of referral: 12/07/2023 Referring provider: Alvia Corean CROME, FNP  Referring diagnosis?  M25.511,G89.29 (ICD-10-CM) - Chronic right shoulder pain M25.551 (ICD-10-CM) - Right hip pain  Treatment diagnosis? (if different than referring diagnosis)  Chronic right shoulder pain Muscle weakness (generalized) Abnormal posture Pain in right hip Other abnormalities of gait and mobility  What was this (referring dx) caused by? Ongoing Issue and Arthritis  Lysle of Condition: Chronic (continuous duration > 3 months)   Laterality: Rt  Current Functional Measure Score: DASH 52.3%  Objective measurements identify impairments when they are compared to normal values, the uninvolved extremity, and prior level of function.  [x]  Yes  []  No  Objective assessment of functional ability: Severe functional limitations   Briefly describe symptoms:  Pt presents to PT with reports of chronic R shoulder pain and recent onset of R hip pain. R shoulder is main compliant today overall, hip pain is only present when he  wakes up at night. Pain is worst with OH reaching and lifting of R shoulder, limiting overall function. Has some occasional N/T in L hand, has had carpal tunnel release in past. Notes that he had his L RTC repaired about 25 years ago, was supposed to also have R repaired but did not want to pursue surgery.   How did symptoms start: Chronic pain over last 25+ years with right shoulder  Average pain intensity:  Last 24 hours: 3/10  Past week: 5/10  How often does the pt experience symptoms? Constantly  How much have the symptoms interfered with usual daily activities? Quite a bit  How has condition changed since care began at this facility? NA - initial visit  In general, how is the patients overall health? Good   BACK PAIN (STarT Back Screening Tool) No  Harlene Persons, PTA 02/28/2024 3:31 PM Phone: [phone] Fax: [phone]    Joneen Fresh PT, DPT, LAT, ATC  02/28/2024  3:36 PM     "

## 2024-03-10 ENCOUNTER — Other Ambulatory Visit: Payer: Self-pay | Admitting: Family Medicine

## 2024-03-10 DIAGNOSIS — M1000 Idiopathic gout, unspecified site: Secondary | ICD-10-CM

## 2024-03-10 MED ORDER — ALLOPURINOL 300 MG PO TABS: ORAL_TABLET | ORAL | 3 refills | Status: AC

## 2024-03-10 NOTE — Telephone Encounter (Signed)
 Copied from CRM #[id number]. Topic: Clinical - Medication Refill >> Mar 10, 2024 12:36 PM Alexandria E wrote: Medication: allopurinol  (ZYLOPRIM ) 300 MG tablet   Has the patient contacted their pharmacy? Yes (Agent: If no, request that the patient contact the pharmacy for the refill. If patient does not wish to contact the pharmacy document the reason why and proceed with request.) (Agent: If yes, when and what did the pharmacy advise?)  This is the patient's preferred pharmacy:  [facility] - [place], [place] - [address] [facility] [address] [place] [address] [place] [address] Phone: [phone] Fax: [phone]    Is this the correct pharmacy for this prescription? Yes If no, delete pharmacy and type the correct one.   Has the prescription been filled recently? No  Is the patient out of the medication? Yes  Has the patient been seen for an appointment in the last year OR does the patient have an upcoming appointment? Yes  Can we respond through MyChart? Yes  Agent: Please be advised that Rx refills may take up to 3 business days. We ask that you follow-up with your pharmacy.

## 2024-03-13 ENCOUNTER — Ambulatory Visit: Payer: Medicare Other | Admitting: Nurse Practitioner

## 2024-03-14 ENCOUNTER — Ambulatory Visit

## 2024-03-14 VITALS — BP 140/70 | HR 76 | Ht 71.0 in | Wt 179.0 lb

## 2024-03-14 DIAGNOSIS — H9193 Unspecified hearing loss, bilateral: Secondary | ICD-10-CM

## 2024-03-14 DIAGNOSIS — Z0000 Encounter for general adult medical examination without abnormal findings: Secondary | ICD-10-CM

## 2024-03-14 NOTE — Progress Notes (Signed)
 "  Chief Complaint  Patient presents with   Medicare Wellness     Subjective:   Jeremy Boone is a 89 y.o. male who presents for a Medicare Annual Wellness Visit.  Visit info / Clinical Intake: Medicare Wellness Visit Type:: Subsequent Annual Wellness Visit Persons participating in visit and providing information:: patient Medicare Wellness Visit Mode:: In-person (required for WTM) Interpreter Needed?: No Pre-visit prep was completed: yes AWV questionnaire completed by patient prior to visit?: no Living arrangements:: lives with spouse/significant other Patient's Overall Health Status Rating: good Typical amount of pain: none Does pain affect daily life?: no Are you currently prescribed opioids?: no  Dietary Habits and Nutritional Risks How many meals a day?: 2 Eats fruit and vegetables daily?: yes Most meals are obtained by: preparing own meals; having others provide food In the last 2 weeks, have you had any of the following?: none Diabetic:: no  Functional Status Activities of Daily Living (to include ambulation/medication): Independent Ambulation: Independent with device- listed below Home Assistive Devices/Equipment: Cane; Eyeglasses; CPAP Medication Administration: Independent Home Management (perform basic housework or laundry): Independent Manage your own finances?: yes Primary transportation is: driving Concerns about vision?: no *vision screening is required for WTM* Concerns about hearing?: no  Fall Screening Falls in the past year?: 0 Number of falls in past year: 0 Was there an injury with Fall?: 0 Fall Risk Category Calculator: 0 Patient Fall Risk Level: Low Fall Risk  Fall Risk Patient at Risk for Falls Due to: Impaired balance/gait Fall risk Follow up: Falls evaluation completed; Falls prevention discussed  Home and Transportation Safety: All rugs have non-skid backing?: N/A, no rugs All stairs or steps have railings?: yes Grab bars in the  bathtub or shower?: (!) no Have non-skid surface in bathtub or shower?: yes Good home lighting?: yes Regular seat belt use?: yes Hospital stays in the last year:: no  Cognitive Assessment Difficulty concentrating, remembering, or making decisions? : no Will 6CIT or Mini Cog be Completed: yes What year is it?: 0 points What month is it?: 0 points Give patient an address phrase to remember (5 components): [address] [place], [place] About what time is it?: 0 points Count backwards from 20 to 1: 0 points Say the months of the year in reverse: 0 points Repeat the address phrase from earlier: 0 points 6 CIT Score: 0 points  Advance Directives (For Healthcare) Does Patient Have a Medical Advance Directive?: No Does patient want to make changes to medical advance directive?: No - Patient declined Type of Advance Directive: Living will; Healthcare Power of Gabriella (daughter is POA) Copy of Healthcare Power of Attorney in Chart?: No - copy requested Copy of Living Will in Chart?: No - copy requested Would patient like information on creating a medical advance directive?: No - Patient declined  Reviewed/Updated  Reviewed/Updated: Reviewed All (Medical, Surgical, Family, Medications, Allergies, Care Teams, Patient Goals)    Allergies (verified) Ace inhibitors and Lipitor [atorvastatin]   Current Medications (verified) Outpatient Encounter Medications as of 03/14/2024  Medication Sig   albuterol  (VENTOLIN  HFA) 108 (90 Base) MCG/ACT inhaler Inhale 2 puffs into the lungs every 6 (six) hours as needed for wheezing or shortness of breath.   allopurinol  (ZYLOPRIM ) 300 MG tablet Take  1 tablet  Daily for For Gout Prevention                                                                /  TAKE                                         BY                                                 MOUTH   amLODipine  (NORVASC ) 2.5 MG tablet TAKE 1  TABLET BY MOUTH DAILY   Cholecalciferol (VITAMIN D  PO) Take 5,000 Int'l Units by mouth 2 (two) times daily.   ezetimibe  (ZETIA ) 10 MG tablet TAKE 1 TABLET BY MOUTH  DAILY FOR CHOLESTEROL   famotidine  (PEPCID ) 20 MG tablet TAKE 1 TABLET BY MOUTH TWICE  DAILY BEFORE BREAKFAST AND  SUPPER TO PREVENT HEARTBURN AND  INDIGESTION   fluticasone  (FLONASE ) 50 MCG/ACT nasal spray SPRAY 2 SPRAYS INTO EACH NOSTRIL EVERY DAY   furosemide  (LASIX ) 40 MG tablet Take 1 tablet Daily as needed for  Fluid Retention / Ankle Swelling   gabapentin  (NEURONTIN ) 300 MG capsule TAKE 1 TO 2 CAPSULES BY MOUTH 1  TO 2 HOURS BEFORE BEDTIME AS  NEEDED FOR SLEEP   gemfibrozil  (LOPID ) 600 MG tablet TAKE 1 TABLET BY MOUTH TWICE  DAILY WITH MEALS FOR CHOLESTEROL   oxybutynin  (DITROPAN -XL) 10 MG 24 hr tablet Take  1 tablet Daily for Bladder                                                                 /                                                     TAKE                       BY                                                 MOUTH (Patient taking differently: Take 10 mg by mouth daily as needed (retention). Take  1 tablet Daily for Bladder                                                                 /                                                     TAKE  BY                                                 MOUTH)   thiamine  (VITAMIN B-1) 100 MG tablet Take 1 tablet (100 mg total) by mouth daily.   aspirin  EC 81 MG tablet Take 81 mg by mouth daily. Patient was told to start taking per Dr, but Patient hasn't started 03/16/2023. (Patient not taking: Reported on 03/14/2024)   No facility-administered encounter medications on file as of 03/14/2024.    History: Past Medical History:  Diagnosis Date   Alcohol consumption of four to five drinks per day    Gout    Hyperlipidemia    Hypertension    OSA on CPAP    Prediabetes    Recurrent spontaneous pneumothorax 12/2022   Initial pneumothorax October 2024  treated with chest tubes; readmitted January 2024 current spontaneous pneumothorax   Past Surgical History:  Procedure Laterality Date   Cardiac Event Monitor  May-June 2017   Sinus rhythm with average heart rate 74.2 - no notable arrhythmias. Minimal PVCs. No PACs.   CARPAL TUNNEL RELEASE Left 2007   CATARACT EXTRACTION, BILATERAL     OTHER SURGICAL HISTORY  2007   Negative biopsy tonsil mass   ROTATOR CUFF REPAIR Left 2000   Family History  Problem Relation Age of Onset   Hypertension Mother    Stroke Father    Seizures Sister    Colon cancer Neg Hx    Stomach cancer Neg Hx    Esophageal cancer Neg Hx    Inflammatory bowel disease Neg Hx    Liver disease Neg Hx    Pancreatic cancer Neg Hx    Rectal cancer Neg Hx    Social History   Occupational History   Not on file  Tobacco Use   Smoking status: Never   Smokeless tobacco: Never  Vaping Use   Vaping status: Never Used  Substance and Sexual Activity   Alcohol use: Yes    Alcohol/week: 17.0 standard drinks of alcohol    Types: 3 Shots of liquor, 14 Standard drinks or equivalent per week    Comment: 2 drinks/day   Drug use: No   Sexual activity: Yes   Tobacco Counseling Counseling given: No  SDOH Screenings   Food Insecurity: No Food Insecurity (03/14/2024)  Housing: High Risk (03/14/2024)  Transportation Needs: No Transportation Needs (03/14/2024)  Utilities: At Risk (03/14/2024)  Alcohol Screen: Medium Risk (05/27/2023)  Depression (PHQ2-9): Medium Risk (03/14/2024)  Financial Resource Strain: Medium Risk (05/27/2023)  Physical Activity: Inactive (03/14/2024)  Social Connections: Moderately Integrated (03/14/2024)  Stress: Stress Concern Present (03/14/2024)  Tobacco Use: Low Risk (03/14/2024)  Health Literacy: Adequate Health Literacy (03/14/2024)   See flowsheets for full screening details  Depression Screen PHQ 2 & 9 Depression Scale- Over the past 2 weeks, how often have you been bothered by any of the  following problems? Little interest or pleasure in doing things: 0 Feeling down, depressed, or hopeless (PHQ Adolescent also includes...irritable): 1 (Sick Spouse) PHQ-2 Total Score: 1 Trouble falling or staying asleep, or sleeping too much: 1 Feeling tired or having little energy: 0 Poor appetite or overeating (PHQ Adolescent also includes...weight loss): 0 Feeling bad about yourself - or that you are a failure or have let yourself or your family down: 0 Trouble concentrating on things, such  as reading the newspaper or watching television [facility] Adolescent also includes...like school work): 0 Moving or speaking so slowly that other people could have noticed. Or the opposite - being so fidgety or restless that you have been moving around a lot more than usual: 3 (uses a cane) Thoughts that you would be better off dead, or of hurting yourself in some way: 0 PHQ-9 Total Score: 5 If you checked off any problems, how difficult have these problems made it for you to do your work, take care of things at home, or get along with other people?: Somewhat difficult  Depression Treatment Depression Interventions/Treatment : Statistician Referral; [facility] Provided (pt is concerned about sick Spouse and finances)     Goals Addressed               This Visit's Progress     Patient Stated (pt-stated)        Patient stated he plans to continue taking meds daily and trying not to have any falls and managing his bp readings             Objective:    Today's Vitals   03/14/24 1508 03/14/24 1539  BP: (!) 140/68 (!) 140/70  Pulse: 76   SpO2: 97%   Weight: 179 lb (81.2 kg)   Height: 5' 11 (1.803 m)    Body mass index is 24.97 kg/m.  Hearing/Vision screen Hearing Screening - Comments:: C/o hearing difficulties - referral to Audiology Vision Screening - Comments:: Wears rx glasses - up to date with routine eye exams with [facility] Immunizations and Health  Maintenance Health Maintenance  Topic Date Due   Zoster Vaccines- Shingrix (1 of 2) 03/19/1953   COVID-19 Vaccine (5 - 2025-26 season) 11/01/2023   DTaP/Tdap/Td (3 - Td or Tdap) 10/24/2024   Medicare Annual Wellness (AWV)  03/14/2025   Pneumococcal Vaccine: 50+ Years  Completed   Influenza Vaccine  Completed   Meningococcal B Vaccine  Aged Out        Assessment/Plan:  This is a routine wellness examination for [facility].  Referral to [facility]: pt c/o difficulty hearing  Referrals for Financial Assistance - for utilities & rent  Patient Care Team: Alvia Corean CROME, FNP as PCP - General (Family Medicine) Anner Alm ORN, MD as PCP - Cardiology (Cardiology) Regal Carlin, MD as Consulting Physician (Ophthalmology) [place], Lynwood SQUIBB, MD (Inactive) as Consulting Physician (Orthopedic Surgery) Debrah Lamar BIRCH, MD (Inactive) as Consulting Physician (Gastroenterology) Delon George as Physician Assistant (Sleep Medicine)  I have personally reviewed and noted the following in the patients chart:   Medical and social history Use of alcohol, tobacco or illicit drugs  Current medications and supplements including opioid prescriptions. Functional ability and status Nutritional status Physical activity Advanced directives List of other physicians Hospitalizations, surgeries, and ER visits in previous 12 months Vitals Screenings to include cognitive, depression, and falls Referrals and appointments  Orders Placed This Encounter  Procedures   Ambulatory referral to Audiology    Referral Priority:   Routine    Referral Type:   Audiology Exam    Referral Reason:   Specialty Services Required    Referred to Provider:   Helane Darryle LABOR, AUD    Number of Visits Requested:   1   In addition, I have reviewed and discussed with patient certain preventive protocols, quality metrics, and best practice recommendations. A written personalized care plan for preventive services  as well as general preventive health recommendations were  provided to patient.   Verdie CHRISTELLA Saba, CMA   03/14/2024   Return in 1 year (on 03/14/2025).  After Visit Summary: (In Person-Declined) Patient declined AVS at this time.  Nurse Notes: scheduled CPE w/PCP for 03/20/2024; scheduled 2027 AWV appt. "

## 2024-03-14 NOTE — Patient Instructions (Addendum)
 Mr. Earnest,  Thank you for taking the time for your Medicare Wellness Visit. I appreciate your continued commitment to your health goals. Please review the care plan we discussed, and feel free to reach out if I can assist you further.  Please note that Annual Wellness Visits do not include a physical exam. Some assessments may be limited, especially if the visit was conducted virtually. If needed, we may recommend an in-person follow-up with your provider.  Ongoing Care Seeing your primary care provider every 3 to 6 months helps us  monitor your health and provide consistent, personalized care.   Referrals If a referral was made during today's visit and you haven't received any updates within two weeks, please contact the referred provider directly to check on the status.  Recommended Screenings:  Health Maintenance  Topic Date Due   Zoster (Shingles) Vaccine (1 of 2) 03/19/1953   COVID-19 Vaccine (5 - 2025-26 season) 11/01/2023   DTaP/Tdap/Td vaccine (3 - Td or Tdap) 10/24/2024   Medicare Annual Wellness Visit  03/14/2025   Pneumococcal Vaccine for age over 25  Completed   Flu Shot  Completed   Meningitis B Vaccine  Aged Out       12/21/2023    2:22 PM  Advanced Directives  Does Patient Have a Medical Advance Directive? Yes    Vision: Annual vision screenings are recommended for early detection of glaucoma, cataracts, and diabetic retinopathy. These exams can also reveal signs of chronic conditions such as diabetes and high blood pressure.  Dental: Annual dental screenings help detect early signs of oral cancer, gum disease, and other conditions linked to overall health, including heart disease and diabetes.

## 2024-03-15 ENCOUNTER — Ambulatory Visit: Attending: Family Medicine

## 2024-03-15 DIAGNOSIS — M6281 Muscle weakness (generalized): Secondary | ICD-10-CM | POA: Insufficient documentation

## 2024-03-15 DIAGNOSIS — M25511 Pain in right shoulder: Secondary | ICD-10-CM | POA: Diagnosis present

## 2024-03-15 DIAGNOSIS — G8929 Other chronic pain: Secondary | ICD-10-CM | POA: Diagnosis present

## 2024-03-15 NOTE — Therapy (Signed)
 " OUTPATIENT PHYSICAL THERAPY TREATMENT   Patient Name: Jeremy Boone MRN: [id number] DOB:23-Oct-1934, 89 y.o., male Today's Date: 03/16/2024  END OF SESSION:  PT End of Session - 03/15/24 1620     Visit Number 8    Number of Visits 17    Date for Recertification  04/10/24    Authorization Type [employer] MCR- needs auth    Authorization Time Period 6 PT visits from 12/21/23-02/15/24  ; additional visits requested    PT Start Time 1530    PT Stop Time 1610    PT Time Calculation (min) 40 min    Activity Tolerance Patient tolerated treatment well               Past Medical History:  Diagnosis Date   Alcohol consumption of four to five drinks per day    Gout    Hyperlipidemia    Hypertension    OSA on CPAP    Prediabetes    Recurrent spontaneous pneumothorax 12/2022   Initial pneumothorax October 2024 treated with chest tubes; readmitted January 2024 current spontaneous pneumothorax   Past Surgical History:  Procedure Laterality Date   Cardiac Event Monitor  May-June 2017   Sinus rhythm with average heart rate 74.2 - no notable arrhythmias. Minimal PVCs. No PACs.   CARPAL TUNNEL RELEASE Left 2007   CATARACT EXTRACTION, BILATERAL     OTHER SURGICAL HISTORY  2007   Negative biopsy tonsil mass   ROTATOR CUFF REPAIR Left 2000   Patient Active Problem List   Diagnosis Date Noted   Adult wellness visit 01/25/2024   Immunization due 12/07/2023   Nasal congestion 09/06/2023   Right hip pain 09/06/2023   Allergic rhinitis 06/03/2023   BMI 24.0-24.9, adult 06/03/2023   Leg swelling 03/27/2023   Left carotid bruit 03/27/2023   Mild aortic stenosis by prior echocardiogram 12/25/2022   Spontaneous pneumothorax 12/24/2022   Demand ischemia (HCC) 12/24/2022   Alcohol use 12/24/2022   Insomnia 06/27/2020   Statin myopathy 11/21/2019   Constipation 12/15/2017   Palpitations 07/31/2015   OSA on CPAP 04/19/2015   Vitamin D  deficiency 09/14/2013   Medication management  09/14/2013   History of colonic polyps 08/02/2013   Gastroesophageal reflux disease without esophagitis 08/02/2013   Hyperlipidemia, mixed    Essential hypertension    Idiopathic gout    Abnormal glucose     PCP: Alvia Corean CROME, FNP  REFERRING PROVIDER: Alvia Corean CROME, FNP  REFERRING DIAG:  [phone] (ICD-10-CM) - Chronic right shoulder pain M25.551 (ICD-10-CM) - Right hip pain  THERAPY DIAG:  Chronic right shoulder pain  Muscle weakness (generalized)  Rationale for Evaluation and Treatment: Rehabilitation  ONSET DATE: Chronic  SUBJECTIVE:  SUBJECTIVE STATEMENT:  Has been compliant with some HEP. Still cannot raise arm very high. Pain 4/10.   EVAL: Pt presents to PT with reports of chronic R shoulder pain and recent onset of R hip pain. R shoulder is main compliant today overall, hip pain is only present when he wakes up at night. Pain is worst with OH reaching and lifting of R shoulder, limiting overall function. Has some occasional N/T in L hand, has had carpal tunnel release in past. Notes that he had his L RTC repaired about 25 years ago, was supposed to also have R repaired but did not want to pursue surgery.   Hand dominance: Right  PERTINENT HISTORY: HTN  PAIN:   6/10 Are you having pain?  Yes: NPRS scale: 0/10 Worst: 4-5/10 Pain location: R shoulder Pain description: sharp, sore, tight Aggravating factors: OH reaching, lifting Relieving factors: rest  PRECAUTIONS: None  RED FLAGS: None   WEIGHT BEARING RESTRICTIONS: No  FALLS:  Has patient fallen in last 6 months? No   LIVING ENVIRONMENT: Lives with: lives alone Lives in: House/apartment Stairs: Yes: Internal: 10 steps; on right going up Has following equipment at home: Single point  cane  OCCUPATION: Retired  PLOF: Independent with basic ADLs  PATIENT GOALS: patient wants to decrease difficulty with hygiene and dressing in regards to R shoulder  NEXT MD VISIT:   OBJECTIVE:  Note: Objective measures were completed at Evaluation unless otherwise noted.  DIAGNOSTIC FINDINGS:  N/A  PATIENT SURVEYS:  Quick Dash: 52.3% 02/28/24: 45.5%   COGNITION: Overall cognitive status: Within functional limits for tasks assessed     SENSATION: WFL  POSTURE: Rounded shoulders, forward head  UPPER EXTREMITY ROM:   Active ROM Right eval Left eval Right 02/02/24 Right 02/01/24  Shoulder flexion 70 150 90A 120AA 90 standing 120 supine   Shoulder extension      Shoulder abduction    80  Shoulder adduction      Shoulder internal rotation      Shoulder external rotation 55 15    Elbow flexion      Elbow extension      Wrist flexion      Wrist extension      Wrist ulnar deviation      Wrist radial deviation      Wrist pronation      Wrist supination      (Blank rows = not tested)  UPPER EXTREMITY MMT:  MMT Right eval Left eval Right 02/28/24  Shoulder flexion 2+ 3+ 2+  Shoulder extension     Shoulder abduction     Shoulder adduction     Shoulder internal rotation 3 4 3   Shoulder external rotation 3 4 3   Middle trapezius     Lower trapezius     Elbow flexion     Elbow extension     Wrist flexion     Wrist extension     Wrist ulnar deviation     Wrist radial deviation     Wrist pronation     Wrist supination     Grip strength (lbs)     (Blank rows = not tested)  SHOULDER SPECIAL TESTS: Impingement tests: Painful arc test: positive   JOINT MOBILITY TESTING:  R GH hypmobility, decreased scapular elevation  PALPATION:  TTP to R upper trap, slightly in R infraspinatus and rhomboids   TREATMENT: 03/15/24 Neuromuscular Reeducation: HEP reassessment and update Shrug x8x3s Row blue TB 2x8x3s Red ER unable, yellow ER unable Standing  ER  x8x3s Sidelying ER 3x8x3s Supine cane press x8, x6 w/ 3# YTB wall slide with ER (palms facing each other) x6      [facility] Adult PT Treatment:                                                DATE: 02/28/24:  Therapeutic Exercise: Supine shoulder flexion long lever red band 2 x 8 Supine punch 3# 8 x 2  Pullovers 3# on dowel 8  x 2  Supine red band ER bilat 8 x 2  UBE L2 x 3 minutes each way  Pulleys OH  Row blue x 20 10 x 2  EXT Green x 20 10 x 2  IR GTB  10 x 2 standing right      [facility] Adult PT Treatment:                                                DATE: 02/02/24 Therapeutic Exercise: UBE level 2.0 x 3 minutes each way for functional activity tolerance  Shoulder pulleys  Row blue x 20  EXT Green x 20 ER Red x 10 IR red x 12 Supine chest press with dowel  Pullovers with dowel  Supine AAROM ER /IR  Supine fwd punch 1x10 2#, 1 x 10 3#  Supine shoulder flexion long lever 2#  Supine red band horiz abdct x 5 -audible joint rubbing Red band hold at 90 with left arm diagonal pull down x 10     [facility] Adult PT Treatment:                                                DATE: 01/17/2024 UBE lvl 1.5 x 4 min for functional activity tolerance Pulley's shoulder flexion x 60 Supine shoulder flexion 2x10 R 2# Supine fwd punch 3x10 2# R shoulder IR/ER 2x10 YTB Standing row 2x10 13# Standing ext 2x10 13# Seated cane ER AAROM 2x10 ea  [facility] Adult PT Treatment:                                                DATE: 01/10/2024 UBE lvl 1.5 x 4 min for functional activity tolerance Pulley's shoulder flexion 2x60 Supine shoulder flexion 3x10 R Supine fwd punch 3x10 2# Supine horizontal abd x 5 RTB - difficult R shoulder IR/ER 2x10 YTB Standing row 3x10 blue band Standing ext 2x10 GTB Seated cane ER AAROM 2x10 ea  PATIENT EDUCATION: Education details: HEP update Person educated: Patient Education method: Explanation, Demonstration, and Handouts Education comprehension: verbalized  understanding and returned demonstration  HOME EXERCISE PROGRAM: Access Code: [email or web address] URL: [email or web address] Date: 03/15/2024 Prepared by: Washington Scot  Exercises - Seated Scapular Retraction  - 1 x daily - 7 x weekly - 3 sets - 10 reps - 3 sec hold - Scapular Retraction with Resistance  - 1 x daily - 7 x weekly - 3 sets - 10 reps - green band hold - Seated  Shoulder Flexion Towel Slide at Table Top  - 1 x daily - 7 x weekly - 2 sets - 10 reps - 5 sec hold - Standing Isometric Shoulder External Rotation with Doorway and Towel Roll  - 1 x daily - 7 x weekly - 2 sets - 10 reps - 5 sec hold - Standing Isometric Shoulder Internal Rotation with Towel Roll at Doorway  - 1 x daily - 7 x weekly - 2 sets - 10 reps - 5 sec hold - Supine Shoulder Flexion Extension Full Range AROM  - 1 x daily - 7 x weekly - 2-3 sets - 10 reps - Sidelying Shoulder External Rotation AROM  - 2 x daily - 5 x weekly - 2 sets - 8 reps - 3 hold - Shoulder Flexion Wall Slide with Resistance Band  - 2 x daily - 5 x weekly - 2 sets - 4-6 reps - 3 hold       11/3  Red TB ER  Red TB IR  Green TB row  5x/wk 1-2x/day, 2x8x3s  ASSESSMENT:  CLINICAL IMPRESSION: Patient tolerated treatment with no significant increases in pain with progressions in R UE loading and stability. Current deficits include: RUE ROM, excessive pain, strength, and functional activity tolerance. As a result, patient would continue to benefit from skilled PT to address said deficits via plan below.     EVAL: Patient is a 89 y.o. M who was seen today for physical therapy evaluation and treatment for chronic R shoulder pain and discomfort. Also notes chronic R hip pain when waking up at night but states shoulder is chief compliant today. Physical findings are consistent with referring provider impression as pt demonstrates decrease in R shoulder AROM, R shoulder strength, and decrease in functional ability. Quick DASH demonstrates severe  disability in performance of home ADLs and community activities. Pt would benefit from skilled PT services working on improving R shoulder strength and ROM in order to decrease pain and improve function.   OBJECTIVE IMPAIRMENTS: Abnormal gait, decreased activity tolerance, decreased balance, decreased endurance, decreased mobility, difficulty walking, decreased ROM, decreased strength, impaired UE functional use, postural dysfunction, and pain  ACTIVITY LIMITATIONS: carrying, lifting, standing, squatting, stairs, transfers, dressing, reach over head, and hygiene/grooming  PARTICIPATION LIMITATIONS: meal prep, driving, shopping, community activity, and yard work  PERSONAL FACTORS: Time since onset of injury/illness/exacerbation are also affecting patient's functional outcome.   REHAB POTENTIAL: Good  CLINICAL DECISION MAKING: Evolving/moderate complexity  EVALUATION COMPLEXITY: Moderate  GOALS: Goals reviewed with patient? No  SHORT TERM GOALS: Target date: 01/11/2024   Pt will be compliant and knowledgeable with initial HEP for improved comfort and carryover Baseline: initial HEP given  Goal status: MET   2.  Pt will self report right shoulder pain no greater than 4/10 for improved comfort and functional ability Baseline: 6/10 at worst Goal status: MET    LONG TERM GOALS: Target date: 04/10/2024    Pt will decrease Quick DASH disability score to no greater than 40% as proxy for functional improvement Baseline: 52.3%  02/25/24: 45.5%  Goal status: ONGOING  2.  Pt will self report R shoulder pain no greater than 2/10 for improved comfort and functional ability Baseline: 6/10 at worst 02/28/24: 4-5/10  Goal status: ONGOING   3.  Pt will improve R shoulder flexion AROM to at least 115 degrees for improved comfort and functional ability Baseline: 70 degrees 02/28/24: 85-90 degrees Goal status: ONGOING  4.  Pt will improve R shoulder MMT  to at least 3+/5 for all tested motions  for improved comfort and function Baseline:  02/28/24: 2+/5-3-/5  Goal status: ONGOING   PLAN:  PT FREQUENCY: 1-2x/week  PT DURATION:  6 weeks   PLANNED INTERVENTIONS: 97164- PT Re-evaluation, 97110-Therapeutic exercises, 97530- Therapeutic activity, 97112- Neuromuscular re-education, 97535- Self Care, [id number]- Manual therapy, [id number]- Gait training, [phone]- Electrical stimulation (unattended), [id number]- Electrical stimulation (manual), 97016- Vasopneumatic device, 20560 (1-2 muscles), 20561 (3+ muscles)- Dry Needling, and Patient/Family education  PLAN FOR NEXT SESSION: assess HEP response, periscapular strengthening, shoulder AAROM and AROM. Focus on R external rotation strengthening at full ROM.  Date of referral: 12/07/2023 Referring provider: Alvia Corean CROME, FNP  Referring diagnosis?  M25.511,G89.29 (ICD-10-CM) - Chronic right shoulder pain M25.551 (ICD-10-CM) - Right hip pain  Treatment diagnosis? (if different than referring diagnosis)  Chronic right shoulder pain Muscle weakness (generalized) Abnormal posture Pain in right hip Other abnormalities of gait and mobility  What was this (referring dx) caused by? Ongoing Issue and Arthritis  Lysle of Condition: Chronic (continuous duration > 3 months)   Laterality: Rt  Current Functional Measure Score: DASH 52.3%  Objective measurements identify impairments when they are compared to normal values, the uninvolved extremity, and prior level of function.  [x]  Yes  []  No  Objective assessment of functional ability: Severe functional limitations   Briefly describe symptoms:  Pt presents to PT with reports of chronic R shoulder pain and recent onset of R hip pain. R shoulder is main compliant today overall, hip pain is only present when he wakes up at night. Pain is worst with OH reaching and lifting of R shoulder, limiting overall function. Has some occasional N/T in L hand, has had carpal tunnel release in past. Notes that he  had his L RTC repaired about 25 years ago, was supposed to also have R repaired but did not want to pursue surgery.   How did symptoms start: Chronic pain over last 25+ years with right shoulder  Average pain intensity:  Last 24 hours: 3/10  Past week: 5/10  How often does the pt experience symptoms? Constantly  How much have the symptoms interfered with usual daily activities? Quite a bit  How has condition changed since care began at this facility? NA - initial visit  In general, how is the patients overall health? Good   BACK PAIN (STarT Back Screening Tool) No     Washington Odessia Scot  PT, DPT      "

## 2024-03-20 ENCOUNTER — Encounter: Payer: Self-pay | Admitting: Family Medicine

## 2024-03-20 ENCOUNTER — Ambulatory Visit: Admitting: Family Medicine

## 2024-03-20 VITALS — BP 132/84 | HR 65 | Temp 98.2°F | Ht 71.0 in | Wt 179.0 lb

## 2024-03-20 DIAGNOSIS — I872 Venous insufficiency (chronic) (peripheral): Secondary | ICD-10-CM | POA: Diagnosis not present

## 2024-03-20 DIAGNOSIS — Z0001 Encounter for general adult medical examination with abnormal findings: Secondary | ICD-10-CM

## 2024-03-20 DIAGNOSIS — E559 Vitamin D deficiency, unspecified: Secondary | ICD-10-CM

## 2024-03-20 DIAGNOSIS — E782 Mixed hyperlipidemia: Secondary | ICD-10-CM

## 2024-03-20 DIAGNOSIS — Z0000 Encounter for general adult medical examination without abnormal findings: Secondary | ICD-10-CM

## 2024-03-20 DIAGNOSIS — N3281 Overactive bladder: Secondary | ICD-10-CM | POA: Diagnosis not present

## 2024-03-20 DIAGNOSIS — J3089 Other allergic rhinitis: Secondary | ICD-10-CM | POA: Diagnosis not present

## 2024-03-20 DIAGNOSIS — R7309 Other abnormal glucose: Secondary | ICD-10-CM | POA: Diagnosis not present

## 2024-03-20 DIAGNOSIS — G4733 Obstructive sleep apnea (adult) (pediatric): Secondary | ICD-10-CM

## 2024-03-20 DIAGNOSIS — K219 Gastro-esophageal reflux disease without esophagitis: Secondary | ICD-10-CM

## 2024-03-20 DIAGNOSIS — G720 Drug-induced myopathy: Secondary | ICD-10-CM

## 2024-03-20 DIAGNOSIS — M150 Primary generalized (osteo)arthritis: Secondary | ICD-10-CM | POA: Diagnosis not present

## 2024-03-20 DIAGNOSIS — I10 Essential (primary) hypertension: Secondary | ICD-10-CM

## 2024-03-20 LAB — CBC WITH DIFFERENTIAL/PLATELET
Basophils Absolute: 0.1 K/uL (ref 0.0–0.1)
Basophils Relative: 1.1 % (ref 0.0–3.0)
Eosinophils Absolute: 0.3 K/uL (ref 0.0–0.7)
Eosinophils Relative: 5.7 % — ABNORMAL HIGH (ref 0.0–5.0)
HCT: 44 % (ref 39.0–52.0)
Hemoglobin: 14.3 g/dL (ref 13.0–17.0)
Lymphocytes Relative: 47.5 % — ABNORMAL HIGH (ref 12.0–46.0)
Lymphs Abs: 2.4 K/uL (ref 0.7–4.0)
MCHC: 32.6 g/dL (ref 30.0–36.0)
MCV: 92.7 fl (ref 78.0–100.0)
Monocytes Absolute: 0.6 K/uL (ref 0.1–1.0)
Monocytes Relative: 11.4 % (ref 3.0–12.0)
Neutro Abs: 1.7 K/uL (ref 1.4–7.7)
Neutrophils Relative %: 34.3 % — ABNORMAL LOW (ref 43.0–77.0)
Platelets: 263 K/uL (ref 150.0–400.0)
RBC: 4.74 Mil/uL (ref 4.22–5.81)
RDW: 14.6 % (ref 11.5–15.5)
WBC: 5 K/uL (ref 4.0–10.5)

## 2024-03-20 LAB — LIPID PANEL
Cholesterol: 200 mg/dL (ref 28–200)
HDL: 94.5 mg/dL
LDL Cholesterol: 98 mg/dL (ref 10–99)
NonHDL: 105.04
Total CHOL/HDL Ratio: 2
Triglycerides: 35 mg/dL (ref 10.0–149.0)
VLDL: 7 mg/dL (ref 0.0–40.0)

## 2024-03-20 LAB — COMPREHENSIVE METABOLIC PANEL WITH GFR
ALT: 11 U/L (ref 3–53)
AST: 24 U/L (ref 5–37)
Albumin: 4.4 g/dL (ref 3.5–5.2)
Alkaline Phosphatase: 72 U/L (ref 39–117)
BUN: 13 mg/dL (ref 6–23)
CO2: 28 meq/L (ref 19–32)
Calcium: 9.4 mg/dL (ref 8.4–10.5)
Chloride: 102 meq/L (ref 96–112)
Creatinine, Ser: 0.94 mg/dL (ref 0.40–1.50)
GFR: 72.63 mL/min
Glucose, Bld: 82 mg/dL (ref 70–99)
Potassium: 4.2 meq/L (ref 3.5–5.1)
Sodium: 138 meq/L (ref 135–145)
Total Bilirubin: 0.4 mg/dL (ref 0.2–1.2)
Total Protein: 8.4 g/dL — ABNORMAL HIGH (ref 6.0–8.3)

## 2024-03-20 LAB — TSH: TSH: 1.71 u[IU]/mL (ref 0.35–5.50)

## 2024-03-20 LAB — VITAMIN B12: Vitamin B-12: 672 pg/mL (ref 211–911)

## 2024-03-20 LAB — MICROALBUMIN / CREATININE URINE RATIO
Creatinine,U: 41.7 mg/dL
Microalb Creat Ratio: 30.5 mg/g — ABNORMAL HIGH (ref 0.0–30.0)
Microalb, Ur: 1.3 mg/dL (ref 0.7–1.9)

## 2024-03-20 LAB — HEMOGLOBIN A1C: Hgb A1c MFr Bld: 6.2 % (ref 4.6–6.5)

## 2024-03-20 LAB — VITAMIN D 25 HYDROXY (VIT D DEFICIENCY, FRACTURES): VITD: 63.57 ng/mL (ref 30.00–100.00)

## 2024-03-20 MED ORDER — FAMOTIDINE 20 MG PO TABS: ORAL_TABLET | ORAL | 2 refills | Status: AC

## 2024-03-20 MED ORDER — FLUTICASONE PROPIONATE 50 MCG/ACT NA SUSP: 2.0000 | Freq: Every day | NASAL | 1 refills | Status: AC

## 2024-03-20 MED ORDER — FUROSEMIDE 40 MG PO TABS: ORAL_TABLET | ORAL | 3 refills | Status: AC

## 2024-03-20 MED ORDER — GEMFIBROZIL 600 MG PO TABS: ORAL_TABLET | ORAL | 1 refills | Status: AC

## 2024-03-20 MED ORDER — EZETIMIBE 10 MG PO TABS: ORAL_TABLET | ORAL | 2 refills | Status: AC

## 2024-03-20 MED ORDER — OXYBUTYNIN CHLORIDE ER 10 MG PO TB24: ORAL_TABLET | ORAL | 3 refills | Status: AC

## 2024-03-20 NOTE — Progress Notes (Signed)
 "  Annual Physical Exam  Subjective:     Patient ID: Jeremy Boone, male    DOB: Oct 19, 1934, 89 y.o.   MRN: [id number]  No chief complaint on file.   HPI  Discussed the use of AI scribe software for clinical note transcription with the patient, who gave verbal consent to proceed.  Urology? PSA? History of Present Illness Jeremy Boone is an 89 year old male who presents with pain and discomfort experienced primarily while lying down.  Paresthesia associated with recumbency - Pins-and-needles sensation occurs in various body areas exclusively when lying in bed - No symptoms present when upright or ambulating  Shoulder pain with physical therapy - Currently undergoing physical therapy for shoulder pain - Exercises involving arm raising or resistance provoke significant pain - Unable to perform physical therapy exercises the day prior to visit due to pain - Pain is provoked by movement or resistance and absent at rest - Rubbing sensation present in the shoulder - Concerned about possible muscle tear  Use of heat therapy - Has not been using heat therapy prior to exercises - Believes heat therapy may provide symptomatic relief     ROS Per HPI  Most recent fall risk assessment:    03/14/2024    3:09 PM  Fall Risk   Falls in the past year? 0  Number falls in past yr: 0  Injury with Fall? 0  Risk for fall due to : Impaired balance/gait  Follow up Falls evaluation completed;Falls prevention discussed    Most recent depression screenings:    03/20/2024   11:06 AM 03/14/2024    3:25 PM  PHQ 2/9 Scores  PHQ - 2 Score 2 1  PHQ- 9 Score 6 5    Vision:Within last year and Dental: No current dental problems and Receives regular dental care  Patient Care Team: Alvia Corean CROME, FNP as PCP - General (Family Medicine) Anner Alm ORN, MD as PCP - Cardiology (Cardiology) Carrie Dunnings, MD as Consulting Physician (Ophthalmology) [place], Lynwood SQUIBB, MD  (Inactive) as Consulting Physician (Orthopedic Surgery) Debrah Lamar BIRCH, MD (Inactive) as Consulting Physician (Gastroenterology) Delon George as Physician Assistant (Sleep Medicine)   Show/hide medication list[1]     Objective:    BP 132/84 (BP Location: Left Arm, Patient Position: Sitting)   Pulse 65   Temp 98.2 F (36.8 C) (Temporal)   Ht 5' 11 (1.803 m)   Wt 179 lb (81.2 kg)   SpO2 95%   BMI 24.97 kg/m    Physical Exam Vitals and nursing note reviewed.  Constitutional:      General: He is not in acute distress.    Appearance: Normal appearance.  HENT:     Head: Normocephalic and atraumatic.     Right Ear: External ear normal.     Left Ear: External ear normal.     Nose: Nose normal.     Mouth/Throat:     Mouth: Mucous membranes are moist.     Pharynx: Oropharynx is clear.  Eyes:     Extraocular Movements: Extraocular movements intact.  Neck:     Vascular: No carotid bruit.  Cardiovascular:     Rate and Rhythm: Normal rate and regular rhythm.     Pulses: Normal pulses.     Heart sounds: Normal heart sounds.  Pulmonary:     Effort: Pulmonary effort is normal. No respiratory distress.     Breath sounds: Normal breath sounds. No wheezing, rhonchi or rales.  Abdominal:  General: Bowel sounds are normal. There is no distension.     Palpations: There is no mass.     Tenderness: There is no abdominal tenderness. There is no guarding or rebound.     Hernia: No hernia is present.  Musculoskeletal:     Right lower leg: No edema.     Left lower leg: No edema.     Comments: Using single-point cane  Lymphadenopathy:     Cervical: No cervical adenopathy.  Skin:    General: Skin is warm and dry.  Neurological:     General: No focal deficit present.     Mental Status: He is alert and oriented to person, place, and time.  Psychiatric:        Mood and Affect: Mood normal.        Behavior: Behavior normal.     No results found for any visits on  03/20/24.  BP Readings from Last 3 Encounters:  03/20/24 132/84  03/14/24 (!) 140/70  12/07/23 (!) 142/82   Wt Readings from Last 3 Encounters:  03/20/24 179 lb (81.2 kg)  03/14/24 179 lb (81.2 kg)  12/07/23 175 lb 12.8 oz (79.7 kg)      Last CBC Lab Results  Component Value Date   WBC 4.2 12/07/2023   HGB 14.2 12/07/2023   HCT 43.0 12/07/2023   MCV 92.4 12/07/2023   MCH 30.7 03/20/2023   RDW 14.5 12/07/2023   PLT 238.0 12/07/2023   Last metabolic panel Lab Results  Component Value Date   GLUCOSE 115 (H) 12/07/2023   NA 138 12/07/2023   K 4.0 12/07/2023   CL 102 12/07/2023   CO2 29 12/07/2023   BUN 12 12/07/2023   CREATININE 0.89 12/07/2023   GFR 76.94 12/07/2023   CALCIUM  9.9 12/07/2023   PROT 8.2 12/07/2023   ALBUMIN 4.5 12/07/2023   BILITOT 0.4 12/07/2023   ALKPHOS 66 12/07/2023   AST 18 12/07/2023   ALT 6 12/07/2023   ANIONGAP 8 03/20/2023   Last lipids Lab Results  Component Value Date   CHOL 188 09/06/2023   HDL 86.50 09/06/2023   LDLCALC 90 09/06/2023   TRIG 62.0 09/06/2023   CHOLHDL 2 09/06/2023   Last hemoglobin A1c Lab Results  Component Value Date   HGBA1C 6.2 09/06/2023   Last thyroid  functions Lab Results  Component Value Date   TSH 2.04 12/07/2023   Last vitamin D  Lab Results  Component Value Date   VD25OH 73.17 12/07/2023   Last vitamin B12 and Folate Lab Results  Component Value Date   VITAMINB12 744 12/07/2023      VAS US  CAROTID Result Date: 03/27/2023 Carotid Arterial Duplex Study Patient Name:  Jeremy Boone  Date of Exam:   03/26/2023 Medical Rec #: [id number]            Accession #:    [id number] Date of Birth: 1934-10-13            Patient Gender: M Patient Age:   24 years Exam Location:  [facility] Procedure:      VAS US  CAROTID Referring Phys: DAVID HARDING --------------------------------------------------------------------------------  Indications:   Left bruit. Risk Factors:  Hypertension, hyperlipidemia, no  history of smoking. Other Factors: OSA. Performing Technologist: Alan Greenhouse RDMS, RVT, RDCS  Examination Guidelines: A complete evaluation includes B-mode imaging, spectral Doppler, color Doppler, and power Doppler as needed of all accessible portions of each vessel. Bilateral testing is considered an integral part of a complete examination. Limited examinations for reoccurring  indications may be performed as noted.  Right Carotid Findings: +----------+--------+--------+--------+-------------------------+--------+           PSV cm/sEDV cm/sStenosisPlaque Description       Comments +----------+--------+--------+--------+-------------------------+--------+ CCA Prox  102     9                                                 +----------+--------+--------+--------+-------------------------+--------+ CCA Distal67      9                                                 +----------+--------+--------+--------+-------------------------+--------+ ICA Prox  60      11              calcific and heterogenous         +----------+--------+--------+--------+-------------------------+--------+ ICA Mid   73      17      1-39%                                     +----------+--------+--------+--------+-------------------------+--------+ ICA Distal61      13                                                +----------+--------+--------+--------+-------------------------+--------+ ECA       103     7                                                 +----------+--------+--------+--------+-------------------------+--------+ +----------+--------+-------+----------------+-------------------+           PSV cm/sEDV cmsDescribe        Arm Pressure (mmHG) +----------+--------+-------+----------------+-------------------+ Subclavian140     0      Multiphasic, [id number]                 +----------+--------+-------+----------------+-------------------+  +---------+--------+--+--------+-+---------+ VertebralPSV cm/s38EDV cm/s7Antegrade +---------+--------+--+--------+-+---------+  Left Carotid Findings: +----------+--------+--------+--------+-------------------------+--------+           PSV cm/sEDV cm/sStenosisPlaque Description       Comments +----------+--------+--------+--------+-------------------------+--------+ CCA Prox  206     9                                                 +----------+--------+--------+--------+-------------------------+--------+ CCA Distal88      8                                                 +----------+--------+--------+--------+-------------------------+--------+ ICA Prox  50      10              calcific and heterogenous         +----------+--------+--------+--------+-------------------------+--------+ ICA Mid   84  20      1-39%                                     +----------+--------+--------+--------+-------------------------+--------+ ICA Distal54      11                                                +----------+--------+--------+--------+-------------------------+--------+ ECA       87      7                                                 +----------+--------+--------+--------+-------------------------+--------+ +----------+--------+--------+----------------+-------------------+           PSV cm/sEDV cm/sDescribe        Arm Pressure (mmHG) +----------+--------+--------+----------------+-------------------+ Subclavian102     0       Multiphasic, [id number]                 +----------+--------+--------+----------------+-------------------+ +---------+--------+--+--------+--+---------+ VertebralPSV cm/s61EDV cm/s11Antegrade +---------+--------+--+--------+--+---------+   Summary: Right Carotid: Velocities in the right ICA are consistent with a 1-39% stenosis.                Intimal hyperplasia without hemodynamically significant stenosis. Left Carotid:  Velocities in the left ICA are consistent with a 1-39% stenosis.               Intimal hyperplasia without hemodynamically significant stenosiss. Vertebrals:  Bilateral vertebral arteries demonstrate antegrade flow. Subclavians: Normal flow hemodynamics were seen in bilateral subclavian              arteries. *See table(s) above for measurements and observations.  Electronically signed by Erick Bergamo on 03/27/2023 at 3:27:11 PM.    Final        Assessment & Plan:   Assessment and Plan Assessment & Plan Adult Well Exam Routine health maintenance discussed. Labs ordered for monitoring. - Ordered labs for routine monitoring.  Essential hypertension - Chronic, stable, requires ongoing monitoring - CBC, CMP, microalbumin today - Continue amlodipine  as prescribed  OSA on CPAP - Continue CPAP - Chronic, stable, requires ongoing monitoring  GERD without esophagitis - Refilled Pepcid  today, continue - Chronic, stable, requires ongoing monitoring  Vitamin D  deficiency - Chronic, stable, requires ongoing monitoring -Continue vitamin D  supplementation -Vitamin D  levels to be checked today  Mixed hyperlipidemia, statin myopathy - Chronic, stable, requires ongoing monitoring -Continue Zetia  and gemfibrozil , refilled today - Lipid levels today  Abnormal glucose - Chronic, stable, requires ongoing monitoring - CMP, A1c today - Continue effort and low carb, low sweets diet and healthy activity level  Allergic rhinitis due to other trigger - Chronic, stable - Refill Flonase  today, continue  Edema to both lower extremities due to peripheral venous insufficiency - Chronic, stable, requires ongoing monitoring - Continue Lasix , refilled today  Overactive bladder - Chronic, stable, requires ongoing monitoring - Continue oxybutynin , refilled  Primary osteoarthritis of multiple joints Chronic right shoulder pain with pain numbness and tingling, worsened by movement and therapy.  -  Apply heat before exercises. - Consider ice post-exercise. - Continue physical therapy. Chronic right hip pain managed with physical therapy. - Continue physical therapy.  Health Maintenance  Topic Date Due   COVID-19 Vaccine (5 - 2025-26 season) 04/05/2024*   Zoster (Shingles) Vaccine (1 of 2) 06/18/2024*   DTaP/Tdap/Td vaccine (3 - Td or Tdap) 10/24/2024   Medicare Annual Wellness Visit  03/14/2025   Pneumococcal Vaccine for age over 54  Completed   Flu Shot  Completed   Meningitis B Vaccine  Aged Out  *Topic was postponed. The date shown is not the original due date.     Discussed health benefits of physical activity, and encouraged him to engage in regular exercise appropriate for his age and condition.  Orders Placed This Encounter  Procedures   Comprehensive metabolic panel with GFR    Release to patient:   Immediate [1]   Hemoglobin A1c   Lipid Profile   TSH   Vitamin D  (25 hydroxy)   Vitamin B12   Microalbumin / creatinine urine ratio   CBC w/Diff     Meds ordered this encounter  Medications   ezetimibe  (ZETIA ) 10 MG tablet    Sig: TAKE 1 TABLET BY MOUTH  DAILY FOR CHOLESTEROL    Dispense:  100 tablet    Refill:  2    Please send a replace/new response with 100-Day Supply if appropriate to maximize member benefit. Requesting 1 year supply.   famotidine  (PEPCID ) 20 MG tablet    Sig: TAKE 1 TABLET BY MOUTH TWICE  DAILY BEFORE BREAKFAST AND  SUPPER TO PREVENT HEARTBURN AND  INDIGESTION    Dispense:  200 tablet    Refill:  2    Please send a replace/new response with 100-Day Supply if appropriate to maximize member benefit. Requesting 1 year supply.   fluticasone  (FLONASE ) 50 MCG/ACT nasal spray    Sig: Place 2 sprays into both nostrils daily.    Dispense:  48 mL    Refill:  1   gemfibrozil  (LOPID ) 600 MG tablet    Sig: TAKE 1 TABLET BY MOUTH TWICE  DAILY WITH MEALS FOR CHOLESTEROL    Dispense:  200 tablet    Refill:  1    Please send a replace/new  response with 100-Day Supply if appropriate to maximize member benefit. Requesting 1 year supply.   furosemide  (LASIX ) 40 MG tablet    Sig: Take 1 tablet Daily as needed for  Fluid Retention / Ankle Swelling    Dispense:  90 tablet    Refill:  3   oxybutynin  (DITROPAN -XL) 10 MG 24 hr tablet    Sig: Take  1 tablet Daily for Bladder                                                                 /                                                     TAKE                       BY  MOUTH    Dispense:  28 each    Refill:  3    Return in about 6 months (around 09/17/2024) for meds eval.  Corean LITTIE Ku, FNP     [1]  Outpatient Medications Prior to Visit  Medication Sig   albuterol  (VENTOLIN  HFA) 108 (90 Base) MCG/ACT inhaler Inhale 2 puffs into the lungs every 6 (six) hours as needed for wheezing or shortness of breath.   allopurinol  (ZYLOPRIM ) 300 MG tablet Take  1 tablet  Daily for For Gout Prevention                                                                /                                                                   TAKE                                         BY                                                 MOUTH   amLODipine  (NORVASC ) 2.5 MG tablet TAKE 1 TABLET BY MOUTH DAILY   Cholecalciferol (VITAMIN D  PO) Take 5,000 Int'l Units by mouth 2 (two) times daily.   gabapentin  (NEURONTIN ) 300 MG capsule TAKE 1 TO 2 CAPSULES BY MOUTH 1  TO 2 HOURS BEFORE BEDTIME AS  NEEDED FOR SLEEP   thiamine  (VITAMIN B-1) 100 MG tablet Take 1 tablet (100 mg total) by mouth daily.   [DISCONTINUED] ezetimibe  (ZETIA ) 10 MG tablet TAKE 1 TABLET BY MOUTH  DAILY FOR CHOLESTEROL   [DISCONTINUED] famotidine  (PEPCID ) 20 MG tablet TAKE 1 TABLET BY MOUTH TWICE  DAILY BEFORE BREAKFAST AND  SUPPER TO PREVENT HEARTBURN AND  INDIGESTION   [DISCONTINUED] fluticasone  (FLONASE ) 50 MCG/ACT nasal spray SPRAY 2 SPRAYS INTO EACH NOSTRIL EVERY DAY   [DISCONTINUED]  furosemide  (LASIX ) 40 MG tablet Take 1 tablet Daily as needed for  Fluid Retention / Ankle Swelling   [DISCONTINUED] gemfibrozil  (LOPID ) 600 MG tablet TAKE 1 TABLET BY MOUTH TWICE  DAILY WITH MEALS FOR CHOLESTEROL   [DISCONTINUED] oxybutynin  (DITROPAN -XL) 10 MG 24 hr tablet Take  1 tablet Daily for Bladder                                                                 /  TAKE                       BY                                                 MOUTH (Patient taking differently: Take 10 mg by mouth daily as needed (retention). Take  1 tablet Daily for Bladder                                                                 /                                                     TAKE                       BY                                                 MOUTH)   aspirin  EC 81 MG tablet Take 81 mg by mouth daily. Patient was told to start taking per Dr, but Patient hasn't started 03/16/2023. (Patient not taking: Reported on 03/20/2024)   No facility-administered medications prior to visit.   "

## 2024-03-20 NOTE — Patient Instructions (Signed)
 May try to use heat to your shoulder prior to exercise to help add some mobility.   We have completed your physical today.   Continue current medication regimen.   We are checking labs today, will be in contact with any results that require further attention  Follow-up with me in 6 mos for medication management, sooner if needed.

## 2024-03-21 ENCOUNTER — Ambulatory Visit: Admitting: Physical Therapy

## 2024-03-21 ENCOUNTER — Encounter: Payer: Self-pay | Admitting: Physical Therapy

## 2024-03-21 DIAGNOSIS — M25511 Pain in right shoulder: Secondary | ICD-10-CM | POA: Diagnosis not present

## 2024-03-21 DIAGNOSIS — G8929 Other chronic pain: Secondary | ICD-10-CM

## 2024-03-21 DIAGNOSIS — M6281 Muscle weakness (generalized): Secondary | ICD-10-CM

## 2024-03-21 NOTE — Therapy (Signed)
 " OUTPATIENT PHYSICAL THERAPY TREATMENT   Patient Name: Seab Axel MRN: [id number] DOB:Jul 10, 1934, 89 y.o., male Today's Date: 03/21/2024  END OF SESSION:  PT End of Session - 03/21/24 1436     Visit Number 9    Number of Visits 17    Date for Recertification  04/10/24    Authorization Type [employer] MCR- needs auth    Authorization Time Period 02/28/24-03/27/24; 8 more visits    Authorization - Visit Number 3    Authorization - Number of Visits 8    PT Start Time 0235    PT Stop Time 0313    PT Time Calculation (min) 38 min               Past Medical History:  Diagnosis Date   Alcohol consumption of four to five drinks per day    Gout    Hyperlipidemia    Hypertension    OSA on CPAP    Prediabetes    Recurrent spontaneous pneumothorax 12/2022   Initial pneumothorax October 2024 treated with chest tubes; readmitted January 2024 current spontaneous pneumothorax   Past Surgical History:  Procedure Laterality Date   Cardiac Event Monitor  May-June 2017   Sinus rhythm with average heart rate 74.2 - no notable arrhythmias. Minimal PVCs. No PACs.   CARPAL TUNNEL RELEASE Left 2007   CATARACT EXTRACTION, BILATERAL     OTHER SURGICAL HISTORY  2007   Negative biopsy tonsil mass   ROTATOR CUFF REPAIR Left 2000   Patient Active Problem List   Diagnosis Date Noted   Adult wellness visit 01/25/2024   Immunization due 12/07/2023   Nasal congestion 09/06/2023   Right hip pain 09/06/2023   Allergic rhinitis 06/03/2023   BMI 24.0-24.9, adult 06/03/2023   Leg swelling 03/27/2023   Left carotid bruit 03/27/2023   Mild aortic stenosis by prior echocardiogram 12/25/2022   Spontaneous pneumothorax 12/24/2022   Demand ischemia (HCC) 12/24/2022   Alcohol use 12/24/2022   Insomnia 06/27/2020   Statin myopathy 11/21/2019   Constipation 12/15/2017   Palpitations 07/31/2015   Obstructive sleep apnea syndrome 04/19/2015   Vitamin D  deficiency 09/14/2013   Medication management  09/14/2013   History of colonic polyps 08/02/2013   Gastroesophageal reflux disease without esophagitis 08/02/2013   Hyperlipidemia, mixed    Essential hypertension    Idiopathic gout    Abnormal glucose     PCP: Alvia Corean CROME, FNP  REFERRING PROVIDER: Alvia Corean CROME, FNP  REFERRING DIAG:  [phone] (ICD-10-CM) - Chronic right shoulder pain M25.551 (ICD-10-CM) - Right hip pain  THERAPY DIAG:  Chronic right shoulder pain  Muscle weakness (generalized)  Rationale for Evaluation and Treatment: Rehabilitation  ONSET DATE: Chronic  SUBJECTIVE:  SUBJECTIVE STATEMENT:  Had a bad day Sunday, with pain. I could not do any exercises.  I still cannot lift my arm. Saw MD yesterday for wellness exam. Did not really talk about the shoulder. I think the doctor mentioned heat and ice for my shoulder.   EVAL: Pt presents to PT with reports of chronic R shoulder pain and recent onset of R hip pain. R shoulder is main compliant today overall, hip pain is only present when he wakes up at night. Pain is worst with OH reaching and lifting of R shoulder, limiting overall function. Has some occasional N/T in L hand, has had carpal tunnel release in past. Notes that he had his L RTC repaired about 25 years ago, was supposed to also have R repaired but did not want to pursue surgery.   Hand dominance: Right  PERTINENT HISTORY: HTN  PAIN:   6/10 Are you having pain?  Yes: NPRS scale: 0/10 Worst: 4-5/10 Pain location: R shoulder Pain description: sharp, sore, tight Aggravating factors: OH reaching, lifting Relieving factors: rest  PRECAUTIONS: None  RED FLAGS: None   WEIGHT BEARING RESTRICTIONS: No  FALLS:  Has patient fallen in last 6 months? No   LIVING ENVIRONMENT: Lives with: lives  alone Lives in: House/apartment Stairs: Yes: Internal: 10 steps; on right going up Has following equipment at home: Single point cane  OCCUPATION: Retired  PLOF: Independent with basic ADLs  PATIENT GOALS: patient wants to decrease difficulty with hygiene and dressing in regards to R shoulder  NEXT MD VISIT:   OBJECTIVE:  Note: Objective measures were completed at Evaluation unless otherwise noted.  DIAGNOSTIC FINDINGS:  N/A  PATIENT SURVEYS:  Quick Dash: 52.3% 02/28/24: 45.5%   COGNITION: Overall cognitive status: Within functional limits for tasks assessed     SENSATION: WFL  POSTURE: Rounded shoulders, forward head  UPPER EXTREMITY ROM:   Active ROM Right eval Left eval Right 02/02/24 Right 02/01/24  Shoulder flexion 70 150 90A 120AA 90 standing 120 supine   Shoulder extension      Shoulder abduction    80  Shoulder adduction      Shoulder internal rotation      Shoulder external rotation 55 15    Elbow flexion      Elbow extension      Wrist flexion      Wrist extension      Wrist ulnar deviation      Wrist radial deviation      Wrist pronation      Wrist supination      (Blank rows = not tested)  UPPER EXTREMITY MMT:  MMT Right eval Left eval Right 02/28/24  Shoulder flexion 2+ 3+ 2+  Shoulder extension     Shoulder abduction     Shoulder adduction     Shoulder internal rotation 3 4 3   Shoulder external rotation 3 4 3   Middle trapezius     Lower trapezius     Elbow flexion     Elbow extension     Wrist flexion     Wrist extension     Wrist ulnar deviation     Wrist radial deviation     Wrist pronation     Wrist supination     Grip strength (lbs)     (Blank rows = not tested)  SHOULDER SPECIAL TESTS: Impingement tests: Painful arc test: positive   JOINT MOBILITY TESTING:  R GH hypmobility, decreased scapular elevation  PALPATION:  TTP to R upper  trap, slightly in R infraspinatus and rhomboids   TREATMENT: [facility] Adult PT  Treatment:                                                DATE: 03/21/24  UBE level 1 2,5 min each way  Pulleys  Standing cane shoulder scaption x 8 Standing cane Er AAROM  Seated row BTB 8  x 3  Supine chest press R 3# 8 x 3  Supine iso pull YTB 3 sec x 8 x 2  Supine shoulder flexion long lever x8, YTB x 8    SL shoulder ER x 8, 1# x 8      03/15/24 Neuromuscular Reeducation: HEP reassessment and update Shrug x8x3s Row blue TB 2x8x3s Red ER unable, yellow ER unable Standing ER x8x3s Sidelying ER 3x8x3s Supine cane press x8, x6 w/ 3# YTB wall slide with ER (palms facing each other) x6      [facility] Adult PT Treatment:                                                DATE: 02/28/24:  Therapeutic Exercise: Supine shoulder flexion long lever red band 2 x 8 Supine punch 3# 8 x 2  Pullovers 3# on dowel 8  x 2  Supine red band ER bilat 8 x 2  UBE L2 x 3 minutes each way  Pulleys OH  Row blue x 20 10 x 2  EXT Green x 20 10 x 2  IR GTB  10 x 2 standing right      [facility] Adult PT Treatment:                                                DATE: 02/02/24 Therapeutic Exercise: UBE level 2.0 x 3 minutes each way for functional activity tolerance  Shoulder pulleys  Row blue x 20  EXT Green x 20 ER Red x 10 IR red x 12 Supine chest press with dowel  Pullovers with dowel  Supine AAROM ER /IR  Supine fwd punch 1x10 2#, 1 x 10 3#  Supine shoulder flexion long lever 2#  Supine red band horiz abdct x 5 -audible joint rubbing Red band hold at 90 with left arm diagonal pull down x 10     [facility] Adult PT Treatment:                                                DATE: 01/17/2024 UBE lvl 1.5 x 4 min for functional activity tolerance Pulley's shoulder flexion x 60 Supine shoulder flexion 2x10 R 2# Supine fwd punch 3x10 2# R shoulder IR/ER 2x10 YTB Standing row 2x10 13# Standing ext 2x10 13# Seated cane ER AAROM 2x10 ea  [facility] Adult PT Treatment:  DATE: 01/10/2024 UBE lvl 1.5 x 4 min for functional activity tolerance Pulley's shoulder flexion 2x60 Supine shoulder flexion 3x10 R Supine fwd punch 3x10 2# Supine horizontal abd x 5 RTB - difficult R shoulder IR/ER 2x10 YTB Standing row 3x10 blue band Standing ext 2x10 GTB Seated cane ER AAROM 2x10 ea  PATIENT EDUCATION: Education details: HEP update Person educated: Patient Education method: Explanation, Demonstration, and Handouts Education comprehension: verbalized understanding and returned demonstration  HOME EXERCISE PROGRAM: Access Code: [email or web address] URL: [email or web address] Date: 03/15/2024 Prepared by: Washington Scot  Exercises - Seated Scapular Retraction  - 1 x daily - 7 x weekly - 3 sets - 10 reps - 3 sec hold - Scapular Retraction with Resistance  - 1 x daily - 7 x weekly - 3 sets - 10 reps - green band hold - Seated Shoulder Flexion Towel Slide at Table Top  - 1 x daily - 7 x weekly - 2 sets - 10 reps - 5 sec hold - Standing Isometric Shoulder External Rotation with Doorway and Towel Roll  - 1 x daily - 7 x weekly - 2 sets - 10 reps - 5 sec hold - Standing Isometric Shoulder Internal Rotation with Towel Roll at Doorway  - 1 x daily - 7 x weekly - 2 sets - 10 reps - 5 sec hold - Supine Shoulder Flexion Extension Full Range AROM  - 1 x daily - 7 x weekly - 2-3 sets - 10 reps - Sidelying Shoulder External Rotation AROM  - 2 x daily - 5 x weekly - 2 sets - 8 reps - 3 hold - Shoulder Flexion Wall Slide with Resistance Band  - 2 x daily - 5 x weekly - 2 sets - 4-6 reps - 3 hold       11/3  Red TB ER  Red TB IR  Green TB row  5x/wk 1-2x/day, 2x8x3s  ASSESSMENT:  CLINICAL IMPRESSION: Patient tolerated treatment with no significant increases in pain with progressions in R UE loading and stability. Current deficits include: RUE ROM, excessive pain, strength, and functional activity tolerance. As a result, patient would continue to benefit from  skilled PT to address said deficits via plan below.     EVAL: Patient is a 89 y.o. M who was seen today for physical therapy evaluation and treatment for chronic R shoulder pain and discomfort. Also notes chronic R hip pain when waking up at night but states shoulder is chief compliant today. Physical findings are consistent with referring provider impression as pt demonstrates decrease in R shoulder AROM, R shoulder strength, and decrease in functional ability. Quick DASH demonstrates severe disability in performance of home ADLs and community activities. Pt would benefit from skilled PT services working on improving R shoulder strength and ROM in order to decrease pain and improve function.   OBJECTIVE IMPAIRMENTS: Abnormal gait, decreased activity tolerance, decreased balance, decreased endurance, decreased mobility, difficulty walking, decreased ROM, decreased strength, impaired UE functional use, postural dysfunction, and pain  ACTIVITY LIMITATIONS: carrying, lifting, standing, squatting, stairs, transfers, dressing, reach over head, and hygiene/grooming  PARTICIPATION LIMITATIONS: meal prep, driving, shopping, community activity, and yard work  PERSONAL FACTORS: Time since onset of injury/illness/exacerbation are also affecting patient's functional outcome.   REHAB POTENTIAL: Good  CLINICAL DECISION MAKING: Evolving/moderate complexity  EVALUATION COMPLEXITY: Moderate  GOALS: Goals reviewed with patient? No  SHORT TERM GOALS: Target date: 01/11/2024   Pt will be compliant and knowledgeable with initial HEP for improved comfort and carryover Baseline:  initial HEP given  Goal status: MET   2.  Pt will self report right shoulder pain no greater than 4/10 for improved comfort and functional ability Baseline: 6/10 at worst Goal status: MET    LONG TERM GOALS: Target date: 04/10/2024    Pt will decrease Quick DASH disability score to no greater than 40% as proxy for functional  improvement Baseline: 52.3%  02/25/24: 45.5%  Goal status: ONGOING  2.  Pt will self report R shoulder pain no greater than 2/10 for improved comfort and functional ability Baseline: 6/10 at worst 02/28/24: 4-5/10  Goal status: ONGOING   3.  Pt will improve R shoulder flexion AROM to at least 115 degrees for improved comfort and functional ability Baseline: 70 degrees 02/28/24: 85-90 degrees Goal status: ONGOING  4.  Pt will improve R shoulder MMT to at least 3+/5 for all tested motions for improved comfort and function Baseline:  02/28/24: 2+/5-3-/5  Goal status: ONGOING   PLAN:  PT FREQUENCY: 1-2x/week  PT DURATION:  6 weeks   PLANNED INTERVENTIONS: 97164- PT Re-evaluation, 97110-Therapeutic exercises, 97530- Therapeutic activity, 97112- Neuromuscular re-education, 97535- Self Care, [id number]- Manual therapy, [id number]- Gait training, [phone]- Electrical stimulation (unattended), [id number]- Electrical stimulation (manual), 97016- Vasopneumatic device, 20560 (1-2 muscles), 20561 (3+ muscles)- Dry Needling, and Patient/Family education  PLAN FOR NEXT SESSION: assess HEP response, periscapular strengthening, shoulder AAROM and AROM. Focus on R external rotation strengthening at full ROM.  Date of referral: 12/07/2023 Referring provider: Alvia Corean CROME, FNP  Referring diagnosis?  M25.511,G89.29 (ICD-10-CM) - Chronic right shoulder pain M25.551 (ICD-10-CM) - Right hip pain  Treatment diagnosis? (if different than referring diagnosis)  Chronic right shoulder pain Muscle weakness (generalized) Abnormal posture Pain in right hip Other abnormalities of gait and mobility  What was this (referring dx) caused by? Ongoing Issue and Arthritis  Lysle of Condition: Chronic (continuous duration > 3 months)   Laterality: Rt  Current Functional Measure Score: DASH 52.3%  Objective measurements identify impairments when they are compared to normal values, the uninvolved extremity, and  prior level of function.  [x]  Yes  []  No  Objective assessment of functional ability: Severe functional limitations   Briefly describe symptoms:  Pt presents to PT with reports of chronic R shoulder pain and recent onset of R hip pain. R shoulder is main compliant today overall, hip pain is only present when he wakes up at night. Pain is worst with OH reaching and lifting of R shoulder, limiting overall function. Has some occasional N/T in L hand, has had carpal tunnel release in past. Notes that he had his L RTC repaired about 25 years ago, was supposed to also have R repaired but did not want to pursue surgery.   How did symptoms start: Chronic pain over last 25+ years with right shoulder  Average pain intensity:  Last 24 hours: 3/10  Past week: 5/10  How often does the pt experience symptoms? Constantly  How much have the symptoms interfered with usual daily activities? Quite a bit  How has condition changed since care began at this facility? NA - initial visit  In general, how is the patients overall health? Good   BACK PAIN (STarT Back Screening Tool) No     Harlene Persons, PTA 03/21/24 3:20 PM Phone: [phone] Fax: [phone]       "

## 2024-03-29 ENCOUNTER — Encounter: Payer: Self-pay | Admitting: Physical Therapy

## 2024-03-29 ENCOUNTER — Ambulatory Visit: Payer: Self-pay | Admitting: Family Medicine

## 2024-03-29 ENCOUNTER — Ambulatory Visit: Admitting: Physical Therapy

## 2024-03-29 DIAGNOSIS — M25511 Pain in right shoulder: Secondary | ICD-10-CM | POA: Diagnosis not present

## 2024-03-29 DIAGNOSIS — M6281 Muscle weakness (generalized): Secondary | ICD-10-CM

## 2024-03-29 DIAGNOSIS — G8929 Other chronic pain: Secondary | ICD-10-CM

## 2024-03-29 NOTE — Therapy (Signed)
 " OUTPATIENT PHYSICAL THERAPY TREATMENT  Progress Note Reporting Period 12/21/23 to 03/29/24  See note below for Objective Data and Assessment of Progress/Goals.   Patient Name: Jeremy Boone MRN: [id number] DOB:[birth year], 89 y.o., male Today's Date: 03/29/2024  END OF SESSION:  PT End of Session - 03/29/24 1454     Visit Number 10    Number of Visits 17    Date for Recertification  04/10/24    Authorization Type [employer] MCR- needs auth    Authorization Time Period 02/28/24-03/27/24; 8 more visits; 1/28- requested time extension    Authorization - Visit Number 4    Authorization - Number of Visits 8    PT Start Time 0252    PT Stop Time 0330    PT Time Calculation (min) 38 min               Past Medical History:  Diagnosis Date   Alcohol consumption of four to five drinks per day    Gout    Hyperlipidemia    Hypertension    OSA on CPAP    Prediabetes    Recurrent spontaneous pneumothorax 12/2022   Initial pneumothorax October 2024 treated with chest tubes; readmitted January 2024 current spontaneous pneumothorax   Past Surgical History:  Procedure Laterality Date   Cardiac Event Monitor  May-June 2017   Sinus rhythm with average heart rate 74.2 - no notable arrhythmias. Minimal PVCs. No PACs.   CARPAL TUNNEL RELEASE Left 2007   CATARACT EXTRACTION, BILATERAL     OTHER SURGICAL HISTORY  2007   Negative biopsy tonsil mass   ROTATOR CUFF REPAIR Left 2000   Patient Active Problem List   Diagnosis Date Noted   Adult wellness visit 01/25/2024   Immunization due 12/07/2023   Nasal congestion 09/06/2023   Right hip pain 09/06/2023   Allergic rhinitis 06/03/2023   BMI 24.0-24.9, adult 06/03/2023   Leg swelling 03/27/2023   Left carotid bruit 03/27/2023   Mild aortic stenosis by prior echocardiogram 12/25/2022   Spontaneous pneumothorax 12/24/2022   Demand ischemia (HCC) 12/24/2022   Alcohol use 12/24/2022   Insomnia 06/27/2020   Statin myopathy 11/21/2019    Constipation 12/15/2017   Palpitations 07/31/2015   Obstructive sleep apnea syndrome 04/19/2015   Vitamin D  deficiency 09/14/2013   Medication management 09/14/2013   History of colonic polyps 08/02/2013   Gastroesophageal reflux disease without esophagitis 08/02/2013   Hyperlipidemia, mixed    Essential hypertension    Idiopathic gout    Abnormal glucose     PCP: Alvia Corean CROME, FNP  REFERRING PROVIDER: Alvia Corean CROME, FNP  REFERRING DIAG:  [id number] (ICD-10-CM) - Chronic right shoulder pain M25.551 (ICD-10-CM) - Right hip pain  THERAPY DIAG:  Chronic right shoulder pain  Muscle weakness (generalized)  Rationale for Evaluation and Treatment: Rehabilitation  ONSET DATE: Chronic  SUBJECTIVE:  SUBJECTIVE STATEMENT: Still cannot raise my arm up unless I am laying back. Shoulder pain is reduced overall to 4/10.   EVAL: Pt presents to PT with reports of chronic R shoulder pain and recent onset of R hip pain. R shoulder is main compliant today overall, hip pain is only present when he wakes up at night. Pain is worst with OH reaching and lifting of R shoulder, limiting overall function. Has some occasional N/T in L hand, has had carpal tunnel release in past. Notes that he had his L RTC repaired about 25 years ago, was supposed to also have R repaired but did not want to pursue surgery.   Hand dominance: Right  PERTINENT HISTORY: HTN  PAIN:   6/10 Are you having pain?  Yes: NPRS scale: 0/10, no pain at rest  Worst: 4/10 Pain location: R shoulder Pain description: sharp, sore, tight Aggravating factors: OH reaching, lifting Relieving factors: rest  PRECAUTIONS: None  RED FLAGS: None   WEIGHT BEARING RESTRICTIONS: No  FALLS:  Has patient fallen in last 6 months? No    LIVING ENVIRONMENT: Lives with: lives alone Lives in: House/apartment Stairs: Yes: Internal: 10 steps; on right going up Has following equipment at home: Single point cane  OCCUPATION: Retired  PLOF: Independent with basic ADLs  PATIENT GOALS: patient wants to decrease difficulty with hygiene and dressing in regards to R shoulder  NEXT MD VISIT:   OBJECTIVE:  Note: Objective measures were completed at Evaluation unless otherwise noted.  DIAGNOSTIC FINDINGS:  N/A  PATIENT SURVEYS:  Quick Dash: 52.3% 02/28/24: 45.5%   COGNITION: Overall cognitive status: Within functional limits for tasks assessed     SENSATION: WFL  POSTURE: Rounded shoulders, forward head  UPPER EXTREMITY ROM:   Active ROM Right eval Left eval Right 02/02/24 Right 02/01/24 Right 03/29/24  Shoulder flexion 70 150 90A 120AA 90 standing 120 supine  95 seated scaption  Shoulder extension       Shoulder abduction    80   Shoulder adduction       Shoulder internal rotation       Shoulder external rotation 55 15     Elbow flexion       Elbow extension       Wrist flexion       Wrist extension       Wrist ulnar deviation       Wrist radial deviation       Wrist pronation       Wrist supination       (Blank rows = not tested)  UPPER EXTREMITY MMT:  MMT Right eval Left eval Right 02/28/24  Shoulder flexion 2+ 3+ 2+  Shoulder extension     Shoulder abduction     Shoulder adduction     Shoulder internal rotation 3 4 3   Shoulder external rotation 3 4 3   Middle trapezius     Lower trapezius     Elbow flexion     Elbow extension     Wrist flexion     Wrist extension     Wrist ulnar deviation     Wrist radial deviation     Wrist pronation     Wrist supination     Grip strength (lbs)     (Blank rows = not tested)  SHOULDER SPECIAL TESTS: Impingement tests: Painful arc test: positive   JOINT MOBILITY TESTING:  R GH hypmobility, decreased scapular elevation  PALPATION:  TTP to  R upper trap, slightly in  R infraspinatus and rhomboids   TREATMENT: [facility] Adult PT Treatment:                                                DATE: 03/29/24 UBE level 2 , 2.5 minutes each way  Pulleys seated ROW blue 15 x 2  Supine inclined shoulder flexion, long lever 2 x 10 Supine chest press to pullover with dowel  YTB shoulder ER iso 3 sec  YTB band hold at 90 with left arm diagonal pull down 2 x 10   [facility] Adult PT Treatment:                                                DATE: 03/21/24  UBE level 1 2,5 min each way  Pulleys  Standing cane shoulder scaption x 8 Standing cane Er AAROM  Seated row BTB 8  x 3  Supine chest press R 3# 8 x 3  Supine iso pull YTB 3 sec x 8 x 2  Supine shoulder flexion long lever x8, YTB x 8    SL shoulder ER x 8, 1# x 8      03/15/24 Neuromuscular Reeducation: HEP reassessment and update Shrug x8x3s Row blue TB 2x8x3s Red ER unable, yellow ER unable Standing ER x8x3s Sidelying ER 3x8x3s Supine cane press x8, x6 w/ 3# YTB wall slide with ER (palms facing each other) x6        PATIENT EDUCATION: Education details: HEP update Person educated: Patient Education method: Explanation, Demonstration, and Handouts Education comprehension: verbalized understanding and returned demonstration  HOME EXERCISE PROGRAM: Access Code: [email or web address] URL: [email or web address] Date: 03/15/2024 Prepared by: Washington Scot  Exercises - Seated Scapular Retraction  - 1 x daily - 7 x weekly - 3 sets - 10 reps - 3 sec hold - Scapular Retraction with Resistance  - 1 x daily - 7 x weekly - 3 sets - 10 reps - green band hold - Seated Shoulder Flexion Towel Slide at Table Top  - 1 x daily - 7 x weekly - 2 sets - 10 reps - 5 sec hold - Standing Isometric Shoulder External Rotation with Doorway and Towel Roll  - 1 x daily - 7 x weekly - 2 sets - 10 reps - 5 sec hold - Standing Isometric Shoulder Internal Rotation with Towel Roll at Doorway  - 1 x daily -  7 x weekly - 2 sets - 10 reps - 5 sec hold - Supine Shoulder Flexion Extension Full Range AROM  - 1 x daily - 7 x weekly - 2-3 sets - 10 reps - Sidelying Shoulder External Rotation AROM  - 2 x daily - 5 x weekly - 2 sets - 8 reps - 3 hold - Shoulder Flexion Wall Slide with Resistance Band  - 2 x daily - 5 x weekly - 2 sets - 4-6 reps - 3 hold       11/3  Red TB ER  Red TB IR  Green TB row  5x/wk 1-2x/day, 2x8x3s  ASSESSMENT:  CLINICAL IMPRESSION:  Patient reports lower level of pain intensity overall and has improved his shoulder elevation from 70 degrees to 95 degrees against gravity. He continues to require exercise modifications to  tolerate strengthening and AROM in a gravity reduced position. Was able to tolerate progression from supine to inclined today with min increase in shoulder pain.  Patient tolerated treatment with no significant increases in pain with progressions in R UE loading and stability. Current deficits include: RUE ROM, excessive pain, strength, and functional activity tolerance. As a result, patient would continue to benefit from skilled PT to address said deficits via plan below.     EVAL: Patient is a 89 y.o. M who was seen today for physical therapy evaluation and treatment for chronic R shoulder pain and discomfort. Also notes chronic R hip pain when waking up at night but states shoulder is chief compliant today. Physical findings are consistent with referring provider impression as pt demonstrates decrease in R shoulder AROM, R shoulder strength, and decrease in functional ability. Quick DASH demonstrates severe disability in performance of home ADLs and community activities. Pt would benefit from skilled PT services working on improving R shoulder strength and ROM in order to decrease pain and improve function.   OBJECTIVE IMPAIRMENTS: Abnormal gait, decreased activity tolerance, decreased balance, decreased endurance, decreased mobility, difficulty walking,  decreased ROM, decreased strength, impaired UE functional use, postural dysfunction, and pain  ACTIVITY LIMITATIONS: carrying, lifting, standing, squatting, stairs, transfers, dressing, reach over head, and hygiene/grooming  PARTICIPATION LIMITATIONS: meal prep, driving, shopping, community activity, and yard work  PERSONAL FACTORS: Time since onset of injury/illness/exacerbation are also affecting patient's functional outcome.   REHAB POTENTIAL: Good  CLINICAL DECISION MAKING: Evolving/moderate complexity  EVALUATION COMPLEXITY: Moderate  GOALS: Goals reviewed with patient? No  SHORT TERM GOALS: Target date: 01/11/2024   Pt will be compliant and knowledgeable with initial HEP for improved comfort and carryover Baseline: initial HEP given  Goal status: MET   2.  Pt will self report right shoulder pain no greater than 4/10 for improved comfort and functional ability Baseline: 6/10 at worst Goal status: MET    LONG TERM GOALS: Target date: 04/10/2024    Pt will decrease Quick DASH disability score to no greater than 40% as proxy for functional improvement Baseline: 52.3%  02/25/24: 45.5%  Goal status: ONGOING  2.  Pt will self report R shoulder pain no greater than 2/10 for improved comfort and functional ability Baseline: 6/10 at worst 02/28/24: 4-5/10  03/29/24: 4/10 Goal status: ONGOING   3.  Pt will improve R shoulder flexion AROM to at least 115 degrees for improved comfort and functional ability Baseline: 70 degrees 02/28/24: 85-90 degrees 03/29/24: 95 degrees Goal status: ONGOING  4.  Pt will improve R shoulder MMT to at least 3+/5 for all tested motions for improved comfort and function Baseline:  02/28/24: 2+/5-3-/5  Goal status: ONGOING   PLAN:  PT FREQUENCY: 1-2x/week  PT DURATION:  6 weeks   PLANNED INTERVENTIONS: 97164- PT Re-evaluation, 97110-Therapeutic exercises, 97530- Therapeutic activity, 97112- Neuromuscular re-education, 97535- Self Care,  97140- Manual therapy, [id number]- Gait training, [phone]- Electrical stimulation (unattended), [id number]- Electrical stimulation (manual), 02983- Vasopneumatic device, 20560 (1-2 muscles), 20561 (3+ muscles)- Dry Needling, and Patient/Family education  PLAN FOR NEXT SESSION: assess HEP response, periscapular strengthening, shoulder AAROM and AROM. Focus on R external rotation strengthening at full ROM.  Date of referral: 12/07/2023 Referring provider: Alvia Corean CROME, FNP  Referring diagnosis?  M25.511,G89.29 (ICD-10-CM) - Chronic right shoulder pain M25.551 (ICD-10-CM) - Right hip pain  Treatment diagnosis? (if different than referring diagnosis)  Chronic right shoulder pain Muscle weakness (generalized) Abnormal posture Pain in right hip Other abnormalities  of gait and mobility  What was this (referring dx) caused by? Ongoing Issue and Arthritis  Lysle of Condition: Chronic (continuous duration > 3 months)   Laterality: Rt  Current Functional Measure Score: DASH 52.3%  Objective measurements identify impairments when they are compared to normal values, the uninvolved extremity, and prior level of function.  [x]  Yes  []  No  Objective assessment of functional ability: Severe functional limitations   Briefly describe symptoms:  Pt presents to PT with reports of chronic R shoulder pain and recent onset of R hip pain. R shoulder is main compliant today overall, hip pain is only present when he wakes up at night. Pain is worst with OH reaching and lifting of R shoulder, limiting overall function. Has some occasional N/T in L hand, has had carpal tunnel release in past. Notes that he had his L RTC repaired about 25 years ago, was supposed to also have R repaired but did not want to pursue surgery.   How did symptoms start: Chronic pain over last 25+ years with right shoulder  Average pain intensity:  Last 24 hours: 4/10  Past week: 4/10  How often does the pt experience symptoms?  Constantly  How much have the symptoms interfered with usual daily activities? Quite a bit  How has condition changed since care began at this facility? NA - initial visit  In general, how is the patients overall health? Good   BACK PAIN (STarT Back Screening Tool) No     Harlene Persons, PTA 03/29/24 3:40 PM Phone: [phone] Fax: [phone]   Washington Odessia Scot  PT, DPT     "

## 2024-03-30 ENCOUNTER — Ambulatory Visit: Admitting: Audiology

## 2024-04-05 ENCOUNTER — Ambulatory Visit: Admitting: Physical Therapy

## 2024-04-05 ENCOUNTER — Encounter: Payer: Self-pay | Admitting: Physical Therapy

## 2024-04-05 DIAGNOSIS — M6281 Muscle weakness (generalized): Secondary | ICD-10-CM

## 2024-04-05 DIAGNOSIS — G8929 Other chronic pain: Secondary | ICD-10-CM

## 2024-04-05 NOTE — Therapy (Signed)
 " OUTPATIENT PHYSICAL THERAPY TREATMENT    Patient Name: Jeremy Boone MRN: [id number] DOB:01-Jan-1935, 89 y.o., male Today's Date: 04/05/2024  END OF SESSION:  PT End of Session - 04/05/24 1453     Visit Number 11    Number of Visits 17    Date for Recertification  04/10/24    Authorization Type [employer] MCR- needs auth    Authorization Time Period 02/28/24-03/27/24; 8 more visits; 1/28--05/09/24 3 more visits    Authorization - Visit Number 2    Authorization - Number of Visits 3    PT Start Time 0250    PT Stop Time 0330    PT Time Calculation (min) 40 min               Past Medical History:  Diagnosis Date   Alcohol consumption of four to five drinks per day    Gout    Hyperlipidemia    Hypertension    OSA on CPAP    Prediabetes    Recurrent spontaneous pneumothorax 12/2022   Initial pneumothorax October 2024 treated with chest tubes; readmitted January 2024 current spontaneous pneumothorax   Past Surgical History:  Procedure Laterality Date   Cardiac Event Monitor  May-June 2017   Sinus rhythm with average heart rate 74.2 - no notable arrhythmias. Minimal PVCs. No PACs.   CARPAL TUNNEL RELEASE Left 2007   CATARACT EXTRACTION, BILATERAL     OTHER SURGICAL HISTORY  2007   Negative biopsy tonsil mass   ROTATOR CUFF REPAIR Left 2000   Patient Active Problem List   Diagnosis Date Noted   Adult wellness visit 01/25/2024   Immunization due 12/07/2023   Nasal congestion 09/06/2023   Right hip pain 09/06/2023   Allergic rhinitis 06/03/2023   BMI 24.0-24.9, adult 06/03/2023   Leg swelling 03/27/2023   Left carotid bruit 03/27/2023   Mild aortic stenosis by prior echocardiogram 12/25/2022   Spontaneous pneumothorax 12/24/2022   Demand ischemia (HCC) 12/24/2022   Alcohol use 12/24/2022   Insomnia 06/27/2020   Statin myopathy 11/21/2019   Constipation 12/15/2017   Palpitations 07/31/2015   Obstructive sleep apnea syndrome 04/19/2015   Vitamin D  deficiency  09/14/2013   Medication management 09/14/2013   History of colonic polyps 08/02/2013   Gastroesophageal reflux disease without esophagitis 08/02/2013   Hyperlipidemia, mixed    Essential hypertension    Idiopathic gout    Abnormal glucose     PCP: Alvia Corean CROME, FNP  REFERRING PROVIDER: Alvia Corean CROME, FNP  REFERRING DIAG:  [phone] (ICD-10-CM) - Chronic right shoulder pain M25.551 (ICD-10-CM) - Right hip pain  THERAPY DIAG:  Chronic right shoulder pain  Muscle weakness (generalized)  Rationale for Evaluation and Treatment: Rehabilitation  ONSET DATE: Chronic  SUBJECTIVE:  SUBJECTIVE STATEMENT: Some things feel better and stronger.   EVAL: Pt presents to PT with reports of chronic R shoulder pain and recent onset of R hip pain. R shoulder is main compliant today overall, hip pain is only present when he wakes up at night. Pain is worst with OH reaching and lifting of R shoulder, limiting overall function. Has some occasional N/T in L hand, has had carpal tunnel release in past. Notes that he had his L RTC repaired about 25 years ago, was supposed to also have R repaired but did not want to pursue surgery.   Hand dominance: Right  PERTINENT HISTORY: HTN  PAIN:   6/10 Are you having pain?  Yes: NPRS scale: 0/10, no pain at rest  Worst: 4/10 Pain location: R shoulder Pain description: sharp, sore, tight Aggravating factors: OH reaching, lifting Relieving factors: rest  PRECAUTIONS: None  RED FLAGS: None   WEIGHT BEARING RESTRICTIONS: No  FALLS:  Has patient fallen in last 6 months? No   LIVING ENVIRONMENT: Lives with: lives alone Lives in: House/apartment Stairs: Yes: Internal: 10 steps; on right going up Has following equipment at home: Single point  cane  OCCUPATION: Retired  PLOF: Independent with basic ADLs  PATIENT GOALS: patient wants to decrease difficulty with hygiene and dressing in regards to R shoulder  NEXT MD VISIT:   OBJECTIVE:  Note: Objective measures were completed at Evaluation unless otherwise noted.  DIAGNOSTIC FINDINGS:  N/A  PATIENT SURVEYS:  Quick Dash: 52.3% 02/28/24: 45.5%   COGNITION: Overall cognitive status: Within functional limits for tasks assessed     SENSATION: WFL  POSTURE: Rounded shoulders, forward head  UPPER EXTREMITY ROM:   Active ROM Right eval Left eval Right 02/02/24 Right 02/01/24 Right 03/29/24  Shoulder flexion 70 150 90A 120AA 90 standing 120 supine  95 seated scaption  Shoulder extension       Shoulder abduction    80   Shoulder adduction       Shoulder internal rotation       Shoulder external rotation 55 15     Elbow flexion       Elbow extension       Wrist flexion       Wrist extension       Wrist ulnar deviation       Wrist radial deviation       Wrist pronation       Wrist supination       (Blank rows = not tested)  UPPER EXTREMITY MMT:  MMT Right eval Left eval Right 02/28/24  Shoulder flexion 2+ 3+ 2+  Shoulder extension     Shoulder abduction     Shoulder adduction     Shoulder internal rotation 3 4 3   Shoulder external rotation 3 4 3   Middle trapezius     Lower trapezius     Elbow flexion     Elbow extension     Wrist flexion     Wrist extension     Wrist ulnar deviation     Wrist radial deviation     Wrist pronation     Wrist supination     Grip strength (lbs)     (Blank rows = not tested)  SHOULDER SPECIAL TESTS: Impingement tests: Painful arc test: positive   JOINT MOBILITY TESTING:  R GH hypmobility, decreased scapular elevation  PALPATION:  TTP to R upper trap, slightly in R infraspinatus and rhomboids   TREATMENT: [facility] Adult PT Treatment:  DATE: 04/05/34 UBE level 2 ,  2.5 minutes each way  Pulleys  Finger ladder x 5 flexion Standing  ROW blue 15 x 2  Supine inclined shoulder flexion, long lever 2 x 10 Supine full pullovers with dowel 8 x2  YTB shoulder ER iso 3 sec 8 x 3 RTB band hold at 90 with left arm diagonal pull down 1 x 8, then 1 x 8 shoulder flexion  Supine iso eE 3 sec x 8- therapist providing manual resist Supine iso IR 3 sec x 8 - therapist providing manual resist     [facility] Adult PT Treatment:                                                DATE: 03/29/24 UBE level 2 , 2.5 minutes each way  Pulleys  seated ROW blue 15 x 2  Supine inclined shoulder flexion, long lever 2 x 10 Supine chest press to pullover with dowel  YTB shoulder ER iso 3 sec  YTB band hold at 90 with left arm diagonal pull down 2 x 10   [facility] Adult PT Treatment:                                                DATE: 03/21/24  UBE level 1 2,5 min each way  Pulleys  Standing cane shoulder scaption x 8 Standing cane Er AAROM  Seated row BTB 8  x 3  Supine chest press R 3# 8 x 3  Supine iso pull YTB 3 sec x 8 x 2  Supine shoulder flexion long lever x8, YTB x 8    SL shoulder ER x 8, 1# x 8      03/15/24 Neuromuscular Reeducation: HEP reassessment and update Shrug x8x3s Row blue TB 2x8x3s Red ER unable, yellow ER unable Standing ER x8x3s Sidelying ER 3x8x3s Supine cane press x8, x6 w/ 3# YTB wall slide with ER (palms facing each other) x6        PATIENT EDUCATION: Education details: HEP update Person educated: Patient Education method: Explanation, Demonstration, and Handouts Education comprehension: verbalized understanding and returned demonstration  HOME EXERCISE PROGRAM: Access Code: [email or web address] URL: [email or web address] Date: 03/15/2024 Prepared by: Washington Scot  Exercises - Seated Scapular Retraction  - 1 x daily - 7 x weekly - 3 sets - 10 reps - 3 sec hold - Scapular Retraction with Resistance  - 1 x daily - 7 x weekly - 3 sets -  10 reps - green band hold - Seated Shoulder Flexion Towel Slide at Table Top  - 1 x daily - 7 x weekly - 2 sets - 10 reps - 5 sec hold - Standing Isometric Shoulder External Rotation with Doorway and Towel Roll  - 1 x daily - 7 x weekly - 2 sets - 10 reps - 5 sec hold - Standing Isometric Shoulder Internal Rotation with Towel Roll at Doorway  - 1 x daily - 7 x weekly - 2 sets - 10 reps - 5 sec hold - Supine Shoulder Flexion Extension Full Range AROM  - 1 x daily - 7 x weekly - 2-3 sets - 10 reps - Sidelying Shoulder External Rotation AROM  - 2 x daily - 5  x weekly - 2 sets - 8 reps - 3 hold - Shoulder Flexion Wall Slide with Resistance Band  - 2 x daily - 5 x weekly - 2 sets - 4-6 reps - 3 hold added - Shoulder External Rotation and Scapular Retraction with Resistance  - 1 x daily - 7 x weekly - 3 sets - 8 reps - 3 hold       11/3  Red TB ER  Red TB IR  Green TB row  5x/wk 1-2x/day, 2x8x3s  ASSESSMENT:  CLINICAL IMPRESSION:  Patient reports lower level of pain intensity overall and was able to shovel snow but was limited by back pain. Has improved his shoulder elevation from 70 degrees to 95 degrees against gravity. He continues to require exercise modifications to tolerate strengthening and AROM in a gravity reduced position. Re-eval next visit.      EVAL: Patient is a 89 y.o. M who was seen today for physical therapy evaluation and treatment for chronic R shoulder pain and discomfort. Also notes chronic R hip pain when waking up at night but states shoulder is chief compliant today. Physical findings are consistent with referring provider impression as pt demonstrates decrease in R shoulder AROM, R shoulder strength, and decrease in functional ability. Quick DASH demonstrates severe disability in performance of home ADLs and community activities. Pt would benefit from skilled PT services working on improving R shoulder strength and ROM in order to decrease pain and improve function.    OBJECTIVE IMPAIRMENTS: Abnormal gait, decreased activity tolerance, decreased balance, decreased endurance, decreased mobility, difficulty walking, decreased ROM, decreased strength, impaired UE functional use, postural dysfunction, and pain  ACTIVITY LIMITATIONS: carrying, lifting, standing, squatting, stairs, transfers, dressing, reach over head, and hygiene/grooming  PARTICIPATION LIMITATIONS: meal prep, driving, shopping, community activity, and yard work  PERSONAL FACTORS: Time since onset of injury/illness/exacerbation are also affecting patient's functional outcome.   REHAB POTENTIAL: Good  CLINICAL DECISION MAKING: Evolving/moderate complexity  EVALUATION COMPLEXITY: Moderate  GOALS: Goals reviewed with patient? No  SHORT TERM GOALS: Target date: 01/11/2024   Pt will be compliant and knowledgeable with initial HEP for improved comfort and carryover Baseline: initial HEP given  Goal status: MET   2.  Pt will self report right shoulder pain no greater than 4/10 for improved comfort and functional ability Baseline: 6/10 at worst Goal status: MET    LONG TERM GOALS: Target date: 04/10/2024    Pt will decrease Quick DASH disability score to no greater than 40% as proxy for functional improvement Baseline: 52.3%  02/25/24: 45.5%  Goal status: ONGOING  2.  Pt will self report R shoulder pain no greater than 2/10 for improved comfort and functional ability Baseline: 6/10 at worst 02/28/24: 4-5/10  03/29/24: 4/10 Goal status: ONGOING   3.  Pt will improve R shoulder flexion AROM to at least 115 degrees for improved comfort and functional ability Baseline: 70 degrees 02/28/24: 85-90 degrees 03/29/24: 95 degrees Goal status: ONGOING  4.  Pt will improve R shoulder MMT to at least 3+/5 for all tested motions for improved comfort and function Baseline:  02/28/24: 2+/5-3-/5  Goal status: ONGOING   PLAN:  PT FREQUENCY: 1-2x/week  PT DURATION:  6 weeks   PLANNED  INTERVENTIONS: 97164- PT Re-evaluation, 97110-Therapeutic exercises, 97530- Therapeutic activity, 97112- Neuromuscular re-education, 97535- Self Care, 02859- Manual therapy, [id number]- Gait training, [phone]- Electrical stimulation (unattended), [id number]- Electrical stimulation (manual), 97016- Vasopneumatic device, 79439 (1-2 muscles), 20561 (3+ muscles)- Dry Needling, and Patient/Family education  PLAN FOR NEXT SESSION: assess HEP response, periscapular strengthening, shoulder AAROM and AROM. Focus on R external rotation strengthening at full ROM.  Date of referral: 12/07/2023 Referring provider: Alvia Corean CROME, FNP  Referring diagnosis?  M25.511,G89.29 (ICD-10-CM) - Chronic right shoulder pain M25.551 (ICD-10-CM) - Right hip pain  Treatment diagnosis? (if different than referring diagnosis)  Chronic right shoulder pain Muscle weakness (generalized) Abnormal posture Pain in right hip Other abnormalities of gait and mobility  What was this (referring dx) caused by? Ongoing Issue and Arthritis  Lysle of Condition: Chronic (continuous duration > 3 months)   Laterality: Rt  Current Functional Measure Score: DASH 52.3%  Objective measurements identify impairments when they are compared to normal values, the uninvolved extremity, and prior level of function.  [x]  Yes  []  No  Objective assessment of functional ability: Severe functional limitations   Briefly describe symptoms:  Pt presents to PT with reports of chronic R shoulder pain and recent onset of R hip pain. R shoulder is main compliant today overall, hip pain is only present when he wakes up at night. Pain is worst with OH reaching and lifting of R shoulder, limiting overall function. Has some occasional N/T in L hand, has had carpal tunnel release in past. Notes that he had his L RTC repaired about 25 years ago, was supposed to also have R repaired but did not want to pursue surgery.   How did symptoms start: Chronic pain over  last 25+ years with right shoulder  Average pain intensity:  Last 24 hours: 4/10  Past week: 4/10  How often does the pt experience symptoms? Constantly  How much have the symptoms interfered with usual daily activities? Quite a bit  How has condition changed since care began at this facility? NA - initial visit  In general, how is the patients overall health? Good   BACK PAIN (STarT Back Screening Tool) No     Harlene Persons, PTA 04/05/24 3:39 PM Phone: [phone] Fax: [phone]        "

## 2024-04-12 ENCOUNTER — Ambulatory Visit: Payer: Self-pay

## 2024-04-19 ENCOUNTER — Ambulatory Visit: Payer: Self-pay

## 2024-04-26 ENCOUNTER — Ambulatory Visit: Payer: Self-pay | Admitting: Physical Therapy

## 2025-03-16 ENCOUNTER — Ambulatory Visit

## 2025-03-22 ENCOUNTER — Encounter: Admitting: Family Medicine
